# Patient Record
Sex: Female | Born: 1970
Health system: Southern US, Community
[De-identification: ages and names within clinical notes are randomized; demographics above are authoritative.]

## PROBLEM LIST (undated history)

## (undated) DIAGNOSIS — K219 Gastro-esophageal reflux disease without esophagitis: Secondary | ICD-10-CM

## (undated) DIAGNOSIS — B019 Varicella without complication: Secondary | ICD-10-CM

## (undated) DIAGNOSIS — I10 Essential (primary) hypertension: Secondary | ICD-10-CM

## (undated) DIAGNOSIS — N390 Urinary tract infection, site not specified: Secondary | ICD-10-CM

## (undated) DIAGNOSIS — E785 Hyperlipidemia, unspecified: Secondary | ICD-10-CM

## (undated) HISTORY — DX: Gastro-esophageal reflux disease without esophagitis: K21.9

## (undated) HISTORY — DX: Urinary tract infection, site not specified: N39.0

## (undated) HISTORY — DX: Essential (primary) hypertension: I10

## (undated) HISTORY — PX: OTHER SURGICAL HISTORY: SHX169

## (undated) HISTORY — PX: ORIF FEMUR FRACTURE: SHX2119

## (undated) HISTORY — DX: Varicella without complication: B01.9

## (undated) HISTORY — DX: Hyperlipidemia, unspecified: E78.5

## (undated) HISTORY — PX: TUBAL LIGATION: SHX77

---

## 1991-03-24 HISTORY — PX: CHOLECYSTECTOMY: SHX55

## 2003-01-27 ENCOUNTER — Other Ambulatory Visit: Payer: Self-pay

## 2004-10-27 ENCOUNTER — Encounter: Payer: Self-pay | Admitting: Internal Medicine

## 2004-11-21 ENCOUNTER — Encounter: Payer: Self-pay | Admitting: Internal Medicine

## 2004-12-21 ENCOUNTER — Encounter: Payer: Self-pay | Admitting: Internal Medicine

## 2005-01-21 ENCOUNTER — Encounter: Payer: Self-pay | Admitting: Internal Medicine

## 2005-02-20 ENCOUNTER — Ambulatory Visit: Payer: Self-pay

## 2005-03-23 HISTORY — PX: ABLATION: SHX5711

## 2005-04-09 ENCOUNTER — Ambulatory Visit: Payer: Self-pay | Admitting: Obstetrics and Gynecology

## 2005-08-07 ENCOUNTER — Ambulatory Visit: Payer: Self-pay

## 2006-01-12 ENCOUNTER — Ambulatory Visit: Payer: Self-pay | Admitting: Internal Medicine

## 2006-04-08 ENCOUNTER — Encounter: Payer: Self-pay | Admitting: General Practice

## 2006-12-11 ENCOUNTER — Ambulatory Visit: Payer: Self-pay | Admitting: Internal Medicine

## 2007-06-14 ENCOUNTER — Ambulatory Visit: Payer: Self-pay | Admitting: Internal Medicine

## 2010-03-03 ENCOUNTER — Ambulatory Visit: Payer: Self-pay | Admitting: Internal Medicine

## 2010-03-11 ENCOUNTER — Ambulatory Visit: Payer: Self-pay | Admitting: Internal Medicine

## 2010-07-04 LAB — CBC AND DIFFERENTIAL: Neutrophils Absolute: 67 /uL

## 2010-07-04 LAB — BASIC METABOLIC PANEL
BUN: 13 mg/dL (ref 4–21)
Creatinine: 0.7 mg/dL (ref 0.5–1.1)
Sodium: 140 mmol/L (ref 137–147)

## 2010-07-04 LAB — LIPID PANEL
LDL Cholesterol: 151 mg/dL
Triglycerides: 150 mg/dL (ref 40–160)

## 2010-07-04 LAB — HEPATIC FUNCTION PANEL: Bilirubin, Total: 0.5 mg/dL

## 2010-11-10 ENCOUNTER — Encounter: Payer: Self-pay | Admitting: Internal Medicine

## 2010-11-10 DIAGNOSIS — E1110 Type 2 diabetes mellitus with ketoacidosis without coma: Secondary | ICD-10-CM | POA: Insufficient documentation

## 2010-11-10 DIAGNOSIS — E785 Hyperlipidemia, unspecified: Secondary | ICD-10-CM

## 2010-11-10 DIAGNOSIS — E119 Type 2 diabetes mellitus without complications: Secondary | ICD-10-CM | POA: Insufficient documentation

## 2010-11-14 ENCOUNTER — Encounter: Payer: Self-pay | Admitting: Internal Medicine

## 2010-11-26 ENCOUNTER — Ambulatory Visit: Payer: Self-pay | Admitting: Internal Medicine

## 2010-11-26 DIAGNOSIS — Z0289 Encounter for other administrative examinations: Secondary | ICD-10-CM

## 2011-03-18 ENCOUNTER — Other Ambulatory Visit: Payer: Self-pay | Admitting: *Deleted

## 2011-03-18 MED ORDER — RANITIDINE HCL 300 MG PO CAPS: 300.0000 mg | ORAL_CAPSULE | Freq: Every evening | ORAL | Status: DC

## 2011-03-18 MED ORDER — CARVEDILOL 3.125 MG PO TABS: 3.1250 mg | ORAL_TABLET | Freq: Two times a day (BID) | ORAL | Status: DC

## 2011-03-18 MED ORDER — QUINAPRIL HCL 20 MG PO TABS: 20.0000 mg | ORAL_TABLET | Freq: Every day | ORAL | Status: DC

## 2011-04-24 ENCOUNTER — Ambulatory Visit: Payer: Self-pay | Admitting: Internal Medicine

## 2011-04-24 LAB — HM PAP SMEAR: HM Pap smear: NORMAL

## 2011-05-07 ENCOUNTER — Ambulatory Visit: Payer: Self-pay | Admitting: Internal Medicine

## 2011-05-07 DIAGNOSIS — Z0289 Encounter for other administrative examinations: Secondary | ICD-10-CM

## 2011-07-09 ENCOUNTER — Emergency Department: Payer: Self-pay | Admitting: Emergency Medicine

## 2011-07-09 LAB — CBC
MCH: 29.3 pg (ref 26.0–34.0)
Platelet: 272 10*3/uL (ref 150–440)
RBC: 4.99 10*6/uL (ref 3.80–5.20)

## 2011-07-09 LAB — URINALYSIS, COMPLETE
Bacteria: NONE SEEN
Bilirubin,UR: NEGATIVE
Ketone: NEGATIVE
Nitrite: NEGATIVE
Protein: NEGATIVE
Specific Gravity: 1.022 (ref 1.003–1.030)
WBC UR: <1 /HPF (ref 0–5)

## 2011-07-09 LAB — COMPREHENSIVE METABOLIC PANEL
Albumin: 4.5 g/dL (ref 3.4–5.0)
Bilirubin,Total: 0.9 mg/dL (ref 0.2–1.0)
Creatinine: 0.78 mg/dL (ref 0.60–1.30)
EGFR (Non-African Amer.): >60
Glucose: 252 mg/dL — ABNORMAL HIGH (ref 65–99)
SGOT(AST): 25 U/L (ref 15–37)
Sodium: 134 mmol/L — ABNORMAL LOW (ref 136–145)
Total Protein: 8.6 g/dL — ABNORMAL HIGH (ref 6.4–8.2)

## 2011-07-09 LAB — TROPONIN I: Troponin-I: <0.02 ng/mL

## 2011-11-26 ENCOUNTER — Ambulatory Visit: Payer: Self-pay | Admitting: Internal Medicine

## 2011-11-26 DIAGNOSIS — Z0289 Encounter for other administrative examinations: Secondary | ICD-10-CM

## 2011-11-29 ENCOUNTER — Emergency Department: Payer: Self-pay | Admitting: Internal Medicine

## 2011-11-29 LAB — CBC
HCT: 41.3 % (ref 35.0–47.0)
HGB: 14.1 g/dL (ref 12.0–16.0)
MCH: 30.2 pg (ref 26.0–34.0)
MCHC: 34.2 g/dL (ref 32.0–36.0)
Platelet: 233 10*3/uL (ref 150–440)
RBC: 4.69 10*6/uL (ref 3.80–5.20)
WBC: 13.9 10*3/uL — ABNORMAL HIGH (ref 3.6–11.0)

## 2011-11-29 LAB — COMPREHENSIVE METABOLIC PANEL
Albumin: 3.9 g/dL (ref 3.4–5.0)
Anion Gap: 9 (ref 7–16)
Bilirubin,Total: 0.6 mg/dL (ref 0.2–1.0)
Chloride: 104 mmol/L (ref 98–107)
Co2: 25 mmol/L (ref 21–32)
Creatinine: 0.69 mg/dL (ref 0.60–1.30)
EGFR (African American): >60
EGFR (Non-African Amer.): >60
Osmolality: 279 (ref 275–301)
Sodium: 138 mmol/L (ref 136–145)
Total Protein: 8 g/dL (ref 6.4–8.2)

## 2011-11-29 LAB — CK TOTAL AND CKMB (NOT AT ARMC)
CK, Total: 85 U/L (ref 21–215)
CK-MB: <0.5 ng/mL — ABNORMAL LOW (ref 0.5–3.6)

## 2011-11-30 LAB — URINALYSIS, COMPLETE
Bilirubin,UR: NEGATIVE
Blood: NEGATIVE
Glucose,UR: 50 mg/dL (ref 0–75)
Leukocyte Esterase: NEGATIVE
Nitrite: NEGATIVE
Ph: 5 (ref 4.5–8.0)
Protein: NEGATIVE
RBC,UR: 1 /HPF (ref 0–5)
Specific Gravity: 1.03 (ref 1.003–1.030)
Squamous Epithelial: 11
WBC UR: 6 /HPF (ref 0–5)

## 2011-11-30 LAB — TROPONIN I: Troponin-I: <0.02 ng/mL

## 2011-12-01 LAB — URINE CULTURE

## 2012-01-22 ENCOUNTER — Encounter: Payer: Self-pay | Admitting: Internal Medicine

## 2012-01-22 ENCOUNTER — Ambulatory Visit (INDEPENDENT_AMBULATORY_CARE_PROVIDER_SITE_OTHER): Payer: PRIVATE HEALTH INSURANCE | Admitting: Internal Medicine

## 2012-01-22 ENCOUNTER — Telehealth: Payer: Self-pay | Admitting: Internal Medicine

## 2012-01-22 VITALS — BP 120/82 | HR 87 | Temp 98.3°F | Ht 70.0 in | Wt 247.6 lb

## 2012-01-22 DIAGNOSIS — R300 Dysuria: Secondary | ICD-10-CM

## 2012-01-22 DIAGNOSIS — Z1239 Encounter for other screening for malignant neoplasm of breast: Secondary | ICD-10-CM

## 2012-01-22 DIAGNOSIS — E785 Hyperlipidemia, unspecified: Secondary | ICD-10-CM

## 2012-01-22 DIAGNOSIS — E119 Type 2 diabetes mellitus without complications: Secondary | ICD-10-CM

## 2012-01-22 LAB — POCT URINALYSIS DIPSTICK
Bilirubin, UA: NEGATIVE
Leukocytes, UA: NEGATIVE
Nitrite, UA: NEGATIVE

## 2012-01-22 NOTE — Telephone Encounter (Signed)
I have reviewed CT of the chest from 11/30/2011. CT was normal except for mild changes in the bilateral lungs consistent with atelectasis (poor inspiration during exam), or possible infection at the time. Nothing to explain "twitching" sensation.

## 2012-01-23 DIAGNOSIS — R300 Dysuria: Secondary | ICD-10-CM | POA: Insufficient documentation

## 2012-01-23 NOTE — Assessment & Plan Note (Signed)
Blood sugars well controlled per pt. Followed by employee health at [facility] and A1c typically 6.5-7%.  Encouraged continued efforts at BG control. Will continue current medications. Follow up 3 months.

## 2012-01-23 NOTE — Progress Notes (Signed)
Subjective:    Patient ID: Julia Brown, female    DOB: Jun 24, 1970, 41 y.o.   MRN: [id number]  HPI 41 year old female with history of diabetes, hypertension, hyperlipidemia presents for followup. She has been lost to followup for over one year. Her diabetes has been followed by employee health at [facility]. She reports she has been compliant with medications an A1c is typically been near 7%. She has tried to follow a healthy diet and maintain physical activity. Her other concern today is some intermittent episodes of dysuria and increased urinary frequency. She denies any fever, chills, flank pain.  Outpatient Encounter Prescriptions as of 01/22/2012  Medication Sig Dispense Refill  . carvedilol (COREG) 3.125 MG tablet Take 1 tablet (3.125 mg total) by mouth 2 (two) times daily with a meal.  180 tablet  1  . Insulin Glargine (LANTUS SOLOSTAR [place]) Inject 50 Units into the skin daily. 40      . Liraglutide (VICTOZA) 18 MG/3ML SOLN Inject 1.8 mLs into the skin daily.      . metFORMIN (GLUCOPHAGE) 500 MG tablet Take 500 mg by mouth 2 (two) times daily with a meal.        . quinapril (ACCUPRIL) 20 MG tablet Take 1 tablet (20 mg total) by mouth at bedtime.  90 tablet  1  . ranitidine (ZANTAC) 300 MG capsule Take 1 capsule (300 mg total) by mouth every evening.  90 capsule  1  . OMEGA 3 1000 MG CAPS Take 1,000 mg by mouth. 1 qam, 2qpm       . DISCONTD: exenatide (BYETTA 10 MCG PEN) 10 MCG/0.04ML SOLN Inject into the skin 2 (two) times daily with a meal.         BP 120/82  Pulse 87  Temp 98.3 F (36.8 C) (Oral)  Ht 5\' 10"  (1.778 m)  Wt 247 lb 10 oz (112.322 kg)  BMI 35.53 kg/m2  SpO2 99%  LMP 01/22/2011  Review of Systems  Constitutional: Negative for fever, chills, appetite change, fatigue and unexpected weight change.  HENT: Negative for ear pain, congestion, sore throat, trouble swallowing, neck pain, voice change and sinus pressure.   Eyes: Negative for visual disturbance.    Respiratory: Negative for cough, shortness of breath, wheezing and stridor.   Cardiovascular: Negative for chest pain, palpitations and leg swelling.  Gastrointestinal: Negative for nausea, vomiting, abdominal pain, diarrhea, constipation, blood in stool, abdominal distention and anal bleeding.  Genitourinary: Positive for dysuria and urgency. Negative for flank pain.  Musculoskeletal: Negative for myalgias, arthralgias and gait problem.  Skin: Negative for color change and rash.  Neurological: Negative for dizziness and headaches.  Hematological: Negative for adenopathy. Does not bruise/bleed easily.  Psychiatric/Behavioral: Negative for suicidal ideas, disturbed wake/sleep cycle and dysphoric mood. The patient is not nervous/anxious.        Objective:   Physical Exam  Constitutional: She is oriented to person, place, and time. She appears well-developed and well-nourished. No distress.  HENT:  Head: Normocephalic and atraumatic.  Right Ear: External ear normal.  Left Ear: External ear normal.  Nose: Nose normal.  Mouth/Throat: Oropharynx is clear and moist. No oropharyngeal exudate.  Eyes: Conjunctivae normal are normal. Pupils are equal, round, and reactive to light. Right eye exhibits no discharge. Left eye exhibits no discharge. No scleral icterus.  Neck: Normal range of motion. Neck supple. No tracheal deviation present. No thyromegaly present.  Cardiovascular: Normal rate, regular rhythm, normal heart sounds and intact distal pulses.  Exam reveals no  gallop and no friction rub.   No murmur heard. Pulmonary/Chest: Effort normal and breath sounds normal. No respiratory distress. She has no wheezes. She has no rales. She exhibits no tenderness.  Abdominal: Soft. Bowel sounds are normal. She exhibits no distension. There is no tenderness.  Musculoskeletal: Normal range of motion. She exhibits no edema and no tenderness.  Lymphadenopathy:    She has no cervical adenopathy.   Neurological: She is alert and oriented to person, place, and time. No cranial nerve deficit. She exhibits normal muscle tone. Coordination normal.  Skin: Skin is warm and dry. No rash noted. She is not diaphoretic. No erythema. No pallor.  Psychiatric: She has a normal mood and affect. Her behavior is normal. Judgment and thought content normal.          Assessment & Plan:

## 2012-01-23 NOTE — Assessment & Plan Note (Signed)
Patient notes occasional urinary frequency. Urinalysis today is negative. However, will send urine for culture.

## 2012-01-23 NOTE — Assessment & Plan Note (Signed)
Patient has been intolerant of statin medications. Recent cholesterol performed at [facility] showed improvement. Encourage continued efforts at healthy diet and regular physical activity.

## 2012-01-24 LAB — URINE CULTURE: Colony Count: 60000

## 2012-01-29 ENCOUNTER — Other Ambulatory Visit: Payer: PRIVATE HEALTH INSURANCE

## 2012-03-28 ENCOUNTER — Telehealth: Payer: Self-pay | Admitting: Internal Medicine

## 2012-03-28 NOTE — Telephone Encounter (Signed)
Left message on cell phone voicemail for patient to return call. 

## 2012-03-28 NOTE — Telephone Encounter (Signed)
Needing samples

## 2012-03-29 NOTE — Telephone Encounter (Signed)
Patient advised we have no samples of Lantus at this time.

## 2012-04-18 ENCOUNTER — Encounter: Payer: Self-pay | Admitting: Internal Medicine

## 2012-04-18 ENCOUNTER — Ambulatory Visit (INDEPENDENT_AMBULATORY_CARE_PROVIDER_SITE_OTHER): Payer: 59 | Admitting: Internal Medicine

## 2012-04-18 ENCOUNTER — Other Ambulatory Visit (HOSPITAL_COMMUNITY)
Admission: RE | Admit: 2012-04-18 | Discharge: 2012-04-18 | Disposition: A | Discharge status: Home / Self Care | Payer: 59 | Source: Ambulatory Visit | Attending: Internal Medicine | Admitting: Internal Medicine

## 2012-04-18 VITALS — BP 126/80 | HR 84 | Temp 97.8°F | Ht 69.75 in | Wt 246.0 lb

## 2012-04-18 DIAGNOSIS — Z1151 Encounter for screening for human papillomavirus (HPV): Secondary | ICD-10-CM | POA: Insufficient documentation

## 2012-04-18 DIAGNOSIS — Z0000 Encounter for general adult medical examination without abnormal findings: Secondary | ICD-10-CM | POA: Insufficient documentation

## 2012-04-18 DIAGNOSIS — R1012 Left upper quadrant pain: Secondary | ICD-10-CM

## 2012-04-18 DIAGNOSIS — Z01419 Encounter for gynecological examination (general) (routine) without abnormal findings: Secondary | ICD-10-CM | POA: Insufficient documentation

## 2012-04-18 DIAGNOSIS — E119 Type 2 diabetes mellitus without complications: Secondary | ICD-10-CM

## 2012-04-18 NOTE — Assessment & Plan Note (Signed)
Symptoms most consistent with ventral wall hernia, however nodular area palpated just below costal margin left chest wall. Will get [place] for evaluation.
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
## 2012-04-18 NOTE — Assessment & Plan Note (Signed)
Will check A1c with labs, drawn through employer. Continue current medications. Follow up 3 months and prn.

## 2012-04-18 NOTE — Assessment & Plan Note (Signed)
General medical exam normal today including breast and pelvic exam. PAP pending. Will check basic labs including CBC CMP, lipids, A1c with labs through her employer. Vaccinations UTD. Encouraged healthy diet and regular physical activity. Follow up 3 months and prn.

## 2012-04-19 ENCOUNTER — Ambulatory Visit: Payer: Self-pay | Admitting: Internal Medicine

## 2012-04-20 ENCOUNTER — Other Ambulatory Visit: Payer: Self-pay | Admitting: *Deleted

## 2012-04-20 ENCOUNTER — Telehealth: Payer: Self-pay | Admitting: Internal Medicine

## 2012-04-20 DIAGNOSIS — R1012 Left upper quadrant pain: Secondary | ICD-10-CM

## 2012-04-20 MED ORDER — RANITIDINE HCL 300 MG PO CAPS: 300.0000 mg | ORAL_CAPSULE | Freq: Every evening | ORAL | Status: DC

## 2012-04-20 MED ORDER — CARVEDILOL 3.125 MG PO TABS: 3.1250 mg | ORAL_TABLET | Freq: Two times a day (BID) | ORAL | Status: DC

## 2012-04-20 MED ORDER — QUINAPRIL HCL 20 MG PO TABS: 20.0000 mg | ORAL_TABLET | Freq: Every day | ORAL | Status: DC

## 2012-04-20 NOTE — Telephone Encounter (Signed)
US abdomen normal. Rec proceed with CT Chest and abdomen for further evaluation.

## 2012-04-21 NOTE — Telephone Encounter (Signed)
I will place order for CT chest and abdomen, can you please schedule?

## 2012-04-21 NOTE — Telephone Encounter (Signed)
Spoke with patient and advised results, pt would like to proceed with CT, please advise

## 2012-04-21 NOTE — Addendum Note (Signed)
Addended by: Ronna Polio A on: 04/21/2012 03:53 PM   Modules accepted: Orders

## 2012-04-27 ENCOUNTER — Encounter: Payer: Self-pay | Admitting: Internal Medicine

## 2012-06-12 LAB — HM DIABETES EYE EXAM: HM Diabetic Eye Exam: NORMAL

## 2012-06-12 LAB — HM DIABETES FOOT EXAM: HM Diabetic Foot Exam: NORMAL

## 2012-07-19 ENCOUNTER — Ambulatory Visit: Payer: 59 | Admitting: Internal Medicine

## 2012-07-28 LAB — HM MAMMOGRAPHY

## 2012-08-10 ENCOUNTER — Ambulatory Visit: Payer: Self-pay | Admitting: Internal Medicine

## 2012-08-12 ENCOUNTER — Ambulatory Visit (INDEPENDENT_AMBULATORY_CARE_PROVIDER_SITE_OTHER): Payer: 59 | Admitting: Internal Medicine

## 2012-08-12 ENCOUNTER — Encounter: Payer: Self-pay | Admitting: Internal Medicine

## 2012-08-12 VITALS — BP 140/98 | HR 94 | Temp 98.4°F | Wt 248.0 lb

## 2012-08-12 DIAGNOSIS — K219 Gastro-esophageal reflux disease without esophagitis: Secondary | ICD-10-CM | POA: Insufficient documentation

## 2012-08-12 DIAGNOSIS — L659 Nonscarring hair loss, unspecified: Secondary | ICD-10-CM | POA: Insufficient documentation

## 2012-08-12 DIAGNOSIS — E119 Type 2 diabetes mellitus without complications: Secondary | ICD-10-CM

## 2012-08-12 DIAGNOSIS — E785 Hyperlipidemia, unspecified: Secondary | ICD-10-CM

## 2012-08-12 MED ORDER — OMEPRAZOLE 40 MG PO CPDR: 40.0000 mg | DELAYED_RELEASE_CAPSULE | Freq: Every day | ORAL | Status: DC

## 2012-08-12 NOTE — Assessment & Plan Note (Signed)
Blood sugar slightly elevated compared to previous. Discussed changing Lantus to 25 units bid for better control. Pt will monitor BG on this regimen and if still elevated with BG >200, she will increase to 30units bid. She will lower dose of Victoza to 0.6mg  daily because of nausea. She will call if symptoms of nausea not improving with lower dose. Continue metformin. Follow up 4 weeks.

## 2012-08-12 NOTE — Progress Notes (Signed)
Subjective:    Patient ID: Julia Brown, female    DOB: [birth year], 42 y.o.   MRN: [id number]  HPI 42 year old female with history of diabetes, obesity, hypertension, hyperlipidemia presents for followup.  DM- recent A1c elevated at 7.9%. Patient reports compliance with medications. She notes some nausea with the use of Victoza. No vomiting or persistent abdominal pain. She notes that morning blood sugars have been elevated, typically greater than 200. She typically takes her dose of Lantus in the morning.  HTN - BP generally well controlled. Compliant with Accupril.  GERD - symptoms of reflux and epigastric pain have been refractory to use of ranitidine. Pt notes symptoms of epigastric burning and occasional nausea. No change in bowel habits. No change in appetite.  Hair loss - patient notes hair loss over the top of her scalp. She reports history of trauma to this area from "microbraids" and use of weave. She denies any pain or irritation of the scalp. She questions if any topical treatment might help with hair growth.  Outpatient Encounter Prescriptions as of 08/12/2012  Medication Sig Dispense Refill  . carvedilol (COREG) 3.125 MG tablet Take 1 tablet (3.125 mg total) by mouth 2 (two) times daily with a meal.  180 tablet  1  . Insulin Glargine (LANTUS SOLOSTAR [place]) Inject 50 Units into the skin daily. 40      . Liraglutide (VICTOZA) 18 MG/3ML SOLN Inject 1.8 mLs into the skin daily.      . metFORMIN (GLUCOPHAGE) 500 MG tablet Take 500 mg by mouth 2 (two) times daily with a meal.        . OMEGA 3 1000 MG CAPS Take 1,000 mg by mouth. 1 qam, 2qpm       . quinapril (ACCUPRIL) 20 MG tablet Take 1 tablet (20 mg total) by mouth at bedtime.  90 tablet  1  . ranitidine (ZANTAC) 300 MG capsule Take 1 capsule (300 mg total) by mouth every evening.  90 capsule  1  . Vitamin D, Ergocalciferol, (DRISDOL) 50000 UNITS CAPS Take 50,000 Units by mouth every 7 (seven) days.      Marland Kitchen omeprazole (PRILOSEC) 40  MG capsule Take 1 capsule (40 mg total) by mouth daily.  30 capsule  3   No facility-administered encounter medications on file as of 08/12/2012.   BP 140/98  Pulse 94  Temp(Src) 98.4 F (36.9 C) (Oral)  Wt 248 lb (112.492 kg)  BMI 35.83 kg/m2  SpO2 99%  Review of Systems  Constitutional: Negative for fever, chills, appetite change, fatigue and unexpected weight change.  HENT: Negative for ear pain, congestion, sore throat, trouble swallowing, neck pain, voice change and sinus pressure.   Eyes: Negative for visual disturbance.  Respiratory: Negative for cough, shortness of breath, wheezing and stridor.   Cardiovascular: Negative for chest pain, palpitations and leg swelling.  Gastrointestinal: Positive for abdominal pain. Negative for nausea, vomiting, diarrhea, constipation, blood in stool, abdominal distention and anal bleeding.  Genitourinary: Negative for dysuria and flank pain.  Musculoskeletal: Negative for myalgias, arthralgias and gait problem.  Skin: Negative for color change and rash.  Neurological: Negative for dizziness and headaches.  Hematological: Negative for adenopathy. Does not bruise/bleed easily.  Psychiatric/Behavioral: Negative for suicidal ideas, sleep disturbance and dysphoric mood. The patient is not nervous/anxious.        Objective:   Physical Exam  Constitutional: She is oriented to person, place, and time. She appears well-developed and well-nourished. No distress.  HENT:  Head: Normocephalic and atraumatic.  Right Ear: External ear normal.  Left Ear: External ear normal.  Nose: Nose normal.  Mouth/Throat: Oropharynx is clear and moist. No oropharyngeal exudate.  Eyes: Conjunctivae are normal. Pupils are equal, round, and reactive to light. Right eye exhibits no discharge. Left eye exhibits no discharge. No scleral icterus.  Neck: Normal range of motion. Neck supple. No tracheal deviation present. No thyromegaly present.  Cardiovascular: Normal rate,  regular rhythm, normal heart sounds and intact distal pulses.  Exam reveals no gallop and no friction rub.   No murmur heard. Pulmonary/Chest: Effort normal and breath sounds normal. No accessory muscle usage. Not tachypneic. No respiratory distress. She has no decreased breath sounds. She has no wheezes. She has no rhonchi. She has no rales. She exhibits no tenderness.  Abdominal: Soft. Bowel sounds are normal. She exhibits no distension. There is no tenderness.  Musculoskeletal: Normal range of motion. She exhibits no edema and no tenderness.  Lymphadenopathy:    She has no cervical adenopathy.  Neurological: She is alert and oriented to person, place, and time. No cranial nerve deficit. She exhibits normal muscle tone. Coordination normal.  Skin: Skin is warm and dry. No rash noted. She is not diaphoretic. No erythema. No pallor.  Psychiatric: She has a normal mood and affect. Her behavior is normal. Judgment and thought content normal.          Assessment & Plan:

## 2012-08-12 NOTE — Assessment & Plan Note (Signed)
Symptoms of GERD refractory to Zantac. Will try changing to Omeprazole. If no improvement, discussed using Dexilant. Also discussed testing for H. Pylori if symptoms persistent. Follow up 4 weeks.

## 2012-08-12 NOTE — Assessment & Plan Note (Signed)
Recent hair loss likely related to trauma to hair follicles. Will set up dermatology evaluation.

## 2012-09-02 ENCOUNTER — Encounter: Payer: Self-pay | Admitting: Internal Medicine

## 2012-09-20 ENCOUNTER — Ambulatory Visit: Payer: 59 | Admitting: Internal Medicine

## 2012-11-15 ENCOUNTER — Encounter: Payer: Self-pay | Admitting: *Deleted

## 2013-03-14 ENCOUNTER — Ambulatory Visit: Payer: 59 | Admitting: Internal Medicine

## 2013-04-10 ENCOUNTER — Other Ambulatory Visit: Payer: Self-pay | Admitting: Internal Medicine

## 2013-08-23 ENCOUNTER — Ambulatory Visit: Payer: 59 | Admitting: Adult Health

## 2013-08-28 ENCOUNTER — Ambulatory Visit (INDEPENDENT_AMBULATORY_CARE_PROVIDER_SITE_OTHER): Payer: 59 | Admitting: Internal Medicine

## 2013-08-28 ENCOUNTER — Encounter: Payer: Self-pay | Admitting: Internal Medicine

## 2013-08-28 VITALS — BP 140/90 | HR 82 | Temp 98.2°F | Ht 70.0 in | Wt 256.0 lb

## 2013-08-28 DIAGNOSIS — F17200 Nicotine dependence, unspecified, uncomplicated: Secondary | ICD-10-CM

## 2013-08-28 DIAGNOSIS — M5431 Sciatica, right side: Secondary | ICD-10-CM | POA: Insufficient documentation

## 2013-08-28 DIAGNOSIS — E669 Obesity, unspecified: Secondary | ICD-10-CM | POA: Insufficient documentation

## 2013-08-28 DIAGNOSIS — M5430 Sciatica, unspecified side: Secondary | ICD-10-CM

## 2013-08-28 DIAGNOSIS — Z0000 Encounter for general adult medical examination without abnormal findings: Secondary | ICD-10-CM

## 2013-08-28 DIAGNOSIS — Z720 Tobacco use: Secondary | ICD-10-CM | POA: Insufficient documentation

## 2013-08-28 DIAGNOSIS — R102 Pelvic and perineal pain: Secondary | ICD-10-CM

## 2013-08-28 DIAGNOSIS — F4323 Adjustment disorder with mixed anxiety and depressed mood: Secondary | ICD-10-CM | POA: Insufficient documentation

## 2013-08-28 DIAGNOSIS — E119 Type 2 diabetes mellitus without complications: Secondary | ICD-10-CM

## 2013-08-28 MED ORDER — FLUOXETINE HCL 20 MG PO TABS: 20.0000 mg | ORAL_TABLET | Freq: Every day | ORAL | Status: DC

## 2013-08-28 NOTE — Assessment & Plan Note (Signed)
Right sided sciatic pain. Discussed starting prednisone taper, versus sports med evaluation with possible local steroid injection. Will set up evaluation with Dr. Katrinka Blazing.

## 2013-08-28 NOTE — Assessment & Plan Note (Signed)
BG well controlled per her report. Will check A1c with labs today. Continue current medicine. Foot exam normal today.

## 2013-08-28 NOTE — Assessment & Plan Note (Signed)
Wt Readings from Last 3 Encounters:  08/28/13 256 lb (116.121 kg)  08/12/12 248 lb (112.492 kg)  04/18/12 246 lb (111.585 kg)   Body mass index is 36.73 kg/(m^2). Encouraged healthy diet and exercise with goal of weight loss.

## 2013-08-28 NOTE — Assessment & Plan Note (Signed)
Symptoms of anxiety and depression. Offered support today. Discussed counseling, but she does not have time for this at present. Will start Fluoxetine 20mg  daily. Follow up in 4 weeks or sooner as needed.

## 2013-08-28 NOTE — Progress Notes (Signed)
Pre visit review using our clinic review tool, if applicable. No additional management support is needed unless otherwise documented below in the visit note. 

## 2013-08-28 NOTE — Assessment & Plan Note (Signed)
Strongly encouraged smoking cessation. Treating depression and anxiety as noted in effort to help pt with efforts at quitting.

## 2013-08-28 NOTE — Assessment & Plan Note (Signed)
General medical exam normal today including breast exam. PAP and pelvic deferred as PAP normal HPV neg in 2014. Encouraged healthy diet and exercise, with goal of 3x per week. Encouraged smoking cessation. Immunizations are UTD.

## 2013-08-28 NOTE — Progress Notes (Signed)
Subjective:    Patient ID: Julia Brown, female    DOB: 10/31/1970, 43 y.o.   MRN: [id number]  HPI 43YO female presents for annual exam. She has several concerns today.  Right sciatic pain over last few months. Described as sharp pain in lower back that radiates down back of right leg. Taking Mobic on occasion with some improvement. No weakness or numbness noted. No trauma to back or leg.  Depression and anxiety - increased symptoms of depressed mood, difficulty sleeping and anxiety over last several months. Trying to manage her mother, who has scizophrenia, and lives in [place]. Also has some issues with husband, who had child by affair outside of marriage. Trying to financially support 4 children. Working full time and taking overtime hours.  PCOS - has h/o PCOS. Last US pelvis several years ago. Occasionally has sharp pain bilateral pelvis, Questions whether this may be related to ovarian cysts. No persistent pain. No GI symptoms. No vaginal discharge, pain.  DM - BG well controlled by report, however she did not bring record today. Compliant with meds. NO BG >200.  Last PAP 2014 normal, HPV neg. Mammogram scheduled.   Review of Systems  Constitutional: Negative for fever, chills, appetite change, fatigue and unexpected weight change.  HENT: Negative for congestion, ear pain, sinus pressure, sore throat, trouble swallowing and voice change.   Eyes: Negative for visual disturbance.  Respiratory: Negative for cough, shortness of breath, wheezing and stridor.   Cardiovascular: Negative for chest pain, palpitations and leg swelling.  Gastrointestinal: Negative for nausea, vomiting, abdominal pain, diarrhea, constipation, blood in stool, abdominal distention and anal bleeding.  Genitourinary: Positive for pelvic pain. Negative for dysuria, urgency, flank pain, vaginal bleeding, vaginal discharge, genital sores, vaginal pain, menstrual problem and dyspareunia.  Musculoskeletal: Negative for  arthralgias, gait problem, myalgias and neck pain.  Skin: Negative for color change and rash.  Neurological: Negative for dizziness and headaches.  Hematological: Negative for adenopathy. Does not bruise/bleed easily.  Psychiatric/Behavioral: Positive for sleep disturbance and dysphoric mood. Negative for suicidal ideas. The patient is nervous/anxious.        Objective:    BP 140/90  Pulse 82  Temp(Src) 98.2 F (36.8 C) (Oral)  Ht 5\' 10"  (1.778 m)  Wt 256 lb (116.121 kg)  BMI 36.73 kg/m2  SpO2 98% Physical Exam  Constitutional: She is oriented to person, place, and time. She appears well-developed and well-nourished. No distress.  HENT:  Head: Normocephalic and atraumatic.  Right Ear: External ear normal.  Left Ear: External ear normal.  Nose: Nose normal.  Mouth/Throat: Oropharynx is clear and moist. No oropharyngeal exudate.  Eyes: Conjunctivae are normal. Pupils are equal, round, and reactive to light. Right eye exhibits no discharge. Left eye exhibits no discharge. No scleral icterus.  Neck: Normal range of motion. Neck supple. No tracheal deviation present. No thyromegaly present.  Cardiovascular: Normal rate, regular rhythm, normal heart sounds and intact distal pulses.  Exam reveals no gallop and no friction rub.   No murmur heard. Pulmonary/Chest: Effort normal and breath sounds normal. No accessory muscle usage. Not tachypneic. No respiratory distress. She has no decreased breath sounds. She has no wheezes. She has no rales. She exhibits no tenderness. Right breast exhibits no inverted nipple, no mass, no nipple discharge, no skin change and no tenderness. Left breast exhibits no inverted nipple, no mass, no nipple discharge, no skin change and no tenderness. Breasts are symmetrical.  Abdominal: Soft. Bowel sounds are normal. She exhibits no distension  and no mass. There is no tenderness. There is no rebound and no guarding.  Musculoskeletal: Normal range of motion. She  exhibits no edema and no tenderness.  Lymphadenopathy:    She has no cervical adenopathy.  Neurological: She is alert and oriented to person, place, and time. No cranial nerve deficit. She exhibits normal muscle tone. Coordination normal.  Skin: Skin is warm and dry. No rash noted. She is not diaphoretic. No erythema. No pallor.  Psychiatric: Her speech is normal and behavior is normal. Judgment and thought content normal. Her mood appears anxious. Cognition and memory are normal. She exhibits a depressed mood. She expresses no suicidal ideation.          Assessment & Plan:   Problem List Items Addressed This Visit     Unprioritized   Adjustment disorder with mixed anxiety and depressed mood     Symptoms of anxiety and depression. Offered support today. Discussed counseling, but she does not have time for this at present. Will start Fluoxetine 20mg  daily. Follow up in 4 weeks or sooner as needed.    Relevant Medications      FLUoxetine (PROZAC) tablet   Diabetes type 2, controlled     BG well controlled per her report. Will check A1c with labs today. Continue current medicine. Foot exam normal today.    Obesity (BMI 30-39.9)      Wt Readings from Last 3 Encounters:  08/28/13 256 lb (116.121 kg)  08/12/12 248 lb (112.492 kg)  04/18/12 246 lb (111.585 kg)   Body mass index is 36.73 kg/(m^2). Encouraged healthy diet and exercise with goal of weight loss.    Pelvic pain     Occasional bilateral pelvic pain. H/o PCOS. Exam normal today. Will set up pelvic US for further evaluation.    Relevant Orders      US Transvaginal Non-OB   Right sciatic nerve pain     Right sided sciatic pain. Discussed starting prednisone taper, versus sports med evaluation with possible local steroid injection. Will set up evaluation with Dr. Katrinka Blazing.    Relevant Medications      FLUoxetine (PROZAC) tablet   Other Relevant Orders      Ambulatory referral to Sports Medicine   Routine general medical  examination at a health care facility - Primary     General medical exam normal today including breast exam. PAP and pelvic deferred as PAP normal HPV neg in 2014. Encouraged healthy diet and exercise, with goal of 3x per week. Encouraged smoking cessation. Immunizations are UTD.    Tobacco abuse     Strongly encouraged smoking cessation. Treating depression and anxiety as noted in effort to help pt with efforts at quitting.        Return in about 4 weeks (around 09/25/2013) for Recheck.

## 2013-08-28 NOTE — Assessment & Plan Note (Signed)
Occasional bilateral pelvic pain. H/o PCOS. Exam normal today. Will set up pelvic US for further evaluation.

## 2013-09-01 ENCOUNTER — Other Ambulatory Visit: Payer: Self-pay | Admitting: *Deleted

## 2013-09-01 DIAGNOSIS — R102 Pelvic and perineal pain: Secondary | ICD-10-CM

## 2013-09-04 ENCOUNTER — Ambulatory Visit: Payer: 59 | Admitting: Family Medicine

## 2013-09-15 LAB — BASIC METABOLIC PANEL
BUN: 19 mg/dL (ref 4–21)
Creatinine: 0.8 mg/dL (ref 0.5–1.1)
GLUCOSE: 219 mg/dL
Potassium: 4.9 mmol/L (ref 3.4–5.3)
Sodium: 138 mmol/L (ref 137–147)

## 2013-09-15 LAB — CBC AND DIFFERENTIAL
HCT: 43 % (ref 36–46)
Hemoglobin: 13.9 g/dL (ref 12.0–16.0)
NEUTROS ABS: 8 /uL
Platelets: 323 10*3/uL (ref 150–399)
WBC: 11.3 10^3/mL

## 2013-09-15 LAB — HEPATIC FUNCTION PANEL
ALT: 22 U/L (ref 7–35)
AST: 17 U/L (ref 13–35)
Alkaline Phosphatase: 73 U/L (ref 25–125)
Bilirubin, Total: 0.3 mg/dL

## 2013-09-15 LAB — TSH: TSH: 1.48 u[IU]/mL (ref 0.41–5.90)

## 2013-09-15 LAB — LIPID PANEL
Cholesterol: 250 mg/dL — AB (ref 0–200)
HDL: 57 mg/dL (ref 35–70)
LDL Cholesterol: 146 mg/dL
LDl/HDL Ratio: 4.4
Triglycerides: 233 mg/dL — AB (ref 40–160)

## 2013-09-15 LAB — HEMOGLOBIN A1C: Hgb A1c MFr Bld: 9.1 % — AB (ref 4.0–6.0)

## 2013-09-18 ENCOUNTER — Ambulatory Visit: Payer: 59 | Admitting: Family Medicine

## 2013-09-18 DIAGNOSIS — Z0289 Encounter for other administrative examinations: Secondary | ICD-10-CM

## 2013-09-20 ENCOUNTER — Ambulatory Visit: Payer: Self-pay | Admitting: Internal Medicine

## 2013-09-20 LAB — HM MAMMOGRAPHY: HM MAMMO: NEGATIVE

## 2013-09-21 ENCOUNTER — Encounter: Payer: Self-pay | Admitting: *Deleted

## 2013-09-27 ENCOUNTER — Encounter: Payer: Self-pay | Admitting: Internal Medicine

## 2013-09-27 ENCOUNTER — Ambulatory Visit (INDEPENDENT_AMBULATORY_CARE_PROVIDER_SITE_OTHER): Payer: 59 | Admitting: Internal Medicine

## 2013-09-27 VITALS — BP 120/80 | HR 93 | Temp 98.0°F | Ht 70.0 in | Wt 251.5 lb

## 2013-09-27 DIAGNOSIS — N949 Unspecified condition associated with female genital organs and menstrual cycle: Secondary | ICD-10-CM

## 2013-09-27 DIAGNOSIS — E785 Hyperlipidemia, unspecified: Secondary | ICD-10-CM

## 2013-09-27 DIAGNOSIS — R1012 Left upper quadrant pain: Secondary | ICD-10-CM

## 2013-09-27 DIAGNOSIS — E119 Type 2 diabetes mellitus without complications: Secondary | ICD-10-CM

## 2013-09-27 DIAGNOSIS — F4323 Adjustment disorder with mixed anxiety and depressed mood: Secondary | ICD-10-CM

## 2013-09-27 DIAGNOSIS — R102 Pelvic and perineal pain: Secondary | ICD-10-CM

## 2013-09-27 MED ORDER — INSULIN GLARGINE 100 UNIT/ML SOLOSTAR PEN: 60.0000 [IU] | PEN_INJECTOR | Freq: Every day | SUBCUTANEOUS | Status: DC

## 2013-09-27 MED ORDER — ROSUVASTATIN CALCIUM 5 MG PO TABS: 5.0000 mg | ORAL_TABLET | Freq: Every day | ORAL | Status: DC

## 2013-09-27 NOTE — Assessment & Plan Note (Signed)
Symptoms have improved without medication. Will continue to monitor for now.

## 2013-09-27 NOTE — Assessment & Plan Note (Signed)
LDL elevated. Will start Crestor 5mg  daily. Repeat lipids and lfts in 4 weeks. Call if any myalgia or other problems.

## 2013-09-27 NOTE — Assessment & Plan Note (Signed)
She did not have pelvic US as was out of town. Given new left upper abdominal pain, will change imaging to full CT abdomen and pelvis.

## 2013-09-27 NOTE — Patient Instructions (Addendum)
Start Vit D OTC 2000units daily.  Increase Lantus to 60units daily. Call or email with blood sugars weekly.  Start Crestor 5mg  daily. Call immediately if any muscle pain or other concerns. Recheck labs in 4 weeks.

## 2013-09-27 NOTE — Progress Notes (Signed)
Pre visit review using our clinic review tool, if applicable. No additional management support is needed unless otherwise documented below in the visit note. 

## 2013-09-27 NOTE — Assessment & Plan Note (Addendum)
A1c 9%. Will increase Lantus to 60units daily. Continue Victoza. Continue Metformin. She will email with blood sugar readings weekly and follow up in 3 months.

## 2013-09-27 NOTE — Progress Notes (Signed)
Subjective:    Patient ID: Julia Brown, female    DOB: 11/07/1970, 43 y.o.   MRN: [id number]  HPI 43YO female presents for follow up.  DM - A1c elevated 9%.Compliant with meds. Did not bring record of BG today.  Depression - Did not start Fluoxetine. Taking time for herself. Feels that symptoms have improved. Considering divorce.  Being treated for UTI at present with Cipro. Symptoms improving. No recent, fever, chills, flank pain.  Concerned about left upper abdominal pain ongoing for several months off and on. Described as "pulling" pain like a "baby kicking her." No nausea, vomiting, reflux symptoms. No change in bowel habits.  Review of Systems  Constitutional: Negative for fever, chills, appetite change, fatigue and unexpected weight change.  Eyes: Negative for visual disturbance.  Respiratory: Negative for shortness of breath.   Cardiovascular: Negative for chest pain and leg swelling.  Gastrointestinal: Positive for abdominal pain. Negative for nausea, vomiting, diarrhea, constipation and abdominal distention.  Genitourinary: Positive for pelvic pain (intermittent aching pain in lower pelvis.). Negative for dysuria, urgency, frequency and hematuria.  Skin: Negative for color change and rash.  Hematological: Negative for adenopathy. Does not bruise/bleed easily.  Psychiatric/Behavioral: Negative for suicidal ideas, sleep disturbance and dysphoric mood. The patient is not nervous/anxious.        Objective:    BP 120/80  Pulse 93  Temp(Src) 98 F (36.7 C) (Oral)  Ht 5\' 10"  (1.778 m)  Wt 251 lb 8 oz (114.08 kg)  BMI 36.09 kg/m2  SpO2 97% Physical Exam  Constitutional: She is oriented to person, place, and time. She appears well-developed and well-nourished. No distress.  HENT:  Head: Normocephalic and atraumatic.  Right Ear: External ear normal.  Left Ear: External ear normal.  Nose: Nose normal.  Mouth/Throat: Oropharynx is clear and moist. No oropharyngeal  exudate.  Eyes: Conjunctivae are normal. Pupils are equal, round, and reactive to light. Right eye exhibits no discharge. Left eye exhibits no discharge. No scleral icterus.  Neck: Normal range of motion. Neck supple. No tracheal deviation present. No thyromegaly present.  Cardiovascular: Normal rate, regular rhythm, normal heart sounds and intact distal pulses.  Exam reveals no gallop and no friction rub.   No murmur heard. Pulmonary/Chest: Effort normal and breath sounds normal. No accessory muscle usage. Not tachypneic. No respiratory distress. She has no decreased breath sounds. She has no wheezes. She has no rhonchi. She has no rales. She exhibits no tenderness.  Abdominal: Soft. Bowel sounds are normal. She exhibits no distension and no mass. There is no tenderness. There is no rebound and no guarding.  Musculoskeletal: Normal range of motion. She exhibits no edema and no tenderness.  Lymphadenopathy:    She has no cervical adenopathy.  Neurological: She is alert and oriented to person, place, and time. No cranial nerve deficit. She exhibits normal muscle tone. Coordination normal.  Skin: Skin is warm and dry. No rash noted. She is not diaphoretic. No erythema. No pallor.  Psychiatric: She has a normal mood and affect. Her behavior is normal. Judgment and thought content normal.          Assessment & Plan:   Problem List Items Addressed This Visit     Unprioritized   Adjustment disorder with mixed anxiety and depressed mood     Symptoms have improved without medication. Will continue to monitor for now.    Diabetes type 2, controlled - Primary     A1c 9%. Will increase Lantus to 60units daily.  Continue Victoza. Continue Metformin. She will email with blood sugar readings weekly and follow up in 3 months.    Relevant Medications      LANTUS SOLOSTAR 100 UNIT/ML [place] SOLN      rosuvastatin (CRESTOR) tablet   Female pelvic pain     She did not have pelvic US as was out of town.  Given new left upper abdominal pain, will change imaging to full CT abdomen and pelvis.    Hyperlipidemia     LDL elevated. Will start Crestor 5mg  daily. Repeat lipids and lfts in 4 weeks. Call if any myalgia or other problems.    Relevant Medications      rosuvastatin (CRESTOR) tablet    Other Visit Diagnoses   Abdominal pain, left upper quadrant        Relevant Orders       CT Abdomen Pelvis W Contrast        Return in about 3 months (around 12/28/2013) for Recheck of Diabetes.

## 2013-10-03 ENCOUNTER — Encounter: Payer: Self-pay | Admitting: *Deleted

## 2013-10-03 NOTE — Progress Notes (Signed)
Chart reviewed for diabetic bundle. Pt last seen in office 09/27/13. A1c and LDL completed 09/15/13 with medication adjustments made. BP at that visit 120/80. Has 3 month follow up appt scheduled 01/09/14.

## 2013-10-04 ENCOUNTER — Encounter: Payer: Self-pay | Admitting: Internal Medicine

## 2013-10-09 ENCOUNTER — Ambulatory Visit: Payer: Self-pay | Admitting: Internal Medicine

## 2013-10-10 ENCOUNTER — Telehealth: Payer: Self-pay | Admitting: Internal Medicine

## 2013-10-10 NOTE — Telephone Encounter (Signed)
Notify pt that I reviewed her CT scan given that Dr Dan HumphreysWalker is out of the office.  Her CT scan did not reveal any acute abdominal or pelvic abnormality.  There was mention of a small hiatal hernia. If persistent problems, will need reevaluation.

## 2013-10-11 NOTE — Telephone Encounter (Signed)
Sent my chart with results. 

## 2013-10-23 ENCOUNTER — Encounter: Payer: Self-pay | Admitting: Internal Medicine

## 2013-11-22 ENCOUNTER — Telehealth: Payer: Self-pay | Admitting: Internal Medicine

## 2013-11-22 NOTE — Telephone Encounter (Signed)
Pt called in and stated is going to start working out with a physical trainer and is needing Dr. Dan Humphreys to sign off on a paper for it. Stated she is faxing it over.

## 2014-01-09 ENCOUNTER — Ambulatory Visit (INDEPENDENT_AMBULATORY_CARE_PROVIDER_SITE_OTHER): Payer: 59 | Admitting: Internal Medicine

## 2014-01-09 ENCOUNTER — Encounter: Payer: Self-pay | Admitting: Internal Medicine

## 2014-01-09 VITALS — BP 120/82 | HR 89 | Temp 98.2°F | Ht 70.0 in | Wt 257.0 lb

## 2014-01-09 DIAGNOSIS — E119 Type 2 diabetes mellitus without complications: Secondary | ICD-10-CM

## 2014-01-09 DIAGNOSIS — F4323 Adjustment disorder with mixed anxiety and depressed mood: Secondary | ICD-10-CM

## 2014-01-09 DIAGNOSIS — E669 Obesity, unspecified: Secondary | ICD-10-CM

## 2014-01-09 DIAGNOSIS — E785 Hyperlipidemia, unspecified: Secondary | ICD-10-CM

## 2014-01-09 MED ORDER — ALPRAZOLAM 0.25 MG PO TABS: 0.2500 mg | ORAL_TABLET | Freq: Two times a day (BID) | ORAL | Status: DC | PRN

## 2014-01-09 NOTE — Patient Instructions (Signed)
Labs at your office.  Follow up in 3 months.

## 2014-01-09 NOTE — Progress Notes (Signed)
Pre visit review using our clinic review tool, if applicable. No additional management support is needed unless otherwise documented below in the visit note. 

## 2014-01-09 NOTE — Assessment & Plan Note (Signed)
Will repeat lipids and LFTs with labs today. Continue Crestor. 

## 2014-01-09 NOTE — Assessment & Plan Note (Signed)
Wt Readings from Last 3 Encounters:  01/09/14 257 lb (116.574 kg)  09/27/13 251 lb 8 oz (114.08 kg)  08/28/13 256 lb (116.121 kg)   Body mass index is 36.88 kg/(m^2). Encouraged continued healthy diet and exercise.

## 2014-01-09 NOTE — Assessment & Plan Note (Signed)
Will start prn alprazolam for episodes of severe anxiety. Pt prefers not to take daily medication. Discussed potential risks of alprazolam. Follow up by email and in 3 months.

## 2014-01-09 NOTE — Assessment & Plan Note (Signed)
Will check A1c with labs. Continue current medications. 

## 2014-01-09 NOTE — Progress Notes (Signed)
Subjective:    Patient ID: Julia Brown, female    DOB: 08/30/1970, 43 y.o.   MRN: [id number]  HPI 43YO female presents for follow up.  DM - Started exercise program with daughter. Personal trainer once per week. Also walks on weekends and does cardio class.  BG running in upper 100s at times. During day in 100-120.  Continues to have some lower back pain. Taking ibuprofen with some improvement. Also had some abdominal cramping with exercise.   Cutting back on smoking. Smokes about 1/2 pack per day.  Increased stress recently caring for mother in [place], while working here. Did not start Fluoxetine. Having episodes of feeling overwhelmed.  Review of Systems  Constitutional: Negative for fever, chills, appetite change, fatigue and unexpected weight change.  Eyes: Negative for visual disturbance.  Respiratory: Negative for shortness of breath.   Cardiovascular: Negative for chest pain and leg swelling.  Gastrointestinal: Negative for nausea, vomiting, abdominal pain, diarrhea and constipation.  Musculoskeletal: Positive for arthralgias, back pain and myalgias.  Skin: Negative for color change and rash.  Hematological: Negative for adenopathy. Does not bruise/bleed easily.  Psychiatric/Behavioral: Positive for dysphoric mood. Negative for suicidal ideas and sleep disturbance. The patient is nervous/anxious.        Objective:    BP 120/82  Pulse 89  Temp(Src) 98.2 F (36.8 C) (Oral)  Ht 5\' 10"  (1.778 m)  Wt 257 lb (116.574 kg)  BMI 36.88 kg/m2  SpO2 95% Physical Exam  Constitutional: She is oriented to person, place, and time. She appears well-developed and well-nourished. No distress.  HENT:  Head: Normocephalic and atraumatic.  Right Ear: External ear normal.  Left Ear: External ear normal.  Nose: Nose normal.  Mouth/Throat: Oropharynx is clear and moist. No oropharyngeal exudate.  Eyes: Conjunctivae are normal. Pupils are equal, round, and reactive to light. Right eye  exhibits no discharge. Left eye exhibits no discharge. No scleral icterus.  Neck: Normal range of motion. Neck supple. No tracheal deviation present. No thyromegaly present.  Cardiovascular: Normal rate, regular rhythm, normal heart sounds and intact distal pulses.  Exam reveals no gallop and no friction rub.   No murmur heard. Pulmonary/Chest: Effort normal and breath sounds normal. No accessory muscle usage. Not tachypneic. No respiratory distress. She has no decreased breath sounds. She has no wheezes. She has no rhonchi. She has no rales. She exhibits no tenderness.  Musculoskeletal: Normal range of motion. She exhibits no edema and no tenderness.  Lymphadenopathy:    She has no cervical adenopathy.  Neurological: She is alert and oriented to person, place, and time. No cranial nerve deficit. She exhibits normal muscle tone. Coordination normal.  Skin: Skin is warm and dry. No rash noted. She is not diaphoretic. No erythema. No pallor.  Psychiatric: She has a normal mood and affect. Her behavior is normal. Judgment and thought content normal.          Assessment & Plan:   Problem List Items Addressed This Visit     Unprioritized   Adjustment disorder with mixed anxiety and depressed mood     Will start prn alprazolam for episodes of severe anxiety. Pt prefers not to take daily medication. Discussed potential risks of alprazolam. Follow up by email and in 3 months.    Relevant Medications      ALPRAZolam  (XANAX) tablet   Diabetes type 2, controlled - Primary     Will check A1c with labs. Continue current medications.    Hyperlipidemia  Will repeat lipids and LFTs with labs today. Continue Crestor.    Obesity (BMI 30-39.9)      Wt Readings from Last 3 Encounters:  01/09/14 257 lb (116.574 kg)  09/27/13 251 lb 8 oz (114.08 kg)  08/28/13 256 lb (116.121 kg)   Body mass index is 36.88 kg/(m^2). Encouraged continued healthy diet and exercise.        Return in about 3  months (around 04/11/2014) for Recheck of Diabetes.

## 2014-01-10 ENCOUNTER — Telehealth: Payer: Self-pay | Admitting: Internal Medicine

## 2014-01-10 NOTE — Telephone Encounter (Signed)
emmi emailed °

## 2014-01-11 ENCOUNTER — Other Ambulatory Visit: Payer: Self-pay | Admitting: Internal Medicine

## 2014-01-11 DIAGNOSIS — Z0000 Encounter for general adult medical examination without abnormal findings: Secondary | ICD-10-CM

## 2014-01-18 ENCOUNTER — Other Ambulatory Visit (INDEPENDENT_AMBULATORY_CARE_PROVIDER_SITE_OTHER): Payer: 59

## 2014-01-18 DIAGNOSIS — Z0000 Encounter for general adult medical examination without abnormal findings: Secondary | ICD-10-CM

## 2014-01-18 LAB — COMPREHENSIVE METABOLIC PANEL
ALT: 23 U/L (ref 0–35)
AST: 19 U/L (ref 0–37)
Albumin: 3.6 g/dL (ref 3.5–5.2)
Alkaline Phosphatase: 69 U/L (ref 39–117)
BUN: 9 mg/dL (ref 6–23)
CALCIUM: 9.8 mg/dL (ref 8.4–10.5)
CHLORIDE: 99 meq/L (ref 96–112)
CO2: 28 meq/L (ref 19–32)
Creatinine, Ser: 0.8 mg/dL (ref 0.4–1.2)
GFR: 100.85 mL/min (ref >60.00)
Glucose, Bld: 203 mg/dL — ABNORMAL HIGH (ref 70–99)
Potassium: 4.7 mEq/L (ref 3.5–5.1)
Sodium: 134 mEq/L — ABNORMAL LOW (ref 135–145)
Total Bilirubin: 0.8 mg/dL (ref 0.2–1.2)
Total Protein: 7.7 g/dL (ref 6.0–8.3)

## 2014-01-18 LAB — LIPID PANEL
Cholesterol: 191 mg/dL (ref 0–200)
HDL: 54.5 mg/dL (ref >39.00)
LDL Cholesterol: 116 mg/dL — ABNORMAL HIGH (ref 0–99)
NONHDL: 136.5
Total CHOL/HDL Ratio: 4
Triglycerides: 101 mg/dL (ref 0.0–149.0)
VLDL: 20.2 mg/dL (ref 0.0–40.0)

## 2014-01-18 LAB — CBC WITH DIFFERENTIAL/PLATELET
BASOS ABS: 0 10*3/uL (ref 0.0–0.1)
BASOS PCT: 0.5 % (ref 0.0–3.0)
Eosinophils Absolute: 0.2 10*3/uL (ref 0.0–0.7)
Eosinophils Relative: 2 % (ref 0.0–5.0)
HCT: 41.7 % (ref 36.0–46.0)
Hemoglobin: 13.9 g/dL (ref 12.0–15.0)
LYMPHS PCT: 23.1 % (ref 12.0–46.0)
Lymphs Abs: 2.2 10*3/uL (ref 0.7–4.0)
MCHC: 33.3 g/dL (ref 30.0–36.0)
MCV: 87.8 fl (ref 78.0–100.0)
MONOS PCT: 6 % (ref 3.0–12.0)
Monocytes Absolute: 0.6 10*3/uL (ref 0.1–1.0)
Neutro Abs: 6.6 10*3/uL (ref 1.4–7.7)
Neutrophils Relative %: 68.4 % (ref 43.0–77.0)
Platelets: 330 10*3/uL (ref 150.0–400.0)
RBC: 4.75 Mil/uL (ref 3.87–5.11)
RDW: 12.6 % (ref 11.5–15.5)
WBC: 9.6 10*3/uL (ref 4.0–10.5)

## 2014-01-18 LAB — MICROALBUMIN / CREATININE URINE RATIO
CREATININE, U: 71.5 mg/dL
MICROALB UR: 0.5 mg/dL (ref 0.0–1.9)
Microalb Creat Ratio: 0.7 mg/g (ref 0.0–30.0)

## 2014-01-18 LAB — TSH: TSH: 3.77 u[IU]/mL (ref 0.35–4.50)

## 2014-01-18 LAB — HEMOGLOBIN A1C: Hgb A1c MFr Bld: 8 % — ABNORMAL HIGH (ref 4.6–6.5)

## 2014-01-19 LAB — HIV ANTIBODY (ROUTINE TESTING W REFLEX): HIV 1&2 Ab, 4th Generation: NONREACTIVE

## 2014-01-19 LAB — HEPATITIS B SURFACE ANTIBODY,QUALITATIVE: Hep B S Ab: POSITIVE — AB

## 2014-01-19 LAB — VITAMIN D 25 HYDROXY (VIT D DEFICIENCY, FRACTURES): VITD: 46.6 ng/mL (ref 30.00–100.00)

## 2014-01-19 LAB — HEPATITIS B SURFACE ANTIGEN: Hepatitis B Surface Ag: NEGATIVE

## 2014-01-19 LAB — GC/CHLAMYDIA PROBE AMP, URINE
Chlamydia, Swab/Urine, PCR: NEGATIVE
GC Probe Amp, Urine: NEGATIVE

## 2014-01-19 LAB — RPR

## 2014-01-19 LAB — HEPATITIS C ANTIBODY: HCV Ab: NEGATIVE

## 2014-02-07 ENCOUNTER — Ambulatory Visit: Payer: 59 | Admitting: Internal Medicine

## 2014-03-01 ENCOUNTER — Emergency Department: Payer: Self-pay | Admitting: Student

## 2014-04-11 ENCOUNTER — Telehealth: Payer: Self-pay | Admitting: Internal Medicine

## 2014-04-12 ENCOUNTER — Ambulatory Visit: Payer: 59 | Admitting: Internal Medicine

## 2014-04-19 ENCOUNTER — Telehealth: Payer: Self-pay | Admitting: Internal Medicine

## 2014-04-19 ENCOUNTER — Ambulatory Visit: Payer: 59 | Admitting: Internal Medicine

## 2014-04-19 NOTE — Telephone Encounter (Signed)
Please see below note

## 2014-04-19 NOTE — Telephone Encounter (Signed)
OK. Fine to cancel. 

## 2014-04-19 NOTE — Telephone Encounter (Signed)
Pt came in for appt thinking is was a 8am. Pt has rescheduled. Please advise to cancel off schedule for today.[email or web address]

## 2014-04-26 ENCOUNTER — Ambulatory Visit (INDEPENDENT_AMBULATORY_CARE_PROVIDER_SITE_OTHER): Payer: 59 | Admitting: Internal Medicine

## 2014-04-26 ENCOUNTER — Encounter: Payer: Self-pay | Admitting: Internal Medicine

## 2014-04-26 VITALS — BP 122/88 | HR 89 | Temp 98.2°F | Ht 70.0 in | Wt 262.2 lb

## 2014-04-26 DIAGNOSIS — R252 Cramp and spasm: Secondary | ICD-10-CM

## 2014-04-26 DIAGNOSIS — E669 Obesity, unspecified: Secondary | ICD-10-CM

## 2014-04-26 DIAGNOSIS — M62838 Other muscle spasm: Secondary | ICD-10-CM | POA: Insufficient documentation

## 2014-04-26 DIAGNOSIS — E785 Hyperlipidemia, unspecified: Secondary | ICD-10-CM

## 2014-04-26 DIAGNOSIS — E119 Type 2 diabetes mellitus without complications: Secondary | ICD-10-CM

## 2014-04-26 LAB — LIPID PANEL
Cholesterol: 205 mg/dL — ABNORMAL HIGH (ref 0–200)
HDL: 51.5 mg/dL (ref >39.00)
LDL Cholesterol: 125 mg/dL — ABNORMAL HIGH (ref 0–99)
NonHDL: 153.5
Total CHOL/HDL Ratio: 4
Triglycerides: 145 mg/dL (ref 0.0–149.0)
VLDL: 29 mg/dL (ref 0.0–40.0)

## 2014-04-26 LAB — HEMOGLOBIN A1C: Hgb A1c MFr Bld: 8.9 % — ABNORMAL HIGH (ref 4.6–6.5)

## 2014-04-26 LAB — MAGNESIUM: MAGNESIUM: 1.9 mg/dL (ref 1.5–2.5)

## 2014-04-26 LAB — COMPREHENSIVE METABOLIC PANEL
ALT: 20 U/L (ref 0–35)
AST: 15 U/L (ref 0–37)
Albumin: 4 g/dL (ref 3.5–5.2)
Alkaline Phosphatase: 75 U/L (ref 39–117)
BUN: 10 mg/dL (ref 6–23)
CHLORIDE: 99 meq/L (ref 96–112)
CO2: 26 mEq/L (ref 19–32)
Calcium: 9.8 mg/dL (ref 8.4–10.5)
Creatinine, Ser: 0.76 mg/dL (ref 0.40–1.20)
GFR: 106.86 mL/min (ref >60.00)
GLUCOSE: 214 mg/dL — AB (ref 70–99)
POTASSIUM: 4.4 meq/L (ref 3.5–5.1)
SODIUM: 134 meq/L — AB (ref 135–145)
Total Bilirubin: 0.6 mg/dL (ref 0.2–1.2)
Total Protein: 7 g/dL (ref 6.0–8.3)

## 2014-04-26 LAB — MICROALBUMIN / CREATININE URINE RATIO
Creatinine,U: 66.5 mg/dL
MICROALB/CREAT RATIO: 1.1 mg/g (ref 0.0–30.0)
Microalb, Ur: <0.7 mg/dL (ref 0.0–1.9)

## 2014-04-26 LAB — TSH: TSH: 2.17 u[IU]/mL (ref 0.35–4.50)

## 2014-04-26 NOTE — Assessment & Plan Note (Signed)
Wt Readings from Last 3 Encounters:  04/26/14 262 lb 4 oz (118.956 kg)  01/09/14 257 lb (116.574 kg)  09/27/13 251 lb 8 oz (114.08 kg)   Body mass index is 37.63 kg/(m^2). Encouraged healthy diet and exercise.

## 2014-04-26 NOTE — Assessment & Plan Note (Signed)
Will check lipids and LFTs with labs. Continue Rosuvastatin. 

## 2014-04-26 NOTE — Assessment & Plan Note (Signed)
Intermittent cramping of rectus abdominus muscle. Encouraged stretching. Will check electrolytes including Mag with labs.

## 2014-04-26 NOTE — Patient Instructions (Signed)
Labs today.   Follow up in 3 months.  

## 2014-04-26 NOTE — Progress Notes (Signed)
Pre visit review using our clinic review tool, if applicable. No additional management support is needed unless otherwise documented below in the visit note. 

## 2014-04-26 NOTE — Progress Notes (Signed)
Subjective:    Patient ID: Julia Brown, female    DOB: [birth year], 44 y.o.   MRN: [id number]  HPI  45YO female presents for follow up.  DM - BG have been up and down. Traveling frequently. All under 200. Mostly 140-150.  HTN - BP typically 117/70s when checked at work at [employer] No chest pain or headache.  Having occasional left upper abdominal wall cramping. Occurs often during exercise such as performing a plank. No persistent pain. Not taking anything for pain.  Recently seen in ED for right facial swelling. Found to have parotiditis. Symptoms have resolved.  Wt Readings from Last 3 Encounters:  04/26/14 262 lb 4 oz (118.956 kg)  01/09/14 257 lb (116.574 kg)  09/27/13 251 lb 8 oz (114.08 kg)   BP Readings from Last 3 Encounters:  04/26/14 122/88  01/09/14 120/82  09/27/13 120/80     Past medical, surgical, family and social history per today's encounter.  Review of Systems  Constitutional: Negative for fever, chills, appetite change, fatigue and unexpected weight change.  Eyes: Negative for visual disturbance.  Respiratory: Negative for shortness of breath.   Cardiovascular: Negative for chest pain and leg swelling.  Gastrointestinal: Positive for abdominal pain (left upper abdominal wall pain intermittently with exercise such as plank). Negative for nausea, vomiting, diarrhea, constipation, blood in stool and abdominal distention.  Musculoskeletal: Negative for myalgias and arthralgias.  Skin: Negative for color change and rash.  Hematological: Negative for adenopathy. Does not bruise/bleed easily.  Psychiatric/Behavioral: Negative for dysphoric mood. The patient is not nervous/anxious.        Objective:    BP 122/88 mmHg  Pulse 89  Temp(Src) 98.2 F (36.8 C) (Oral)  Ht  (1.778 m)  Wt 262 lb 4 oz (118.956 kg)  BMI 37.63 kg/m2  SpO2 97% Physical Exam  Constitutional: She is oriented to person, place, and time. She appears well-developed and  well-nourished. No distress.  HENT:  Head: Normocephalic and atraumatic.  Right Ear: External ear normal.  Left Ear: External ear normal.  Nose: Nose normal.  Mouth/Throat: Oropharynx is clear and moist. No oropharyngeal exudate.  Eyes: Conjunctivae and EOM are normal. Pupils are equal, round, and reactive to light. Right eye exhibits no discharge.  Neck: Normal range of motion. Neck supple. No thyromegaly present.  Cardiovascular: Normal rate, regular rhythm, normal heart sounds and intact distal pulses.  Exam reveals no gallop and no friction rub.   No murmur heard. Pulmonary/Chest: Effort normal. No respiratory distress. She has no wheezes. She has no rales.  Abdominal: Soft. Bowel sounds are normal. She exhibits no distension and no mass. There is no tenderness. There is no rebound and no guarding.  Musculoskeletal: Normal range of motion. She exhibits no edema or tenderness.  Lymphadenopathy:    She has no cervical adenopathy.  Neurological: She is alert and oriented to person, place, and time. No cranial nerve deficit. Coordination normal.  Skin: Skin is warm and dry. No rash noted. She is not diaphoretic. No erythema. No pallor.  Psychiatric: She has a normal mood and affect. Her behavior is normal. Judgment and thought content normal.          Assessment & Plan:   Problem List Items Addressed This Visit      Unprioritized   Diabetes type 2, controlled - Primary    Will check A1c with labs. Continue current medications. Encouraged her to get yearly eye exam.      Relevant Orders  Comprehensive metabolic panel   Hemoglobin A1c   Lipid panel   Microalbumin / creatinine urine ratio   TSH   Hyperlipidemia    Will check lipids and LFTs with labs. Continue Rosuvastatin.      Muscle cramp    Intermittent cramping of rectus abdominus muscle. Encouraged stretching. Will check electrolytes including Mag with labs.      Relevant Orders   Magnesium   Obesity (BMI  30-39.9)    Wt Readings from Last 3 Encounters:  04/26/14 262 lb 4 oz (118.956 kg)  01/09/14 257 lb (116.574 kg)  09/27/13 251 lb 8 oz (114.08 kg)   Body mass index is 37.63 kg/(m^2). Encouraged healthy diet and exercise.          Return in about 3 months (around 07/25/2014) for Recheck of Diabetes.

## 2014-04-26 NOTE — Assessment & Plan Note (Signed)
Will check A1c with labs. Continue current medications. Encouraged her to get yearly eye exam.

## 2014-04-27 ENCOUNTER — Encounter: Payer: Self-pay | Admitting: *Deleted

## 2014-04-27 DIAGNOSIS — Z794 Long term (current) use of insulin: Principal | ICD-10-CM

## 2014-04-27 DIAGNOSIS — E1165 Type 2 diabetes mellitus with hyperglycemia: Secondary | ICD-10-CM

## 2014-04-27 DIAGNOSIS — IMO0002 Reserved for concepts with insufficient information to code with codable children: Secondary | ICD-10-CM

## 2014-05-15 ENCOUNTER — Encounter: Payer: Self-pay | Admitting: Endocrinology

## 2014-05-15 ENCOUNTER — Ambulatory Visit (INDEPENDENT_AMBULATORY_CARE_PROVIDER_SITE_OTHER): Payer: 59 | Admitting: Endocrinology

## 2014-05-15 VITALS — BP 140/82 | HR 83 | Resp 14 | Ht 70.0 in | Wt 263.8 lb

## 2014-05-15 DIAGNOSIS — E119 Type 2 diabetes mellitus without complications: Secondary | ICD-10-CM

## 2014-05-15 DIAGNOSIS — Z720 Tobacco use: Secondary | ICD-10-CM

## 2014-05-15 DIAGNOSIS — E785 Hyperlipidemia, unspecified: Secondary | ICD-10-CM

## 2014-05-15 DIAGNOSIS — E669 Obesity, unspecified: Secondary | ICD-10-CM

## 2014-05-15 LAB — HM DIABETES FOOT EXAM: HM Diabetic Foot Exam: NORMAL

## 2014-05-15 MED ORDER — METFORMIN HCL 500 MG PO TABS: ORAL_TABLET | ORAL | Status: DC

## 2014-05-15 MED ORDER — CANAGLIFLOZIN 100 MG PO TABS: 100.0000 mg | ORAL_TABLET | Freq: Every day | ORAL | Status: DC

## 2014-05-15 NOTE — Patient Instructions (Signed)
Check sugars 2 x daily ( before breakfast and before supper).  Record them in a log book and bring that/meter to next appointment.   Increase metformin to 500 mg morning and 1000 mg at night Start Invokana at 100 mg daily in the morning. Stay hydrated with water and return in 2 weeks for lab check.  Continue current lantus and Victoza.   Notify if start to have lows.  Start eating a consistent breakfast. Please come back for a follow-up appointment in 1 month.

## 2014-05-15 NOTE — Assessment & Plan Note (Signed)
Recently some non compliance with night time meds- encouraged her to take Crestor daily as directed. Recent LDL not at target. Recheck in 3 months.

## 2014-05-15 NOTE — Assessment & Plan Note (Signed)
Discussed briefly importance of quitting smoking. She is willing to consider cutting back further on cig use.

## 2014-05-15 NOTE — Progress Notes (Signed)
Pre visit review using our clinic review tool, if applicable. No additional management support is needed unless otherwise documented below in the visit note. 

## 2014-05-15 NOTE — Progress Notes (Signed)
Reason for visit-  Julia Brown is a 44 y.o.-year-old female, referred by her PCP,  Shelia Media, MD for management of Type 2 diabetes, uncontrolled, without complications.   HPI- Patient has been diagnosed with diabetes in 1994 after being diagnosed with GDM. Recalls going through through DM center classes, 2202 and now is enrolled through [facility] program for [facility] health employees- once monthly meeting. Goes back and forth between [place] and here due to taking care of family as well.  Tried  Metformin, Glipizide,  Januvia, Byetta/Victoza. she has been on insulin since past 10 years.    Pt is currently on a regimen of: - Metformin 500 mg po bid- hasn't tried the higher doses - Lantus 60 units qam - Victoza 1.8 mg daily ( since ~2013)   Last hemoglobin A1c was: Lab Results  Component Value Date   HGBA1C 8.9* 04/26/2014   HGBA1C 8.0* 01/18/2014   HGBA1C 9.1* 09/15/2013     Pt checks her sugars 1 a week . Uses Freestyle  Glucometer- 43 years old- requests change in meter. By recall they are:  PREMEAL Breakfast Lunch Dinner Bedtime Overall  Glucose range: 150-250      Mean/median:        POST-MEAL PC Breakfast PC Lunch PC Dinner  Glucose range:     Mean/median:       Hypoglycemia-  Symptomatic low around lunch x 1 recently. Lowest sugar was n/a; she has hypoglycemia awareness at 70.   Dietary habits- eats 2 times daily- skips BF and lacks appetite at BF after taking Victoza. Tries to limit carbs, sweetened beverages, sodas, desserts. Lately eating out more due to freq travel and taking care of family here and in [place] Exercise- stays active at work. No formal exercise. Works 2 jobs as well.  Weight - going up Wt Readings from Last 3 Encounters:  05/15/14 263 lb 12 oz (119.636 kg)  04/26/14 262 lb 4 oz (118.956 kg)  01/09/14 257 lb (116.574 kg)    Diabetes Complications-  Nephropathy- No  CKD, last BUN/creatinine-  Lab Results  Component Value Date   BUN 10 04/26/2014   CREATININE 0.76 04/26/2014   Lab Results  Component Value Date   GFR 106.86 04/26/2014   Lab Results  Component Value Date   MICRALBCREAT 1.1 04/26/2014   Retinopathy- No, Last DEE was in March 2015, going to make appt for this year Neuropathy- no numbness and tingling in her feet. No known neuropathy.  Associated history - No CAD . No prior stroke. No hypothyroidism. her last TSH was  Lab Results  Component Value Date   TSH 2.17 04/26/2014    Hyperlipidemia-  her last set of lipids were- Currently on Crestor 5 mg daily. Tolerating well. Sometimes not taking night time med.    Lab Results  Component Value Date   CHOL 205* 04/26/2014   HDL 51.50 04/26/2014   LDLCALC 125* 04/26/2014   TRIG 145.0 04/26/2014   CHOLHDL 4 04/26/2014    Blood Pressure/HTN- Patient's blood pressure is slight above target today on current regimen that includes ACE-I.  Pt has FH of DM in parents. She is working on quitting smoking. Smoking 1/2 PPD.  I have reviewed the patient's past medical history, family and social history, surgical history, medications and allergies.  Past Medical History  Diagnosis Date  . Diabetes mellitus   . Hyperlipidemia   . Hypertension   . Chicken pox   . GERD (gastroesophageal reflux disease)   .  UTI (lower urinary tract infection)    Past Surgical History  Procedure Laterality Date  . Tubal ligation    . Uterine ablation    . Cholecystectomy  1993  . Cesarean section  1994  . Ablation  2007    excessive bleeding  . Orif femur fracture Right     [place]   Family History  Problem Relation Age of Onset  . Heart disease Father   . Hyperlipidemia Father   . Diabetes Father   . Hyperlipidemia Mother   . Hypertension Mother   . Schizophrenia Mother   . Depression Mother   . Diabetes Mother   . Cancer Maternal Aunt     breast  . Diabetes Maternal Grandmother   . Diabetes Maternal Grandfather   . Heart disease Other    History   Social History  .  Marital Status: Married    Spouse Name: N/A  . Number of Children: N/A  . Years of Education: N/A   Occupational History  . Not on file.   Social History Main Topics  . Smoking status: Current Every Day Smoker -- 0.50 packs/day for 10 years    Types: Cigarettes  . Smokeless tobacco: Never Used     Comment: smoked < 1 pack a day  . Alcohol Use: Yes     Comment: weekend  . Drug Use: No  . Sexual Activity: Not on file   Other Topics Concern  . Not on file   Social History Narrative   Lives in [place].      Work - [employer]      Diet - regular   Current Outpatient Prescriptions on File Prior to Visit  Medication Sig Dispense Refill  . ALPRAZolam (XANAX) 0.25 MG tablet Take 1 tablet (0.25 mg total) by mouth 2 (two) times daily as needed for anxiety. 60 tablet 0  . carvedilol (COREG) 3.125 MG tablet Take 1 tablet (3.125 mg total) by mouth 2 (two) times daily with a meal. 180 tablet 1  . Insulin Glargine (LANTUS SOLOSTAR) 100 UNIT/ML Solostar Pen Inject 60 Units into the skin daily. 5 pen PRN  . Liraglutide (VICTOZA) 18 MG/3ML SOLN Inject 1.8 mLs into the skin daily.    . meloxicam (MOBIC) 15 MG tablet Take 15 mg by mouth daily.    . OMEGA 3 1000 MG CAPS Take 1,000 mg by mouth. 1 qam, 2qpm     . omeprazole (PRILOSEC) 40 MG capsule Take 1 capsule (40 mg total) by mouth daily. 30 capsule 3  . quinapril (ACCUPRIL) 20 MG tablet Take 1 tablet (20 mg total) by mouth at bedtime. 30 tablet 0  . rosuvastatin (CRESTOR) 5 MG tablet Take 1 tablet (5 mg total) by mouth daily. 90 tablet 3   No current facility-administered medications on file prior to visit.   Allergies  Allergen Reactions  . Statins   . Penicillins Rash     Review of Systems:  complains of  [  ] denies General:   [  ] Recent weight change [  ] Fatigue  [  ] Loss of appetite Eyes: [  ]  Vision Difficulty [  ]  Eye pain ENT: [  ]  Hearing difficulty [  ]  Difficulty Swallowing CVS: [  ] Chest pain [  ]   Palpitations/Irregular Heart beat [  ]  Shortness of breath lying flat [  ] Swelling of legs Resp: [  ] Frequent Cough [  ] Shortness  of Breath  [  ]  Wheezing GI: [  ] Heartburn  [  ] Nausea or Vomiting  [  ] Diarrhea [  ] Constipation  [ x ] Abdominal Pain- mild epigastric?HH GU: [  ]  Polyuria  [  ]  nocturia Bones/joints:  [ x ]  Muscle aches  [  ] Joint Pain  [  ] Bone pain Skin/Hair/Nails: [  ]  Rash  [  ] New stretch marks [  ]  Itching [ x ] Hair loss [  ]  Excessive hair growth Reproduction: [  ] Low sexual desire , [  ]  Women: Menstrual cycle problems [  ]  Women: Breast Discharge [  ] Men: Difficulty with erections [  ]  Men: Enlarged Breasts CNS: [  ] Frequent Headaches [  ] Blurry vision [  ] Tremors [  ] Seizures [  ] Loss of consciousness [  ] Localized weakness Endocrine: [  ]  Excess thirst [  ]  Feeling excessively hot [  ]  Feeling excessively cold Heme: [  ]  Easy bruising [  ]  Enlarged glands or lumps in neck Allergy: [  ]  Food allergies [  ] Environmental allergies  PE: BP 140/82 mmHg  Pulse 83  Resp 14  Ht 5\' 10"  (1.778 m)  Wt 263 lb 12 oz (119.636 kg)  BMI 37.84 kg/m2  SpO2 97% Wt Readings from Last 3 Encounters:  05/15/14 263 lb 12 oz (119.636 kg)  04/26/14 262 lb 4 oz (118.956 kg)  01/09/14 257 lb (116.574 kg)   GENERAL: No acute distress, well developed HEENT:  Eye exam shows normal external appearance. Oral exam shows normal mucosa .  NECK:   Neck exam shows no lymphadenopathy. No Carotids bruits. Thyroid is not enlarged and no nodules felt.  + acanthosis nigricans LUNGS:         Chest is symmetrical. Lungs are clear to auscultation.Marland Kitchen.   HEART:         Heart sounds:  S1 and S2 are normal. No murmurs or clicks heard. ABDOMEN:  No Distention present. Liver and spleen are not palpable. No other mass or tenderness present.  EXTREMITIES:     There is no edema. 2+ DP pulses  NEUROLOGICAL:     Grossly intact.            Diabetic foot exam done with shoes and  socks removed: Normal Monofilament testing bilaterally. Hammertoes B/L great toes. Nails  Dystrophic- right feet toe nail fungus. Skin normal color. No open wounds. Dry skin.  MUSCULOSKELETAL:       There is no enlargement or gross deformity of the joints.  SKIN:       No rash  ASSESSMENT AND PLAN: Problem List Items Addressed This Visit      Endocrine   Diabetes type 2, controlled - Primary    Discussed goal sugars and goal A1c and risks of long term complications with uncontrolled DM.  Discussed lifestyle factors and encouraged f/u with DM clinic at [facility] as well.   Encouraged her to check sugars 2 x daily. Given OneTouch verio meter and she will notify me if this one os covered so that the testing supplies Rx could be sent over.   Discussed medication options including switch to Toujeo to give better insulin profile /better absorption as compared to lantus- will consider this at next visit.  Discussed options including VGO, meal time insulin, SGLT2.  She has elected to try Invokana 100 mg daily. Return in 2 weeks for GFr check- stay hydrated.  She will also increase metformin to 1000 mg qhs and take 500 mg in the morning.  Continue current lantus and Victoza.  Follow up 1 month.  Make f/u eye appointment for DEE.        Relevant Medications   canagliflozin (INVOKANA) 100 MG TABS tablet   metFORMIN (GLUCOPHAGE) tablet   Other Relevant Orders   Basic metabolic panel     Other   Hyperlipidemia    Recently some non compliance with night time meds- encouraged her to take Crestor daily as directed. Recent LDL not at target. Recheck in 3 months.       Tobacco abuse    Discussed briefly importance of quitting smoking. She is willing to consider cutting back further on cig use.       Obesity (BMI 30-39.9)    Discussed about dietary modifications, eating out, working with lifestyle clinic. Current DM medications will also help with weight management. Encouraged to start walking for  exercise.      Relevant Medications   canagliflozin (INVOKANA) 100 MG TABS tablet   metFORMIN (GLUCOPHAGE) tablet         - Return to clinic in 1 mo with sugar log/meter.  Nikola Marone [facility] 05/15/2014 10:36 AM

## 2014-05-15 NOTE — Assessment & Plan Note (Signed)
Discussed about dietary modifications, eating out, working with lifestyle clinic. Current DM medications will also help with weight management. Encouraged to start walking for exercise.

## 2014-05-15 NOTE — Assessment & Plan Note (Addendum)
Discussed goal sugars and goal A1c and risks of long term complications with uncontrolled DM.  Discussed lifestyle factors and encouraged f/u with [facility] clinic at [facility] as well.   Encouraged her to check sugars 2 x daily. Given OneTouch verio meter and she will notify me if this one os covered so that the testing supplies Rx could be sent over.   Discussed medication options including switch to Toujeo to give better insulin profile /better absorption as compared to lantus- will consider this at next visit.  Discussed options including VGO, meal time insulin, SGLT2.  She has elected to try Invokana 100 mg daily. Return in 2 weeks for GFr check- stay hydrated.  She will also increase metformin to 1000 mg qhs and take 500 mg in the morning.  Continue current lantus and Victoza.  Follow up 1 month.  Make f/u eye appointment for DEE.

## 2014-05-29 ENCOUNTER — Other Ambulatory Visit (INDEPENDENT_AMBULATORY_CARE_PROVIDER_SITE_OTHER): Payer: 59

## 2014-05-29 DIAGNOSIS — E119 Type 2 diabetes mellitus without complications: Secondary | ICD-10-CM

## 2014-05-29 LAB — BASIC METABOLIC PANEL
BUN: 11 mg/dL (ref 6–23)
CO2: 27 mEq/L (ref 19–32)
Calcium: 9.9 mg/dL (ref 8.4–10.5)
Chloride: 101 mEq/L (ref 96–112)
Creatinine, Ser: 0.85 mg/dL (ref 0.40–1.20)
GFR: 93.88 mL/min (ref >60.00)
Glucose, Bld: 158 mg/dL — ABNORMAL HIGH (ref 70–99)
Potassium: 4.3 mEq/L (ref 3.5–5.1)
Sodium: 134 mEq/L — ABNORMAL LOW (ref 135–145)

## 2014-06-12 ENCOUNTER — Ambulatory Visit (INDEPENDENT_AMBULATORY_CARE_PROVIDER_SITE_OTHER): Payer: 59 | Admitting: Endocrinology

## 2014-06-12 ENCOUNTER — Encounter: Payer: Self-pay | Admitting: Endocrinology

## 2014-06-12 VITALS — BP 134/92 | HR 87 | Resp 14 | Ht 70.0 in | Wt 255.0 lb

## 2014-06-12 DIAGNOSIS — E785 Hyperlipidemia, unspecified: Secondary | ICD-10-CM

## 2014-06-12 DIAGNOSIS — E669 Obesity, unspecified: Secondary | ICD-10-CM

## 2014-06-12 DIAGNOSIS — E119 Type 2 diabetes mellitus without complications: Secondary | ICD-10-CM

## 2014-06-12 NOTE — Assessment & Plan Note (Signed)
Recently some non compliance with night time meds- encouraged her to take Crestor daily as directed. Recent LDL not at target. Recheck at next visit.

## 2014-06-12 NOTE — Progress Notes (Signed)
Pre visit review using our clinic review tool, if applicable. No additional management support is needed unless otherwise documented below in the visit note. 

## 2014-06-12 NOTE — Progress Notes (Signed)
Reason for visit-  Julia Brown is a 44 y.o.-year-old female, here for follow up of Type 2 diabetes, uncontrolled, without complications.   HPI- Patient has been diagnosed with diabetes in 1994 after being diagnosed with GDM. Recalls going through through DM center classes, 2002 and now is enrolled through [facility] program for [employer] health employees- once monthly meeting. Goes back and forth between [place] and here due to taking care of family as well.  Tried  Metformin, Glipizide,  Januvia, Byetta/Victoza. she has been on insulin since past 10 years.    Pt is currently on a regimen of: - Metformin 500 mg po bid- hasn't tried the higher doses- didn't go up on the dose as told to last visit - Lantus 60 units qam - Victoza 1.8 mg daily ( since ~2013) -Invokana 100 mg daily ( start Feb 2016)  * reports getting 2 yeats infections after starting Invokana. Wants to continue Invokana still. Denies stress incontinence.   Last hemoglobin A1c was: Lab Results  Component Value Date   HGBA1C 8.9* 04/26/2014   HGBA1C 8.0* 01/18/2014   HGBA1C 9.1* 09/15/2013     Pt checks her sugars 2-3 times daily . Uses [employer]. By  Sugar log they are improving:  PREMEAL Breakfast Lunch Dinner Bedtime Overall  Glucose range: 117-178 71-116 112-158    Mean/median:        POST-MEAL PC Breakfast PC Lunch PC Dinner  Glucose range:     Mean/median:       Hypoglycemia-  Lower sugars around lunch in the 73-77 range. Lowest sugar was 73; she has hypoglycemia awareness at 70.   Dietary habits- eats 2 times daily- skips BF and lacks appetite at BF after taking Victoza. Tries to limit carbs, sweetened beverages, sodas, desserts. Lately eating out more due to freq travel and taking care of family here and in [place]- some weeks are better than others Exercise- stays active at work. No formal exercise. Works 2 jobs as well.  Weight - down  Wt Readings from Last 3 Encounters:  06/12/14 255 lb (115.667 kg)   05/15/14 263 lb 12 oz (119.636 kg)  04/26/14 262 lb 4 oz (118.956 kg)    Diabetes Complications-  Nephropathy- No  CKD, last BUN/creatinine-  Lab Results  Component Value Date   BUN 11 05/29/2014   CREATININE 0.85 05/29/2014   Lab Results  Component Value Date   GFR 93.88 05/29/2014   Lab Results  Component Value Date   MICRALBCREAT 1.1 04/26/2014   Retinopathy- No, Last DEE was in March 2015, going to make appt for this year- still hasn't made one Neuropathy- no numbness and tingling in her feet. No known neuropathy.  Associated history - No CAD . No prior stroke. No hypothyroidism. her last TSH was  Lab Results  Component Value Date   TSH 2.17 04/26/2014    Hyperlipidemia-  her last set of lipids were- Currently on Crestor 5 mg daily. Tolerating well. Sometimes not taking night time med, but gotten much better with compliance recently.    Lab Results  Component Value Date   CHOL 205* 04/26/2014   HDL 51.50 04/26/2014   LDLCALC 125* 04/26/2014   TRIG 145.0 04/26/2014   CHOLHDL 4 04/26/2014    Blood Pressure/HTN- Patient's blood pressure is  above target today on current regimen that includes ACE-I. Reports had a cigarette and 2 cups coffee just prior to coming in. By reports BP checks during the rest of the day  are much better.    She is working on quitting smoking. Smoking 1/2 PPD.  I have reviewed the patient's past medical history,  medications and allergies.   Current Outpatient Prescriptions on File Prior to Visit  Medication Sig Dispense Refill  . canagliflozin (INVOKANA) 100 MG TABS tablet Take 1 tablet (100 mg total) by mouth daily. 30 tablet 3  . carvedilol (COREG) 3.125 MG tablet Take 1 tablet (3.125 mg total) by mouth 2 (two) times daily with a meal. 180 tablet 1  . Insulin Glargine (LANTUS SOLOSTAR) 100 UNIT/ML Solostar Pen Inject 60 Units into the skin daily. 5 pen PRN  . Liraglutide (VICTOZA) 18 MG/3ML SOLN Inject 1.8 mLs into the skin daily.    .  metFORMIN (GLUCOPHAGE) 500 MG tablet Take 500 mg in the morning and 1000 mg at night time 90 tablet 3  . omeprazole (PRILOSEC) 40 MG capsule Take 1 capsule (40 mg total) by mouth daily. 30 capsule 3  . quinapril (ACCUPRIL) 20 MG tablet Take 1 tablet (20 mg total) by mouth at bedtime. 30 tablet 0  . rosuvastatin (CRESTOR) 5 MG tablet Take 1 tablet (5 mg total) by mouth daily. 90 tablet 3   No current facility-administered medications on file prior to visit.   Allergies  Allergen Reactions  . Statins   . Penicillins Rash    Review of Systems- [ x ]  Complains of    [  ]  denies [  ] Recent weight change [  ]  Fatigue [  ] polydipsia [  x] polyuria [  ]  nocturia [  ]  vision difficulty [  ] chest pain [  ] shortness of breath [  ] leg swelling [  ] cough [  ] nausea/vomiting [  ] diarrhea [  ] constipation [  ] abdominal pain [  ]  tingling/numbness in extremities [  ]  concern with feet ( wounds/sores)   PE: BP 134/92 mmHg  Pulse 87  Resp 14  Ht 5\' 10"  (1.778 m)  Wt 255 lb (115.667 kg)  BMI 36.59 kg/m2  SpO2 97% Wt Readings from Last 3 Encounters:  06/12/14 255 lb (115.667 kg)  05/15/14 263 lb 12 oz (119.636 kg)  04/26/14 262 lb 4 oz (118.956 kg)   Exam: deferred  ASSESSMENT AND PLAN: Problem List Items Addressed This Visit      Endocrine   Diabetes type 2, controlled - Primary    Encouraged to follow with lifestyle clinic and continue with the progress.   Encouraged her to check sugars 2 x daily.  At next visits, will consider switch  to Toujeo to give better insulin profile /better absorption as compared to lantus Continue Invokana for now. Discussed female hygiene. If continues to have repeated yeast infections, then will re-discuss whether she wants to continue the therapy. Currently, she wants to.  She will also increase metformin to 1000 mg qhs and take 500 mg in the morning. She didn't make this change last time, but was encouraged to do so this time.  Continue  current  Victoza for now but move it to Lunch time to see whether morning nausea gets better. Not sure whether it is GERD or Victoza causing symptoms at that time. Decrease lantus to 56 units am due to day time hypoglycemia. She will notify me if she continues to get lows, during the day inspite of this adjustment.    Follow up 1 month.  Make f/u eye appointment for  DEE.   Keep check on BP during the day like she is doing. Will follow along for now.            Other   Hyperlipidemia    Recently some non compliance with night time meds- encouraged her to take Crestor daily as directed. Recent LDL not at target. Recheck at next visit.         Obesity (BMI 30-39.9)    Discussed about dietary modifications, eating out, working with lifestyle clinic. Current DM medications will also help with weight management. Encouraged to start walking for exercise. Seeing weight reductions after start of Invokana.               - Return to clinic in 2 mo with sugar log/meter.  Lundyn Coste [facility] 06/12/2014 10:03 AM

## 2014-06-12 NOTE — Assessment & Plan Note (Signed)
Discussed about dietary modifications, eating out, working with lifestyle clinic. Current DM medications will also help with weight management. Encouraged to start walking for exercise. Seeing weight reductions after start of Invokana.      

## 2014-06-12 NOTE — Patient Instructions (Signed)
Check sugars 2 x daily ( before breakfast and before supper).  Record them in a log book and bring that/meter to next appointment.   Continue current victoza- move to lunch time Continue current Invokana- morning time with BF Change lantus to 56 units morning.  Increase metformin to 500 mg morning and 1000 mg at night  Report back if still seeing low sugars around premeals  Please come back for a follow-up appointment in 2 months.

## 2014-06-12 NOTE — Assessment & Plan Note (Addendum)
Encouraged to follow with lifestyle clinic and continue with the progress.   Encouraged her to check sugars 2 x daily.  At next visits, will consider switch  to Toujeo to give better insulin profile /better absorption as compared to lantus Continue Invokana for now. Discussed female hygiene. If continues to have repeated yeast infections, then will re-discuss whether she wants to continue the therapy. Currently, she wants to.  She will also increase metformin to 1000 mg qhs and take 500 mg in the morning. She didn't make this change last time, but was encouraged to do so this time.  Continue current  Victoza for now but move it to Lunch time to see whether morning nausea gets better. Not sure whether it is GERD or Victoza causing symptoms at that time. Decrease lantus to 56 units am due to day time hypoglycemia. She will notify me if she continues to get lows, during the day inspite of this adjustment.    Follow up 1 month.  Make f/u eye appointment for DEE.   Keep check on BP during the day like she is doing. Will follow along for now.

## 2014-07-16 ENCOUNTER — Ambulatory Visit: Payer: 59

## 2014-07-18 ENCOUNTER — Other Ambulatory Visit: Payer: Self-pay

## 2014-07-18 NOTE — Patient Outreach (Signed)
[facility] Care Management  07/18/2014  Julia CrumbMonique T Brown 01/03/1971 [id number]   [name] did not show for her scheduled visit yesterday- she had another appointment yesterday and forgot mine.  She called me to apologize for missing the appointment.   I will wait to hear back from her after she sees her MD; she sees Dr. Dan HumphreysWalker on 07/31/14 and Dr. Welford RochePhadke on 08/14/14.   Susette RacerJulie Dupree Givler, RN, BA, MHA, CDE [facility] Diabetes Coordinator- Link To [employer] [employer] Dial:  [phone]  Fax:  [phone] E-mail: [email or web address] [address], [place], [place]  [id number]

## 2014-07-31 ENCOUNTER — Encounter: Payer: Self-pay | Admitting: Internal Medicine

## 2014-07-31 ENCOUNTER — Other Ambulatory Visit: Payer: Self-pay | Admitting: Endocrinology

## 2014-07-31 ENCOUNTER — Ambulatory Visit (INDEPENDENT_AMBULATORY_CARE_PROVIDER_SITE_OTHER): Payer: 59 | Admitting: Internal Medicine

## 2014-07-31 VITALS — BP 113/74 | HR 83 | Temp 98.3°F | Ht 70.0 in | Wt 251.5 lb

## 2014-07-31 DIAGNOSIS — E785 Hyperlipidemia, unspecified: Secondary | ICD-10-CM

## 2014-07-31 DIAGNOSIS — E119 Type 2 diabetes mellitus without complications: Secondary | ICD-10-CM

## 2014-07-31 DIAGNOSIS — E669 Obesity, unspecified: Secondary | ICD-10-CM | POA: Diagnosis not present

## 2014-07-31 DIAGNOSIS — Z1239 Encounter for other screening for malignant neoplasm of breast: Secondary | ICD-10-CM

## 2014-07-31 LAB — COMPREHENSIVE METABOLIC PANEL
ALT: 19 U/L (ref 0–35)
AST: 17 U/L (ref 0–37)
Albumin: 4.2 g/dL (ref 3.5–5.2)
Alkaline Phosphatase: 68 U/L (ref 39–117)
BUN: 17 mg/dL (ref 6–23)
CO2: 27 meq/L (ref 19–32)
Calcium: 9.9 mg/dL (ref 8.4–10.5)
Chloride: 100 mEq/L (ref 96–112)
Creatinine, Ser: 0.74 mg/dL (ref 0.40–1.20)
GFR: 110.07 mL/min (ref >60.00)
GLUCOSE: 133 mg/dL — AB (ref 70–99)
Potassium: 4.3 mEq/L (ref 3.5–5.1)
SODIUM: 134 meq/L — AB (ref 135–145)
TOTAL PROTEIN: 7.5 g/dL (ref 6.0–8.3)
Total Bilirubin: 0.7 mg/dL (ref 0.2–1.2)

## 2014-07-31 LAB — LIPID PANEL
Cholesterol: 232 mg/dL — ABNORMAL HIGH (ref 0–200)
HDL: 47.4 mg/dL (ref >39.00)
LDL Cholesterol: 152 mg/dL — ABNORMAL HIGH (ref 0–99)
NONHDL: 184.6
Total CHOL/HDL Ratio: 5
Triglycerides: 161 mg/dL — ABNORMAL HIGH (ref 0.0–149.0)
VLDL: 32.2 mg/dL (ref 0.0–40.0)

## 2014-07-31 LAB — MICROALBUMIN / CREATININE URINE RATIO
CREATININE, U: 72 mg/dL
Microalb Creat Ratio: 1 mg/g (ref 0.0–30.0)

## 2014-07-31 LAB — HEMOGLOBIN A1C: Hgb A1c MFr Bld: 8.2 % — ABNORMAL HIGH (ref 4.6–6.5)

## 2014-07-31 MED ORDER — CANAGLIFLOZIN 300 MG PO TABS: 300.0000 mg | ORAL_TABLET | Freq: Every day | ORAL | Status: DC

## 2014-07-31 MED ORDER — LIRAGLUTIDE 18 MG/3ML ~~LOC~~ SOPN: 1.8000 mg | PEN_INJECTOR | Freq: Every day | SUBCUTANEOUS | Status: DC

## 2014-07-31 NOTE — Assessment & Plan Note (Signed)
Will check lipids with labs. Continue Crestor. 

## 2014-07-31 NOTE — Progress Notes (Signed)
Subjective:    Patient ID: Julia Brown, female    DOB: 12/11/1970, 44 y.o.   MRN: [id number]  HPI  44YO female presents for follow up  DM - Started on Invokana. Tolerating well. BG well controlled. Morning fasting BG near 150. Having some yeast infections on Invokana. Trying to lose weight. Trying to follow healthier diet and get regular physical activity.  BP Readings from Last 3 Encounters:  07/31/14 113/74  06/12/14 134/92  05/15/14 140/82   Wt Readings from Last 3 Encounters:  07/31/14 251 lb 8 oz (114.08 kg)  06/12/14 255 lb (115.667 kg)  05/15/14 263 lb 12 oz (119.636 kg)    Past medical, surgical, family and social history per today's encounter.  Review of Systems  Constitutional: Negative for fever, chills, appetite change, fatigue and unexpected weight change.  Eyes: Negative for visual disturbance.  Respiratory: Negative for shortness of breath.   Cardiovascular: Negative for chest pain and leg swelling.  Gastrointestinal: Negative for nausea, vomiting, abdominal pain, diarrhea and constipation.  Genitourinary: Negative for dysuria and frequency.  Musculoskeletal: Negative for myalgias and arthralgias.  Skin: Negative for color change and rash.  Hematological: Negative for adenopathy. Does not bruise/bleed easily.  Psychiatric/Behavioral: Negative for sleep disturbance and dysphoric mood. The patient is not nervous/anxious.        Objective:    BP 113/74 mmHg  Pulse 83  Temp(Src) 98.3 F (36.8 C) (Oral)  Ht 5\' 10"  (1.778 m)  Wt 251 lb 8 oz (114.08 kg)  BMI 36.09 kg/m2  SpO2 98% Physical Exam  Constitutional: She is oriented to person, place, and time. She appears well-developed and well-nourished. No distress.  HENT:  Head: Normocephalic and atraumatic.  Right Ear: External ear normal.  Left Ear: External ear normal.  Nose: Nose normal.  Mouth/Throat: Oropharynx is clear and moist. No oropharyngeal exudate.  Eyes: Conjunctivae are normal.  Pupils are equal, round, and reactive to light. Right eye exhibits no discharge. Left eye exhibits no discharge. No scleral icterus.  Neck: Normal range of motion. Neck supple. No tracheal deviation present. No thyromegaly present.  Cardiovascular: Normal rate, regular rhythm, normal heart sounds and intact distal pulses.  Exam reveals no gallop and no friction rub.   No murmur heard. Pulmonary/Chest: Effort normal and breath sounds normal. No respiratory distress. She has no wheezes. She has no rales. She exhibits no tenderness.  Musculoskeletal: Normal range of motion. She exhibits no edema or tenderness.  Lymphadenopathy:    She has no cervical adenopathy.  Neurological: She is alert and oriented to person, place, and time. No cranial nerve deficit. She exhibits normal muscle tone. Coordination normal.  Skin: Skin is warm and dry. No rash noted. She is not diaphoretic. No erythema. No pallor.  Psychiatric: She has a normal mood and affect. Her behavior is normal. Judgment and thought content normal.          Assessment & Plan:   Problem List Items Addressed This Visit      Unprioritized   Diabetes type 2, controlled - Primary    Will check A1c with labs. Continue Invokana, Victoza, Metformin and Lantus.      Relevant Medications   Liraglutide 18 MG/3ML SOPN   Other Relevant Orders   Comprehensive metabolic panel   Hemoglobin A1c   Lipid panel   Microalbumin / creatinine urine ratio   Hyperlipidemia    Will check lipids with labs. Continue Crestor.      Obesity (BMI 30-39.9)  Wt Readings from Last 3 Encounters:  07/31/14 251 lb 8 oz (114.08 kg)  06/12/14 255 lb (115.667 kg)  05/15/14 263 lb 12 oz (119.636 kg)   Body mass index is 36.09 kg/(m^2). Encouraged healthy diet and exercise.      Relevant Medications   Liraglutide 18 MG/3ML SOPN    Other Visit Diagnoses    Screening for breast cancer        Relevant Orders    MM Digital Screening        Return in  about 3 months (around 10/31/2014) for Recheck.

## 2014-07-31 NOTE — Progress Notes (Signed)
Pre visit review using our clinic review tool, if applicable. No additional management support is needed unless otherwise documented below in the visit note. 

## 2014-07-31 NOTE — Assessment & Plan Note (Signed)
Will check A1c with labs. Continue Invokana, Victoza, Metformin and Lantus.

## 2014-07-31 NOTE — Patient Instructions (Signed)
Labs today

## 2014-07-31 NOTE — Assessment & Plan Note (Signed)
Wt Readings from Last 3 Encounters:  07/31/14 251 lb 8 oz (114.08 kg)  06/12/14 255 lb (115.667 kg)  05/15/14 263 lb 12 oz (119.636 kg)   Body mass index is 36.09 kg/(m^2). Encouraged healthy diet and exercise.

## 2014-08-14 ENCOUNTER — Ambulatory Visit (INDEPENDENT_AMBULATORY_CARE_PROVIDER_SITE_OTHER): Payer: 59 | Admitting: Endocrinology

## 2014-08-14 ENCOUNTER — Ambulatory Visit: Payer: 59 | Admitting: Internal Medicine

## 2014-08-14 ENCOUNTER — Encounter: Payer: Self-pay | Admitting: Endocrinology

## 2014-08-14 VITALS — BP 122/90 | HR 89 | Resp 14 | Ht 70.0 in | Wt 253.8 lb

## 2014-08-14 DIAGNOSIS — E119 Type 2 diabetes mellitus without complications: Secondary | ICD-10-CM

## 2014-08-14 DIAGNOSIS — E785 Hyperlipidemia, unspecified: Secondary | ICD-10-CM

## 2014-08-14 DIAGNOSIS — E669 Obesity, unspecified: Secondary | ICD-10-CM

## 2014-08-14 LAB — COMPREHENSIVE METABOLIC PANEL
ALT: 20 U/L (ref 0–35)
AST: 17 U/L (ref 0–37)
Albumin: 4.4 g/dL (ref 3.5–5.2)
Alkaline Phosphatase: 72 U/L (ref 39–117)
BUN: 14 mg/dL (ref 6–23)
CALCIUM: 10 mg/dL (ref 8.4–10.5)
CO2: 29 mEq/L (ref 19–32)
Chloride: 100 mEq/L (ref 96–112)
Creatinine, Ser: 0.7 mg/dL (ref 0.40–1.20)
GFR: 117.34 mL/min (ref >60.00)
Glucose, Bld: 155 mg/dL — ABNORMAL HIGH (ref 70–99)
Potassium: 4.8 mEq/L (ref 3.5–5.1)
Sodium: 135 mEq/L (ref 135–145)
TOTAL PROTEIN: 7.2 g/dL (ref 6.0–8.3)
Total Bilirubin: 0.8 mg/dL (ref 0.2–1.2)

## 2014-08-14 NOTE — Progress Notes (Signed)
Pre visit review using our clinic review tool, if applicable. No additional management support is needed unless otherwise documented below in the visit note. 

## 2014-08-14 NOTE — Assessment & Plan Note (Signed)
Encouraged her to take Crestor daily as directed. Recent LDL not at target. Work on dietary factors and lifestyle for now.

## 2014-08-14 NOTE — Patient Instructions (Signed)
Stay hydrated. Labs today.  Try cleaning after each urination with baby wipes.  Continue current medications for now.  Make eye appointment.  Check sugars 2 x daily and bring meters to next appointment.   Please come back for a follow-up appointment in 1 month.

## 2014-08-14 NOTE — Progress Notes (Signed)
Reason for visit-  Julia Brown is a 44 y.o.-year-old female, here for follow up of Type 2 diabetes, uncontrolled, without complications. Last visit March 2016  HPI- Patient has been diagnosed with diabetes in 1994 after being diagnosed with GDM. Recalls going through through DM center classes, 2002 and now is enrolled through [facility] program for [employer] health employees- once monthly meeting. Goes back and forth between [place] and here due to taking care of family as well.  Tried  Metformin, Glipizide,  Januvia, Byetta/Victoza. she has been on insulin since past 10 years.    Pt is currently on a regimen of: - Metformin 500 mg po am and 1000 mg at night time - Lantus 60 units qam - Victoza 1.8 mg daily ( since ~2013) -Invokana 300 mg daily ( start Feb 2016, increased May 2016)  * reports getting weekly yeast infections . Wants to continue Invokana still. Denies stress incontinence.   Last hemoglobin A1c was: Lab Results  Component Value Date   HGBA1C 8.2* 07/31/2014   HGBA1C 8.9* 04/26/2014   HGBA1C 8.0* 01/18/2014     Pt checks her sugars spoardically times weekly . Uses [employer]. By  Sugar log they are improving: Doesn't have time to check sugars due to her jobs  PREMEAL Breakfast Lunch Dinner Bedtime Overall  Glucose range: 140-215, 140-160 mostly      Mean/median:        POST-MEAL PC Breakfast PC Lunch PC Dinner  Glucose range:     Mean/median:       Hypoglycemia-  Lower sugars around lunch in the 60 range. Lowest sugar was 60s; she has hypoglycemia awareness at 70.   Dietary habits- eats 2 times daily- skips BF and lacks appetite at BF after taking Victoza. Tries to limit carbs, sweetened beverages, sodas, desserts. Lately eating out more due to freq travel and taking care of family here and in [place]- some weeks are better than others. Nutrition appt was cancelled by [name] per patient report Exercise- stays active at work. No formal exercise. Works 2 jobs as  well.  Weight - down  Wt Readings from Last 3 Encounters:  08/14/14 253 lb 12 oz (115.1 kg)  07/31/14 251 lb 8 oz (114.08 kg)  06/12/14 255 lb (115.667 kg)    Diabetes Complications-  Nephropathy- No  CKD, last BUN/creatinine-  Lab Results  Component Value Date   BUN 17 07/31/2014   CREATININE 0.74 07/31/2014   Lab Results  Component Value Date   GFR 110.07 07/31/2014   Lab Results  Component Value Date   MICRALBCREAT 1.0 07/31/2014   Retinopathy- No, Last DEE was in March 2015, going to make appt for this year- still hasn't made one Neuropathy- no numbness and tingling in her feet. No known neuropathy.  Associated history - No CAD . No prior stroke. No hypothyroidism. her last TSH was  Lab Results  Component Value Date   TSH 2.17 04/26/2014    Hyperlipidemia-  her last set of lipids were- Currently on Crestor 5 mg daily. Tolerating well.     Lab Results  Component Value Date   CHOL 232* 07/31/2014   HDL 47.40 07/31/2014   LDLCALC 152* 07/31/2014   TRIG 161.0* 07/31/2014   CHOLHDL 5 07/31/2014    Blood Pressure/HTN- Patient's blood pressure is  at target today on current regimen that includes ACE-I.    She is working on quitting smoking. Smoking 1/2 PPD.  I have reviewed the patient's past medical  history,  medications and allergies.   Current Outpatient Prescriptions on File Prior to Visit  Medication Sig Dispense Refill  . canagliflozin (INVOKANA) 300 MG TABS tablet Take 300 mg by mouth daily before breakfast. 30 tablet 3  . carvedilol (COREG) 3.125 MG tablet Take 1 tablet (3.125 mg total) by mouth 2 (two) times daily with a meal. 180 tablet 1  . Insulin Glargine (LANTUS SOLOSTAR) 100 UNIT/ML Solostar Pen Inject 60 Units into the skin daily. 5 pen PRN  . Liraglutide 18 MG/3ML SOPN Inject 0.3 mLs (1.8 mg total) into the skin daily. 6 mL 11  . metFORMIN (GLUCOPHAGE) 500 MG tablet Take 500 mg in the morning and 1000 mg at night time 90 tablet 3  . omeprazole  (PRILOSEC) 40 MG capsule Take 1 capsule (40 mg total) by mouth daily. 30 capsule 3  . quinapril (ACCUPRIL) 20 MG tablet Take 1 tablet (20 mg total) by mouth at bedtime. 30 tablet 0  . rosuvastatin (CRESTOR) 5 MG tablet Take 1 tablet (5 mg total) by mouth daily. 90 tablet 3   No current facility-administered medications on file prior to visit.   Allergies  Allergen Reactions  . Statins   . Penicillins Rash    Review of Systems- [ x ]  Complains of    [  ]  denies [  ] Recent weight change [  ]  Fatigue [  ] polydipsia [  x] polyuria [  ]  nocturia [  ]  vision difficulty [  ] chest pain [  ] shortness of breath [  ] leg swelling [  ] cough [  ] nausea/vomiting [  ] diarrhea [  ] constipation [  ] abdominal pain [  ]  tingling/numbness in extremities [  ]  concern with feet ( wounds/sores)   PE: BP 122/90 mmHg  Pulse 89  Resp 14  Ht 5\' 10"  (1.778 m)  Wt 253 lb 12 oz (115.1 kg)  BMI 36.41 kg/m2  SpO2 97% Wt Readings from Last 3 Encounters:  08/14/14 253 lb 12 oz (115.1 kg)  07/31/14 251 lb 8 oz (114.08 kg)  06/12/14 255 lb (115.667 kg)   Exam: deferred  ASSESSMENT AND PLAN: Problem List Items Addressed This Visit      Endocrine   Diabetes type 2, controlled - Primary    Encouraged to follow with lifestyle clinic and continue with the progress.   Encouraged her to check sugars 2 x daily.  At next visits, will consider switch  to Toujeo to give better insulin profile /better absorption as compared to lantus vs try basal/bolus vs VGO.  Continue increased dose of Invokana for now. Discussed female hygiene. If continues to have repeated yeast infections, then will re-discuss whether she wants to continue the therapy. Currently, she wants to, as she doesn't wish to go on meal time insulin. This might be pursued next visit though so that her sugars and glucosuria could be lowered while on insulin therapy and then could try uptitrating the Invokana then.  Continue current metformin,  Victoza, Lantus for now.    Make f/u eye appointment for DEE.             Relevant Orders   Comprehensive metabolic panel     Other   Hyperlipidemia    Encouraged her to take Crestor daily as directed. Recent LDL not at target. Work on dietary factors and lifestyle for now.  Relevant Orders   Comprehensive metabolic panel   Obesity (BMI 16-10.9)    Discussed about dietary modifications, eating out, working with lifestyle clinic. Current DM medications will also help with weight management. Encouraged to start walking for exercise. Seeing weight reductions after start of Invokana.           Relevant Orders   Comprehensive metabolic panel         - Return to clinic in 1 mo with sugar log/meter.  Brendin Situ [facility] 08/14/2014 10:21 AM

## 2014-08-14 NOTE — Patient Outreach (Signed)
[facility] Care Management  08/14/2014  Julia CrumbMonique T Brown 05/06/1970 [id number]   Patient dropped in today after seeing Dr. Welford RochePhadke.  A1C decreased from 8.9% to 8.2% and patient has lost 13lbs.  She continues to deal with a vaginal yeast infection related to Invokana.  She is taking medication to clear it and wants to stay on the Invokana because she has seen such and improvement.  Reminded Julia Brown that she must decrease high sugar / high carbohydrates as this will contribute to the yeast.  I have also suggested she keep moist wipes with her and to use them each time she visits the restroom. Personal hygiene is imperitive on Invokana.  I will see Julia Brown in July since she sees Dr. Welford RochePhadke this month and in June and Julia Brown sees Ronna PolioJennifer Brown in August.    Susette RacerJulie Sabrina Arriaga, RN, BA, MHA, CDE [facility] Diabetes Coordinator- Link To [facility] Dial:  [phone]  Fax:  [phone] E-mail: [email or web address] [address], [place], [place]  [id number]

## 2014-08-14 NOTE — Assessment & Plan Note (Signed)
Discussed about dietary modifications, eating out, working with lifestyle clinic. Current DM medications will also help with weight management. Encouraged to start walking for exercise. Seeing weight reductions after start of Invokana.

## 2014-08-14 NOTE — Assessment & Plan Note (Addendum)
Encouraged to follow with lifestyle clinic and continue with the progress.   Encouraged her to check sugars 2 x daily.  At next visits, will consider switch  to Toujeo to give better insulin profile /better absorption as compared to lantus vs try basal/bolus vs VGO.  Continue increased dose of Invokana for now. Discussed female hygiene. If continues to have repeated yeast infections, then will re-discuss whether she wants to continue the therapy. Currently, she wants to, as she doesn't wish to go on meal time insulin. This might be pursued next visit though so that her sugars and glucosuria could be lowered while on insulin therapy and then could try uptitrating the Invokana then.  Continue current metformin, Victoza, Lantus for now.    Make f/u eye appointment for DEE.

## 2014-09-13 ENCOUNTER — Ambulatory Visit: Payer: 59 | Admitting: Endocrinology

## 2014-09-25 ENCOUNTER — Ambulatory Visit: Payer: 59

## 2014-10-11 ENCOUNTER — Ambulatory Visit: Admission: RE | Admit: 2014-10-11 | Payer: 59 | Source: Ambulatory Visit

## 2014-10-12 ENCOUNTER — Ambulatory Visit
Admission: RE | Admit: 2014-10-12 | Discharge: 2014-10-12 | Disposition: A | Discharge status: Home / Self Care | Payer: 59 | Source: Ambulatory Visit | Attending: Family | Admitting: Family

## 2014-10-12 ENCOUNTER — Other Ambulatory Visit: Payer: Self-pay | Admitting: Family

## 2014-10-12 DIAGNOSIS — M19012 Primary osteoarthritis, left shoulder: Secondary | ICD-10-CM | POA: Insufficient documentation

## 2014-10-12 DIAGNOSIS — R52 Pain, unspecified: Secondary | ICD-10-CM

## 2014-10-12 DIAGNOSIS — M25512 Pain in left shoulder: Secondary | ICD-10-CM | POA: Diagnosis present

## 2014-10-15 ENCOUNTER — Other Ambulatory Visit: Payer: 59

## 2014-10-29 ENCOUNTER — Other Ambulatory Visit: Payer: Self-pay

## 2014-10-29 NOTE — Patient Outreach (Signed)
[facility]) Care Management  10/29/2014  ANJA NEUZIL [birth year] [id number]  Patient was unavailable for her 10/15/14 visit call.  Lun works in the same building as I do so I went to her desk to clarify she was ready for the visit- he had gone out for lunch and was not available.  I have not heard from her regarding the missed "visit". Will follow up to try and reschedule.   Susette Racer, RN, BA, MHA, CDE [facility] Diabetes Coordinator- Link To [employer] Dial:  [phone]  Fax:  [phone] E-mail: [name] [address] [place], [place]  [address]

## 2014-10-29 NOTE — Patient Outreach (Signed)
[facility]) Care Management  10/29/2014  Julia Brown 08/13/1970 [id number]   E-mailed patient to remind her of missed appointment on 10/15/14 and requested her to call me to reschedule.    Susette Racer, RN, BA, MHA, CDE [facility] Diabetes Coordinator- Link To [employer] Dial:  [phone]  Fax:  [phone] E-mail: [name] [address] [place], [place]  [address]

## 2014-11-01 ENCOUNTER — Ambulatory Visit (INDEPENDENT_AMBULATORY_CARE_PROVIDER_SITE_OTHER): Payer: 59 | Admitting: Internal Medicine

## 2014-11-01 ENCOUNTER — Encounter: Payer: Self-pay | Admitting: Internal Medicine

## 2014-11-01 VITALS — BP 130/82 | HR 74 | Temp 97.9°F | Wt 241.0 lb

## 2014-11-01 DIAGNOSIS — E785 Hyperlipidemia, unspecified: Secondary | ICD-10-CM

## 2014-11-01 DIAGNOSIS — B373 Candidiasis of vulva and vagina: Secondary | ICD-10-CM

## 2014-11-01 DIAGNOSIS — M25512 Pain in left shoulder: Secondary | ICD-10-CM | POA: Diagnosis not present

## 2014-11-01 DIAGNOSIS — E119 Type 2 diabetes mellitus without complications: Secondary | ICD-10-CM

## 2014-11-01 DIAGNOSIS — B3731 Acute candidiasis of vulva and vagina: Secondary | ICD-10-CM

## 2014-11-01 DIAGNOSIS — M25519 Pain in unspecified shoulder: Secondary | ICD-10-CM | POA: Insufficient documentation

## 2014-11-01 LAB — COMPREHENSIVE METABOLIC PANEL
ALBUMIN: 4.4 g/dL (ref 3.5–5.2)
ALK PHOS: 61 U/L (ref 39–117)
ALT: 21 U/L (ref 0–35)
AST: 21 U/L (ref 0–37)
BILIRUBIN TOTAL: 0.6 mg/dL (ref 0.2–1.2)
BUN: 19 mg/dL (ref 6–23)
CO2: 28 mEq/L (ref 19–32)
Calcium: 9.9 mg/dL (ref 8.4–10.5)
Chloride: 100 mEq/L (ref 96–112)
Creatinine, Ser: 0.72 mg/dL (ref 0.40–1.20)
GFR: 113.47 mL/min (ref >60.00)
Glucose, Bld: 87 mg/dL (ref 70–99)
POTASSIUM: 4.5 meq/L (ref 3.5–5.1)
Sodium: 136 mEq/L (ref 135–145)
TOTAL PROTEIN: 7.6 g/dL (ref 6.0–8.3)

## 2014-11-01 LAB — HEMOGLOBIN A1C: HEMOGLOBIN A1C: 6.9 % — AB (ref 4.6–6.5)

## 2014-11-01 LAB — LIPID PANEL
CHOL/HDL RATIO: 3
Cholesterol: 235 mg/dL — ABNORMAL HIGH (ref 0–200)
HDL: 70.3 mg/dL (ref >39.00)
LDL Cholesterol: 154 mg/dL — ABNORMAL HIGH (ref 0–99)
NONHDL: 165.16
TRIGLYCERIDES: 54 mg/dL (ref 0.0–149.0)
VLDL: 10.8 mg/dL (ref 0.0–40.0)

## 2014-11-01 LAB — MICROALBUMIN / CREATININE URINE RATIO
CREATININE, U: 93 mg/dL
Microalb Creat Ratio: 0.8 mg/g (ref 0.0–30.0)
Microalb, Ur: <0.7 mg/dL (ref 0.0–1.9)

## 2014-11-01 MED ORDER — FLUCONAZOLE 150 MG PO TABS: 150.0000 mg | ORAL_TABLET | ORAL | Status: DC

## 2014-11-01 NOTE — Assessment & Plan Note (Signed)
Will check lipids with labs. Continue Crestor. 

## 2014-11-01 NOTE — Assessment & Plan Note (Signed)
Left shoulder pain most c/w adhesive capsulitis. Will request notes from ortho. Continue Meloxicam. Encouraged PT.

## 2014-11-01 NOTE — Assessment & Plan Note (Signed)
Start Diflucan weekly x 6 weeks.

## 2014-11-01 NOTE — Progress Notes (Signed)
Subjective:    Patient ID: Julia Brown, female    DOB: 1970/06/07, 44 y.o.   MRN: [id number]  HPI  44YO female presents for follow up.  DM - BG have been fluctuating. BG during day near 120s. Lowest in 70s. Occasional near 170. Compliant with meds. Frustrated by recurrent yeast infectsion. Takes Diflucan, with resolution, then recurs. Continues to have vaginal discharge and itching.  Wt Readings from Last 3 Encounters:  11/01/14 241 lb (109.317 kg)  08/14/14 253 lb 12 oz (115.1 kg)  07/31/14 251 lb 8 oz (114.08 kg)     Arthralgia - Having pain in shoulders and neck. Most prominent in left shoulder. Aching. Seen by Ortho, Dr. Martha Clan. Started on Meloxicam and given steroid shot in left shoulder last week. Some improvement with this. Xrays were performed. Physical therapy was ordered. Also tried Flexeril, but could not tolerate. Also has Tramadol, but does not use.  Past Medical History  Diagnosis Date  . Diabetes mellitus   . Hyperlipidemia   . Hypertension   . Chicken pox   . GERD (gastroesophageal reflux disease)   . UTI (lower urinary tract infection)    Family History  Problem Relation Age of Onset  . Heart disease Father   . Hyperlipidemia Father   . Diabetes Father   . Hyperlipidemia Mother   . Hypertension Mother   . Schizophrenia Mother   . Depression Mother   . Diabetes Mother   . Cancer Maternal Aunt     breast  . Diabetes Maternal Grandmother   . Diabetes Maternal Grandfather   . Heart disease Other    Past Surgical History  Procedure Laterality Date  . Tubal ligation    . Uterine ablation    . Cholecystectomy  1993  . Cesarean section  1994  . Ablation  2007    excessive bleeding  . Orif femur fracture Right     [place]   Social History   Social History  . Marital Status: Married    Spouse Name: N/A  . Number of Children: N/A  . Years of Education: N/A   Social History Main Topics  . Smoking status: Current Every Day Smoker -- 0.50  packs/day for 10 years    Types: Cigarettes  . Smokeless tobacco: Never Used     Comment: smoked < 1 pack a day  . Alcohol Use: Yes     Comment: weekend  . Drug Use: No  . Sexual Activity: Not Asked   Other Topics Concern  . None   Social History Narrative   Lives in [place].      Work - [employer]      Diet - regular    Review of Systems  Constitutional: Negative for fever, chills, appetite change, fatigue and unexpected weight change.  Eyes: Negative for visual disturbance.  Respiratory: Negative for shortness of breath.   Cardiovascular: Negative for chest pain and leg swelling.  Gastrointestinal: Negative for nausea, vomiting, abdominal pain, diarrhea and constipation.  Genitourinary: Positive for vaginal discharge and vaginal pain. Negative for dysuria, frequency, vaginal bleeding and menstrual problem.  Musculoskeletal: Positive for myalgias, back pain, arthralgias and neck pain. Negative for joint swelling and neck stiffness.  Skin: Negative for color change and rash.  Hematological: Negative for adenopathy. Does not bruise/bleed easily.  Psychiatric/Behavioral: Negative for sleep disturbance and dysphoric mood. The patient is not nervous/anxious.        Objective:    BP 130/82 mmHg  Pulse  74  Temp(Src) 97.9 F (36.6 C) (Oral)  Wt 241 lb (109.317 kg)  SpO2 99% Physical Exam  Constitutional: She is oriented to person, place, and time. She appears well-developed and well-nourished. No distress.  HENT:  Head: Normocephalic and atraumatic.  Right Ear: External ear normal.  Left Ear: External ear normal.  Nose: Nose normal.  Mouth/Throat: Oropharynx is clear and moist. No oropharyngeal exudate.  Eyes: Conjunctivae are normal. Pupils are equal, round, and reactive to light. Right eye exhibits no discharge. Left eye exhibits no discharge. No scleral icterus.  Neck: Normal range of motion. Neck supple. No tracheal deviation present. No thyromegaly  present.  Cardiovascular: Normal rate, regular rhythm, normal heart sounds and intact distal pulses.  Exam reveals no gallop and no friction rub.   No murmur heard. Pulmonary/Chest: Effort normal and breath sounds normal. No respiratory distress. She has no wheezes. She has no rales. She exhibits no tenderness.  Musculoskeletal: She exhibits no edema.       Left shoulder: She exhibits decreased range of motion, tenderness and pain (with passive movement). She exhibits no bony tenderness, no swelling, no deformity and normal strength.  Lymphadenopathy:    She has no cervical adenopathy.  Neurological: She is alert and oriented to person, place, and time. No cranial nerve deficit. She exhibits normal muscle tone. Coordination normal.  Skin: Skin is warm and dry. No rash noted. She is not diaphoretic. No erythema. No pallor.  Psychiatric: She has a normal mood and affect. Her behavior is normal. Judgment and thought content normal.          Assessment & Plan:   Problem List Items Addressed This Visit      Unprioritized   Candidal vaginitis    Start Diflucan weekly x 6 weeks.      Relevant Medications   fluconazole (DIFLUCAN) 150 MG tablet   Diabetes type 2, controlled - Primary    Will check A1c with labs. Doing well in regards to BG and weight with addition of Invokana, however having recurrent yeast infections. Will start Diflucan weekly x6 weeks. She will follow up by email.      Relevant Orders   Comprehensive metabolic panel   Hemoglobin A1c   Microalbumin / creatinine urine ratio   Lipid panel   Hyperlipidemia    Will check lipids with labs. Continue Crestor.      Left shoulder pain    Left shoulder pain most c/w adhesive capsulitis. Will request notes from ortho. Continue Meloxicam. Encouraged PT.          Return in about 3 months (around 02/01/2015) for Recheck.

## 2014-11-01 NOTE — Assessment & Plan Note (Signed)
Will check A1c with labs. Doing well in regards to BG and weight with addition of Invokana, however having recurrent yeast infections. Will start Diflucan weekly x6 weeks. She will follow up by email.

## 2014-11-01 NOTE — Progress Notes (Signed)
Pre visit review using our clinic review tool, if applicable. No additional management support is needed unless otherwise documented below in the visit note. 

## 2014-11-01 NOTE — Patient Instructions (Signed)
Start Diflucan  weekly x 6 weeks.  Labs today.

## 2014-11-12 ENCOUNTER — Other Ambulatory Visit: Payer: Self-pay

## 2014-11-12 VITALS — BP 120/70 | Ht 71.0 in | Wt 255.0 lb

## 2014-11-12 DIAGNOSIS — E119 Type 2 diabetes mellitus without complications: Secondary | ICD-10-CM

## 2014-11-12 NOTE — Patient Outreach (Signed)
[facility]) Care Management  [facility] Care Manager  11/12/2014   Julia Brown June 25, 1970 [id number]  Subjective: Patient very pleased with recent labs from MD on 11/01/14- A1C the lowest it has ever been- she attributes this to consistently taking her medication and weight loss.  She is not exercising. Patient complains of frequent yeast infections related to Theodis Sato- MD has started her on weekly Diflucan.   Objective:  Filed Vitals:   11/12/14 1528  BP: 120/70  Height: 1.803 m (5\' 11" )  Weight: 255 lb (115.667 kg)     Current Medications:  Current Outpatient Prescriptions  Medication Sig Dispense Refill  . canagliflozin (INVOKANA) 300 MG TABS tablet Take 300 mg by mouth daily before breakfast. 30 tablet 3  . carvedilol (COREG) 3.125 MG tablet Take 1 tablet (3.125 mg total) by mouth 2 (two) times daily with a meal. 180 tablet 1  . fluconazole (DIFLUCAN) 150 MG tablet Take 1 tablet (150 mg total) by mouth every 7 (seven) days. 6 tablet 0  . Insulin Glargine (LANTUS SOLOSTAR) 100 UNIT/ML Solostar Pen Inject 60 Units into the skin daily. 5 pen PRN  . Liraglutide 18 MG/3ML SOPN Inject 0.3 mLs (1.8 mg total) into the skin daily. 6 mL 11  . meloxicam (MOBIC) 15 MG tablet Take 15 mg by mouth daily.    . metFORMIN (GLUCOPHAGE) 500 MG tablet Take 500 mg in the morning and 1000 mg at night time (Patient taking differently: 500 mg 2 (two) times daily with a meal. ) 90 tablet 3  . omeprazole (PRILOSEC) 40 MG capsule Take 1 capsule (40 mg total) by mouth daily. 30 capsule 3  . quinapril (ACCUPRIL) 20 MG tablet Take 1 tablet (20 mg total) by mouth at bedtime. 30 tablet 0  . rosuvastatin (CRESTOR) 5 MG tablet Take 1 tablet (5 mg total) by mouth daily. (Patient not taking: Reported on 11/12/2014) 90 tablet 3   No current facility-administered medications for this visit.    Functional Status:  In your present state of health, do you have any difficulty performing the following  activities: 11/12/2014  Hearing? N  Vision? N  Difficulty concentrating or making decisions? N  Walking or climbing stairs? N  Dressing or bathing? N  Doing errands, shopping? N    Fall/Depression Screening: PHQ 2/9 Scores 11/12/2014  PHQ - 2 Score 0    Assessment: A1C 6.9%.  She is not ready to stop smoking or begin exercising.  We discussed the need to eat every 4 hours on the weekends to prevent hypo-glycemia which she has had 3 times in the last week because she has gone long periods without eating. Reviewed the risks of hypoglycemia.  Reinforced the need for impeccable personal hygiene and a low carbohydrate diet to decrease the incidence of yeast.  Plan:  [facility] CM Care Plan Problem One        Patient Outreach from 11/12/2014 in [facility] [employer]   Care Plan Problem One  Potential for elevated A1C   Care Plan for Problem One  Active   [facility] Long Term Goal (31-90 days)  Patient will maintain an A1C between 6.5-7.0% when checked by MD this fall   [facility] Long Term Goal Start Date  11/12/14   Interventions for Problem One Long Term Goal  1. Check sugar a minimum of three times per week  2. Take meds as ordered  3. See MD in 3 months      Follow up with me in approximately  3 months.    Julia Racer, RN, BA, MHA, CDE [facility] Diabetes Coordinator- Link To [employer] Dial:  [phone]  Fax:  [phone] E-mail: [name] [address], [place], [place]  [address]

## 2014-11-12 NOTE — Patient Instructions (Signed)

## 2014-11-28 ENCOUNTER — Ambulatory Visit: Payer: 59

## 2014-12-13 ENCOUNTER — Other Ambulatory Visit: Payer: Self-pay | Admitting: Endocrinology

## 2014-12-18 ENCOUNTER — Encounter: Payer: Self-pay | Admitting: Physician Assistant

## 2014-12-18 ENCOUNTER — Ambulatory Visit: Payer: Self-pay | Admitting: Physician Assistant

## 2014-12-18 VITALS — BP 110/88 | Temp 97.7°F

## 2014-12-18 DIAGNOSIS — R300 Dysuria: Secondary | ICD-10-CM

## 2014-12-18 LAB — POCT URINALYSIS DIPSTICK
Bilirubin, UA: NEGATIVE
Blood, UA: NEGATIVE
Ketones, UA: NEGATIVE
LEUKOCYTES UA: NEGATIVE
Nitrite, UA: NEGATIVE
Protein, UA: NEGATIVE
SPEC GRAV UA: 1.015
UROBILINOGEN UA: 0.2
pH, UA: 5.5

## 2014-12-18 NOTE — Progress Notes (Signed)
S:  C/o uti sx for 2 days, burning, itching, frequency, denies vaginal discharge, abdominal pain or flank pain:  Remainder ros neg  O:  Vitals wnl, nad, no cva tenderness, back nontender, lungs c t a,cv rrr, abd soft nontender, bs normal, n/v intact  A: uti  P: cipro  bid x 7d, increase water intake, add cranberry juice, return if not improving in 2 -3 days, return earlier if worsening, discussed pyelonephritis sx

## 2014-12-27 ENCOUNTER — Ambulatory Visit: Payer: Self-pay | Admitting: Family Medicine

## 2014-12-27 DIAGNOSIS — Z0289 Encounter for other administrative examinations: Secondary | ICD-10-CM

## 2015-01-31 ENCOUNTER — Encounter: Payer: Self-pay | Admitting: *Deleted

## 2015-01-31 ENCOUNTER — Other Ambulatory Visit (HOSPITAL_COMMUNITY)
Admission: RE | Admit: 2015-01-31 | Discharge: 2015-01-31 | Disposition: A | Discharge status: Home / Self Care | Payer: 59 | Source: Ambulatory Visit | Attending: Internal Medicine | Admitting: Internal Medicine

## 2015-01-31 ENCOUNTER — Encounter: Payer: Self-pay | Admitting: Internal Medicine

## 2015-01-31 ENCOUNTER — Ambulatory Visit (INDEPENDENT_AMBULATORY_CARE_PROVIDER_SITE_OTHER): Payer: 59 | Admitting: Internal Medicine

## 2015-01-31 VITALS — BP 154/98 | HR 83 | Temp 98.3°F | Ht 70.0 in | Wt 255.5 lb

## 2015-01-31 DIAGNOSIS — Z0000 Encounter for general adult medical examination without abnormal findings: Secondary | ICD-10-CM | POA: Diagnosis not present

## 2015-01-31 DIAGNOSIS — Z1151 Encounter for screening for human papillomavirus (HPV): Secondary | ICD-10-CM | POA: Diagnosis not present

## 2015-01-31 DIAGNOSIS — N760 Acute vaginitis: Secondary | ICD-10-CM | POA: Insufficient documentation

## 2015-01-31 DIAGNOSIS — Z113 Encounter for screening for infections with a predominantly sexual mode of transmission: Secondary | ICD-10-CM | POA: Diagnosis present

## 2015-01-31 DIAGNOSIS — Z720 Tobacco use: Secondary | ICD-10-CM

## 2015-01-31 DIAGNOSIS — I10 Essential (primary) hypertension: Secondary | ICD-10-CM

## 2015-01-31 DIAGNOSIS — Z01419 Encounter for gynecological examination (general) (routine) without abnormal findings: Secondary | ICD-10-CM | POA: Insufficient documentation

## 2015-01-31 DIAGNOSIS — E119 Type 2 diabetes mellitus without complications: Secondary | ICD-10-CM

## 2015-01-31 LAB — COMPREHENSIVE METABOLIC PANEL
ALBUMIN: 4.2 g/dL (ref 3.5–5.2)
ALT: 22 U/L (ref 0–35)
AST: 20 U/L (ref 0–37)
Alkaline Phosphatase: 60 U/L (ref 39–117)
BUN: 13 mg/dL (ref 6–23)
CALCIUM: 9.5 mg/dL (ref 8.4–10.5)
CHLORIDE: 100 meq/L (ref 96–112)
CO2: 27 mEq/L (ref 19–32)
CREATININE: 0.74 mg/dL (ref 0.40–1.20)
GFR: 109.81 mL/min (ref >60.00)
Glucose, Bld: 116 mg/dL — ABNORMAL HIGH (ref 70–99)
POTASSIUM: 3.9 meq/L (ref 3.5–5.1)
Sodium: 136 mEq/L (ref 135–145)
Total Bilirubin: 0.7 mg/dL (ref 0.2–1.2)
Total Protein: 7.1 g/dL (ref 6.0–8.3)

## 2015-01-31 LAB — CBC WITH DIFFERENTIAL/PLATELET
BASOS PCT: 0.6 % (ref 0.0–3.0)
Basophils Absolute: 0.1 10*3/uL (ref 0.0–0.1)
EOS PCT: 1.3 % (ref 0.0–5.0)
Eosinophils Absolute: 0.1 10*3/uL (ref 0.0–0.7)
HEMATOCRIT: 42.8 % (ref 36.0–46.0)
HEMOGLOBIN: 14 g/dL (ref 12.0–15.0)
Lymphocytes Relative: 22.6 % (ref 12.0–46.0)
Lymphs Abs: 2.4 10*3/uL (ref 0.7–4.0)
MCHC: 32.8 g/dL (ref 30.0–36.0)
MCV: 89.9 fl (ref 78.0–100.0)
MONO ABS: 0.7 10*3/uL (ref 0.1–1.0)
Monocytes Relative: 6.4 % (ref 3.0–12.0)
NEUTROS ABS: 7.4 10*3/uL (ref 1.4–7.7)
Neutrophils Relative %: 69.1 % (ref 43.0–77.0)
Platelets: 300 10*3/uL (ref 150.0–400.0)
RBC: 4.76 Mil/uL (ref 3.87–5.11)
RDW: 13.1 % (ref 11.5–15.5)
WBC: 10.7 10*3/uL — AB (ref 4.0–10.5)

## 2015-01-31 LAB — LIPID PANEL
Cholesterol: 234 mg/dL — ABNORMAL HIGH (ref 0–200)
HDL: 57.2 mg/dL (ref >39.00)
LDL Cholesterol: 156 mg/dL — ABNORMAL HIGH (ref 0–99)
NonHDL: 176.51
Total CHOL/HDL Ratio: 4
Triglycerides: 105 mg/dL (ref 0.0–149.0)
VLDL: 21 mg/dL (ref 0.0–40.0)

## 2015-01-31 LAB — HEMOGLOBIN A1C: HEMOGLOBIN A1C: 6.2 % (ref 4.6–6.5)

## 2015-01-31 LAB — MICROALBUMIN / CREATININE URINE RATIO
Creatinine,U: 75.1 mg/dL
Microalb Creat Ratio: 0.9 mg/g (ref 0.0–30.0)

## 2015-01-31 LAB — TSH: TSH: 1.81 u[IU]/mL (ref 0.35–4.50)

## 2015-01-31 MED ORDER — FLUCONAZOLE 150 MG PO TABS: 150.0000 mg | ORAL_TABLET | ORAL | Status: DC

## 2015-01-31 NOTE — Assessment & Plan Note (Signed)
Encouraged smoking cessation. Pt is not ready to quit. 

## 2015-01-31 NOTE — Progress Notes (Signed)
Pre visit review using our clinic review tool, if applicable. No additional management support is needed unless otherwise documented below in the visit note. 

## 2015-01-31 NOTE — Assessment & Plan Note (Signed)
Will check A1c with labs. 

## 2015-01-31 NOTE — Assessment & Plan Note (Signed)
BP Readings from Last 3 Encounters:  01/31/15 154/98  12/18/14 110/88  11/12/14 120/70   BP elevated today, however generally well controlled. 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 Follow up in 3 months and sooner if BP consistently >140/90

## 2015-01-31 NOTE — Addendum Note (Signed)
Addended by: Montine CircleMALDONADO, Jahseh Lucchese D on: 01/31/2015 08:07 AM   Modules accepted: Orders

## 2015-01-31 NOTE — Addendum Note (Signed)
Addended by: Ronna PolioWALKER, Garnette Greb A on: 01/31/2015 08:05 AM   Modules accepted: Kipp BroodSmartSet

## 2015-01-31 NOTE — Progress Notes (Addendum)
Subjective:    Patient ID: Julia Brown, female    DOB: [name], 44 y.o.   MRN: [id number]  HPI  44YO female presents for annual exam.  Notes some chaffing in groin from scratching this area. Occasional whitish vaginal discharge. No vaginal pain or odor.  HTN - BP typically 110systolic when checked at work. Compliant with meds.  DM - Compliant with medication. Did not bring record of BG today.  Increased stress caring for family and going through separation.   Wt Readings from Last 3 Encounters:  01/31/15 255 lb 8 oz (115.894 kg)  11/12/14 255 lb (115.667 kg)  11/01/14 241 lb (109.317 kg)   BP Readings from Last 3 Encounters:  01/31/15 154/98  12/18/14 110/88  11/12/14 120/70    Past Medical History  Diagnosis Date  . Diabetes mellitus   . Hyperlipidemia   . Hypertension   . Chicken pox   . GERD (gastroesophageal reflux disease)   . UTI (lower urinary tract infection)    Family History  Problem Relation Age of Onset  . Heart disease Father   . Hyperlipidemia Father   . Diabetes Father   . Hyperlipidemia Mother   . Hypertension Mother   . Schizophrenia Mother   . Depression Mother   . Diabetes Mother   . Cancer Maternal Aunt     breast  . Diabetes Maternal Grandmother   . Diabetes Maternal Grandfather   . Heart disease Other    Past Surgical History  Procedure Laterality Date  . Tubal ligation    . Uterine ablation    . Cholecystectomy  1993  . Cesarean section  1994  . Ablation  2007    excessive bleeding  . Orif femur fracture Right     [place]   Social History   Social History  . Marital Status: Married    Spouse Name: N/A  . Number of Children: N/A  . Years of Education: N/A   Social History Main Topics  . Smoking status: Current Every Day Smoker -- 0.50 packs/day for 10 years    Types: Cigarettes  . Smokeless tobacco: Never Used     Comment: smoked < 1 pack a day  . Alcohol Use: 0.0 oz/week    0 Standard drinks or equivalent  per week     Comment: weekend 3-4drinks  . Drug Use: No  . Sexual Activity: Yes    Birth Control/ Protection: None   Other Topics Concern  . None   Social History Narrative   [place] in [place]      Work - [employer]      Diet - regular    Review of Systems  Constitutional: Negative for fever, chills, appetite change, fatigue and unexpected weight change.  Eyes: Negative for visual disturbance.  Respiratory: Negative for shortness of breath and wheezing.   Cardiovascular: Negative for chest pain, palpitations and leg swelling.  Gastrointestinal: Negative for nausea, vomiting, abdominal pain, diarrhea and constipation.  Genitourinary: Positive for vaginal discharge. Negative for dysuria, vaginal bleeding, vaginal pain, menstrual problem, pelvic pain and dyspareunia.  Musculoskeletal: Positive for arthralgias (shoulders bilaterally). Negative for myalgias.  Skin: Negative for color change and rash.  Hematological: Negative for adenopathy. Does not bruise/bleed easily.  Psychiatric/Behavioral: Negative for suicidal ideas, sleep disturbance and dysphoric mood. The patient is not nervous/anxious.        Objective:    BP 154/98 mmHg  Pulse 83  Temp(Src) 98.3 F (36.8 C) (Oral)  Ht   (1.778 m)  Wt 255 lb 8 oz (115.894 kg)  BMI 36.66 kg/m2  SpO2 99% Physical Exam  Constitutional: She is oriented to person, place, and time. She appears well-developed and well-nourished. No distress.  HENT:  Head: Normocephalic and atraumatic.  Right Ear: External ear normal.  Left Ear: External ear normal.  Nose: Nose normal.  Mouth/Throat: Oropharynx is clear and moist. No oropharyngeal exudate.  Eyes: Conjunctivae are normal. Pupils are equal, round, and reactive to light. Right eye exhibits no discharge. Left eye exhibits no discharge. No scleral icterus.  Neck: Normal range of motion. Neck supple. No tracheal deviation present. No thyromegaly present.  Cardiovascular:  Normal rate, regular rhythm, normal heart sounds and intact distal pulses.  Exam reveals no gallop and no friction rub.   No murmur heard. Pulmonary/Chest: Effort normal and breath sounds normal. No respiratory distress. She has no wheezes. She has no rales. She exhibits no tenderness.  Abdominal: Soft. Bowel sounds are normal. She exhibits no distension and no mass. There is no tenderness. There is no rebound and no guarding.  Genitourinary: Rectum normal and uterus normal. No breast swelling, tenderness, discharge or bleeding. Pelvic exam was performed with patient supine. There is no rash, tenderness or lesion on the right labia. There is no rash, tenderness or lesion on the left labia. Uterus is not enlarged and not tender. Cervix exhibits discharge. Cervix exhibits no motion tenderness and no friability. Right adnexum displays no mass, no tenderness and no fullness. Left adnexum displays no mass, no tenderness and no fullness. No erythema or tenderness in the vagina. Vaginal discharge (white thick discharge externally and yellow thick discharge around cervix) found.  Musculoskeletal: Normal range of motion. She exhibits no edema or tenderness.  Lymphadenopathy:    She has no cervical adenopathy.  Neurological: She is alert and oriented to person, place, and time. No cranial nerve deficit. She exhibits normal muscle tone. Coordination normal.  Skin: Skin is warm and dry. No rash noted. She is not diaphoretic. No erythema. No pallor.  Psychiatric: She has a normal mood and affect. Her behavior is normal. Judgment and thought content normal.          Assessment & Plan:   Problem List Items Addressed This Visit      Unprioritized   Diabetes type 2, controlled (HCC)    Will check A1c with labs.      Relevant Orders   Hemoglobin A1c   Essential hypertension    BP Readings from Last 3 Encounters:  01/31/15 154/98  12/18/14 110/88  11/12/14 120/70   BP elevated today, however generally  well controlled. 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 Follow up in 3 months and sooner if BP consistently >140/90      Routine general medical examination at a health care facility - Primary    General medical exam normal today except as noted. PAP pending. Mammogram ordered. Labs ordered. Encouraged healthy diet and exercise.Immunizations are UTD.      Relevant Orders   Comprehensive metabolic panel   Lipid panel   Microalbumin / creatinine urine ratio   CBC with Differential/Platelet   TSH   Tobacco abuse    Encouraged smoking cessation. Pt is not ready to quit.          Return in about 3 months (around 05/03/2015) for Recheck of Diabetes.

## 2015-01-31 NOTE — Patient Instructions (Signed)
Health Maintenance, Female Adopting a healthy lifestyle and getting preventive care can go a long way to promote health and wellness. Talk with your health care provider about what schedule of regular examinations is right for you. This is a good chance for you to check in with your provider about disease prevention and staying healthy. In between checkups, there are plenty of things you can do on your own. Experts have done a lot of research about which lifestyle changes and preventive measures are most likely to keep you healthy. Ask your health care provider for more information. WEIGHT AND DIET  Eat a healthy diet  Be sure to include plenty of vegetables, fruits, low-fat dairy products, and lean protein.  Do not eat a lot of foods high in solid fats, added sugars, or salt.  Get regular exercise. This is one of the most important things you can do for your health.  Most adults should exercise for at least 150 minutes each week. The exercise should increase your heart rate and make you sweat (moderate-intensity exercise).  Most adults should also do strengthening exercises at least twice a week. This is in addition to the moderate-intensity exercise.  Maintain a healthy weight  Body mass index (BMI) is a measurement that can be used to identify possible weight problems. It estimates body fat based on height and weight. Your health care provider can help determine your BMI and help you achieve or maintain a healthy weight.  For females 20 years of age and older:   A BMI below 18.5 is considered underweight.  A BMI of 18.5 to 24.9 is normal.  A BMI of 25 to 29.9 is considered overweight.  A BMI of 30 and above is considered obese.  Watch levels of cholesterol and blood lipids  You should start having your blood tested for lipids and cholesterol at 43 years of age, then have this test every 5 years.  You may need to have your cholesterol levels checked more often if:  Your lipid  or cholesterol levels are high.  You are older than 43 years of age.  You are at high risk for heart disease.  CANCER SCREENING   Lung Cancer  Lung cancer screening is recommended for adults 55-80 years old who are at high risk for lung cancer because of a history of smoking.  A yearly low-dose CT scan of the lungs is recommended for people who:  Currently smoke.  Have quit within the past 15 years.  Have at least a 30-pack-year history of smoking. A pack year is smoking an average of one pack of cigarettes a day for 1 year.  Yearly screening should continue until it has been 15 years since you quit.  Yearly screening should stop if you develop a health problem that would prevent you from having lung cancer treatment.  Breast Cancer  Practice breast self-awareness. This means understanding how your breasts normally appear and feel.  It also means doing regular breast self-exams. Let your health care provider know about any changes, no matter how small.  If you are in your 20s or 30s, you should have a clinical breast exam (CBE) by a health care provider every 1-3 years as part of a regular health exam.  If you are 40 or older, have a CBE every year. Also consider having a breast X-ray (mammogram) every year.  If you have a family history of breast cancer, talk to your health care provider about genetic screening.  If you   are at high risk for breast cancer, talk to your health care provider about having an MRI and a mammogram every year.  Breast cancer gene (BRCA) assessment is recommended for women who have family members with BRCA-related cancers. BRCA-related cancers include:  Breast.  Ovarian.  Tubal.  Peritoneal cancers.  Results of the assessment will determine the need for genetic counseling and BRCA1 and BRCA2 testing. Cervical Cancer Your health care provider may recommend that you be screened regularly for cancer of the pelvic organs (ovaries, uterus, and  vagina). This screening involves a pelvic examination, including checking for microscopic changes to the surface of your cervix (Pap test). You may be encouraged to have this screening done every 3 years, beginning at age 21.  For women ages 30-65, health care providers may recommend pelvic exams and Pap testing every 3 years, or they may recommend the Pap and pelvic exam, combined with testing for human papilloma virus (HPV), every 5 years. Some types of HPV increase your risk of cervical cancer. Testing for HPV may also be done on women of any age with unclear Pap test results.  Other health care providers may not recommend any screening for nonpregnant women who are considered low risk for pelvic cancer and who do not have symptoms. Ask your health care provider if a screening pelvic exam is right for you.  If you have had past treatment for cervical cancer or a condition that could lead to cancer, you need Pap tests and screening for cancer for at least 20 years after your treatment. If Pap tests have been discontinued, your risk factors (such as having a new sexual partner) need to be reassessed to determine if screening should resume. Some women have medical problems that increase the chance of getting cervical cancer. In these cases, your health care provider may recommend more frequent screening and Pap tests. Colorectal Cancer  This type of cancer can be detected and often prevented.  Routine colorectal cancer screening usually begins at 43 years of age and continues through 43 years of age.  Your health care provider may recommend screening at an earlier age if you have risk factors for colon cancer.  Your health care provider may also recommend using home test kits to check for hidden blood in the stool.  A small camera at the end of a tube can be used to examine your colon directly (sigmoidoscopy or colonoscopy). This is done to check for the earliest forms of colorectal  cancer.  Routine screening usually begins at age 50.  Direct examination of the colon should be repeated every 5-10 years through 43 years of age. However, you may need to be screened more often if early forms of precancerous polyps or small growths are found. Skin Cancer  Check your skin from head to toe regularly.  Tell your health care provider about any new moles or changes in moles, especially if there is a change in a mole's shape or color.  Also tell your health care provider if you have a mole that is larger than the size of a pencil eraser.  Always use sunscreen. Apply sunscreen liberally and repeatedly throughout the day.  Protect yourself by wearing long sleeves, pants, a wide-brimmed hat, and sunglasses whenever you are outside. HEART DISEASE, DIABETES, AND HIGH BLOOD PRESSURE   High blood pressure causes heart disease and increases the risk of stroke. High blood pressure is more likely to develop in:  People who have blood pressure in the high end   of the normal range (130-139/85-89 mm Hg).  People who are overweight or obese.  People who are African American.  If you are 38-23 years of age, have your blood pressure checked every 3-5 years. If you are 61 years of age or older, have your blood pressure checked every year. You should have your blood pressure measured twice--once when you are at a hospital or clinic, and once when you are not at a hospital or clinic. Record the average of the two measurements. To check your blood pressure when you are not at a hospital or clinic, you can use:  An automated blood pressure machine at a pharmacy.  A home blood pressure monitor.  If you are between 45 years and 39 years old, ask your health care provider if you should take aspirin to prevent strokes.  Have regular diabetes screenings. This involves taking a blood sample to check your fasting blood sugar level.  If you are at a normal weight and have a low risk for diabetes,  have this test once every three years after 43 years of age.  If you are overweight and have a high risk for diabetes, consider being tested at a younger age or more often. PREVENTING INFECTION  Hepatitis B  If you have a higher risk for hepatitis B, you should be screened for this virus. You are considered at high risk for hepatitis B if:  You were born in a country where hepatitis B is common. Ask your health care provider which countries are considered high risk.  Your parents were born in a high-risk country, and you have not been immunized against hepatitis B (hepatitis B vaccine).  You have HIV or AIDS.  You use needles to inject street drugs.  You live with someone who has hepatitis B.  You have had sex with someone who has hepatitis B.  You get hemodialysis treatment.  You take certain medicines for conditions, including cancer, organ transplantation, and autoimmune conditions. Hepatitis C  Blood testing is recommended for:  Everyone born from [birth year] through 1964.  Anyone with known risk factors for hepatitis C. Sexually transmitted infections (STIs)  You should be screened for sexually transmitted infections (STIs) including gonorrhea and chlamydia if:  You are sexually active and are younger than 43 years of age.  You are older than 43 years of age and your health care provider tells you that you are at risk for this type of infection.  Your sexual activity has changed since you were last screened and you are at an increased risk for chlamydia or gonorrhea. Ask your health care provider if you are at risk.  If you do not have HIV, but are at risk, it may be recommended that you take a prescription medicine daily to prevent HIV infection. This is called pre-exposure prophylaxis (PrEP). You are considered at risk if:  You are sexually active and do not regularly use condoms or know the HIV status of your partner(s).  You take drugs by injection.  You are sexually  active with a partner who has HIV. Talk with your health care provider about whether you are at high risk of being infected with HIV. If you choose to begin PrEP, you should first be tested for HIV. You should then be tested every 3 months for as long as you are taking PrEP.  PREGNANCY   If you are premenopausal and you may become pregnant, ask your health care provider about preconception counseling.  If you may  become pregnant, take 400 to 800 micrograms (mcg) of folic acid every day.  If you want to prevent pregnancy, talk to your health care provider about birth control (contraception). OSTEOPOROSIS AND MENOPAUSE   Osteoporosis is a disease in which the bones lose minerals and strength with aging. This can result in serious bone fractures. Your risk for osteoporosis can be identified using a bone density scan.  If you are 61 years of age or older, or if you are at risk for osteoporosis and fractures, ask your health care provider if you should be screened.  Ask your health care provider whether you should take a calcium or vitamin D supplement to lower your risk for osteoporosis.  Menopause may have certain physical symptoms and risks.  Hormone replacement therapy may reduce some of these symptoms and risks. Talk to your health care provider about whether hormone replacement therapy is right for you.  HOME CARE INSTRUCTIONS   Schedule regular health, dental, and eye exams.  Stay current with your immunizations.   Do not use any tobacco products including cigarettes, chewing tobacco, or electronic cigarettes.  If you are pregnant, do not drink alcohol.  If you are breastfeeding, limit how much and how often you drink alcohol.  Limit alcohol intake to no more than 1 drink per day for nonpregnant women. One drink equals 12 ounces of beer, 5 ounces of wine, or 1 ounces of hard liquor.  Do not use street drugs.  Do not share needles.  Ask your health care provider for help if  you need support or information about quitting drugs.  Tell your health care provider if you often feel depressed.  Tell your health care provider if you have ever been abused or do not feel safe at home.   This information is not intended to replace advice given to you by your health care provider. Make sure you discuss any questions you have with your health care provider.   Document Released: 09/22/2010 Document Revised: 03/30/2014 Document Reviewed: 02/08/2013 Elsevier Interactive Patient Education [employer].

## 2015-01-31 NOTE — Assessment & Plan Note (Signed)
General medical exam normal today except as noted. PAP pending. Mammogram ordered. Labs ordered. Encouraged healthy diet and exercise.Immunizations are UTD.

## 2015-02-01 LAB — CYTOLOGY - PAP

## 2015-02-04 LAB — CERVICOVAGINAL ANCILLARY ONLY
Candida vaginitis: NEGATIVE
Herpes: NEGATIVE

## 2015-02-05 ENCOUNTER — Ambulatory Visit: Payer: 59 | Attending: Internal Medicine

## 2015-02-05 ENCOUNTER — Other Ambulatory Visit: Payer: Self-pay | Admitting: *Deleted

## 2015-02-05 MED ORDER — METRONIDAZOLE 500 MG PO TABS: 500.0000 mg | ORAL_TABLET | Freq: Two times a day (BID) | ORAL | Status: DC

## 2015-02-11 ENCOUNTER — Other Ambulatory Visit: Payer: Self-pay

## 2015-02-11 DIAGNOSIS — E119 Type 2 diabetes mellitus without complications: Secondary | ICD-10-CM

## 2015-02-11 DIAGNOSIS — Z794 Long term (current) use of insulin: Principal | ICD-10-CM

## 2015-02-11 NOTE — Patient Outreach (Signed)
[facility]) Care Management  [place]  02/11/2015   Julia Brown July 01, 1970 [id number]  Subjective: Met with Julia Brown today.  She saw Dr. Gilford Rile last week and her A1C was 6.2%.  She is taking her meds as ordered.  She reports having episodes where she can tell her blood sugars are down but it is typically when she hasn't eaten breakfast; she eats and the blood sugar improves.  She does not have her meter today.  She is not exercising.   Current Medications:  Current Outpatient Prescriptions  Medication Sig Dispense Refill  . canagliflozin (INVOKANA) 300 MG TABS tablet Take 300 mg by mouth daily before breakfast. 30 tablet 3  . fluconazole (DIFLUCAN) 150 MG tablet Take 1 tablet (150 mg total) by mouth every 7 (seven) days. 6 tablet 0  . Insulin Glargine (LANTUS SOLOSTAR) 100 UNIT/ML Solostar Pen Inject 60 Units into the skin daily. (Patient taking differently: Inject 55 Units into the skin daily. ) 5 pen PRN  . Liraglutide 18 MG/3ML SOPN Inject 0.3 mLs (1.8 mg total) into the skin daily. 6 mL 11  . meloxicam (MOBIC) 15 MG tablet Take 15 mg by mouth daily.    . metFORMIN (GLUCOPHAGE) 500 MG tablet Take 500 mg in the morning and 1000 mg at night time (Patient taking differently: 500 mg daily with breakfast. ) 90 tablet 3  . metroNIDAZOLE (FLAGYL) 500 MG tablet Take 1 tablet (500 mg total) by mouth 2 (two) times daily. 14 tablet 0  . omeprazole (PRILOSEC) 40 MG capsule Take 1 capsule (40 mg total) by mouth daily. 30 capsule 3  . quinapril (ACCUPRIL) 20 MG tablet Take 1 tablet (20 mg total) by mouth at bedtime. 30 tablet 0  . VITAMIN D, ERGOCALCIFEROL, PO Take by mouth.    . benzonatate (TESSALON) 100 MG capsule Take 100 mg by mouth 3 (three) times daily. 1 or 2 po tid for cough  0  . carvedilol (COREG) 3.125 MG tablet Take 1 tablet (3.125 mg total) by mouth 2 (two) times daily with a meal. 180 tablet 1  . rosuvastatin (CRESTOR) 5 MG tablet Take 1 tablet (5 mg  total) by mouth daily. (Patient not taking: Reported on 02/11/2015) 90 tablet 3   No current facility-administered medications for this visit.    Functional Status:  In your present state of health, do you have any difficulty performing the following activities: 02/11/2015 11/12/2014  Hearing? N N  Vision? N N  Difficulty concentrating or making decisions? N N  Walking or climbing stairs? N N  Dressing or bathing? N N  Doing errands, shopping? N N    Fall/Depression Screening: PHQ 2/9 Scores 01/31/2015 11/12/2014  PHQ - 2 Score 0 0    Assessment: Well controlled diabetes.  She is attending MD visits and taking her medications as ordered.   Plan:  [facility] CM Care Plan Problem One        Most Recent Value   Care Plan Problem One  Potential for low blood sugars   Role Documenting the Problem One  Care Management Coordinator   Care Plan for Problem One  Active   THN Long Term Goal (31-90 days)  Patient will have no low blood sugars requiring glucose to treat it   THN Long Term Goal Start Date  02/11/15   Interventions for Problem One Long Term Goal  1. eat breakfast  2. carry glucose with you at all times  3. eat meals with carb  and protein  4. take meds as ordered    Patient needs to continue checking blood sugars a few times per week and bring her meter to all MD appointments. Follow up with me in 3 months- earlier if you have concerns.   Gentry Fitz, RN, BA, [place], [place] Direct Dial:  [phone]  Fax:  [phone] E-mail: [email or web address] [address] [place], [place]  [address]

## 2015-04-18 ENCOUNTER — Telehealth: Payer: Self-pay | Admitting: Physician Assistant

## 2015-04-18 MED ORDER — INSULIN GLARGINE 100 UNIT/ML SOLOSTAR PEN: 55.0000 [IU] | PEN_INJECTOR | Freq: Every day | SUBCUTANEOUS | Status: DC

## 2015-04-18 NOTE — Telephone Encounter (Signed)
Med refill approved, pt has seen pcp and had labs, last A1C was 6.5 Pt having more lows in the mornings so has decreased to 55u

## 2015-05-07 ENCOUNTER — Encounter: Payer: Self-pay | Admitting: Internal Medicine

## 2015-05-07 ENCOUNTER — Other Ambulatory Visit (HOSPITAL_COMMUNITY)
Admission: RE | Admit: 2015-05-07 | Discharge: 2015-05-07 | Disposition: A | Discharge status: Home / Self Care | Payer: 59 | Source: Ambulatory Visit | Attending: Internal Medicine | Admitting: Internal Medicine

## 2015-05-07 ENCOUNTER — Ambulatory Visit (INDEPENDENT_AMBULATORY_CARE_PROVIDER_SITE_OTHER): Payer: 59 | Admitting: Internal Medicine

## 2015-05-07 VITALS — BP 137/90 | HR 88 | Temp 97.8°F | Ht 70.0 in | Wt 248.5 lb

## 2015-05-07 DIAGNOSIS — N760 Acute vaginitis: Secondary | ICD-10-CM | POA: Insufficient documentation

## 2015-05-07 DIAGNOSIS — E119 Type 2 diabetes mellitus without complications: Secondary | ICD-10-CM | POA: Diagnosis not present

## 2015-05-07 DIAGNOSIS — A5901 Trichomonal vulvovaginitis: Secondary | ICD-10-CM

## 2015-05-07 DIAGNOSIS — E669 Obesity, unspecified: Secondary | ICD-10-CM

## 2015-05-07 DIAGNOSIS — E785 Hyperlipidemia, unspecified: Secondary | ICD-10-CM | POA: Diagnosis not present

## 2015-05-07 DIAGNOSIS — E559 Vitamin D deficiency, unspecified: Secondary | ICD-10-CM | POA: Insufficient documentation

## 2015-05-07 LAB — COMPREHENSIVE METABOLIC PANEL
ALT: 17 U/L (ref 0–35)
AST: 16 U/L (ref 0–37)
Albumin: 4.2 g/dL (ref 3.5–5.2)
Alkaline Phosphatase: 52 U/L (ref 39–117)
BILIRUBIN TOTAL: 0.5 mg/dL (ref 0.2–1.2)
BUN: 15 mg/dL (ref 6–23)
CALCIUM: 9.4 mg/dL (ref 8.4–10.5)
CHLORIDE: 107 meq/L (ref 96–112)
CO2: 27 meq/L (ref 19–32)
CREATININE: 0.74 mg/dL (ref 0.40–1.20)
GFR: 109.68 mL/min (ref >60.00)
GLUCOSE: 125 mg/dL — AB (ref 70–99)
Potassium: 4.5 mEq/L (ref 3.5–5.1)
Sodium: 140 mEq/L (ref 135–145)
Total Protein: 6.8 g/dL (ref 6.0–8.3)

## 2015-05-07 LAB — MICROALBUMIN / CREATININE URINE RATIO
CREATININE, U: 73.4 mg/dL
Microalb Creat Ratio: 1 mg/g (ref 0.0–30.0)
Microalb, Ur: <0.7 mg/dL (ref 0.0–1.9)

## 2015-05-07 LAB — HEMOGLOBIN A1C: Hgb A1c MFr Bld: 6.5 % (ref 4.6–6.5)

## 2015-05-07 LAB — LIPID PANEL
CHOL/HDL RATIO: 4
CHOLESTEROL: 221 mg/dL — AB (ref 0–200)
HDL: 54.5 mg/dL (ref >39.00)
LDL CALC: 133 mg/dL — AB (ref 0–99)
NonHDL: 166.84
TRIGLYCERIDES: 168 mg/dL — AB (ref 0.0–149.0)
VLDL: 33.6 mg/dL (ref 0.0–40.0)

## 2015-05-07 LAB — VITAMIN D 25 HYDROXY (VIT D DEFICIENCY, FRACTURES): VITD: 29.08 ng/mL — ABNORMAL LOW (ref 30.00–100.00)

## 2015-05-07 MED ORDER — INSULIN GLARGINE 100 UNIT/ML SOLOSTAR PEN: 50.0000 [IU] | PEN_INJECTOR | Freq: Every day | SUBCUTANEOUS | Status: DC

## 2015-05-07 NOTE — Progress Notes (Signed)
Pre visit review using our clinic review tool, if applicable. No additional management support is needed unless otherwise documented below in the visit note. 

## 2015-05-07 NOTE — Assessment & Plan Note (Signed)
S/p Treatment with Metronidazole. Will recheck urine cytology to confirm cure.

## 2015-05-07 NOTE — Assessment & Plan Note (Signed)
Will check lipids and LFTs with labs. 

## 2015-05-07 NOTE — Assessment & Plan Note (Signed)
Wt Readings from Last 3 Encounters:  05/07/15 248 lb 8 oz (112.719 kg)  01/31/15 255 lb 8 oz (115.894 kg)  11/12/14 255 lb (115.667 kg)   Encouraged healthy diet and exercise.

## 2015-05-07 NOTE — Patient Instructions (Signed)
Labs today.  Follow up 3 months. 

## 2015-05-07 NOTE — Assessment & Plan Note (Signed)
Will check A1c with labs. Continue Lantus and Invokana and Metformin.

## 2015-05-07 NOTE — Progress Notes (Signed)
Subjective:    Patient ID: Julia Brown, female    DOB: 1970/08/24, 45 y.o.   MRN: [id number]  HPI  45YO female presents for follow up.  DM - Decreased Lantus to 50 because having some low BG. Last A1c 6.2%. Was having some low BG with diaphoresis in mid day, as low as 60s. Improved with dropping to 50units. Also taking Invokana. Notes increased frequency of urination with this medication.  Recent PAP was pos for Trich. Treated with Metronidazole. Denies any vaginal discharge or other symptoms.   Wt Readings from Last 3 Encounters:  05/07/15 248 lb 8 oz (112.719 kg)  01/31/15 255 lb 8 oz (115.894 kg)  11/12/14 255 lb (115.667 kg)   BP Readings from Last 3 Encounters:  05/07/15 137/90  01/31/15 154/98  12/18/14 110/88    Past Medical History  Diagnosis Date  . Diabetes mellitus   . Hyperlipidemia   . Hypertension   . Chicken pox   . GERD (gastroesophageal reflux disease)   . UTI (lower urinary tract infection)    Family History  Problem Relation Age of Onset  . Heart disease Father   . Hyperlipidemia Father   . Diabetes Father   . Hyperlipidemia Mother   . Hypertension Mother   . Schizophrenia Mother   . Depression Mother   . Diabetes Mother   . Cancer Maternal Aunt     breast  . Diabetes Maternal Grandmother   . Diabetes Maternal Grandfather   . Heart disease Other    Past Surgical History  Procedure Laterality Date  . Tubal ligation    . Uterine ablation    . Cholecystectomy  1993  . Cesarean section  1994  . Ablation  2007    excessive bleeding  . Orif femur fracture Right     [place]   Social History   Social History  . Marital Status: Married    Spouse Name: N/A  . Number of Children: N/A  . Years of Education: N/A   Social History Main Topics  . Smoking status: Current Every Day Smoker -- 0.50 packs/day for 10 years    Types: Cigarettes  . Smokeless tobacco: Never Used     Comment: smoked < 1 pack a day  . Alcohol Use: 0.0 oz/week    0 Standard drinks or equivalent per week     Comment: weekend 3-4drinks  . Drug Use: No  . Sexual Activity: Yes    Birth Control/ Protection: None   Other Topics Concern  . None   Social History Narrative   Lives in [place].      Work - [employer]      Diet - regular    Review of Systems  Constitutional: Negative for fever, chills, appetite change, fatigue and unexpected weight change.  Eyes: Negative for visual disturbance.  Respiratory: Negative for shortness of breath.   Cardiovascular: Negative for chest pain, palpitations and leg swelling.  Gastrointestinal: Negative for nausea, vomiting, abdominal pain, diarrhea and constipation.  Genitourinary: Negative for dysuria, frequency, vaginal discharge and pelvic pain.  Musculoskeletal: Negative for myalgias and arthralgias.  Skin: Negative for color change and rash.  Hematological: Negative for adenopathy. Does not bruise/bleed easily.  Psychiatric/Behavioral: Negative for sleep disturbance and dysphoric mood. The patient is not nervous/anxious.        Objective:    BP 137/90 mmHg  Pulse 88  Temp(Src) 97.8 F (36.6 C) (Oral)  Ht  (1.778 m)  Wt 248  lb 8 oz (112.719 kg)  BMI 35.66 kg/m2  SpO2 99% Physical Exam  Constitutional: She is oriented to person, place, and time. She appears well-developed and well-nourished. No distress.  HENT:  Head: Normocephalic and atraumatic.  Right Ear: External ear normal.  Left Ear: External ear normal.  Nose: Nose normal.  Mouth/Throat: Oropharynx is clear and moist. No oropharyngeal exudate.  Eyes: Conjunctivae are normal. Pupils are equal, round, and reactive to light. Right eye exhibits no discharge. Left eye exhibits no discharge. No scleral icterus.  Neck: Normal range of motion. Neck supple. No tracheal deviation present. No thyromegaly present.  Cardiovascular: Normal rate, regular rhythm, normal heart sounds and intact distal pulses.  Exam reveals no gallop  and no friction rub.   No murmur heard. Pulmonary/Chest: Effort normal and breath sounds normal. No respiratory distress. She has no wheezes. She has no rales. She exhibits no tenderness.  Musculoskeletal: Normal range of motion. She exhibits no edema or tenderness.  Lymphadenopathy:    She has no cervical adenopathy.  Neurological: She is alert and oriented to person, place, and time. No cranial nerve deficit. She exhibits normal muscle tone. Coordination normal.  Skin: Skin is warm and dry. No rash noted. She is not diaphoretic. No erythema. No pallor.  Psychiatric: She has a normal mood and affect. Her behavior is normal. Judgment and thought content normal.          Assessment & Plan:   Problem List Items Addressed This Visit      Unprioritized   Diabetes type 2, controlled (HCC) - Primary    Will check A1c with labs. Continue Lantus and Invokana and Metformin.      Relevant Medications   Insulin Glargine (LANTUS SOLOSTAR) 100 UNIT/ML Solostar Pen   Other Relevant Orders   Comprehensive metabolic panel   Hemoglobin A1c   Lipid panel   Microalbumin / creatinine urine ratio   Hyperlipidemia    Will check lipids and LFTs with labs.      Obesity (BMI 30-39.9)    Wt Readings from Last 3 Encounters:  05/07/15 248 lb 8 oz (112.719 kg)  01/31/15 255 lb 8 oz (115.894 kg)  11/12/14 255 lb (115.667 kg)   Encouraged healthy diet and exercise.      Relevant Medications   Insulin Glargine (LANTUS SOLOSTAR) 100 UNIT/ML Solostar Pen   Trichomonal vaginitis    S/p Treatment with Metronidazole. Will recheck urine cytology to confirm cure.      Relevant Orders   Urine cytology ancillary only   Vitamin D deficiency    Repeat Vit D level today.      Relevant Orders   Vitamin D (25 hydroxy)       Return in about 3 months (around 08/04/2015) for Recheck of Diabetes.

## 2015-05-07 NOTE — Assessment & Plan Note (Signed)
Repeat Vit D level today. 

## 2015-05-08 LAB — URINE CYTOLOGY ANCILLARY ONLY: TRICH (WINDOWPATH): NEGATIVE

## 2015-05-13 ENCOUNTER — Other Ambulatory Visit: Payer: Self-pay | Admitting: Physician Assistant

## 2015-05-13 ENCOUNTER — Other Ambulatory Visit: Payer: Self-pay

## 2015-05-13 NOTE — Patient Outreach (Signed)
[facility]) Care Management  05/13/2015  BRYA SIMERLY 12/09/1970 [id number]   Patient comes to report recent findings at her MD visit- A1C is 6.5% and she has lost 7lbs since she last visited.  She has had to decrease her Lantus to 50 units/day.  She continues to check her blood sugars a few times a week.   Susette Racer, RN, BA, MHA, CDE [facility] Diabetes Coordinator- Link To [employer] Dial:  [phone]  Fax:  [phone] E-mail: [name] [address] [place], [place]  [address]

## 2015-05-16 DIAGNOSIS — H52223 Regular astigmatism, bilateral: Secondary | ICD-10-CM | POA: Diagnosis not present

## 2015-05-16 DIAGNOSIS — H5213 Myopia, bilateral: Secondary | ICD-10-CM | POA: Diagnosis not present

## 2015-05-16 LAB — HM DIABETES EYE EXAM

## 2015-05-22 ENCOUNTER — Encounter: Payer: Self-pay | Admitting: Internal Medicine

## 2015-07-16 ENCOUNTER — Other Ambulatory Visit: Payer: Self-pay | Admitting: Physician Assistant

## 2015-07-16 NOTE — Telephone Encounter (Signed)
Med refill approved, pt sees her pcp regularly

## 2015-07-17 ENCOUNTER — Encounter: Payer: Self-pay | Admitting: Internal Medicine

## 2015-07-19 ENCOUNTER — Encounter: Payer: Self-pay | Admitting: Internal Medicine

## 2015-07-19 ENCOUNTER — Other Ambulatory Visit (HOSPITAL_COMMUNITY)
Admission: RE | Admit: 2015-07-19 | Discharge: 2015-07-19 | Disposition: A | Discharge status: Home / Self Care | Payer: 59 | Source: Ambulatory Visit | Attending: Internal Medicine | Admitting: Internal Medicine

## 2015-07-19 ENCOUNTER — Ambulatory Visit (INDEPENDENT_AMBULATORY_CARE_PROVIDER_SITE_OTHER): Payer: 59 | Admitting: Internal Medicine

## 2015-07-19 VITALS — BP 132/96 | HR 97 | Temp 98.0°F | Wt 249.4 lb

## 2015-07-19 DIAGNOSIS — N760 Acute vaginitis: Secondary | ICD-10-CM | POA: Insufficient documentation

## 2015-07-19 DIAGNOSIS — E119 Type 2 diabetes mellitus without complications: Secondary | ICD-10-CM

## 2015-07-19 DIAGNOSIS — Z01419 Encounter for gynecological examination (general) (routine) without abnormal findings: Secondary | ICD-10-CM | POA: Insufficient documentation

## 2015-07-19 DIAGNOSIS — Z113 Encounter for screening for infections with a predominantly sexual mode of transmission: Secondary | ICD-10-CM | POA: Insufficient documentation

## 2015-07-19 LAB — COMPREHENSIVE METABOLIC PANEL
ALBUMIN: 4.1 g/dL (ref 3.5–5.2)
ALT: 19 U/L (ref 0–35)
AST: 15 U/L (ref 0–37)
Alkaline Phosphatase: 45 U/L (ref 39–117)
BUN: 15 mg/dL (ref 6–23)
CALCIUM: 9.4 mg/dL (ref 8.4–10.5)
CHLORIDE: 103 meq/L (ref 96–112)
CO2: 28 meq/L (ref 19–32)
Creatinine, Ser: 0.76 mg/dL (ref 0.40–1.20)
GFR: 106.25 mL/min (ref >60.00)
Glucose, Bld: 129 mg/dL — ABNORMAL HIGH (ref 70–99)
POTASSIUM: 4.1 meq/L (ref 3.5–5.1)
Sodium: 137 mEq/L (ref 135–145)
TOTAL PROTEIN: 7 g/dL (ref 6.0–8.3)
Total Bilirubin: 0.4 mg/dL (ref 0.2–1.2)

## 2015-07-19 LAB — HEMOGLOBIN A1C: HEMOGLOBIN A1C: 6.4 % (ref 4.6–6.5)

## 2015-07-19 MED ORDER — METRONIDAZOLE 500 MG PO TABS: 500.0000 mg | ORAL_TABLET | Freq: Two times a day (BID) | ORAL | Status: DC

## 2015-07-19 MED ORDER — FLUCONAZOLE 150 MG PO TABS: 150.0000 mg | ORAL_TABLET | Freq: Once | ORAL | Status: DC

## 2015-07-19 NOTE — Progress Notes (Signed)
Subjective:    Patient ID: Julia Brown, female    DOB: February 16, 1971, 45 y.o.   MRN: [id number]  HPI  45YO female presents for acute visit.  Had one episode of sexual intercourse in which condom came off. No vaginal discharge. Some spotting after intercourse. No pelvic pain or fever. Some brownish discharge on occasion only after intercourse.   Wt Readings from Last 3 Encounters:  07/19/15 249 lb 6.4 oz (113.127 kg)  05/07/15 248 lb 8 oz (112.719 kg)  01/31/15 255 lb 8 oz (115.894 kg)   BP Readings from Last 3 Encounters:  07/19/15 132/96  05/07/15 137/90  01/31/15 154/98    Past Medical History  Diagnosis Date  . Diabetes mellitus   . Hyperlipidemia   . Hypertension   . Chicken pox   . GERD (gastroesophageal reflux disease)   . UTI (lower urinary tract infection)    Family History  Problem Relation Age of Onset  . Heart disease Father   . Hyperlipidemia Father   . Diabetes Father   . Hyperlipidemia Mother   . Hypertension Mother   . Schizophrenia Mother   . Depression Mother   . Diabetes Mother   . Cancer Maternal Aunt     breast  . Diabetes Maternal Grandmother   . Diabetes Maternal Grandfather   . Heart disease Other    Past Surgical History  Procedure Laterality Date  . Tubal ligation    . Uterine ablation    . Cholecystectomy  1993  . Cesarean section  1994  . Ablation  2007    excessive bleeding  . Orif femur fracture Right     [place]   Social History   Social History  . Marital Status: Married    Spouse Name: N/A  . Number of Children: N/A  . Years of Education: N/A   Social History Main Topics  . Smoking status: Current Every Day Smoker -- 0.50 packs/day for 10 years    Types: Cigarettes  . Smokeless tobacco: Never Used     Comment: smoked < 1 pack a day  . Alcohol Use: 0.0 oz/week    0 Standard drinks or equivalent per week     Comment: weekend 3-4drinks  . Drug Use: No  . Sexual Activity: Yes    Birth Control/ Protection:  None   Other Topics Concern  . None   Social History Narrative   Lives in [place].      Work - [employer]	[place]      Diet - regular    Review of Systems  Constitutional: Negative for fever, chills, appetite change, fatigue and unexpected weight change.  Eyes: Negative for visual disturbance.  Respiratory: Negative for shortness of breath.   Cardiovascular: Negative for chest pain and leg swelling.  Gastrointestinal: Negative for nausea, vomiting, abdominal pain, diarrhea and constipation.  Genitourinary: Positive for vaginal bleeding and vaginal discharge. Negative for urgency, frequency, flank pain, genital sores, vaginal pain, menstrual problem, pelvic pain and dyspareunia.  Skin: Negative for color change and rash.  Hematological: Negative for adenopathy. Does not bruise/bleed easily.  Psychiatric/Behavioral: Negative for dysphoric mood. The patient is not nervous/anxious.        Objective:    BP 132/96 mmHg  Pulse 97  Temp(Src) 98 F (36.7 C) (Oral)  Wt 249 lb 6.4 oz (113.127 kg)  SpO2 97% Physical Exam  Constitutional: She is oriented to person, place, and time. She appears well-developed and well-nourished. No distress.  HENT:  Head: Normocephalic and atraumatic.  Right Ear: External ear normal.  Left Ear: External ear normal.  Nose: Nose normal.  Mouth/Throat: Oropharynx is clear and moist.  Eyes: Conjunctivae are normal. Pupils are equal, round, and reactive to light. Right eye exhibits no discharge. Left eye exhibits no discharge. No scleral icterus.  Neck: Normal range of motion. Neck supple. No tracheal deviation present. No thyromegaly present.  Pulmonary/Chest: Effort normal. No accessory muscle usage. No tachypnea. She has no decreased breath sounds. She has no rhonchi.  Genitourinary: There is no rash, tenderness, lesion or injury on the right labia. There is no rash, tenderness, lesion or injury on the left labia. No erythema, tenderness or bleeding  in the vagina. No foreign body around the vagina. Vaginal discharge (white, thick) found.  Musculoskeletal: Normal range of motion. She exhibits no edema or tenderness.  Lymphadenopathy:    She has no cervical adenopathy.  Neurological: She is alert and oriented to person, place, and time. No cranial nerve deficit. She exhibits normal muscle tone. Coordination normal.  Skin: Skin is warm and dry. No rash noted. She is not diaphoretic. No erythema. No pallor.  Psychiatric: She has a normal mood and affect. Her behavior is normal. Judgment and thought content normal.          Assessment & Plan:   Problem List Items Addressed This Visit      Unprioritized   Diabetes type 2, controlled ([place])   Relevant Orders   Comp Met (CMET)   HgB A1c   Vaginitis and vulvovaginitis - Primary    Exam is most consistent with candidiasis. Will send culture and testing for GC/CHL, Trichomonas, BV. Will also send blood work including HIV, RPR and Hep B/C. Start Metronidazole and Diflucan. Follow up prn.      Relevant Medications   metroNIDAZOLE (FLAGYL) 500 MG tablet   fluconazole (DIFLUCAN) 150 MG tablet   Other Relevant Orders   Cytology - PAP   HIV antibody   Hepatitis C Antibody   Hepatitis B Surface AntiGEN   RPR       Return if symptoms worsen or fail to improve.  Ronette Deter, MD [facility]

## 2015-07-19 NOTE — Progress Notes (Signed)
Pre visit review using our clinic review tool, if applicable. No additional management support is needed unless otherwise documented below in the visit note. 

## 2015-07-19 NOTE — Patient Instructions (Signed)
Please start Metronidazole 500mg  twice daily.  Take Diflucan 150mg  today.  We will send cultures and labs and call or email with results.

## 2015-07-19 NOTE — Assessment & Plan Note (Signed)
Exam is most consistent with candidiasis. Will send culture and testing for GC/CHL, Trichomonas, BV. Will also send blood work including HIV, RPR and Hep B/C. Start Metronidazole and Diflucan. Follow up prn.

## 2015-07-20 LAB — HEPATITIS B SURFACE ANTIGEN: Hepatitis B Surface Ag: NEGATIVE

## 2015-07-20 LAB — RPR

## 2015-07-20 LAB — HIV ANTIBODY (ROUTINE TESTING W REFLEX): HIV 1&2 Ab, 4th Generation: NONREACTIVE

## 2015-07-20 LAB — HEPATITIS C ANTIBODY: HCV AB: NEGATIVE

## 2015-07-22 ENCOUNTER — Other Ambulatory Visit: Payer: Self-pay

## 2015-07-22 VITALS — BP 110/80 | Ht 70.0 in | Wt 246.5 lb

## 2015-07-22 DIAGNOSIS — Z794 Long term (current) use of insulin: Principal | ICD-10-CM

## 2015-07-22 DIAGNOSIS — E119 Type 2 diabetes mellitus without complications: Secondary | ICD-10-CM

## 2015-07-22 LAB — CYTOLOGY - PAP

## 2015-07-22 NOTE — Patient Outreach (Signed)
[facility]) Care Management  [facility]  07/22/2015   Charlotte CrumbMonique T Urbieta 11/11/1970 [id number]  Subjective: Patient in for her regularly scheduled visit- she has no meter and has not been checking blood sugars but she was at the MD on Friday, April 29th and reports her A1C is 6.4%.  She tells me Dr. Dan HumphreysWalker decreased her Lantus to 50 units/day because she was having some hypogylcemia- this typically happens in the morning because she stops to pick up breakfast on her way to work and then doesn't eat until she gets to work. She does carry peppermints with her at all times.   Objective:  Filed Vitals:   07/22/15 1314  BP: 110/80  Height: 1.778 m (5\' 10" )  Weight: 246 lb 8 oz (111.812 kg)     Encounter Medications:  Outpatient Encounter Prescriptions as of 07/22/2015  Medication Sig  . canagliflozin (INVOKANA) 300 MG TABS tablet Take 300 mg by mouth daily before breakfast.  . carvedilol (COREG) 3.125 MG tablet TAKE 1 TABLET BY MOUTH TWICE DAILY WITH A MEAL  . fluconazole (DIFLUCAN) 150 MG tablet Take 1 tablet (150 mg total) by mouth once.  . Insulin Glargine (LANTUS SOLOSTAR) 100 UNIT/ML Solostar Pen Inject 50 Units into the skin daily.  . Liraglutide 18 MG/3ML SOPN Inject 0.3 mLs (1.8 mg total) into the skin daily.  . meloxicam (MOBIC) 15 MG tablet Take 15 mg by mouth daily.  . metFORMIN (GLUCOPHAGE) 500 MG tablet Take 500 mg in the morning and 1000 mg at night time (Patient taking differently: 500 mg daily with breakfast. )  . metroNIDAZOLE (FLAGYL) 500 MG tablet Take 1 tablet (500 mg total) by mouth 2 (two) times daily.  Marland Kitchen. omeprazole (PRILOSEC) 40 MG capsule TAKE ONE CAPSULE BY MOUTH DAILY  . quinapril (ACCUPRIL) 20 MG tablet Take 1 tablet (20 mg total) by mouth at bedtime.  Marland Kitchen. UNIFINE PENTIPS 31G X 5 MM MISC USE AS DIRECTED  . VITAMIN D, ERGOCALCIFEROL, PO Take 50,000 Int'l Units by mouth.    No facility-administered encounter medications on file as of 07/22/2015.     Functional Status:  In your present state of health, do you have any difficulty performing the following activities: 02/11/2015 11/12/2014  Hearing? N N  Vision? N N  Difficulty concentrating or making decisions? N N  Walking or climbing stairs? N N  Dressing or bathing? N N  Doing errands, shopping? N N    Fall/Depression Screening: PHQ 2/9 Scores 07/22/2015 01/31/2015 11/12/2014  PHQ - 2 Score 0 0 0    Assessment: Reviewed the need for preventative dental visit and for checking her blood sugars at least 3x/week. She would like to lose some weight but we discussed the need to add exercise if she is going to achieve this- this will be challenging because she works a full time job plus 10 additional hours each week.    [facility]  [facility]        Most Recent Value   Care Plan Problem One  Potential for low blood suagrs   Role Documenting the Problem One  Care Management Coordinator   Care Plan for Problem One  Active   THN Long Term Goal (31-90 days)  Patient will have no low blood sugars   THN Long Term Goal Start Date  07/22/15   Interventions for Problem One Long Term Goal  1. eat breakfast daily- avoid delays between meds and eating  3.  carrry  glucose with you at all times  3. Decrease Lantus to 50 units per day as instructed by MD     Gabriel Rung has a coworker who has diabetes and he appears to be "challenging" Kaely to keep her A1C ideal and she reports he is constantly showing her his blood sugars and stated " i need to be checking more".  She is overdue on a dental cleaning.  I will follow up with her in late August after her MD visit or before then if she continues to have low blood sugars.    Susette Racer, RN, BA, MHA, CDE [facility] Diabetes Coordinator- Link To [employer] Dial:  [phone]  Fax:  [phone] E-mail: [name] [address] [place], [place]  [address]

## 2015-07-24 LAB — CERVICOVAGINAL ANCILLARY ONLY: Candida vaginitis: NEGATIVE

## 2015-07-29 ENCOUNTER — Other Ambulatory Visit: Payer: Self-pay | Admitting: Physician Assistant

## 2015-07-29 NOTE — Telephone Encounter (Signed)
Med refill approved, pt has been seeing her pcp as required, A1C is 6.3 with invokana, med being managed by her pcp

## 2015-08-06 ENCOUNTER — Ambulatory Visit: Payer: Self-pay | Admitting: Internal Medicine

## 2015-08-06 ENCOUNTER — Telehealth: Payer: Self-pay | Admitting: Internal Medicine

## 2015-08-06 DIAGNOSIS — Z0289 Encounter for other administrative examinations: Secondary | ICD-10-CM

## 2015-08-06 NOTE — Telephone Encounter (Signed)
Pt called in about missing her appt she just realized she had an appt this morning. Pt states her phone reset itself to it's originial factory setting. Pt did resch her appt. Pt wanted Dr Dan HumphreysWalker to know she apologizes. Thank you!

## 2015-08-06 NOTE — Telephone Encounter (Signed)
Thanks

## 2015-08-08 ENCOUNTER — Other Ambulatory Visit: Payer: Self-pay | Admitting: Internal Medicine

## 2015-09-04 ENCOUNTER — Ambulatory Visit (INDEPENDENT_AMBULATORY_CARE_PROVIDER_SITE_OTHER): Payer: 59 | Admitting: Internal Medicine

## 2015-09-04 ENCOUNTER — Encounter: Payer: Self-pay | Admitting: Internal Medicine

## 2015-09-04 VITALS — BP 134/88 | HR 85 | Ht 70.0 in | Wt 250.2 lb

## 2015-09-04 DIAGNOSIS — N760 Acute vaginitis: Secondary | ICD-10-CM

## 2015-09-04 DIAGNOSIS — I10 Essential (primary) hypertension: Secondary | ICD-10-CM | POA: Diagnosis not present

## 2015-09-04 DIAGNOSIS — E119 Type 2 diabetes mellitus without complications: Secondary | ICD-10-CM | POA: Diagnosis not present

## 2015-09-04 LAB — POCT URINALYSIS DIPSTICK
Bilirubin, UA: NEGATIVE
Ketones, UA: NEGATIVE
Leukocytes, UA: NEGATIVE
NITRITE UA: NEGATIVE
PROTEIN UA: NEGATIVE
RBC UA: NEGATIVE
SPEC GRAV UA: 1.025
UROBILINOGEN UA: 0.2
pH, UA: 5.5

## 2015-09-04 MED ORDER — INSULIN GLARGINE 100 UNIT/ML SOLOSTAR PEN: 40.0000 [IU] | PEN_INJECTOR | Freq: Every day | SUBCUTANEOUS | Status: DC

## 2015-09-04 NOTE — Patient Instructions (Signed)
Taper Lantus to 40units daily, then if blood sugars are relatively stable, decrease to 30units daily.  Email in 1-2 weeks with readings.

## 2015-09-04 NOTE — Assessment & Plan Note (Signed)
BP Readings from Last 3 Encounters:  09/04/15 134/88  07/22/15 110/80  07/19/15 132/96   BP well controlled. Recent renal function normal. Will continue Quinapril and Carvedilol.

## 2015-09-04 NOTE — Progress Notes (Signed)
Subjective:    Patient ID: Julia Brown, female    DOB: [birth year], 45 y.o.   MRN: [id number]  HPI  45YO female presents for follow up.  DM - BG running near 115. Highest 140. Compliant with medication.Few lows below 80 near lunchtime.  Lab Results  Component Value Date   HGBA1C 6.4 07/19/2015   Recently seen for vaginitis. BV and yeast were both positive. Treated with Diflucan, however never filled the Flagyl. No current vaginal discharge, pelvic pain.  Notes some mild burning with urination. 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 Reports it was pos for blood and protein.  Wt Readings from Last 3 Encounters:  09/04/15 250 lb 3.2 oz (113.49 kg)  07/22/15 246 lb 8 oz (111.812 kg)  07/19/15 249 lb 6.4 oz (113.127 kg)   BP Readings from Last 3 Encounters:  09/04/15 134/88  07/22/15 110/80  07/19/15 132/96    Past Medical History  Diagnosis Date  . Diabetes mellitus   . Hyperlipidemia   . Hypertension   . Chicken pox   . GERD (gastroesophageal reflux disease)   . UTI (lower urinary tract infection)    Family History  Problem Relation Age of Onset  . Heart disease Father   . Hyperlipidemia Father   . Diabetes Father   . Hyperlipidemia Mother   . Hypertension Mother   . Schizophrenia Mother   . Depression Mother   . Diabetes Mother   . Cancer Maternal Aunt     breast  . Diabetes Maternal Grandmother   . Diabetes Maternal Grandfather   . Heart disease Other    Past Surgical History  Procedure Laterality Date  . Tubal ligation    . Uterine ablation    . Cholecystectomy  1993  . Cesarean section  1994  . Ablation  2007    excessive bleeding  . Orif femur fracture Right     [place]   Social History   Social History  . Marital Status: Married    Spouse Name: N/A  . Number of Children: N/A  . Years of Education: N/A   Social History Main Topics  . Smoking status: Current Every Day Smoker -- 0.50 packs/day for 10 years    Types: Cigarettes  .  Smokeless tobacco: Never Used     Comment: smoked < 1 pack a day  . Alcohol Use: 0.0 oz/week    0 Standard drinks or equivalent per week     Comment: weekend 3-4drinks  . Drug Use: No  . Sexual Activity: Yes    Birth Control/ Protection: None   Other Topics Concern  . None   Social History Narrative   Lives in [place].      Work - [employer]      Diet - regular    Review of Systems  Constitutional: Negative for fever, chills, appetite change, fatigue and unexpected weight change.  Eyes: Negative for visual disturbance.  Respiratory: Negative for cough and shortness of breath.   Cardiovascular: Negative for chest pain and leg swelling.  Gastrointestinal: Negative for nausea, vomiting, abdominal pain, diarrhea, constipation and rectal pain.  Genitourinary: Positive for dysuria. Negative for urgency, frequency, hematuria, flank pain, decreased urine volume, vaginal bleeding, vaginal discharge, difficulty urinating, vaginal pain and pelvic pain.  Musculoskeletal: Negative for myalgias and arthralgias.  Skin: Negative for color change and rash.  Hematological: Negative for adenopathy. Does not bruise/bleed easily.  Psychiatric/Behavioral: Negative for dysphoric mood. The patient is not nervous/anxious.  Objective:    BP 134/88 mmHg  Pulse 85  Ht 5\' 10"  (1.778 m)  Wt 250 lb 3.2 oz (113.49 kg)  BMI 35.90 kg/m2  SpO2 98% Physical Exam  Constitutional: She is oriented to person, place, and time. She appears well-developed and well-nourished. No distress.  HENT:  Head: Normocephalic and atraumatic.  Right Ear: External ear normal.  Left Ear: External ear normal.  Nose: Nose normal.  Mouth/Throat: Oropharynx is clear and moist. No oropharyngeal exudate.  Eyes: Conjunctivae are normal. Pupils are equal, round, and reactive to light. Right eye exhibits no discharge. Left eye exhibits no discharge. No scleral icterus.  Neck: Normal range of motion. Neck supple.  No tracheal deviation present. No thyromegaly present.  Cardiovascular: Normal rate, regular rhythm, normal heart sounds and intact distal pulses.  Exam reveals no gallop and no friction rub.   No murmur heard. Pulmonary/Chest: Effort normal and breath sounds normal. No respiratory distress. She has no wheezes. She has no rales. She exhibits no tenderness.  Musculoskeletal: Normal range of motion. She exhibits no edema or tenderness.  Lymphadenopathy:    She has no cervical adenopathy.  Neurological: She is alert and oriented to person, place, and time. No cranial nerve deficit. She exhibits normal muscle tone. Coordination normal.  Skin: Skin is warm and dry. No rash noted. She is not diaphoretic. No erythema. No pallor.  Psychiatric: She has a normal mood and affect. Her behavior is normal. Judgment and thought content normal.          Assessment & Plan:   Problem List Items Addressed This Visit      Unprioritized   Diabetes type 2, controlled (HCC) - Primary    BG running lower on current regimen. Will taper Lantus to 40units then to 30units daily in 1 week. She will email with BG. Follow up in 4 weeks.      Relevant Medications   Insulin Glargine (LANTUS SOLOSTAR) 100 UNIT/ML Solostar Pen   Essential hypertension    BP Readings from Last 3 Encounters:  09/04/15 134/88  07/22/15 110/80  07/19/15 132/96   BP well controlled. Recent renal function normal. Will continue Quinapril and Carvedilol.      Vaginitis and vulvovaginitis    Reviewed culture from recent vaginal culture. Encouraged her to take course of Flagyl. Given mild recent dysuria, will also repeat UA and culture today. Follow up prn.      Relevant Orders   POCT Urinalysis Dipstick (Completed)   CULTURE, URINE COMPREHENSIVE       Return in about 4 weeks (around 10/02/2015) for Recheck of Diabetes.  Ronna PolioJennifer Walker, MD Internal Medicine [facility]

## 2015-09-04 NOTE — Assessment & Plan Note (Signed)
BG running lower on current regimen. Will taper Lantus to 40units then to 30units daily in 1 week. She will email with BG. Follow up in 4 weeks.

## 2015-09-04 NOTE — Assessment & Plan Note (Addendum)
Reviewed culture from recent vaginal culture. Encouraged her to take course of Flagyl. Given mild recent dysuria, will also repeat UA and culture today. Follow up prn.

## 2015-09-04 NOTE — Progress Notes (Signed)
Pre visit review using our clinic review tool, if applicable. No additional management support is needed unless otherwise documented below in the visit note. 

## 2015-09-07 LAB — CULTURE, URINE COMPREHENSIVE: Colony Count: >=100000

## 2015-10-14 ENCOUNTER — Ambulatory Visit (INDEPENDENT_AMBULATORY_CARE_PROVIDER_SITE_OTHER): Payer: 59 | Admitting: Internal Medicine

## 2015-10-14 ENCOUNTER — Encounter: Payer: Self-pay | Admitting: Internal Medicine

## 2015-10-14 VITALS — BP 126/76 | HR 92 | Wt 244.4 lb

## 2015-10-14 DIAGNOSIS — E669 Obesity, unspecified: Secondary | ICD-10-CM

## 2015-10-14 DIAGNOSIS — E119 Type 2 diabetes mellitus without complications: Secondary | ICD-10-CM | POA: Diagnosis not present

## 2015-10-14 DIAGNOSIS — E785 Hyperlipidemia, unspecified: Secondary | ICD-10-CM

## 2015-10-14 DIAGNOSIS — I10 Essential (primary) hypertension: Secondary | ICD-10-CM

## 2015-10-14 MED ORDER — LIRAGLUTIDE 18 MG/3ML ~~LOC~~ SOPN: PEN_INJECTOR | SUBCUTANEOUS | 11 refills | Status: DC

## 2015-10-14 MED ORDER — INSULIN GLARGINE 100 UNIT/ML SOLOSTAR PEN: 40.0000 [IU] | PEN_INJECTOR | Freq: Every day | SUBCUTANEOUS | 12 refills | Status: DC

## 2015-10-14 NOTE — Assessment & Plan Note (Addendum)
BG well controlled by report. Will continue Victoza and Lantus.. A1c with labs.

## 2015-10-14 NOTE — Assessment & Plan Note (Signed)
Wt Readings from Last 3 Encounters:  10/14/15 244 lb 6.4 oz (110.9 kg)  09/04/15 250 lb 3.2 oz (113.5 kg)  07/19/15 249 lb 6.4 oz (113.1 kg)   Body mass index is 35.07 kg/m.  Congratulated pt on weight loss. Encouraged continued efforts at healthy diet and exercise.

## 2015-10-14 NOTE — Progress Notes (Signed)
Subjective:    Patient ID: Julia Brown, female    DOB: [birth year], 45 y.o.   MRN: [id number]  HPI  45YO female presents for follow up.  DM - BG generally well controlled, except for few highs. Compliant with medication. Average BG near 140s fasting, and low 100s in afternoon.   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 Started on Septra for paronychia. Some improvement with this.  Wt Readings from Last 3 Encounters:  10/14/15 244 lb 6.4 oz (110.9 kg)  09/04/15 250 lb 3.2 oz (113.5 kg)  07/19/15 249 lb 6.4 oz (113.1 kg)   BP Readings from Last 3 Encounters:  10/14/15 126/76  09/04/15 134/88  07/19/15 (!) 132/96    Past Medical History:  Diagnosis Date  . Chicken pox   . Diabetes mellitus   . GERD (gastroesophageal reflux disease)   . Hyperlipidemia   . Hypertension   . UTI (lower urinary tract infection)    Family History  Problem Relation Age of Onset  . Heart disease Father   . Hyperlipidemia Father   . Diabetes Father   . Hyperlipidemia Mother   . Hypertension Mother   . Schizophrenia Mother   . Depression Mother   . Diabetes Mother   . Cancer Maternal Aunt     breast  . Diabetes Maternal Grandmother   . Diabetes Maternal Grandfather   . Heart disease Other    Past Surgical History:  Procedure Laterality Date  . ABLATION  2007   excessive bleeding  . CESAREAN SECTION  1994  . CHOLECYSTECTOMY  1993  . ORIF FEMUR FRACTURE Right    [place]  . TUBAL LIGATION    . uterine ablation     Social History   Social History  . Marital status: Married    Spouse name: N/A  . Number of children: N/A  . Years of education: N/A   Social History Main Topics  . Smoking status: Current Every Day Smoker    Packs/day: 0.50    Years: 10.00    Types: Cigarettes  . Smokeless tobacco: Never Used     Comment: smoked < 1 pack a day  . Alcohol use 0.0 oz/week     Comment: weekend 3-4drinks  . Drug use:  No  . Sexual activity: Yes    Birth control/ protection: None   Other Topics Concern  . None   Social History Narrative   Lives in [place].      Work - [employer]      Diet - regular    Review of Systems  Constitutional: Negative for appetite change, chills, fatigue, fever and unexpected weight change.  Eyes: Negative for visual disturbance.  Respiratory: Negative for shortness of breath.   Cardiovascular: Negative for chest pain and leg swelling.  Gastrointestinal: Negative for abdominal pain, constipation, diarrhea, nausea and vomiting.  Musculoskeletal: Negative for arthralgias and myalgias.  Skin: Negative for color change and rash.  Hematological: Negative for adenopathy. Does not bruise/bleed easily.  Psychiatric/Behavioral: Negative for dysphoric mood and sleep disturbance. The patient is not nervous/anxious.        Objective:    BP 126/76 (BP Location: Left Arm, Patient Position: Sitting, Cuff Size: Large)   Pulse 92   Wt 244 lb 6.4 oz (110.9 kg)   SpO2 98%   BMI 35.07 kg/m  Physical Exam  Constitutional: She is oriented to person, place, and  time. She appears well-developed and well-nourished. No distress.  HENT:  Head: Normocephalic and atraumatic.  Right Ear: External ear normal.  Left Ear: External ear normal.  Nose: Nose normal.  Mouth/Throat: Oropharynx is clear and moist. No oropharyngeal exudate.  Eyes: Conjunctivae are normal. Pupils are equal, round, and reactive to light. Right eye exhibits no discharge. Left eye exhibits no discharge. No scleral icterus.  Neck: Normal range of motion. Neck supple. No tracheal deviation present. No thyromegaly present.  Cardiovascular: Normal rate, regular rhythm, normal heart sounds and intact distal pulses.  Exam reveals no gallop and no friction rub.   No murmur heard. Pulmonary/Chest: Effort normal and breath sounds normal. No respiratory distress. She has no wheezes. She has no rales. She exhibits  no tenderness.  Musculoskeletal: Normal range of motion. She exhibits no edema or tenderness.  Lymphadenopathy:    She has no cervical adenopathy.  Neurological: She is alert and oriented to person, place, and time. No cranial nerve deficit. She exhibits normal muscle tone. Coordination normal.  Skin: Skin is warm and dry. No rash noted. She is not diaphoretic. No erythema. No pallor.  Psychiatric: She has a normal mood and affect. Her behavior is normal. Judgment and thought content normal.          Assessment & Plan:   Problem List Items Addressed This Visit      High   Diabetes type 2, controlled (HCC) - Primary (Chronic)    BG well controlled by report. Will continue Victoza and Lantus.. A1c with labs.      Relevant Medications   Insulin Glargine (LANTUS SOLOSTAR) 100 UNIT/ML Solostar Pen   Liraglutide (VICTOZA) 18 MG/3ML SOPN   Other Relevant Orders   Comprehensive metabolic panel   Hemoglobin A1c   Essential hypertension (Chronic)    BP Readings from Last 3 Encounters:  10/14/15 126/76  09/04/15 134/88  07/19/15 (!) 132/96   BP well controlled. Renal function with labs. Continue current medications.      Hyperlipidemia (Chronic)    Will check LFTs with labs today.  Pt is intolerant of statins.        Unprioritized   Obesity (BMI 30-39.9) (Chronic)    Wt Readings from Last 3 Encounters:  10/14/15 244 lb 6.4 oz (110.9 kg)  09/04/15 250 lb 3.2 oz (113.5 kg)  07/19/15 249 lb 6.4 oz (113.1 kg)   Body mass index is 35.07 kg/m.  Congratulated pt on weight loss. Encouraged continued efforts at healthy diet and exercise.      Relevant Medications   Insulin Glargine (LANTUS SOLOSTAR) 100 UNIT/ML Solostar Pen   Liraglutide (VICTOZA) 18 MG/3ML SOPN    Other Visit Diagnoses   None.      Return in about 3 months (around 01/14/2016) for New Patient.  Ronna Polio, MD Internal Medicine [facility]

## 2015-10-14 NOTE — Assessment & Plan Note (Signed)
Will check LFTs with labs today.  Pt is intolerant of statins.

## 2015-10-14 NOTE — Progress Notes (Signed)
Pre visit review using our clinic review tool, if applicable. No additional management support is needed unless otherwise documented below in the visit note. 

## 2015-10-14 NOTE — Patient Instructions (Signed)
Labs today.  Follow up in 3 months with Rennie Plowman.

## 2015-10-14 NOTE — Assessment & Plan Note (Signed)
BP Readings from Last 3 Encounters:  10/14/15 126/76  09/04/15 134/88  07/19/15 (!) 132/96   BP well controlled. Renal function with labs. Continue current medications.

## 2015-10-15 LAB — COMPREHENSIVE METABOLIC PANEL
ALT: 16 U/L (ref 0–35)
AST: 16 U/L (ref 0–37)
Albumin: 4.3 g/dL (ref 3.5–5.2)
Alkaline Phosphatase: 54 U/L (ref 39–117)
BUN: 16 mg/dL (ref 6–23)
CO2: 23 meq/L (ref 19–32)
Calcium: 9.7 mg/dL (ref 8.4–10.5)
Chloride: 102 mEq/L (ref 96–112)
Creatinine, Ser: 1.03 mg/dL (ref 0.40–1.20)
GFR: 74.73 mL/min (ref >60.00)
GLUCOSE: 157 mg/dL — AB (ref 70–99)
POTASSIUM: 4.3 meq/L (ref 3.5–5.1)
SODIUM: 134 meq/L — AB (ref 135–145)
Total Bilirubin: 0.4 mg/dL (ref 0.2–1.2)
Total Protein: 7.2 g/dL (ref 6.0–8.3)

## 2015-10-15 LAB — HEMOGLOBIN A1C: Hgb A1c MFr Bld: 6.5 % (ref 4.6–6.5)

## 2015-10-21 ENCOUNTER — Encounter: Payer: Self-pay | Admitting: Internal Medicine

## 2015-10-29 ENCOUNTER — Ambulatory Visit: Payer: Self-pay | Admitting: Family

## 2015-10-29 DIAGNOSIS — Z0289 Encounter for other administrative examinations: Secondary | ICD-10-CM

## 2015-10-31 ENCOUNTER — Ambulatory Visit (INDEPENDENT_AMBULATORY_CARE_PROVIDER_SITE_OTHER): Payer: 59 | Admitting: Family

## 2015-10-31 ENCOUNTER — Encounter: Payer: Self-pay | Admitting: Family

## 2015-10-31 DIAGNOSIS — B3731 Acute candidiasis of vulva and vagina: Secondary | ICD-10-CM

## 2015-10-31 DIAGNOSIS — B373 Candidiasis of vulva and vagina: Secondary | ICD-10-CM | POA: Diagnosis not present

## 2015-10-31 DIAGNOSIS — N760 Acute vaginitis: Secondary | ICD-10-CM

## 2015-10-31 DIAGNOSIS — E119 Type 2 diabetes mellitus without complications: Secondary | ICD-10-CM

## 2015-10-31 MED ORDER — FLUCONAZOLE 150 MG PO TABS: 150.0000 mg | ORAL_TABLET | Freq: Once | ORAL | 2 refills | Status: AC

## 2015-10-31 NOTE — Assessment & Plan Note (Signed)
Vaginal itching consistent with candidiasis. Patient politely declines pelvic exam today. We jointly agreed that we will treat for presumptive candidiasis.

## 2015-10-31 NOTE — Progress Notes (Signed)
Pre visit review using our clinic review tool, if applicable. No additional management support is needed unless otherwise documented below in the visit note. 

## 2015-10-31 NOTE — Progress Notes (Signed)
Subjective:    Patient ID: Julia Brown, female    DOB: October 30, 1970, 45 y.o.   MRN: [id number]  CC: Julia Brown is a 45 y.o. female who presents today for fan acute visit.   HPI: Patient is here for an acute visit, chief complaint of reoccurring yeast infections. Vaginal itching started 4 days ago. Took one diflucan 3 days ago and symptoms resolved, then vaginal itching recurred yesterday. Once to twice per 6 months, has thick, foul smelling discharge with tinge of blood. No thick white discharge currently. No dysuria, flank pain.   Last A1c 6.52 weeks ago. Was seen in our office 06/2015 for vulvovaginitis candidiasis. A month ago she also had bacterial vaginosis, although not formally diagnosed.  Patient is on Lantus, Inokana, Victoza, Glucophage for diabetes. Fasting BS yesterday 150.  Has recently decreased Lantus from 60 to 40 units. Patient states prior PCP planned to decrease to 30 units.  H/o uterine ablation 10 years due to heavy periods; at the time she also had large ovarian cyst removed. No GYN cancer.         HISTORY:  Past Medical History:  Diagnosis Date  . Chicken pox   . Diabetes mellitus   . GERD (gastroesophageal reflux disease)   . Hyperlipidemia   . Hypertension   . UTI (lower urinary tract infection)    Past Surgical History:  Procedure Laterality Date  . ABLATION  2007   excessive bleeding  . CESAREAN SECTION  1994  . CHOLECYSTECTOMY  1993  . ORIF FEMUR FRACTURE Right    [place]  . TUBAL LIGATION    . uterine ablation     Family History  Problem Relation Age of Onset  . Heart disease Father   . Hyperlipidemia Father   . Diabetes Father   . Hyperlipidemia Mother   . Hypertension Mother   . Schizophrenia Mother   . Depression Mother   . Diabetes Mother   . Cancer Maternal Aunt     breast  . Diabetes Maternal Grandmother   . Diabetes Maternal Grandfather   . Heart disease Other     Allergies: Statins and Penicillins Current  Outpatient Prescriptions on File Prior to Visit  Medication Sig Dispense Refill  . carvedilol (COREG) 3.125 MG tablet TAKE 1 TABLET BY MOUTH TWICE DAILY WITH A MEAL 180 tablet 6  . Insulin Glargine (LANTUS SOLOSTAR) 100 UNIT/ML Solostar Pen Inject 40 Units into the skin daily. 5 pen 12  . INVOKANA 300 MG TABS tablet TAKE 1 TABLET BY MOUTH DAILY BEFORE BREAKFAST 30 tablet 6  . Liraglutide (VICTOZA) 18 MG/3ML SOPN INJECT 0.3 MLS (1.8 MG TOTAL) INTO THE SKIN DAILY. 9 mL 11  . meloxicam (MOBIC) 15 MG tablet Take 15 mg by mouth daily.    . metFORMIN (GLUCOPHAGE) 500 MG tablet Take 500 mg in the morning and 1000 mg at night time (Patient taking differently: 500 mg daily with breakfast. ) 90 tablet 3  . metroNIDAZOLE (FLAGYL) 500 MG tablet Take 1 tablet (500 mg total) by mouth 2 (two) times daily. 14 tablet 0  . omeprazole (PRILOSEC) 40 MG capsule TAKE ONE CAPSULE BY MOUTH DAILY 30 capsule 6  . quinapril (ACCUPRIL) 20 MG tablet Take 1 tablet (20 mg total) by mouth at bedtime. 30 tablet 0  . UNIFINE PENTIPS 31G X 5 MM MISC USE AS DIRECTED 200 each 6  . VITAMIN D, ERGOCALCIFEROL, PO Take 50,000 Int'l Units by mouth.      No  current facility-administered medications on file prior to visit.     Social History  Substance Use Topics  . Smoking status: Current Every Day Smoker    Packs/day: 0.50    Years: 10.00    Types: Cigarettes  . Smokeless tobacco: Never Used     Comment: smoked < 1 pack a day  . Alcohol use 0.0 oz/week     Comment: weekend 3-4drinks    Review of Systems  Endocrine: Negative for polydipsia, polyphagia and polyuria.  Genitourinary: Negative for decreased urine volume, dysuria, vaginal bleeding, vaginal discharge and vaginal pain.      Objective:    BP 120/68 (BP Location: Right Arm, Patient Position: Sitting, Cuff Size: Large)   Pulse 88   Temp 98.1 F (36.7 C) (Oral)   Wt 243 lb 6.4 oz (110.4 kg)   SpO2 99%   BMI 34.92 kg/m  BP Readings from Last 3 Encounters:    10/31/15 120/68  10/14/15 126/76  09/04/15 134/88   Wt Readings from Last 3 Encounters:  10/31/15 243 lb 6.4 oz (110.4 kg)  10/14/15 244 lb 6.4 oz (110.9 kg)  09/04/15 250 lb 3.2 oz (113.5 kg)    Physical Exam  Constitutional: She appears well-developed and well-nourished.  Eyes: Conjunctivae are normal.  Cardiovascular: Normal rate, regular rhythm, normal heart sounds and normal pulses.   Pulmonary/Chest: Effort normal and breath sounds normal. She has no wheezes. She has no rhonchi. She has no rales.  Neurological: She is alert.  Skin: Skin is warm and dry.  Psychiatric: She has a normal mood and affect. Her speech is normal and behavior is normal. Thought content normal.  Vitals reviewed.      Assessment & Plan:   Problem List Items Addressed This Visit      Endocrine   Diabetes type 2, controlled (HCC) (Chronic)    Diabetes well controlled on current regimen. We will draw A1c when the patient returns for follow-up in 2 months. If persistent yeast infection, consider Invokana as exacerbating this complaint. Discussed endocrine consult for recommendations if no improvement.          Genitourinary   Vulvovaginal candidiasis    Vaginal itching consistent with candidiasis. Patient politely declines pelvic exam today. We jointly agreed that we will treat for presumptive candidiasis.       Relevant Medications   fluconazole (DIFLUCAN) 150 MG tablet    Other Visit Diagnoses    Vaginitis and vulvovaginitis       Relevant Medications   fluconazole (DIFLUCAN) 150 MG tablet       I have discontinued Ms. Gapinski's Vitamin D (Ergocalciferol). I am also having her maintain her quinapril, metFORMIN, meloxicam, (VITAMIN D, ERGOCALCIFEROL, PO), omeprazole, carvedilol, UNIFINE PENTIPS, metroNIDAZOLE, INVOKANA, Insulin Glargine, Liraglutide, and fluconazole.   Meds ordered this encounter  Medications  . DISCONTD: Vitamin D, Ergocalciferol, (DRISDOL) 50000 units CAPS capsule     Refill:  6  . fluconazole (DIFLUCAN) 150 MG tablet    Sig: Take 1 tablet (150 mg total) by mouth once.    Dispense:  1 tablet    Refill:  2    Order Specific Question:   Supervising Provider    Answer:   Sherlene ShamsULLO, TERESA L [[id number]]    Return precautions given.   Risks, benefits, and alternatives of the medications and treatment plan prescribed today were discussed, and patient expressed understanding.   Education regarding symptom management and diagnosis given to patient on AVS.  Continue to follow with WALKER,JENNIFER AZBELL,  MD for routine health maintenance.   [name] and I agreed with plan.   Rennie Plowman, FNP

## 2015-10-31 NOTE — Assessment & Plan Note (Addendum)
Diabetes well controlled on current regimen. We will draw A1c when the patient returns for follow-up in 2 months. If persistent yeast infection, consider Invokana as exacerbating this complaint. Discussed endocrine consult for recommendations if no improvement.

## 2015-10-31 NOTE — Patient Instructions (Signed)
Continue your great work with diabetes.   As we discussed, if you continue to have persistent yeast infection, I would like for you to see endocrine to see what they may recommend with your diabetic regimen.  We'll cross that bridge when you come back for follow-up this fall :)   Monilial Vaginitis Vaginitis in a soreness, swelling and redness (inflammation) of the vagina and vulva. Monilial vaginitis is not a sexually transmitted infection. CAUSES  Yeast vaginitis is caused by yeast (candida) that is normally found in your vagina. With a yeast infection, the candida has overgrown in number to a point that upsets the chemical balance. SYMPTOMS   White, thick vaginal discharge.  Swelling, itching, redness and irritation of the vagina and possibly the lips of the vagina (vulva).  Burning or painful urination.  Painful intercourse. DIAGNOSIS  Things that may contribute to monilial vaginitis are:  Postmenopausal and virginal states.  Pregnancy.  Infections.  Being tired, sick or stressed, especially if you had monilial vaginitis in the past.  Diabetes. Good control will help lower the chance.  Birth control pills.  Tight fitting garments.  Using bubble bath, feminine sprays, douches or deodorant tampons.  Taking certain medications that kill germs (antibiotics).  Sporadic recurrence can occur if you become ill. TREATMENT  Your caregiver will give you medication.  There are several kinds of anti monilial vaginal creams and suppositories specific for monilial vaginitis. For recurrent yeast infections, use a suppository or cream in the vagina 2 times a week, or as directed.  Anti-monilial or steroid cream for the itching or irritation of the vulva may also be used. Get your caregiver's permission.  Painting the vagina with methylene blue solution may help if the monilial cream does not work.  Eating yogurt may help prevent monilial vaginitis. HOME CARE INSTRUCTIONS    Finish all medication as prescribed.  Do not have sex until treatment is completed or after your caregiver tells you it is okay.  Take warm sitz baths.  Do not douche.  Do not use tampons, especially scented ones.  Wear cotton underwear.  Avoid tight pants and panty hose.  Tell your sexual partner that you have a yeast infection. They should go to their caregiver if they have symptoms such as mild rash or itching.  Your sexual partner should be treated as well if your infection is difficult to eliminate.  Practice safer sex. Use condoms.  Some vaginal medications cause latex condoms to fail. Vaginal medications that harm condoms are:  Cleocin cream.  Butoconazole (Femstat).  Terconazole (Terazol) vaginal suppository.  Miconazole (Monistat) (may be purchased over the counter). SEEK MEDICAL CARE IF:   You have a temperature by mouth above 102 F (38.9 C).  The infection is getting worse after 2 days of treatment.  The infection is not getting better after 3 days of treatment.  You develop blisters in or around your vagina.  You develop vaginal bleeding, and it is not your menstrual period.  You have pain when you urinate.  You develop intestinal problems.  You have pain with sexual intercourse.   This information is not intended to replace advice given to you by your health care provider. Make sure you discuss any questions you have with your health care provider.   Document Released: 12/17/2004 Document Revised: 06/01/2011 Document Reviewed: 09/10/2014 Elsevier Interactive Patient Education [employer]2016 [employer]

## 2015-11-18 ENCOUNTER — Ambulatory Visit: Payer: Self-pay

## 2015-11-20 ENCOUNTER — Ambulatory Visit: Payer: Self-pay

## 2015-12-11 ENCOUNTER — Ambulatory Visit: Payer: Self-pay

## 2015-12-11 ENCOUNTER — Other Ambulatory Visit: Payer: Self-pay | Admitting: Physician Assistant

## 2015-12-11 NOTE — Telephone Encounter (Signed)
Med refill approved, pt has recently seen her pcp and kidney functions are good

## 2015-12-16 ENCOUNTER — Other Ambulatory Visit: Payer: Self-pay

## 2015-12-16 VITALS — BP 130/64 | HR 85 | Ht 70.0 in | Wt 239.0 lb

## 2015-12-16 DIAGNOSIS — E119 Type 2 diabetes mellitus without complications: Secondary | ICD-10-CM

## 2015-12-16 DIAGNOSIS — Z794 Long term (current) use of insulin: Principal | ICD-10-CM

## 2015-12-16 NOTE — Patient Outreach (Signed)
[facility]) Care Management  [place]  12/16/2015   COHEN DOLEMAN 1971-01-16 [id number]  Subjective: Met with patient today in the Link to Wellness program regarding her diabetes.  She is not checking her blood sugars and is not exercising.  Her last A1C was 6.5%.  She recently had to see the MD for vaginal itching.  This may be related to her trips  to [place] when she tends to take in more carbohydrates (including beer) - she generally stops her Victoza when she's there because she wants to eat (and the Victoza makes her feel full).  She has also noticed a correlation between sexual activity and itchiness- I have stressed the importance of good personal hygiene post sexual intercourse to decrease side effects. She denies hypoglycemia symptoms.  Objective:  Vitals:   12/16/15 1424  BP: 130/64  Pulse: 85  SpO2: 98%  Weight: 239 lb (108.4 kg)  Height: 1.778 m ('5\' 10"'$ )     Encounter Medications:  Outpatient Encounter Prescriptions as of 12/16/2015  Medication Sig  . carvedilol (COREG) 3.125 MG tablet TAKE 1 TABLET BY MOUTH TWICE DAILY WITH A MEAL  . Insulin Glargine (LANTUS SOLOSTAR) 100 UNIT/ML Solostar Pen Inject 40 Units into the skin daily.  . INVOKANA 300 MG TABS tablet TAKE 1 TABLET BY MOUTH DAILY BEFORE BREAKFAST  . Liraglutide (VICTOZA) 18 MG/3ML SOPN INJECT 0.3 MLS (1.8 MG TOTAL) INTO THE SKIN DAILY.  . meloxicam (MOBIC) 15 MG tablet TAKE ONE TABLET BY MOUTH EVERY DAY  . metFORMIN (GLUCOPHAGE) 500 MG tablet Take 500 mg in the morning and 1000 mg at night time (Patient taking differently: 500 mg daily with breakfast. )  . omeprazole (PRILOSEC) 40 MG capsule TAKE ONE CAPSULE BY MOUTH DAILY  . quinapril (ACCUPRIL) 20 MG tablet Take 1 tablet (20 mg total) by mouth at bedtime.  Marland Kitchen UNIFINE PENTIPS 31G X 5 MM MISC USE AS DIRECTED  . VITAMIN D, ERGOCALCIFEROL, PO Take 50,000 Int'l Units by mouth.   . metroNIDAZOLE (FLAGYL) 500 MG tablet Take 1 tablet (500  mg total) by mouth 2 (two) times daily. (Patient not taking: Reported on 12/16/2015)   No facility-administered encounter medications on file as of 12/16/2015.     Functional Status:  In your present state of health, do you have any difficulty performing the following activities: 02/11/2015  Hearing? N  Vision? N  Difficulty concentrating or making decisions? N  Walking or climbing stairs? N  Dressing or bathing? N  Doing errands, shopping? N  Some recent data might be hidden    Fall/Depression Screening: PHQ 2/9 Scores 10/31/2015 09/04/2015 07/22/2015 01/31/2015 11/12/2014  PHQ - 2 Score 0 0 0 0 0     Assessment: Discussed the importance of checking blood sugars, eating a healthy well balanced diet, quit smoking and the benefits of exercise. Encouraged to take all medications as ordered.  orde Plan:   [facility] CM Care Plan Problem One   Flowsheet Row Most Recent Value  Care Plan Problem One  Potential for low blood suagrs  Role Documenting the Problem One  Care Management Coordinator  Care Plan for Problem One  Active  [facility] Long Term Goal (31-90 days)  Patient will have no low blood sugars in the next 3 months  [facility] Long Term Goal Start Date  07/22/15  Interventions for Problem One Long Term Goal  Discussed the need for breakfast before work and the importance of eating at regular intervals.  Follow up in January in the Link to Wellness.    Gentry Fitz, RN, BA, [place], [place] Direct Dial:  [phone]  Fax:  [phone] E-mail: [email or web address] [address], [place], [place]  [address]

## 2016-01-02 ENCOUNTER — Encounter: Payer: Self-pay | Admitting: Physician Assistant

## 2016-01-02 ENCOUNTER — Ambulatory Visit: Payer: Self-pay | Admitting: Physician Assistant

## 2016-01-02 DIAGNOSIS — J029 Acute pharyngitis, unspecified: Secondary | ICD-10-CM

## 2016-01-02 DIAGNOSIS — R52 Pain, unspecified: Secondary | ICD-10-CM

## 2016-01-02 DIAGNOSIS — R300 Dysuria: Secondary | ICD-10-CM

## 2016-01-02 DIAGNOSIS — N390 Urinary tract infection, site not specified: Secondary | ICD-10-CM

## 2016-01-02 DIAGNOSIS — R319 Hematuria, unspecified: Secondary | ICD-10-CM

## 2016-01-02 LAB — POCT INFLUENZA A/B
INFLUENZA B, POC: POSITIVE — AB
Influenza A, POC: NEGATIVE

## 2016-01-02 LAB — POCT URINALYSIS DIPSTICK
BILIRUBIN UA: NEGATIVE
Ketones, UA: NEGATIVE
NITRITE UA: NEGATIVE
PH UA: 6
PROTEIN UA: NEGATIVE
Spec Grav, UA: 1.02
Urobilinogen, UA: 0.2

## 2016-01-02 LAB — POCT RAPID STREP A (OFFICE): RAPID STREP A SCREEN: NEGATIVE

## 2016-01-02 MED ORDER — IBUPROFEN 800 MG PO TABS: 800.0000 mg | ORAL_TABLET | Freq: Three times a day (TID) | ORAL | 0 refills | Status: DC | PRN

## 2016-01-02 MED ORDER — CIPROFLOXACIN HCL 500 MG PO TABS: 500.0000 mg | ORAL_TABLET | Freq: Two times a day (BID) | ORAL | 0 refills | Status: DC

## 2016-01-02 MED ORDER — OSELTAMIVIR PHOSPHATE 75 MG PO CAPS: 75.0000 mg | ORAL_CAPSULE | Freq: Two times a day (BID) | ORAL | 0 refills | Status: DC

## 2016-01-02 NOTE — Addendum Note (Signed)
Addended by: Faythe GheeFISHER, Nargis Abrams W on: 01/02/2016 09:37 AM   Modules accepted: Orders

## 2016-01-02 NOTE — Progress Notes (Addendum)
S: C/o runny nose and congestion with sore throat for 1 days, + fever, chills, denies cp/sob, v/d; very achey, no tick bites Using otc meds: robitussin  O: PE: vitals wnl, nad,  perrl eomi, normocephalic, tms dull, nasal mucosa red and swollen, throat injected, neck supple no lymph, lungs c t a, cv rrr, neuro intact, flu swab neg, ua trace leuks, q strep neg  A:  Acute flu like illness, uti  P: drink fluids, continue regular meds , use otc meds of choice, return if not improving in 5 days, return earlier if worsening , cipro 500mg  bid x 3d, ibuprofen 800mg  tid  After flu swab sat for a few extra minutes, turned B+, tamiflu sent to pharmacy, pt notified

## 2016-01-14 ENCOUNTER — Encounter: Payer: Self-pay | Admitting: Family

## 2016-01-14 ENCOUNTER — Ambulatory Visit (INDEPENDENT_AMBULATORY_CARE_PROVIDER_SITE_OTHER): Payer: 59 | Admitting: Family

## 2016-01-14 VITALS — BP 150/100 | HR 92 | Temp 98.0°F | Ht 70.0 in | Wt 239.2 lb

## 2016-01-14 DIAGNOSIS — I10 Essential (primary) hypertension: Secondary | ICD-10-CM | POA: Diagnosis not present

## 2016-01-14 DIAGNOSIS — F411 Generalized anxiety disorder: Secondary | ICD-10-CM | POA: Insufficient documentation

## 2016-01-14 DIAGNOSIS — E785 Hyperlipidemia, unspecified: Secondary | ICD-10-CM

## 2016-01-14 DIAGNOSIS — E119 Type 2 diabetes mellitus without complications: Secondary | ICD-10-CM | POA: Diagnosis not present

## 2016-01-14 LAB — COMPREHENSIVE METABOLIC PANEL
ALK PHOS: 60 U/L (ref 39–117)
ALT: 18 U/L (ref 0–35)
AST: 13 U/L (ref 0–37)
Albumin: 4.2 g/dL (ref 3.5–5.2)
BILIRUBIN TOTAL: 0.7 mg/dL (ref 0.2–1.2)
BUN: 12 mg/dL (ref 6–23)
CALCIUM: 9.7 mg/dL (ref 8.4–10.5)
CO2: 30 meq/L (ref 19–32)
Chloride: 107 mEq/L (ref 96–112)
Creatinine, Ser: 0.83 mg/dL (ref 0.40–1.20)
GFR: 95.77 mL/min (ref >60.00)
Glucose, Bld: 118 mg/dL — ABNORMAL HIGH (ref 70–99)
Potassium: 4.5 mEq/L (ref 3.5–5.1)
Sodium: 144 mEq/L (ref 135–145)
Total Protein: 6.8 g/dL (ref 6.0–8.3)

## 2016-01-14 LAB — LIPID PANEL
CHOLESTEROL: 212 mg/dL — AB (ref 0–200)
HDL: 55 mg/dL (ref >39.00)
LDL CALC: 134 mg/dL — AB (ref 0–99)
NONHDL: 157.28
Total CHOL/HDL Ratio: 4
Triglycerides: 115 mg/dL (ref 0.0–149.0)
VLDL: 23 mg/dL (ref 0.0–40.0)

## 2016-01-14 LAB — HEMOGLOBIN A1C: Hgb A1c MFr Bld: 6.9 % — ABNORMAL HIGH (ref 4.6–6.5)

## 2016-01-14 MED ORDER — ALPRAZOLAM 0.25 MG PO TABS: 0.2500 mg | ORAL_TABLET | Freq: Two times a day (BID) | ORAL | 0 refills | Status: DC | PRN

## 2016-01-14 MED ORDER — DULOXETINE HCL 30 MG PO CPEP: 30.0000 mg | ORAL_CAPSULE | Freq: Two times a day (BID) | ORAL | 3 refills | Status: DC

## 2016-01-14 NOTE — Progress Notes (Signed)
Pre visit review using our clinic review tool, if applicable. No additional management support is needed unless otherwise documented below in the visit note. 

## 2016-01-14 NOTE — Assessment & Plan Note (Addendum)
New.Generalized anxiety especially surrounding funeral this week. Trial of short-term Xanax only, instructions for patient to use as needed surrounding funeral as well as while mother is in the hospital. For the long-term, patient and I agreed patient will trial Cymbalta as this may also help with her low back pain for which she's been taking ibuprofen. Note this has been discontinued.F/u 6-8 weeks.

## 2016-01-14 NOTE — Patient Instructions (Signed)
As we discussed, Xanax is an as needed medication for anxiety especially surrounding the funeral this week.   For the long-term, I would much prefer you to be on medication such as Cymbalta as I have  prescribed. Occasionally, this medication may cause elevation in blood pressure, heart rate eand please let me know if you experience this.   Blood pressures elevated today, I would like for you to monitor with a blood pressure log and call me in the next couple days with readings. We can adjust  medications from there. Please ensure to make a follow-up appointment 6-8 weeks by one and see how you're doing on the Cymbalta.  Managing Your High Blood Pressure Blood pressure is a measurement of how forceful your blood is pressing against the walls of the arteries. Arteries are muscular tubes within the circulatory system. Blood pressure does not stay the same. Blood pressure rises when you are active, excited, or nervous; and it lowers during sleep and relaxation. If the numbers measuring your blood pressure stay above normal most of the time, you are at risk for health problems. High blood pressure (hypertension) is a long-term (chronic) condition in which blood pressure is elevated. A blood pressure reading is recorded as two numbers, such as 120 over 80 (or 120/80). The first, higher number is called the systolic pressure. It is a measure of the pressure in your arteries as the heart beats. The second, lower number is called the diastolic pressure. It is a measure of the pressure in your arteries as the heart relaxes between beats.  Keeping your blood pressure in a normal range is important to your overall health and prevention of health problems, such as heart disease and stroke. When your blood pressure is uncontrolled, your heart has to work harder than normal. High blood pressure is a very common condition in adults because blood pressure tends to rise with age. Men and women are equally likely to have  hypertension but at different times in life. Before age 245, men are more likely to have hypertension. After 44 years of age, women are more likely to have it. Hypertension is especially common in African Americans. This condition often has no signs or symptoms. The cause of the condition is usually not known. Your caregiver can help you come up with a plan to keep your blood pressure in a normal, healthy range. BLOOD PRESSURE STAGES Blood pressure is classified into four stages: normal, prehypertension, stage 1, and stage 2. Your blood pressure reading will be used to determine what type of treatment, if any, is necessary. Appropriate treatment options are tied to these four stages:  Normal  Systolic pressure (mm Hg): below 120.  Diastolic pressure (mm Hg): below 80. Prehypertension  Systolic pressure (mm Hg): 120 to 139.  Diastolic pressure (mm Hg): 80 to 89. Stage1  Systolic pressure (mm Hg): 140 to 159.  Diastolic pressure (mm Hg): 90 to 99. Stage2  Systolic pressure (mm Hg): 160 or above.  Diastolic pressure (mm Hg): 100 or above. RISKS RELATED TO HIGH BLOOD PRESSURE Managing your blood pressure is an important responsibility. Uncontrolled high blood pressure can lead to:  A heart attack.  A stroke.  A weakened blood vessel (aneurysm).  Heart failure.  Kidney damage.  Eye damage.  Metabolic syndrome.  Memory and concentration problems. HOW TO MANAGE YOUR BLOOD PRESSURE Blood pressure can be managed effectively with lifestyle changes and medicines (if needed). Your caregiver will help you come up with a plan to  bring your blood pressure within a normal range. Your plan should include the following: Education  Read all information provided by your caregivers about how to control blood pressure.  Educate yourself on the latest guidelines and treatment recommendations. New research is always being done to further define the risks and treatments for high blood  pressure. Lifestylechanges  Control your weight.  Avoid smoking.  Stay physically active.  Reduce the amount of salt in your diet.  Reduce stress.  Control any chronic conditions, such as high cholesterol or diabetes.  Reduce your alcohol intake. Medicines  Several medicines (antihypertensive medicines) are available, if needed, to bring blood pressure within a normal range. Communication  Review all the medicines you take with your caregiver because there may be side effects or interactions.  Talk with your caregiver about your diet, exercise habits, and other lifestyle factors that may be contributing to high blood pressure.  See your caregiver regularly. Your caregiver can help you create and adjust your plan for managing high blood pressure. RECOMMENDATIONS FOR TREATMENT AND FOLLOW-UP  The following recommendations are based on current guidelines for managing high blood pressure in nonpregnant adults. Use these recommendations to identify the proper follow-up period or treatment option based on your blood pressure reading. You can discuss these options with your caregiver.  Systolic pressure of 120 to 139 or diastolic pressure of 80 to 89: Follow up with your caregiver as directed.  Systolic pressure of 140 to 160 or diastolic pressure of 90 to 100: Follow up with your caregiver within 2 months.  Systolic pressure above 160 or diastolic pressure above 100: Follow up with your caregiver within 1 month.  Systolic pressure above 180 or diastolic pressure above 110: Consider antihypertensive therapy; follow up with your caregiver within 1 week.  Systolic pressure above 200 or diastolic pressure above 120: Begin antihypertensive therapy; follow up with your caregiver within 1 week.   This information is not intended to replace advice given to you by your health care provider. Make sure you discuss any questions you have with your health care provider.   Document Released:  12/02/2011 Document Reviewed: 12/02/2011 Elsevier Interactive Patient Education [employer]

## 2016-01-14 NOTE — Assessment & Plan Note (Signed)
Symptomatically stable. Pending A1c. Congratulated patient on her good work.

## 2016-01-14 NOTE — Progress Notes (Signed)
Subjective:    Patient ID: Julia Brown, female    DOB: July 10, 1970, 45 y.o.   MRN: [id number]  CC: [name] is a 45 y.o. female who presents today for follow up.   HPI: Patient here for follow-up.   She also has a complaint of anxiety with loss of 2 cousins in car wreck 3 days ago. Funeral tomorrow. Trouble sleeping. Mother is hospitalized currently for schizophrenia.   DM- Blood sugars have been running 140's fasting AM. Then 110's in the mid morning. She is compliant with medication. Takes lantus 40 units in the morning. No numbness, tingling in LE. No dysuria, change in vaginal yeast infection.   HTN- Elevated. At work she checks BP and averages 114/90. Denies exertional chest pain or pressure, numbness or tingling radiating to left arm or jaw, palpitations, dizziness, frequent headaches, changes in vision, or shortness of breath.      Pap 2017 negative for malignancy; no HPV status   HISTORY:  Past Medical History:  Diagnosis Date  . Chicken pox   . Diabetes mellitus   . GERD (gastroesophageal reflux disease)   . Hyperlipidemia   . Hypertension   . UTI (lower urinary tract infection)    Past Surgical History:  Procedure Laterality Date  . ABLATION  2007   excessive bleeding  . CESAREAN SECTION  1994  . CHOLECYSTECTOMY  1993  . ORIF FEMUR FRACTURE Right    [place]  . TUBAL LIGATION    . uterine ablation     Family History  Problem Relation Age of Onset  . Heart disease Father   . Hyperlipidemia Father   . Diabetes Father   . Hyperlipidemia Mother   . Hypertension Mother   . Schizophrenia Mother   . Depression Mother   . Diabetes Mother   . Heart disease Other   . Cancer Maternal Aunt     breast  . Diabetes Maternal Grandmother   . Diabetes Maternal Grandfather     Allergies: Statins and Penicillins Current Outpatient Prescriptions on File Prior to Visit  Medication Sig Dispense Refill  . carvedilol (COREG) 3.125 MG tablet TAKE 1 TABLET BY  MOUTH TWICE DAILY WITH A MEAL 180 tablet 6  . Insulin Glargine (LANTUS SOLOSTAR) 100 UNIT/ML Solostar Pen Inject 40 Units into the skin daily. 5 pen 12  . INVOKANA 300 MG TABS tablet TAKE 1 TABLET BY MOUTH DAILY BEFORE BREAKFAST 30 tablet 6  . Liraglutide (VICTOZA) 18 MG/3ML SOPN INJECT 0.3 MLS (1.8 MG TOTAL) INTO THE SKIN DAILY. 9 mL 11  . metFORMIN (GLUCOPHAGE) 500 MG tablet Take 500 mg in the morning and 1000 mg at night time (Patient taking differently: 500 mg daily with breakfast. ) 90 tablet 3  . omeprazole (PRILOSEC) 40 MG capsule TAKE ONE CAPSULE BY MOUTH DAILY 30 capsule 6  . quinapril (ACCUPRIL) 20 MG tablet Take 1 tablet (20 mg total) by mouth at bedtime. 30 tablet 0  . UNIFINE PENTIPS 31G X 5 MM MISC USE AS DIRECTED 200 each 6  . VITAMIN D, ERGOCALCIFEROL, PO Take 50,000 Int'l Units by mouth.      No current facility-administered medications on file prior to visit.     Social History  Substance Use Topics  . Smoking status: Current Every Day Smoker    Packs/day: 0.50    Years: 10.00    Types: Cigarettes  . Smokeless tobacco: Never Used     Comment: smoked < 1 pack a day  .  Alcohol use 0.0 oz/week     Comment: weekend 3-4drinks    Review of Systems  Constitutional: Negative for chills and fever.  Eyes: Negative for visual disturbance.  Respiratory: Negative for cough.   Cardiovascular: Negative for chest pain and palpitations.  Gastrointestinal: Negative for nausea and vomiting.  Endocrine: Negative for polyuria.  Neurological: Negative for dizziness and headaches.  Psychiatric/Behavioral: The patient is nervous/anxious.       Objective:    BP (!) 150/100   Pulse 92   Temp 98 F (36.7 C) (Oral)   Ht 5\' 10"  (1.778 m)   Wt 239 lb 3.2 oz (108.5 kg)   SpO2 98%   BMI 34.32 kg/m  BP Readings from Last 3 Encounters:  01/14/16 (!) 150/100  12/16/15 130/64  10/31/15 120/68   Wt Readings from Last 3 Encounters:  01/14/16 239 lb 3.2 oz (108.5 kg)  12/16/15 239 lb  (108.4 kg)  10/31/15 243 lb 6.4 oz (110.4 kg)    Physical Exam  Constitutional: She appears well-developed and well-nourished.  Eyes: Conjunctivae are normal.  Cardiovascular: Normal rate, regular rhythm, normal heart sounds and normal pulses.   Pulmonary/Chest: Effort normal and breath sounds normal. She has no wheezes. She has no rhonchi. She has no rales.  Neurological: She is alert.  Skin: Skin is warm and dry.  Psychiatric: She has a normal mood and affect. Her speech is normal and behavior is normal. Thought content normal.  Vitals reviewed.      Assessment & Plan:   Problem List Items Addressed This Visit      Cardiovascular and Mediastinum   Essential hypertension (Chronic)    Elevated today. Suspect anxiety is causing elevation. No signs or symptoms of hypertensive urgency or emergency at this time. Patient and I agreed that she monitor blood pressure over the next couple days and call me with her readings. We can adjust medications as needed. Pending CMP.      Relevant Orders   Comprehensive metabolic panel     Endocrine   Diabetes type 2, controlled (HCC) (Chronic)    Symptomatically stable. Pending A1c. Congratulated patient on her good work.      Relevant Orders   Hemoglobin A1c     Other   Hyperlipidemia (Chronic)    Elevated at physical in February of this year. Pending lipid panel.      Relevant Orders   Lipid panel   Anxiety state - Primary    Generalized anxiety especially surrounding funeral this week. Trial of short-term Xanax only, instructions for patient to use as needed surrounding funeral as well as while mother is in the hospital. For the long-term, patient and I agreed patient will trial Cymbalta as this may also help with her low back pain for which she's been taking ibuprofen. Note this has been discontinued.F/u 6-8 weeks.      Relevant Medications   ALPRAZolam (XANAX) 0.25 MG tablet   DULoxetine (CYMBALTA) 30 MG capsule    Other Visit  Diagnoses   None.      I have discontinued Ms. Avants's metroNIDAZOLE, meloxicam, ciprofloxacin, ibuprofen, and oseltamivir. I am also having her start on ALPRAZolam and DULoxetine. Additionally, I am having her maintain her quinapril, metFORMIN, (VITAMIN D, ERGOCALCIFEROL, PO), omeprazole, carvedilol, UNIFINE PENTIPS, INVOKANA, Insulin Glargine, and liraglutide.   Meds ordered this encounter  Medications  . ALPRAZolam (XANAX) 0.25 MG tablet    Sig: Take 1 tablet (0.25 mg total) by mouth 2 (two) times daily as needed for  anxiety.    Dispense:  20 tablet    Refill:  0    Order Specific Question:   Supervising Provider    Answer:   Duncan Dull L [[id number]]  . DULoxetine (CYMBALTA) 30 MG capsule    Sig: Take 1 capsule (30 mg total) by mouth 2 (two) times daily. Take one 30 mg tablet by mouth once a day for the first week. Then increase to one 30 mg tablet by mouth twice daily and stay at that dose thereafter.    Dispense:  60 capsule    Refill:  3    Order Specific Question:   Supervising Provider    Answer:   Sherlene Shams [[id number]]    Return precautions given.   Risks, benefits, and alternatives of the medications and treatment plan prescribed today were discussed, and patient expressed understanding.   Education regarding symptom management and diagnosis given to patient on AVS.  Continue to follow with Wynona Dove, MD for routine health maintenance.   [name] and I agreed with plan.   Rennie Plowman, FNP

## 2016-01-14 NOTE — Assessment & Plan Note (Signed)
Elevated today. Suspect anxiety is causing elevation. No signs or symptoms of hypertensive urgency or emergency at this time. Patient and I agreed that she monitor blood pressure over the next couple days and call me with her readings. We can adjust medications as needed. Pending CMP.

## 2016-01-14 NOTE — Assessment & Plan Note (Signed)
Elevated at physical in February of this year. Pending lipid panel.

## 2016-01-15 ENCOUNTER — Other Ambulatory Visit: Payer: Self-pay | Admitting: Family

## 2016-01-20 ENCOUNTER — Other Ambulatory Visit: Payer: Self-pay | Admitting: Family

## 2016-01-20 DIAGNOSIS — Z1231 Encounter for screening mammogram for malignant neoplasm of breast: Secondary | ICD-10-CM

## 2016-01-22 ENCOUNTER — Ambulatory Visit
Admission: RE | Admit: 2016-01-22 | Discharge: 2016-01-22 | Disposition: A | Discharge status: Home / Self Care | Payer: 59 | Source: Ambulatory Visit | Attending: Family | Admitting: Family

## 2016-01-22 DIAGNOSIS — Z1231 Encounter for screening mammogram for malignant neoplasm of breast: Secondary | ICD-10-CM | POA: Diagnosis not present

## 2016-02-11 ENCOUNTER — Other Ambulatory Visit: Payer: Self-pay | Admitting: Physician Assistant

## 2016-02-21 ENCOUNTER — Other Ambulatory Visit: Payer: Self-pay | Admitting: Physician Assistant

## 2016-02-21 NOTE — Telephone Encounter (Signed)
Med refill approved 

## 2016-02-27 ENCOUNTER — Ambulatory Visit: Payer: Self-pay | Admitting: Family

## 2016-03-05 ENCOUNTER — Encounter: Payer: Self-pay | Admitting: Family

## 2016-03-05 ENCOUNTER — Ambulatory Visit (INDEPENDENT_AMBULATORY_CARE_PROVIDER_SITE_OTHER): Payer: 59 | Admitting: Family

## 2016-03-05 VITALS — BP 130/85 | HR 88 | Temp 98.1°F | Ht 70.0 in | Wt 246.2 lb

## 2016-03-05 DIAGNOSIS — I10 Essential (primary) hypertension: Secondary | ICD-10-CM

## 2016-03-05 DIAGNOSIS — E119 Type 2 diabetes mellitus without complications: Secondary | ICD-10-CM | POA: Diagnosis not present

## 2016-03-05 DIAGNOSIS — F411 Generalized anxiety disorder: Secondary | ICD-10-CM

## 2016-03-05 NOTE — Assessment & Plan Note (Signed)
At goal. Continue current regimen. 

## 2016-03-05 NOTE — Patient Instructions (Signed)
A1c late January - lab appt  F/u appt Feb/March next year  Happy holidays

## 2016-03-05 NOTE — Assessment & Plan Note (Signed)
Pending A1c in 2 months.

## 2016-03-05 NOTE — Progress Notes (Signed)
Pre visit review using our clinic review tool, if applicable. No additional management support is needed unless otherwise documented below in the visit note. 

## 2016-03-05 NOTE — Progress Notes (Signed)
Subjective:    Patient ID: Julia Brown, female    DOB: 05/04/1970, 45 y.o.   MRN: [id number]  CC: Julia Brown is a 45 y.o. female who presents today for follow up.   HPI: Anxiety- only used xanax once. Resolved. Didn't start cymbalta. Started tumeric and chronic pain is getting better.  HTN- Complaint with medication. Averages 119/76. Denies exertional chest pain or pressure, numbness or tingling radiating to left arm or jaw, palpitations, dizziness, frequent headaches, changes in vision, or shortness of breath.   DM-only taken metformin in the morning. She starts diabetic monitoring program in January.      HISTORY:  Past Medical History:  Diagnosis Date  . Chicken pox   . Diabetes mellitus   . GERD (gastroesophageal reflux disease)   . Hyperlipidemia   . Hypertension   . UTI (lower urinary tract infection)    Past Surgical History:  Procedure Laterality Date  . ABLATION  2007   excessive bleeding  . CESAREAN SECTION  1994  . CHOLECYSTECTOMY  1993  . ORIF FEMUR FRACTURE Right    [place]  . TUBAL LIGATION    . uterine ablation     Family History  Problem Relation Age of Onset  . Heart disease Father   . Hyperlipidemia Father   . Diabetes Father   . Hyperlipidemia Mother   . Hypertension Mother   . Schizophrenia Mother   . Depression Mother   . Diabetes Mother   . Heart disease Other   . Cancer Maternal Aunt     breast  . Diabetes Maternal Grandmother   . Diabetes Maternal Grandfather     Allergies: Statins and Penicillins Current Outpatient Prescriptions on File Prior to Visit  Medication Sig Dispense Refill  . ALPRAZolam (XANAX) 0.25 MG tablet Take 1 tablet (0.25 mg total) by mouth 2 (two) times daily as needed for anxiety. 20 tablet 0  . carvedilol (COREG) 3.125 MG tablet TAKE 1 TABLET BY MOUTH TWICE DAILY WITH A MEAL 180 tablet 6  . Insulin Glargine (LANTUS SOLOSTAR) 100 UNIT/ML Solostar Pen Inject 40 Units into the skin daily. 5 pen 12  .  INVOKANA 300 MG TABS tablet TAKE 1 TABLET BY MOUTH DAILY BEFORE BREAKFAST 90 tablet 3  . Liraglutide (VICTOZA) 18 MG/3ML SOPN INJECT 0.3 MLS (1.8 MG TOTAL) INTO THE SKIN DAILY. 9 mL 11  . metFORMIN (GLUCOPHAGE) 500 MG tablet Take 500 mg in the morning and 1000 mg at night time (Patient taking differently: 500 mg daily with breakfast. ) 90 tablet 3  . omeprazole (PRILOSEC) 40 MG capsule TAKE ONE CAPSULE BY MOUTH DAILY 30 capsule 6  . quinapril (ACCUPRIL) 20 MG tablet Take 1 tablet (20 mg total) by mouth at bedtime. 30 tablet 0  . UNIFINE PENTIPS 31G X 5 MM MISC USE AS DIRECTED 200 each 6  . VITAMIN D, ERGOCALCIFEROL, PO Take 50,000 Int'l Units by mouth.      No current facility-administered medications on file prior to visit.     Social History  Substance Use Topics  . Smoking status: Current Every Day Smoker    Packs/day: 0.50    Years: 10.00    Types: Cigarettes  . Smokeless tobacco: Never Used     Comment: smoked < 1 pack a day  . Alcohol use 0.0 oz/week     Comment: weekend 3-4drinks    Review of Systems  Constitutional: Negative for chills and fever.  Eyes: Negative for visual disturbance.  Respiratory:  Negative for cough.   Cardiovascular: Negative for chest pain and palpitations.  Gastrointestinal: Negative for nausea and vomiting.  Neurological: Negative for headaches.      Objective:    BP 130/85   Pulse 88   Temp 98.1 F (36.7 C) (Oral)   Ht 5\' 10"  (1.778 m)   Wt 246 lb 3.2 oz (111.7 kg)   SpO2 98%   BMI 35.33 kg/m  BP Readings from Last 3 Encounters:  03/05/16 130/85  01/14/16 (!) 150/100  12/16/15 130/64   Wt Readings from Last 3 Encounters:  03/05/16 246 lb 3.2 oz (111.7 kg)  01/14/16 239 lb 3.2 oz (108.5 kg)  12/16/15 239 lb (108.4 kg)    Physical Exam  Constitutional: She appears well-developed and well-nourished.  Eyes: Conjunctivae are normal.  Cardiovascular: Normal rate, regular rhythm, normal heart sounds and normal pulses.     Pulmonary/Chest: Effort normal and breath sounds normal. She has no wheezes. She has no rhonchi. She has no rales.  Neurological: She is alert.  Skin: Skin is warm and dry.  Psychiatric: She has a normal mood and affect. Her speech is normal and behavior is normal. Thought content normal.  Vitals reviewed.      Assessment & Plan:   Problem List Items Addressed This Visit      Cardiovascular and Mediastinum   Essential hypertension - Primary (Chronic)    At goal. Continue current regimen.        Endocrine   Diabetes type 2, controlled (HCC) (Chronic)    Pending A1c in 2 months.      Relevant Orders   Hemoglobin A1c     Other   Anxiety state    Resolved. Only had using xanax one time during funeral.          I have discontinued Julia Brown's DULoxetine. I am also having her maintain her quinapril, metFORMIN, (VITAMIN D, ERGOCALCIFEROL, PO), carvedilol, UNIFINE PENTIPS, Insulin Glargine, liraglutide, ALPRAZolam, omeprazole, and INVOKANA.   No orders of the defined types were placed in this encounter.   Return precautions given.   Risks, benefits, and alternatives of the medications and treatment plan prescribed today were discussed, and patient expressed understanding.   Education regarding symptom management and diagnosis given to patient on AVS.  Continue to follow with Wynona DoveWALKER,JENNIFER AZBELL, MD for routine health maintenance.   Julia Brown and I agreed with plan.   Rennie PlowmanMargaret Vyla Pint, FNP

## 2016-03-05 NOTE — Assessment & Plan Note (Signed)
Resolved. Only had using xanax one time during funeral.

## 2016-03-06 ENCOUNTER — Other Ambulatory Visit: Payer: Self-pay | Admitting: Physician Assistant

## 2016-03-06 NOTE — Telephone Encounter (Signed)
Med refill approved 

## 2016-04-06 ENCOUNTER — Other Ambulatory Visit: Payer: Self-pay | Admitting: Physician Assistant

## 2016-04-06 MED ORDER — BENZONATATE 200 MG PO CAPS: 200.0000 mg | ORAL_CAPSULE | Freq: Three times a day (TID) | ORAL | 0 refills | Status: DC | PRN

## 2016-04-06 NOTE — Progress Notes (Unsigned)
Pt has cough and congestion, ?if could have tessalon perls for cough, rx sent to [facility]

## 2016-04-07 ENCOUNTER — Other Ambulatory Visit: Payer: Self-pay | Admitting: Physician Assistant

## 2016-04-07 MED ORDER — QUINAPRIL HCL 10 MG PO TABS: 10.0000 mg | ORAL_TABLET | Freq: Every day | ORAL | 3 refills | Status: DC

## 2016-04-07 NOTE — Progress Notes (Unsigned)
Pt's bp running 140/100, will add 10mg  to accupril until sees her regular pcp

## 2016-04-20 ENCOUNTER — Other Ambulatory Visit: Payer: Self-pay

## 2016-05-06 ENCOUNTER — Other Ambulatory Visit: Payer: Self-pay | Admitting: Physician Assistant

## 2016-05-06 NOTE — Telephone Encounter (Signed)
Med refill for accupril approved, pt taking a 20 and 10mg  pill qd for htn

## 2016-05-18 ENCOUNTER — Other Ambulatory Visit: Payer: Self-pay | Admitting: Family

## 2016-05-18 ENCOUNTER — Telehealth: Payer: Self-pay | Admitting: *Deleted

## 2016-05-18 ENCOUNTER — Encounter: Payer: Self-pay | Admitting: Family

## 2016-05-18 DIAGNOSIS — R002 Palpitations: Secondary | ICD-10-CM

## 2016-05-18 NOTE — Telephone Encounter (Signed)
My Chart message : Good morning Ms Julia Brown. I have an appointment to get my labs done tomorrow to recheck my hga1c. I have been experiencing some palpitations that's been going on for about a week. I have no chest pain or shortness of breath. Greig RightSusan Fisher recently increased my Quinapril to 30mg  because my BP has been running high. The palpitations started shortly after that. It was suggested that my Potassium levels may be off. Since I have an appointment tomorrow for labs did you want to add any other labs since I am having the palpitation? I do have an appointment with you this coming Thursday at 7:30am.    Pt contact [phone]

## 2016-05-18 NOTE — Telephone Encounter (Signed)
Patient has changed appointment for 0700 wed.  Patient feels she does not need to come sooner or go to ED at this moment of time. No SOB and No Chest pain,  But if sx occurs or worsen she will go to ED.

## 2016-05-18 NOTE — Progress Notes (Unsigned)
Call pt- ( see mychart message she sent)  I have added on more labs for tomorrow  Please triage and ensure palpitations do not need earlier visit.   Palpitations in and of themselves are not a side effect of quinapril

## 2016-05-19 ENCOUNTER — Other Ambulatory Visit (INDEPENDENT_AMBULATORY_CARE_PROVIDER_SITE_OTHER): Payer: 59

## 2016-05-19 ENCOUNTER — Telehealth: Payer: Self-pay | Admitting: *Deleted

## 2016-05-19 DIAGNOSIS — R002 Palpitations: Secondary | ICD-10-CM | POA: Diagnosis not present

## 2016-05-19 DIAGNOSIS — E119 Type 2 diabetes mellitus without complications: Secondary | ICD-10-CM

## 2016-05-19 LAB — COMPREHENSIVE METABOLIC PANEL
ALT: 15 U/L (ref 0–35)
AST: 13 U/L (ref 0–37)
Albumin: 4.2 g/dL (ref 3.5–5.2)
Alkaline Phosphatase: 48 U/L (ref 39–117)
BUN: 11 mg/dL (ref 6–23)
CALCIUM: 9.7 mg/dL (ref 8.4–10.5)
CHLORIDE: 104 meq/L (ref 96–112)
CO2: 27 meq/L (ref 19–32)
CREATININE: 0.8 mg/dL (ref 0.40–1.20)
GFR: 99.77 mL/min (ref >60.00)
Glucose, Bld: 181 mg/dL — ABNORMAL HIGH (ref 70–99)
Potassium: 4.4 mEq/L (ref 3.5–5.1)
Sodium: 137 mEq/L (ref 135–145)
Total Bilirubin: 0.6 mg/dL (ref 0.2–1.2)
Total Protein: 7 g/dL (ref 6.0–8.3)

## 2016-05-19 LAB — MAGNESIUM: Magnesium: 2.2 mg/dL (ref 1.5–2.5)

## 2016-05-19 LAB — TSH: TSH: 1.34 u[IU]/mL (ref 0.35–4.50)

## 2016-05-19 NOTE — Telephone Encounter (Signed)
That's fine thank you

## 2016-05-19 NOTE — Telephone Encounter (Signed)
Came in this morning to recheck A1C. It was last checked in October. Please advise if you want me to release it. We drew the labs ordered yesterday as well.

## 2016-05-19 NOTE — Telephone Encounter (Signed)
A1C released & sent

## 2016-05-20 ENCOUNTER — Ambulatory Visit (INDEPENDENT_AMBULATORY_CARE_PROVIDER_SITE_OTHER): Payer: 59 | Admitting: Family

## 2016-05-20 ENCOUNTER — Encounter: Payer: Self-pay | Admitting: Family

## 2016-05-20 VITALS — BP 154/96 | HR 84 | Temp 98.5°F | Ht 70.0 in | Wt 245.6 lb

## 2016-05-20 DIAGNOSIS — I10 Essential (primary) hypertension: Secondary | ICD-10-CM | POA: Diagnosis not present

## 2016-05-20 DIAGNOSIS — R002 Palpitations: Secondary | ICD-10-CM | POA: Diagnosis not present

## 2016-05-20 MED ORDER — AMLODIPINE BESYLATE 2.5 MG PO TABS: 2.5000 mg | ORAL_TABLET | Freq: Every day | ORAL | 0 refills | Status: DC

## 2016-05-20 NOTE — Assessment & Plan Note (Addendum)
New. No CP, SOB associated with symptom. TSH and electrolytes normal yesterday. Considering caffeine use or quinlapril as potential causes. EKG shows NSR which is reassuring, No PVCs or acute strain. We agreed to see if changing medication resolves palpitations, if not, we will refer to cardiology for likely Holter.

## 2016-05-20 NOTE — Patient Instructions (Signed)
Go back to coreg twice per day and monitor BP. Goal is less than 140/90.  If persistently higher, you may then start the amlodipine.   Follow up in one month - sooner if BP is not controlled or palpitations persist.   If there is no improvement in your symptoms, or if there is any worsening of symptoms, or if you have any additional concerns, please return for re-evaluation; or, if we are closed, consider going to the Emergency Room for evaluation if symptoms urgent.

## 2016-05-20 NOTE — Progress Notes (Signed)
Pre visit review using our clinic review tool, if applicable. No additional management support is needed unless otherwise documented below in the visit note. 

## 2016-05-20 NOTE — Assessment & Plan Note (Signed)
Uncontrolled. We will go back to 20 mg quinapril as palpitations correlate with increase in dose; though not a typical AE of medication. Blood pressure still remains uncontrolled, advised patient restart Coreg at prescribed dose, of  twice daily, and if blood pressure is not better controlled, she understands to filled amlodipine 2.5 mg. Follow-up in one month.

## 2016-05-20 NOTE — Progress Notes (Signed)
Subjective:    Patient ID: Julia Brown, female    DOB: 04/22/1970, 46 y.o.   MRN: [id number]  CC: Julia Brown is a 46 y.o. female who presents today for follow up.   HPI: HTN- after increased quinlapril from 20 to 30mg  from occupational health started to have heart palpitations. Intermittent 'throughout the day.'. Self limited. BP has been running 160/100.  Drinks columbian coffee. Only taking coreg QD ( prescribed BID).   No associated CP,exertional chest pain or pressure, numbness or tingling radiating to left arm or jaw, dizziness, frequent headaches, changes in vision, or shortness of breath.      HISTORY:  Past Medical History:  Diagnosis Date  . Chicken pox   . Diabetes mellitus   . GERD (gastroesophageal reflux disease)   . Hyperlipidemia   . Hypertension   . UTI (lower urinary tract infection)    Past Surgical History:  Procedure Laterality Date  . ABLATION  2007   excessive bleeding  . CESAREAN SECTION  1994  . CHOLECYSTECTOMY  1993  . ORIF FEMUR FRACTURE Right    [place]  . TUBAL LIGATION    . uterine ablation     Family History  Problem Relation Age of Onset  . Heart disease Father   . Hyperlipidemia Father   . Diabetes Father   . Hyperlipidemia Mother   . Hypertension Mother   . Schizophrenia Mother   . Depression Mother   . Diabetes Mother   . Heart disease Other   . Cancer Maternal Aunt     breast  . Diabetes Maternal Grandmother   . Diabetes Maternal Grandfather     Allergies: Statins and Penicillins Current Outpatient Prescriptions on File Prior to Visit  Medication Sig Dispense Refill  . ALPRAZolam (XANAX) 0.25 MG tablet Take 1 tablet (0.25 mg total) by mouth 2 (two) times daily as needed for anxiety. 20 tablet 0  . benzonatate (TESSALON) 200 MG capsule Take 1 capsule (200 mg total) by mouth 3 (three) times daily as needed for cough. 30 capsule 0  . carvedilol (COREG) 3.125 MG tablet TAKE 1 TABLET BY MOUTH TWICE DAILY WITH A MEAL  180 tablet 6  . Insulin Glargine (LANTUS SOLOSTAR) 100 UNIT/ML Solostar Pen Inject 40 Units into the skin daily. 5 pen 12  . INVOKANA 300 MG TABS tablet TAKE 1 TABLET BY MOUTH DAILY BEFORE BREAKFAST 90 tablet 3  . LANTUS SOLOSTAR 100 UNIT/ML Solostar Pen INJECT 55 UNITS INTO THE SKIN DAILY 15 mL 12  . Liraglutide (VICTOZA) 18 MG/3ML SOPN INJECT 0.3 MLS (1.8 MG TOTAL) INTO THE SKIN DAILY. 9 mL 11  . metFORMIN (GLUCOPHAGE) 500 MG tablet Take 500 mg in the morning and 1000 mg at night time (Patient taking differently: 500 mg daily with breakfast. ) 90 tablet 3  . omeprazole (PRILOSEC) 40 MG capsule TAKE ONE CAPSULE BY MOUTH DAILY 30 capsule 6  . quinapril (ACCUPRIL) 20 MG tablet TAKE 1 TABLET BY MOUTH AT BEDTIME. 30 tablet 6  . UNIFINE PENTIPS 31G X 5 MM MISC USE AS DIRECTED 200 each 6  . VITAMIN D, ERGOCALCIFEROL, PO Take 50,000 Int'l Units by mouth.      No current facility-administered medications on file prior to visit.     Social History  Substance Use Topics  . Smoking status: Current Every Day Smoker    Packs/day: 0.50    Years: 10.00    Types: Cigarettes  . Smokeless tobacco: Never Used  Comment: smoked < 1 pack a day  . Alcohol use 0.0 oz/week     Comment: weekend 3-4drinks    Review of Systems  Constitutional: Negative for chills and fever.  Eyes: Negative for visual disturbance.  Respiratory: Negative for cough.   Cardiovascular: Positive for palpitations. Negative for chest pain.  Gastrointestinal: Negative for nausea and vomiting.  Neurological: Negative for headaches.      Objective:    BP (!) 154/96   Pulse 84   Temp 98.5 F (36.9 C) (Oral)   Ht 5\' 10"  (1.778 m)   Wt 245 lb 9.6 oz (111.4 kg)   SpO2 98%   BMI 35.24 kg/m  BP Readings from Last 3 Encounters:  05/20/16 (!) 154/96  03/05/16 130/85  01/14/16 (!) 150/100   Wt Readings from Last 3 Encounters:  05/20/16 245 lb 9.6 oz (111.4 kg)  03/05/16 246 lb 3.2 oz (111.7 kg)  01/14/16 239 lb 3.2 oz  (108.5 kg)    Physical Exam  Constitutional: She appears well-developed and well-nourished.  Eyes: Conjunctivae are normal.  Cardiovascular: Normal rate, regular rhythm, normal heart sounds and normal pulses.   Pulmonary/Chest: Effort normal and breath sounds normal. She has no wheezes. She has no rhonchi. She has no rales.  Neurological: She is alert.  Skin: Skin is warm and dry.  Psychiatric: She has a normal mood and affect. Her speech is normal and behavior is normal. Thought content normal.  Vitals reviewed.      Assessment & Plan:   Problem List Items Addressed This Visit      Cardiovascular and Mediastinum   Essential hypertension - Primary (Chronic)    Uncontrolled. We will go back to 20 mg quinapril as palpitations correlate with increase in dose; though not a typical AE of medication. Blood pressure still remains uncontrolled, advised patient restart Coreg at prescribed dose, of  twice daily, and if blood pressure is not better controlled, she understands to filled amlodipine 2.5 mg. Follow-up in one month.      Relevant Medications   amLODipine (NORVASC) 2.5 MG tablet     Other   Palpitations    New. No CP, SOB associated with symptom. TSH and electrolytes normal yesterday. Considering caffeine use or quinlapril as potential causes. EKG shows NSR which is reassuring, No PVCs or acute strain. We agreed to see if changing medication resolves palpitations, if not, we will refer to cardiology for likely Holter.       Relevant Orders   EKG 12-Lead (Completed)       I am having Julia Brown start on amLODipine. I am also having her maintain her metFORMIN, (VITAMIN D, ERGOCALCIFEROL, PO), carvedilol, UNIFINE PENTIPS, Insulin Glargine, liraglutide, ALPRAZolam, omeprazole, INVOKANA, LANTUS SOLOSTAR, benzonatate, and quinapril.   Meds ordered this encounter  Medications  . amLODipine (NORVASC) 2.5 MG tablet    Sig: Take 1 tablet (2.5 mg total) by mouth daily.    Dispense:   90 tablet    Refill:  0    Order Specific Question:   Supervising Provider    Answer:   Sherlene Shams [[id number]]    Return precautions given.   Risks, benefits, and alternatives of the medications and treatment plan prescribed today were discussed, and patient expressed understanding.   Education regarding symptom management and diagnosis given to patient on AVS.  Continue to follow with Rennie Plowman, FNP for routine health maintenance.   Julia Crumb and I agreed with plan.   Rennie Plowman, FNP

## 2016-05-21 ENCOUNTER — Encounter: Payer: Self-pay | Admitting: Family

## 2016-05-21 ENCOUNTER — Ambulatory Visit: Payer: Self-pay | Admitting: Family

## 2016-05-26 ENCOUNTER — Other Ambulatory Visit: Payer: Self-pay | Admitting: Radiology

## 2016-05-26 ENCOUNTER — Telehealth: Payer: Self-pay | Admitting: Radiology

## 2016-05-26 DIAGNOSIS — E119 Type 2 diabetes mellitus without complications: Secondary | ICD-10-CM

## 2016-05-26 NOTE — Telephone Encounter (Signed)
Call pt  Needs to repeat a1c- so very sorry  Pended lab and she make an appt

## 2016-05-26 NOTE — Telephone Encounter (Signed)
Called [facility] regarding pt unresulted A1C. Specimen sent to lab and test was never ran according [facility]. Pt needs to comeback for redraw for A1C.

## 2016-05-26 NOTE — Telephone Encounter (Signed)
LMTCB

## 2016-05-26 NOTE — Addendum Note (Signed)
Addended by: Penne LashWIGGINS, Aviana Shevlin N on: 05/26/2016 10:34 AM   Modules accepted: Orders

## 2016-05-27 NOTE — Telephone Encounter (Signed)
LMTCB

## 2016-05-28 NOTE — Telephone Encounter (Signed)
appt has been scheduled for 06/02/2016 @ 11:15am. Pt aware of appt date and time.

## 2016-06-02 ENCOUNTER — Other Ambulatory Visit (INDEPENDENT_AMBULATORY_CARE_PROVIDER_SITE_OTHER): Payer: 59

## 2016-06-02 ENCOUNTER — Encounter: Payer: Self-pay | Admitting: Family

## 2016-06-02 DIAGNOSIS — E119 Type 2 diabetes mellitus without complications: Secondary | ICD-10-CM

## 2016-06-02 LAB — HEMOGLOBIN A1C: Hgb A1c MFr Bld: 7 % — ABNORMAL HIGH (ref 4.6–6.5)

## 2016-06-03 ENCOUNTER — Other Ambulatory Visit: Payer: Self-pay | Admitting: Family

## 2016-06-03 DIAGNOSIS — R002 Palpitations: Secondary | ICD-10-CM

## 2016-06-10 ENCOUNTER — Ambulatory Visit (INDEPENDENT_AMBULATORY_CARE_PROVIDER_SITE_OTHER): Payer: 59 | Admitting: Cardiology

## 2016-06-10 ENCOUNTER — Encounter: Payer: Self-pay | Admitting: Cardiology

## 2016-06-10 VITALS — BP 136/80 | HR 94 | Ht 70.0 in | Wt 242.5 lb

## 2016-06-10 DIAGNOSIS — R002 Palpitations: Secondary | ICD-10-CM | POA: Diagnosis not present

## 2016-06-10 DIAGNOSIS — I10 Essential (primary) hypertension: Secondary | ICD-10-CM

## 2016-06-10 DIAGNOSIS — R0602 Shortness of breath: Secondary | ICD-10-CM

## 2016-06-10 MED ORDER — ASPIRIN EC 81 MG PO TBEC: 81.0000 mg | DELAYED_RELEASE_TABLET | Freq: Every day | ORAL | 3 refills | Status: DC

## 2016-06-10 NOTE — Progress Notes (Signed)
Cardiology Office Note   Date:  06/10/2016   ID:  Julia Brown, DOB Mar 02, 1971, MRN [id number]  Referring Doctor:  Rennie Plowman, FNP   Cardiologist:   Almond Lint, MD   Reason for consultation:  Chief Complaint  Patient presents with  . other     referred by Dr.ARNETT for Heart Palpitations. Pt denies cp, sob; wants to be established to keep an eye on things. Reviewed meds with pt verbally.      History of Present Illness: Julia Brown is a 46 y.o. female who presents for Palpitations. Has been going on for a few months now. First noted when her quinapril dose was increased from 20-40. Felt fluttering in her chest making her cough, bothersome, moderate in intensity, on and off throughout the day, lasting a few minutes at a time, randomly occurring. Eventually, the dose was decreased back to 20 daily and another medication was started. However, the fluttering persisted.  She has no chest pain or shortness of breath but admits to not regularly exercising. She wants to be checked out before she starts a routine exercise plan.   ROS:  Please see the history of present illness. Aside from mentioned under HPI, all other systems are reviewed and negative.     Past Medical History:  Diagnosis Date  . Chicken pox   . Diabetes mellitus   . GERD (gastroesophageal reflux disease)   . Hyperlipidemia   . Hypertension   . UTI (lower urinary tract infection)     Past Surgical History:  Procedure Laterality Date  . ABLATION  2007   excessive bleeding  . CESAREAN SECTION  1994  . CHOLECYSTECTOMY  1993  . ORIF FEMUR FRACTURE Right    [place]  . TUBAL LIGATION    . uterine ablation       reports that she has been smoking Cigarettes.  She has a 5.00 pack-year smoking history. She has never used smokeless tobacco. She reports that she drinks alcohol. She reports that she does not use drugs.   family history includes Cancer in her maternal aunt; Depression in her mother;  Diabetes in her father, maternal grandfather, maternal grandmother, and mother; Heart disease in her father and other; Hyperlipidemia in her father and mother; Hypertension in her mother; Schizophrenia in her mother.   Outpatient Medications Prior to Visit  Medication Sig Dispense Refill  . ALPRAZolam (XANAX) 0.25 MG tablet Take 1 tablet (0.25 mg total) by mouth 2 (two) times daily as needed for anxiety. 20 tablet 0  . amLODipine (NORVASC) 2.5 MG tablet Take 1 tablet (2.5 mg total) by mouth daily. 90 tablet 0  . benzonatate (TESSALON) 200 MG capsule Take 1 capsule (200 mg total) by mouth 3 (three) times daily as needed for cough. 30 capsule 0  . carvedilol (COREG) 3.125 MG tablet TAKE 1 TABLET BY MOUTH TWICE DAILY WITH A MEAL 180 tablet 6  . Insulin Glargine (LANTUS SOLOSTAR) 100 UNIT/ML Solostar Pen Inject 40 Units into the skin daily. 5 pen 12  . INVOKANA 300 MG TABS tablet TAKE 1 TABLET BY MOUTH DAILY BEFORE BREAKFAST 90 tablet 3  . Liraglutide (VICTOZA) 18 MG/3ML SOPN INJECT 0.3 MLS (1.8 MG TOTAL) INTO THE SKIN DAILY. 9 mL 11  . metFORMIN (GLUCOPHAGE) 500 MG tablet Take 500 mg in the morning and 1000 mg at night time (Patient taking differently: 500 mg daily with breakfast. ) 90 tablet 3  . omeprazole (PRILOSEC) 40 MG capsule TAKE ONE CAPSULE  BY MOUTH DAILY 30 capsule 6  . quinapril (ACCUPRIL) 20 MG tablet TAKE 1 TABLET BY MOUTH AT BEDTIME. 30 tablet 6  . UNIFINE PENTIPS 31G X 5 MM MISC USE AS DIRECTED 200 each 6  . VITAMIN D, ERGOCALCIFEROL, PO Take 50,000 Int'l Units by mouth.     Marland Kitchen LANTUS SOLOSTAR 100 UNIT/ML Solostar Pen INJECT 55 UNITS INTO THE SKIN DAILY (Patient not taking: Reported on 06/10/2016) 15 mL 12   No facility-administered medications prior to visit.      Allergies: Statins and Penicillins    PHYSICAL EXAM: VS:  BP 136/80 (BP Location: Right Arm, Patient Position: Sitting, Cuff Size: Normal)   Pulse 94   Ht 5\' 10"  (1.778 m)   Wt 242 lb 8 oz (110 kg)   BMI 34.80 kg/m   , Body mass index is 34.8 kg/m. Wt Readings from Last 3 Encounters:  06/10/16 242 lb 8 oz (110 kg)  05/20/16 245 lb 9.6 oz (111.4 kg)  03/05/16 246 lb 3.2 oz (111.7 kg)    GENERAL:  well developed, well nourished, obese, not in acute distress HEENT: normocephalic, pink conjunctivae, anicteric sclerae, no xanthelasma, normal dentition, oropharynx clear NECK:  no neck vein engorgement, JVP normal, no hepatojugular reflux, carotid upstroke brisk and symmetric, no bruit, no thyromegaly, no lymphadenopathy LUNGS:  good respiratory effort, clear to auscultation bilaterally CV:  PMI not displaced, no thrills, no lifts, S1 and S2 within normal limits, no palpable S3 or S4, no murmurs, no rubs, no gallops ABD:  Soft, nontender, nondistended, normoactive bowel sounds, no abdominal aortic bruit, no hepatomegaly, no splenomegaly MS: nontender back, no kyphosis, no scoliosis, no joint deformities EXT:  2+ DP/PT pulses, no edema, no varicosities, no cyanosis, no clubbing SKIN: warm, nondiaphoretic, normal turgor, no ulcers NEUROPSYCH: alert, oriented to person, place, and time, sensory/motor grossly intact, normal mood, appropriate affect  Recent Labs: 05/19/2016: ALT 15; BUN 11; Creatinine, Ser 0.80; Magnesium 2.2; Potassium 4.4; Sodium 137; TSH 1.34   Lipid Panel    Component Value Date/Time   CHOL 212 (H) 01/14/2016 0818   TRIG 115.0 01/14/2016 0818   HDL 55.00 01/14/2016 0818   CHOLHDL 4 01/14/2016 0818   VLDL 23.0 01/14/2016 0818   LDLCALC 134 (H) 01/14/2016 0818     Other studies Reviewed:  EKG:  The ekg from 06/10/2016 was personally reviewed by me and it revealed sinus rhythm, 94 BPM.  Additional studies/ records that were reviewed personally reviewed by me today include: None available   ASSESSMENT AND PLAN: Palpitations Recommend objective evaluation with long-term/3 day monitor. Symptoms are not daily.  Poor functional capacity, shortness of breath, would like to start  exercising. Risk factors include hypertension, diabetes, obesity Recommend stress echocardiogram  HTN BP is well controlled. Continue monitoring BP. Continue current medical therapy and lifestyle changes. PCP following this.  Due to diabetes, ideal LDL goal is less than 70.    Current medicines are reviewed at length with the patient today.  The patient does not have concerns regarding medicines.  Labs/ tests ordered today include:  Orders Placed This Encounter  Procedures  . LONG TERM MONITOR (3-14 DAYS)  . EKG 12-Lead  . ECHOCARDIOGRAM STRESS TEST    I had a lengthy and detailed discussion with the patient regarding diagnoses, prognosis, diagnostic options.  I counseled the patient on importance of lifestyle modification including heart healthy diet, regular physical activity .   Disposition:   FU with Cardiology after tests   Thank you for  this consultation. We will forwarding this consultation to referring physician.   I spent at least 60 minutes with the patient today and more than 50% of the time was spent counseling the patient and coordinating care.    Signed, Almond LintAileen Nikira Kushnir, MD  06/10/2016 4:08 PM    [facility]  This note was generated in part with voice recognition software and I apologize for any typographical errors that were not detected and corrected.

## 2016-06-10 NOTE — Patient Instructions (Signed)
Medication Instructions:  1. START Aspirin 81 mg Once daily  Testing/Procedures:  Your physician has recommended that you wear a Zio monitor. Zio monitors are medical devices that record the heart's electrical activity. Doctors most often us these monitors to diagnose arrhythmias. Arrhythmias are problems with the speed or rhythm of the heartbeat. The monitor is a small, portable device. You can wear one while you do your normal daily activities. This is usually used to diagnose what is causing palpitations/syncope (passing out).   Your physician has requested that you have a stress echocardiogram. For further information please visit [email or web address]. Please follow instruction sheet as given.   Do not drink or eat foods with caffeine for 24 hours before the test. (Chocolate, coffee, tea, or energy drinks)  If you use an inhaler, bring it with you to the test.  Do not smoke for 4 hours before the test.  Wear comfortable shoes and clothing.  Follow-Up: Your physician recommends that you schedule a follow-up appointment as needed. We will call you with results and if needed schedule follow up at that time.   It was a pleasure seeing you today here in the office. Please do not hesitate to give us a call back if you have any further questions. [phone]  Ambrose CellarPamela A. RN, BSN

## 2016-06-11 ENCOUNTER — Ambulatory Visit (INDEPENDENT_AMBULATORY_CARE_PROVIDER_SITE_OTHER): Payer: 59

## 2016-06-11 DIAGNOSIS — R002 Palpitations: Secondary | ICD-10-CM

## 2016-06-11 DIAGNOSIS — R0602 Shortness of breath: Secondary | ICD-10-CM | POA: Diagnosis not present

## 2016-06-11 NOTE — Progress Notes (Unsigned)
Zio monitor applied [id number]

## 2016-06-29 ENCOUNTER — Other Ambulatory Visit: Payer: Self-pay | Admitting: Physician Assistant

## 2016-07-21 ENCOUNTER — Telehealth: Payer: Self-pay | Admitting: Cardiology

## 2016-07-21 ENCOUNTER — Other Ambulatory Visit: Payer: 59

## 2016-07-21 NOTE — Telephone Encounter (Signed)
Pt scheduled for stress echo today. Called pt to inquire if she held her daily coreg this morning.  Pt states she did not realize test was today and she has taken coreg as she was unaware to hold this. She requests to reschedule as she can not get off of work today. She is busy now but will call back to reschedule stress echo.

## 2016-08-06 DIAGNOSIS — H5213 Myopia, bilateral: Secondary | ICD-10-CM | POA: Diagnosis not present

## 2016-08-06 DIAGNOSIS — E113293 Type 2 diabetes mellitus with mild nonproliferative diabetic retinopathy without macular edema, bilateral: Secondary | ICD-10-CM | POA: Diagnosis not present

## 2016-08-06 DIAGNOSIS — H52223 Regular astigmatism, bilateral: Secondary | ICD-10-CM | POA: Diagnosis not present

## 2016-08-14 ENCOUNTER — Encounter: Payer: Self-pay | Admitting: Cardiology

## 2016-08-28 ENCOUNTER — Other Ambulatory Visit: Payer: Self-pay | Admitting: Physician Assistant

## 2016-08-28 NOTE — Telephone Encounter (Signed)
Received from [facility]

## 2016-08-31 NOTE — Telephone Encounter (Signed)
spreadsheet

## 2016-10-13 ENCOUNTER — Other Ambulatory Visit: Payer: Self-pay | Admitting: Physician Assistant

## 2016-10-13 NOTE — Telephone Encounter (Signed)
Med refill for mobic approved 

## 2016-11-13 NOTE — Telephone Encounter (Signed)
Error

## 2016-11-24 ENCOUNTER — Other Ambulatory Visit: Payer: Self-pay | Admitting: Physician Assistant

## 2016-11-24 NOTE — Telephone Encounter (Signed)
Med refill approved 

## 2016-12-11 ENCOUNTER — Ambulatory Visit
Admission: RE | Admit: 2016-12-11 | Discharge: 2016-12-11 | Disposition: A | Discharge status: Home / Self Care | Payer: 59 | Source: Ambulatory Visit | Attending: Physician Assistant | Admitting: Physician Assistant

## 2016-12-11 ENCOUNTER — Ambulatory Visit: Payer: Self-pay | Admitting: Physician Assistant

## 2016-12-11 DIAGNOSIS — M25552 Pain in left hip: Secondary | ICD-10-CM | POA: Diagnosis not present

## 2016-12-11 MED ORDER — DICLOFENAC SODIUM 75 MG PO TBEC: 75.0000 mg | DELAYED_RELEASE_TABLET | Freq: Two times a day (BID) | ORAL | 3 refills | Status: DC

## 2016-12-11 NOTE — Progress Notes (Addendum)
S: c/o left hip pain, becoming more frequent, is limping today and mobic is not helping at all, hx of surgery as a child on r hip, no fever/chills  O: vitals wnl, nad, pt walks with limp, full rom of hip, n/v intact  A: left hip pain  P: xray left hip Xray was negative, sent in rx for diclofenac, pt to stop mobic

## 2016-12-11 NOTE — Addendum Note (Signed)
Addended by: Faythe Ghee on: 12/11/2016 03:44 PM   Modules accepted: Orders

## 2016-12-25 ENCOUNTER — Encounter: Payer: Self-pay | Admitting: Family

## 2016-12-25 DIAGNOSIS — Z0000 Encounter for general adult medical examination without abnormal findings: Secondary | ICD-10-CM

## 2016-12-29 NOTE — Telephone Encounter (Signed)
Orders have been placed for patient to have labs completed, per provider request.

## 2017-01-05 ENCOUNTER — Ambulatory Visit (INDEPENDENT_AMBULATORY_CARE_PROVIDER_SITE_OTHER): Payer: 59 | Admitting: Family

## 2017-01-05 ENCOUNTER — Encounter: Payer: Self-pay | Admitting: Family

## 2017-01-05 VITALS — BP 140/90 | HR 100 | Temp 98.4°F | Ht 70.0 in | Wt 236.2 lb

## 2017-01-05 DIAGNOSIS — Z0000 Encounter for general adult medical examination without abnormal findings: Secondary | ICD-10-CM | POA: Diagnosis not present

## 2017-01-05 DIAGNOSIS — I10 Essential (primary) hypertension: Secondary | ICD-10-CM | POA: Diagnosis not present

## 2017-01-05 DIAGNOSIS — E119 Type 2 diabetes mellitus without complications: Secondary | ICD-10-CM | POA: Diagnosis not present

## 2017-01-05 DIAGNOSIS — K219 Gastro-esophageal reflux disease without esophagitis: Secondary | ICD-10-CM

## 2017-01-05 DIAGNOSIS — M62838 Other muscle spasm: Secondary | ICD-10-CM | POA: Diagnosis not present

## 2017-01-05 LAB — CBC WITH DIFFERENTIAL/PLATELET
BASOS PCT: 0.7 % (ref 0.0–3.0)
Basophils Absolute: 0.1 10*3/uL (ref 0.0–0.1)
EOS ABS: 0.2 10*3/uL (ref 0.0–0.7)
EOS PCT: 1.9 % (ref 0.0–5.0)
HEMATOCRIT: 42.7 % (ref 36.0–46.0)
HEMOGLOBIN: 14.4 g/dL (ref 12.0–15.0)
LYMPHS PCT: 27 % (ref 12.0–46.0)
Lymphs Abs: 2.5 10*3/uL (ref 0.7–4.0)
MCHC: 33.7 g/dL (ref 30.0–36.0)
MCV: 90.5 fl (ref 78.0–100.0)
Monocytes Absolute: 0.6 10*3/uL (ref 0.1–1.0)
Monocytes Relative: 6.8 % (ref 3.0–12.0)
Neutro Abs: 5.8 10*3/uL (ref 1.4–7.7)
Neutrophils Relative %: 63.6 % (ref 43.0–77.0)
Platelets: 300 10*3/uL (ref 150.0–400.0)
RBC: 4.71 Mil/uL (ref 3.87–5.11)
RDW: 12.5 % (ref 11.5–15.5)
WBC: 9.1 10*3/uL (ref 4.0–10.5)

## 2017-01-05 LAB — VITAMIN D 25 HYDROXY (VIT D DEFICIENCY, FRACTURES): VITD: 52.06 ng/mL (ref 30.00–100.00)

## 2017-01-05 LAB — HEMOGLOBIN A1C: HEMOGLOBIN A1C: 7.2 % — AB (ref 4.6–6.5)

## 2017-01-05 LAB — LIPID PANEL
Cholesterol: 250 mg/dL — ABNORMAL HIGH (ref 0–200)
HDL: 50.6 mg/dL (ref >39.00)
NonHDL: 199.15
Total CHOL/HDL Ratio: 5
Triglycerides: 244 mg/dL — ABNORMAL HIGH (ref 0.0–149.0)
VLDL: 48.8 mg/dL — AB (ref 0.0–40.0)

## 2017-01-05 LAB — COMPREHENSIVE METABOLIC PANEL
ALBUMIN: 4.3 g/dL (ref 3.5–5.2)
ALK PHOS: 51 U/L (ref 39–117)
ALT: 25 U/L (ref 0–35)
AST: 20 U/L (ref 0–37)
BILIRUBIN TOTAL: 0.8 mg/dL (ref 0.2–1.2)
BUN: 17 mg/dL (ref 6–23)
CO2: 26 mEq/L (ref 19–32)
CREATININE: 0.8 mg/dL (ref 0.40–1.20)
Calcium: 9.9 mg/dL (ref 8.4–10.5)
Chloride: 101 mEq/L (ref 96–112)
GFR: 99.48 mL/min (ref >60.00)
Glucose, Bld: 142 mg/dL — ABNORMAL HIGH (ref 70–99)
Potassium: 4.7 mEq/L (ref 3.5–5.1)
SODIUM: 137 meq/L (ref 135–145)
TOTAL PROTEIN: 7.1 g/dL (ref 6.0–8.3)

## 2017-01-05 LAB — LIPASE: LIPASE: 30 U/L (ref 11.0–59.0)

## 2017-01-05 LAB — LDL CHOLESTEROL, DIRECT: Direct LDL: 145 mg/dL

## 2017-01-05 LAB — TSH: TSH: 3.87 u[IU]/mL (ref 0.35–4.50)

## 2017-01-05 MED ORDER — INSULIN GLARGINE 100 UNIT/ML SOLOSTAR PEN: 40.0000 [IU] | PEN_INJECTOR | Freq: Every day | SUBCUTANEOUS | 12 refills | Status: DC

## 2017-01-05 MED ORDER — OMEPRAZOLE 40 MG PO CPDR: 40.0000 mg | DELAYED_RELEASE_CAPSULE | Freq: Every day | ORAL | 6 refills | Status: DC

## 2017-01-05 MED ORDER — AMLODIPINE BESYLATE 5 MG PO TABS: 5.0000 mg | ORAL_TABLET | Freq: Every day | ORAL | 3 refills | Status: DC

## 2017-01-05 MED ORDER — METFORMIN HCL 500 MG PO TABS: 500.0000 mg | ORAL_TABLET | Freq: Every day | ORAL | 1 refills | Status: DC

## 2017-01-05 MED ORDER — CARVEDILOL 3.125 MG PO TABS: ORAL_TABLET | ORAL | 6 refills | Status: DC

## 2017-01-05 MED ORDER — LIRAGLUTIDE 18 MG/3ML ~~LOC~~ SOPN: PEN_INJECTOR | SUBCUTANEOUS | 11 refills | Status: DC

## 2017-01-05 MED ORDER — INSULIN PEN NEEDLE 31G X 5 MM MISC: 6 refills | Status: DC

## 2017-01-05 MED ORDER — CANAGLIFLOZIN 300 MG PO TABS: ORAL_TABLET | ORAL | 3 refills | Status: DC

## 2017-01-05 MED ORDER — QUINAPRIL HCL 20 MG PO TABS: 20.0000 mg | ORAL_TABLET | Freq: Every day | ORAL | 6 refills | Status: DC

## 2017-01-05 NOTE — Progress Notes (Signed)
Subjective:    Patient ID: Julia Brown, female    DOB: 02-03-1971, 46 y.o.   MRN: [id number]  CC: Julia Brown is a 46 y.o. female who presents today for follow up.   HPI: HTN- compliant with medication. Denies exertional chest pain or pressure, numbness or tingling radiating to left arm or jaw, palpitations, dizziness, frequent headaches, changes in vision, or shortness of breath.  Averages 140/90 to 138/69.   GERD- every day takes prilosec. If misses, symptoms return.   Left side flank wall pain- for 3 years, unchanged. No rash, or pain with eating Pain presents with when turns or twists such as when exercise class. Self limiting and resolves in couple of minutes.  No constipation, nausea, vomiting, abdominal distention.   H/o cholecystectomy  smoker   Ingal- 05/2016; Holter monitor no significant arrhythmia.  HISTORY:  Past Medical History:  Diagnosis Date  . Chicken pox   . Diabetes mellitus   . GERD (gastroesophageal reflux disease)   . Hyperlipidemia   . Hypertension   . UTI (lower urinary tract infection)    Past Surgical History:  Procedure Laterality Date  . ABLATION  2007   excessive bleeding  . CESAREAN SECTION  1994  . CHOLECYSTECTOMY  1993  . ORIF FEMUR FRACTURE Right    [place]  . TUBAL LIGATION    . uterine ablation     Family History  Problem Relation Age of Onset  . Heart disease Father   . Hyperlipidemia Father   . Diabetes Father   . Hyperlipidemia Mother   . Hypertension Mother   . Schizophrenia Mother   . Depression Mother   . Diabetes Mother   . Heart disease Other   . Cancer Maternal Aunt        breast  . Diabetes Maternal Grandmother   . Diabetes Maternal Grandfather     Allergies: Statins and Penicillins Current Outpatient Prescriptions on File Prior to Visit  Medication Sig Dispense Refill  . aspirin EC 81 MG tablet Take 1 tablet (81 mg total) by mouth daily. 90 tablet 3  . diclofenac (VOLTAREN) 75 MG EC tablet Take 1  tablet (75 mg total) by mouth 2 (two) times daily. 30 tablet 3  . VITAMIN D, ERGOCALCIFEROL, PO Take 50,000 Int'l Units by mouth.      No current facility-administered medications on file prior to visit.     Social History  Substance Use Topics  . Smoking status: Current Every Day Smoker    Packs/day: 0.50    Years: 10.00    Types: Cigarettes  . Smokeless tobacco: Never Used     Comment: smoked < 1 pack a day  . Alcohol use 0.0 oz/week     Comment: weekend 3-4drinks    Review of Systems  Constitutional: Negative for chills and fever.  Respiratory: Negative for cough.   Cardiovascular: Negative for chest pain and palpitations.  Gastrointestinal: Negative for abdominal distention, abdominal pain, anal bleeding, blood in stool, constipation, diarrhea, nausea and vomiting.  Genitourinary: Negative for dysuria.      Objective:    BP 140/90   Pulse 100   Temp 98.4 F (36.9 C) (Oral)   Ht  (1.778 m)   Wt 236 lb 3.2 oz (107.1 kg)   SpO2 96%   BMI 33.89 kg/m  BP Readings from Last 3 Encounters:  01/05/17 140/90  06/10/16 136/80  05/20/16 (!) 154/96   Wt Readings from Last 3 Encounters:  01/05/17 236  lb 3.2 oz (107.1 kg)  06/10/16 242 lb 8 oz (110 kg)  05/20/16 245 lb 9.6 oz (111.4 kg)    Physical Exam  Constitutional: She appears well-developed and well-nourished.  Eyes: Conjunctivae are normal.  Cardiovascular: Normal rate, regular rhythm, normal heart sounds and normal pulses.   Pulmonary/Chest: Breath sounds normal. She has no wheezes. She has no rhonchi. She has no rales.  Abdominal: Soft. Normal appearance and bowel sounds are normal. She exhibits no distension, no fluid wave, no ascites and no mass. There is no tenderness. There is no rigidity, no rebound, no guarding and no CVA tenderness.  Neurological: She is alert.  Skin: Skin is warm and dry.  Psychiatric: She has a normal mood and affect. Her speech is normal and behavior is normal. Thought content  normal.  Vitals reviewed.      Assessment & Plan:   Problem List Items Addressed This Visit      Cardiovascular and Mediastinum   Essential hypertension - Primary (Chronic)    Elevated. Will increase amlodipine. Patient will keep log.       Relevant Medications   amLODipine (NORVASC) 5 MG tablet   carvedilol (COREG) 3.125 MG tablet   quinapril (ACCUPRIL) 20 MG tablet     Digestive   GERD (gastroesophageal reflux disease)    Controlled on ppi. Pending egd, particularly in context of smoking history.       Relevant Medications   omeprazole (PRILOSEC) 40 MG capsule   Other Relevant Orders   Ambulatory referral to Gastroenterology     Endocrine   Diabetes type 2, controlled (HCC) (Chronic)    Presume stable. Pending a1c      Relevant Medications   Insulin Glargine (LANTUS SOLOSTAR) 100 UNIT/ML Solostar Pen   canagliflozin (INVOKANA) 300 MG TABS tablet   liraglutide (VICTOZA) 18 MG/3ML SOPN   metFORMIN (GLUCOPHAGE) 500 MG tablet   quinapril (ACCUPRIL) 20 MG tablet     Other   Muscle spasm    Unchanged. Symptom is consistent with muscle spasm. Not related to eating. reassured by benign abdominal exam. Pending Abdominal ultrasound. Advised patient adequate hydration, stretching prior to exercise. She will let me know symptom worsens.      Relevant Orders   US Abdomen Complete   Lipase    Other Visit Diagnoses    Encounter for annual physical exam           I have discontinued Julia Brown's ALPRAZolam, benzonatate, amLODipine, fluticasone, fluconazole, and ondansetron. I have changed her INVOKANA to canagliflozin and UNIFINE PENTIPS to Insulin Pen Needle. I have also changed her metFORMIN, omeprazole, and quinapril. Additionally, I am having her start on amLODipine. Lastly, I am having her maintain her (VITAMIN D, ERGOCALCIFEROL, PO), aspirin EC, diclofenac, carvedilol, Insulin Glargine, and liraglutide.   Meds ordered this encounter  Medications  . amLODipine  (NORVASC) 5 MG tablet    Sig: Take 1 tablet (5 mg total) by mouth daily.    Dispense:  90 tablet    Refill:  3    Order Specific Question:   Supervising Provider    Answer:   Duncan Dull L [[id number]]  . carvedilol (COREG) 3.125 MG tablet    Sig: TAKE 1 TABLET BY MOUTH TWICE DAILY WITH A MEAL    Dispense:  180 tablet    Refill:  6  . Insulin Glargine (LANTUS SOLOSTAR) 100 UNIT/ML Solostar Pen    Sig: Inject 40 Units into the skin daily.    Dispense:  5  pen    Refill:  12  . canagliflozin (INVOKANA) 300 MG TABS tablet    Sig: TAKE 1 TABLET BY MOUTH DAILY BEFORE BREAKFAST    Dispense:  90 tablet    Refill:  3  . liraglutide (VICTOZA) 18 MG/3ML SOPN    Sig: INJECT 0.3 MLS (1.8 MG TOTAL) INTO THE SKIN DAILY.    Dispense:  9 mL    Refill:  11  . metFORMIN (GLUCOPHAGE) 500 MG tablet    Sig: Take 1 tablet (500 mg total) by mouth daily with breakfast.    Dispense:  270 tablet    Refill:  1  . omeprazole (PRILOSEC) 40 MG capsule    Sig: Take 1 capsule (40 mg total) by mouth daily.    Dispense:  30 capsule    Refill:  6  . quinapril (ACCUPRIL) 20 MG tablet    Sig: Take 1 tablet (20 mg total) by mouth at bedtime.    Dispense:  30 tablet    Refill:  6  . Insulin Pen Needle (UNIFINE PENTIPS) 31G X 5 MM MISC    Sig: USE AS DIRECTED    Dispense:  200 each    Refill:  6    Return precautions given.   Risks, benefits, and alternatives of the medications and treatment plan prescribed today were discussed, and patient expressed understanding.   Education regarding symptom management and diagnosis given to patient on AVS.  Continue to follow with Allegra Grana, FNP for routine health maintenance.   [name] and I agreed with plan.   Rennie Plowman, FNP

## 2017-01-05 NOTE — Patient Instructions (Addendum)
Start  amlodipine  Monitor blood pressure,  Goal is less than 130/80; if persistently higher, please make sooner follow up appointment so we can recheck you blood pressure and manage medications  Labs  Ultrasound of abdomen - if normal, more thinking stretching before working out and plenty of water  EGD  Follow up 3 months

## 2017-01-05 NOTE — Assessment & Plan Note (Signed)
Controlled on ppi. Pending egd, particularly in context of smoking history.

## 2017-01-05 NOTE — Assessment & Plan Note (Signed)
Unchanged. Symptom is consistent with muscle spasm. Not related to eating. reassured by benign abdominal exam. Pending Abdominal ultrasound. Advised patient adequate hydration, stretching prior to exercise. She will let me know symptom worsens.

## 2017-01-05 NOTE — Assessment & Plan Note (Signed)
Elevated. Will increase amlodipine. Patient will keep log.

## 2017-01-05 NOTE — Assessment & Plan Note (Signed)
Presume stable. Pending a1c

## 2017-01-06 ENCOUNTER — Encounter: Payer: Self-pay | Admitting: Family

## 2017-01-09 ENCOUNTER — Other Ambulatory Visit: Payer: Self-pay | Admitting: Family

## 2017-01-13 ENCOUNTER — Ambulatory Visit: Admission: RE | Admit: 2017-01-13 | Payer: 59 | Source: Ambulatory Visit

## 2017-01-14 ENCOUNTER — Other Ambulatory Visit: Payer: Self-pay | Admitting: Physician Assistant

## 2017-01-26 ENCOUNTER — Encounter: Payer: Self-pay | Admitting: Physician Assistant

## 2017-01-26 ENCOUNTER — Ambulatory Visit: Payer: Self-pay | Admitting: Physician Assistant

## 2017-01-26 VITALS — BP 140/100

## 2017-01-26 DIAGNOSIS — M25571 Pain in right ankle and joints of right foot: Secondary | ICD-10-CM

## 2017-01-26 NOTE — Progress Notes (Signed)
S: c/o r foot pain, sx for over 2 weeks, states it looks discolored and swollen, painful to bw, has to keep shoes on at home bc walking barefoot increases the pain, no numbness or tingling, no known injury  O: vitals wnl, nad, skin with some discoloration on r lateral malleolus to plantar area, some swelling in same area, area is tender to palp, full rom, pt walking with limp, ace wrap applied  A: acute foot pain  P: f/u with podiatry, continue diclofenac

## 2017-01-29 ENCOUNTER — Ambulatory Visit: Payer: Self-pay | Admitting: Physician Assistant

## 2017-01-29 ENCOUNTER — Encounter: Payer: Self-pay | Admitting: Physician Assistant

## 2017-01-29 VITALS — BP 120/80 | HR 100

## 2017-01-29 DIAGNOSIS — F419 Anxiety disorder, unspecified: Secondary | ICD-10-CM

## 2017-01-29 DIAGNOSIS — R002 Palpitations: Secondary | ICD-10-CM

## 2017-01-29 NOTE — Progress Notes (Signed)
S: Patient complains of anxiety, questionable elevated blood pressure, and palpitations. States she feels like her supervisor is harassing her with petty accusations.  He is fairly upset about the fact that the supervisor accused her of being a bully.  Denies chest pain or shortness of breath  O: Vitals with BP of 120/80, heart rate is close to 100 with an irregular rhythm, lungs are clear to auscultation heart with normal heart sounds but irregular rhythm. EKG shows normal sinus rhythm with a heart rate of 93  A: anxiety, palpitations  P:  Recommended  patient see the EAP, call HR, and follow-up with her regular doctor to be assessed for anxiety due to work-related stress

## 2017-02-01 ENCOUNTER — Ambulatory Visit: Payer: 59 | Admitting: Gastroenterology

## 2017-04-05 ENCOUNTER — Ambulatory Visit: Payer: 59 | Admitting: Podiatry

## 2017-04-05 ENCOUNTER — Ambulatory Visit (INDEPENDENT_AMBULATORY_CARE_PROVIDER_SITE_OTHER): Payer: 59

## 2017-04-05 ENCOUNTER — Encounter: Payer: Self-pay | Admitting: Podiatry

## 2017-04-05 DIAGNOSIS — L601 Onycholysis: Secondary | ICD-10-CM | POA: Diagnosis not present

## 2017-04-05 DIAGNOSIS — B351 Tinea unguium: Secondary | ICD-10-CM | POA: Diagnosis not present

## 2017-04-05 DIAGNOSIS — M76821 Posterior tibial tendinitis, right leg: Secondary | ICD-10-CM

## 2017-04-05 DIAGNOSIS — M722 Plantar fascial fibromatosis: Secondary | ICD-10-CM

## 2017-04-05 DIAGNOSIS — L603 Nail dystrophy: Secondary | ICD-10-CM | POA: Diagnosis not present

## 2017-04-05 NOTE — Progress Notes (Signed)
Subjective:  Patient ID: Julia Brown, female    DOB: 07/28/1970,  MRN: [id number] HPI Chief Complaint  Patient presents with  . Foot Pain    Medial foot right - aching, swelling x few months, uses compression stockings and acewrap for support-helps some    47 y.o. female presents with the above complaint.     Past Medical History:  Diagnosis Date  . Chicken pox   . Diabetes mellitus   . GERD (gastroesophageal reflux disease)   . Hyperlipidemia   . Hypertension   . UTI (lower urinary tract infection)    Past Surgical History:  Procedure Laterality Date  . ABLATION  2007   excessive bleeding  . CESAREAN SECTION  1994  . CHOLECYSTECTOMY  1993  . ORIF FEMUR FRACTURE Right    [place]  . TUBAL LIGATION    . uterine ablation      Current Outpatient Medications:  .  BIOTIN PO, Take by mouth., Disp: , Rfl:  .  ACCU-CHEK FASTCLIX LANCETS MISC, , Disp: , Rfl: 99 .  ACCU-CHEK GUIDE test strip, , Disp: , Rfl: 99 .  amLODipine (NORVASC) 5 MG tablet, Take 1 tablet (5 mg total) by mouth daily., Disp: 90 tablet, Rfl: 3 .  aspirin EC 81 MG tablet, Take 1 tablet (81 mg total) by mouth daily., Disp: 90 tablet, Rfl: 3 .  canagliflozin (INVOKANA) 300 MG TABS tablet, TAKE 1 TABLET BY MOUTH DAILY BEFORE BREAKFAST, Disp: 90 tablet, Rfl: 3 .  carvedilol (COREG) 3.125 MG tablet, TAKE 1 TABLET BY MOUTH TWICE DAILY WITH A MEAL, Disp: 180 tablet, Rfl: 6 .  diclofenac (VOLTAREN) 75 MG EC tablet, Take 1 tablet (75 mg total) by mouth 2 (two) times daily., Disp: 30 tablet, Rfl: 3 .  ibuprofen (ADVIL,MOTRIN) 800 MG tablet, , Disp: , Rfl: 12 .  Insulin Glargine (LANTUS SOLOSTAR) 100 UNIT/ML Solostar Pen, Inject 40 Units into the skin daily., Disp: 5 pen, Rfl: 12 .  Insulin Pen Needle (UNIFINE PENTIPS) 31G X 5 MM MISC, USE AS DIRECTED, Disp: 200 each, Rfl: 6 .  LANTUS SOLOSTAR 100 UNIT/ML Solostar Pen, INJECT 55 UNITS INTO THE SKIN DAILY, Disp: 15 mL, Rfl: 12 .  liraglutide (VICTOZA) 18 MG/3ML SOPN,  INJECT 0.3 MLS (1.8 MG TOTAL) INTO THE SKIN DAILY., Disp: 9 mL, Rfl: 11 .  meloxicam (MOBIC) 15 MG tablet, , Disp: , Rfl: 6 .  metFORMIN (GLUCOPHAGE) 500 MG tablet, Take 1 tablet (500 mg total) by mouth daily with breakfast., Disp: 270 tablet, Rfl: 1 .  omeprazole (PRILOSEC) 40 MG capsule, Take 1 capsule (40 mg total) by mouth daily., Disp: 30 capsule, Rfl: 6 .  quinapril (ACCUPRIL) 20 MG tablet, Take 1 tablet (20 mg total) by mouth at bedtime., Disp: 30 tablet, Rfl: 6 .  VITAMIN D, ERGOCALCIFEROL, PO, Take 50,000 Int'l Units by mouth. , Disp: , Rfl:   Allergies  Allergen Reactions  . Statins Palpitations  . Penicillins Rash   Review of Systems  Musculoskeletal: Positive for back pain.  All other systems reviewed and are negative.  Objective:  There were no vitals filed for this visit.  General: Well developed, nourished, in no acute distress, alert and oriented x3   Dermatological: Skin is warm, dry and supple bilateral. Nails x 10 are well maintained; remaining integument appears unremarkable at this time. There are no open sores, no preulcerative lesions, no rash or signs of infection present.  Toenails are thick yellow dystrophic possibly mycotic.  Vascular: Dorsalis  Pedis artery and Posterior Tibial artery pedal pulses are 2/4 bilateral with immedate capillary fill time. Pedal hair growth present. No varicosities and no lower extremity edema present bilateral.   Neruologic: Grossly intact via light touch bilateral. Vibratory intact via tuning fork bilateral. Protective threshold with Semmes Wienstein monofilament intact to all pedal sites bilateral. Patellar and Achilles deep tendon reflexes 2+ bilateral. No Babinski or clonus noted bilateral.   Musculoskeletal: No gross boney pedal deformities bilateral. No pain, crepitus, or limitation noted with foot and ankle range of motion bilateral. Muscular strength 5/5 in all groups tested bilateral.  Pain on palpation of the navicular  tuberosity at the posterior tibial tendon insertion.  She also has tenderness on palpation of the posterior tibial tendon as it courses beneath the medial malleolus extending to the navicular tuberosity.  Pain on inversion against resistance.  Gait: Unassisted, Nonantalgic.    Radiographs:  No acute injury.  Pes planus is noted.  She also has a small Osten navicular area.  Soft tissue swelling around the posterior tibial tendon.  Assessment & Plan:   Assessment: Posterior tibial tendinitis insertional in nature.  Pes planus.  Osten navicular area.  Nail dystrophy.  Plan: Discussed etiology pathology conservative versus surgical therapies.  After sterile Betadine skin prep and verbal confirmation injected 20 mg of Kenalog and 5 mg of Marcaine to the point of maximal tenderness along the navicular tuberosity and the posterior tibial tendon.  Also placed her in a Cam walker and dispensed anti-inflammatory prescription.  We will follow-up with her in 1 month if not improved an MRI will be necessary.  Samples of the toenails were taken today and sent for pathologic evaluation.     Max T. [place], [place]

## 2017-04-09 ENCOUNTER — Encounter: Payer: Self-pay | Admitting: Family

## 2017-04-20 ENCOUNTER — Ambulatory Visit (INDEPENDENT_AMBULATORY_CARE_PROVIDER_SITE_OTHER): Payer: 59

## 2017-04-20 ENCOUNTER — Encounter: Payer: Self-pay | Admitting: Podiatry

## 2017-04-20 ENCOUNTER — Ambulatory Visit: Payer: 59 | Admitting: Podiatry

## 2017-04-20 DIAGNOSIS — M009 Pyogenic arthritis, unspecified: Secondary | ICD-10-CM

## 2017-04-20 DIAGNOSIS — L03031 Cellulitis of right toe: Secondary | ICD-10-CM | POA: Diagnosis not present

## 2017-04-20 MED ORDER — GENTAMICIN SULFATE 0.1 % EX CREA: 1.0000 "application " | TOPICAL_CREAM | Freq: Three times a day (TID) | CUTANEOUS | 1 refills | Status: DC

## 2017-04-20 MED ORDER — DOXYCYCLINE HYCLATE 100 MG PO TABS: 100.0000 mg | ORAL_TABLET | Freq: Two times a day (BID) | ORAL | 0 refills | Status: DC

## 2017-04-20 NOTE — Patient Instructions (Signed)

## 2017-04-21 NOTE — Progress Notes (Signed)
HPI: Patient presents today with a new complaint and issue regarding a red hot swollen second digit of the right foot.  Patient was last seen on April 05, 2017 under the care of Dr. Al CorpusHyatt for posterior tibial tendinitis.  At that time she was placed in the immobilization cam boot and injection was performed.  Patient states that over the past 3-4 days she noticed that her second toe to her right foot became swollen and red.  Patient states she notices significant amount of throbbing.  Pain is described as 8 out of 10.  Wearing shoes and walking aggravates the toe.  Patient does have a history of diabetes mellitus.  Past Medical History:  Diagnosis Date  . Chicken pox   . Diabetes mellitus   . GERD (gastroesophageal reflux disease)   . Hyperlipidemia   . Hypertension   . UTI (lower urinary tract infection)      Physical Exam: General: The patient is alert and oriented x3 in no acute distress.  Dermatology: Skin is warm, dry and supple bilateral lower extremities. Negative for open lesions or macerations.  There is a heavy amount of fluctuance noted around the nail plate of the second digit right foot.  Fluid noted surrounding the nail plate as well as underneath the nail.  The nail appears to be somewhat white discoloration likely due to detachment from the underlying nail bed.  Vascular: Palpable pedal pulses bilaterally. Capillary refill within normal limits.  There is some erythema and edema noted localized to the second digit right foot.  This is consistent with a cellulitis of the toe.  Neurological: Epicritic and protective threshold grossly intact bilaterally.   Musculoskeletal Exam: Range of motion within normal limits to all pedal and ankle joints bilateral. Muscle strength 5/5 in all groups bilateral.   Radiographic Exam:  Normal osseous mineralization. Joint spaces preserved. No fracture/dislocation/boney destruction.    Assessment: -Cellulitis right second toe -Abscess  underlying the nail plate of the second digit right foot   Plan of Care:  -Patient was evaluated today.  X-rays reviewed today. -Inspection of the nail plate to the second toe indicated fluid surrounding the toenail.  Incision and drainage of the toenail abscess was performed.  At that time the decision was made to perform a total temporary nail avulsion to the second digit right foot. -Local anesthesia infiltration was utilized and digital block fashion to the second digit right foot.  The toe was prepped in aseptic manner and the toenail was removed in toto.  A fascial dressing was applied. -Prescription for gentamicin cream sent to the pharmacy.  Recommend daily application of gentamicin cream and a Band-Aid. -Prior to dressing the toe cultures were taken and sent to pathology of the drainage underlying the nail plate. -Prescription for doxycycline 100 mg #20 -Continue weightbearing in the immobilization cam boot -Today we will provide a note for work.  Refrain from work times 1 week -Follow-up at the next scheduled appointment with Dr. Al CorpusHyatt   Felecia ShellingBrent M. Finola Rosal, DPM [facility]  Dr. Felecia ShellingBrent M. Tenesha Garza, DPM    [address]                                        [place], [place] [address]                Office [phone]  Fax [phone]

## 2017-04-22 LAB — WOUND CULTURE: Organism ID, Bacteria: NONE SEEN

## 2017-05-05 ENCOUNTER — Ambulatory Visit: Payer: 59 | Admitting: Podiatry

## 2017-05-10 ENCOUNTER — Ambulatory Visit: Payer: 59 | Admitting: Podiatry

## 2017-05-10 ENCOUNTER — Encounter: Payer: Self-pay | Admitting: Podiatry

## 2017-05-10 DIAGNOSIS — L603 Nail dystrophy: Secondary | ICD-10-CM | POA: Diagnosis not present

## 2017-05-10 DIAGNOSIS — Z79899 Other long term (current) drug therapy: Secondary | ICD-10-CM | POA: Diagnosis not present

## 2017-05-10 MED ORDER — TERBINAFINE HCL 250 MG PO TABS: 250.0000 mg | ORAL_TABLET | Freq: Every day | ORAL | 0 refills | Status: DC

## 2017-05-10 NOTE — Patient Instructions (Signed)

## 2017-05-10 NOTE — Progress Notes (Signed)
She presents today for follow-up of her pathology report from her toenails.  She also states that her second digital nail which Dr. Logan BoresEvans removed is doing much better.  She also states that her posterior tibial tendinitis has resolved.  Objective: Vital signs are stable alert and oriented x3.  Pulses are palpable.  Pathology report does demonstrate 2 different species of fungus.  She has no tenderness on palpation of the posterior tibial tendon her second toe appears to be healing very well there is no erythema cellulitis drainage or odor.  Assessment: Well-healing posterior tibial tendinitis and infection to the second toe.  Positive onychomycosis.  Plan: Discussed etiology pathology conservative versus surgical therapies.  At this point we started her on Lamisil tablets 250 mg 1 p.o. daily x30 and I will follow-up with her in 1 month for another liver profile.  We requested a liver profile today should this come back abnormal I will notify her immediately.

## 2017-05-11 ENCOUNTER — Telehealth: Payer: Self-pay | Admitting: *Deleted

## 2017-05-11 LAB — HEPATIC FUNCTION PANEL
ALK PHOS: 58 IU/L (ref 39–117)
ALT: 16 IU/L (ref 0–32)
AST: 12 IU/L (ref 0–40)
Albumin: 4.5 g/dL (ref 3.5–5.5)
BILIRUBIN, DIRECT: 0.17 mg/dL (ref 0.00–0.40)
Bilirubin Total: 0.4 mg/dL (ref 0.0–1.2)
TOTAL PROTEIN: 7 g/dL (ref 6.0–8.5)

## 2017-05-11 NOTE — Telephone Encounter (Signed)
I informed pt of Dr. Hyatt's review of results and orders. 

## 2017-05-11 NOTE — Telephone Encounter (Signed)
-----   Message from Elinor ParkinsonMax T Hyatt, [place] sent at 05/11/2017  8:13 AM EST ----- Blood work looks perfect and may continue medication.

## 2017-06-07 ENCOUNTER — Ambulatory Visit: Payer: 59 | Admitting: Podiatry

## 2017-06-08 ENCOUNTER — Ambulatory Visit: Payer: Self-pay | Admitting: Family

## 2017-06-09 ENCOUNTER — Encounter: Payer: Self-pay | Admitting: Family

## 2017-06-09 ENCOUNTER — Ambulatory Visit (INDEPENDENT_AMBULATORY_CARE_PROVIDER_SITE_OTHER): Payer: Self-pay | Admitting: Family

## 2017-06-09 VITALS — BP 124/66 | HR 92 | Temp 98.3°F | Resp 16 | Wt 233.0 lb

## 2017-06-09 DIAGNOSIS — F4323 Adjustment disorder with mixed anxiety and depressed mood: Secondary | ICD-10-CM

## 2017-06-09 DIAGNOSIS — I10 Essential (primary) hypertension: Secondary | ICD-10-CM

## 2017-06-09 DIAGNOSIS — M62838 Other muscle spasm: Secondary | ICD-10-CM

## 2017-06-09 DIAGNOSIS — E119 Type 2 diabetes mellitus without complications: Secondary | ICD-10-CM

## 2017-06-09 MED ORDER — SERTRALINE HCL 50 MG PO TABS: 50.0000 mg | ORAL_TABLET | Freq: Every day | ORAL | 3 refills | Status: DC

## 2017-06-09 NOTE — Assessment & Plan Note (Signed)
No recent recurrence. Patient will let me know

## 2017-06-09 NOTE — Assessment & Plan Note (Signed)
Trial of Zoloft .will follow.

## 2017-06-09 NOTE — Assessment & Plan Note (Addendum)
Pending A1c.  Also consulted pharmacist, Rayfield Citizenaroline, for input regarding making medications as affordable as possible due to recent financial changes/job loss.

## 2017-06-09 NOTE — Assessment & Plan Note (Signed)
At goal. Continue current regimen. 

## 2017-06-09 NOTE — Progress Notes (Signed)
Subjective:    Patient ID: Julia Brown, female    DOB: 12/15/1970, 47 y.o.   MRN: [id number]  CC: Julia Brown is a 47 y.o. female who presents today for follow up.   HPI: Recently loss job and very anxious. Going through appeal process. More anxiety. Trouble eating, just not hungry. A little depression as worried about livilihood.   No si/hi. No hallunicinations, mania.   Not out of medications. Has 3 months supply.   HTN- compliant with medications. Denies exertional chest pain or pressure, numbness or tingling radiating to left arm or jaw, palpitations, dizziness, frequent headaches, changes in vision, or shortness of breath.   Left side pain- hasnt had the pain in a couple of months. If twists certain way, will feel 'catching. '       HISTORY:  Past Medical History:  Diagnosis Date  . Chicken pox   . Diabetes mellitus   . GERD (gastroesophageal reflux disease)   . Hyperlipidemia   . Hypertension   . UTI (lower urinary tract infection)    Past Surgical History:  Procedure Laterality Date  . ABLATION  2007   excessive bleeding  . CESAREAN SECTION  1994  . CHOLECYSTECTOMY  1993  . ORIF FEMUR FRACTURE Right    [place]  . TUBAL LIGATION    . uterine ablation     Family History  Problem Relation Age of Onset  . Heart disease Father   . Hyperlipidemia Father   . Diabetes Father   . Hyperlipidemia Mother   . Hypertension Mother   . Schizophrenia Mother   . Depression Mother   . Diabetes Mother   . Heart disease Other   . Cancer Maternal Aunt        breast  . Diabetes Maternal Grandmother   . Diabetes Maternal Grandfather     Allergies: Statins and Penicillins Current Outpatient Medications on File Prior to Visit  Medication Sig Dispense Refill  . ACCU-CHEK FASTCLIX LANCETS MISC   99  . ACCU-CHEK GUIDE test strip   99  . amLODipine (NORVASC) 5 MG tablet Take 1 tablet (5 mg total) by mouth daily. 90 tablet 3  . aspirin EC 81 MG tablet Take 1  tablet (81 mg total) by mouth daily. 90 tablet 3  . BIOTIN PO Take by mouth.    . canagliflozin (INVOKANA) 300 MG TABS tablet TAKE 1 TABLET BY MOUTH DAILY BEFORE BREAKFAST 90 tablet 3  . carvedilol (COREG) 3.125 MG tablet TAKE 1 TABLET BY MOUTH TWICE DAILY WITH A MEAL 180 tablet 6  . ibuprofen (ADVIL,MOTRIN) 800 MG tablet   12  . Insulin Glargine (LANTUS SOLOSTAR) 100 UNIT/ML Solostar Pen Inject 40 Units into the skin daily. 5 pen 12  . Insulin Pen Needle (UNIFINE PENTIPS) 31G X 5 MM MISC USE AS DIRECTED 200 each 6  . LANTUS SOLOSTAR 100 UNIT/ML Solostar Pen INJECT 55 UNITS INTO THE SKIN DAILY 15 mL 12  . liraglutide (VICTOZA) 18 MG/3ML SOPN INJECT 0.3 MLS (1.8 MG TOTAL) INTO THE SKIN DAILY. 9 mL 11  . meloxicam (MOBIC) 15 MG tablet   6  . metFORMIN (GLUCOPHAGE) 500 MG tablet Take 1 tablet (500 mg total) by mouth daily with breakfast. 270 tablet 1  . omeprazole (PRILOSEC) 40 MG capsule Take 1 capsule (40 mg total) by mouth daily. 30 capsule 6  . quinapril (ACCUPRIL) 20 MG tablet Take 1 tablet (20 mg total) by mouth at bedtime. 30 tablet 6  .  terbinafine (LAMISIL) 250 MG tablet Take 1 tablet (250 mg total) by mouth daily. 90 tablet 0  . VITAMIN D, ERGOCALCIFEROL, PO Take 50,000 Int'l Units by mouth.      No current facility-administered medications on file prior to visit.     Social History   Tobacco Use  . Smoking status: Current Every Day Smoker    Packs/day: 0.50    Years: 10.00    Pack years: 5.00    Types: Cigarettes  . Smokeless tobacco: Never Used  . Tobacco comment: smoked < 1 pack a day  Substance Use Topics  . Alcohol use: Yes    Alcohol/week: 0.0 oz    Comment: weekend 3-4drinks  . Drug use: No    Review of Systems  Constitutional: Negative for chills and fever.  Respiratory: Negative for cough.   Cardiovascular: Negative for chest pain and palpitations.  Gastrointestinal: Negative for nausea and vomiting.  Psychiatric/Behavioral: Negative for sleep disturbance and  suicidal ideas. The patient is nervous/anxious.       Objective:    BP 124/66 (BP Location: Left Arm, Patient Position: Sitting, Cuff Size: Large)   Pulse 92   Temp 98.3 F (36.8 C) (Oral)   Resp 16   Wt 233 lb (105.7 kg)   SpO2 98%   BMI 33.43 kg/m  BP Readings from Last 3 Encounters:  06/09/17 124/66  01/29/17 120/80  01/26/17 (!) 140/100   Wt Readings from Last 3 Encounters:  06/09/17 233 lb (105.7 kg)  01/05/17 236 lb 3.2 oz (107.1 kg)  06/10/16 242 lb 8 oz (110 kg)    Physical Exam  Constitutional: She appears well-developed and well-nourished.  Eyes: Conjunctivae are normal.  Cardiovascular: Normal rate, regular rhythm, normal heart sounds and normal pulses.  Pulmonary/Chest: Effort normal and breath sounds normal. She has no wheezes. She has no rhonchi. She has no rales.  Neurological: She is alert.  Skin: Skin is warm and dry.  Psychiatric: She has a normal mood and affect. Her speech is normal and behavior is normal. Thought content normal.  Vitals reviewed.      Assessment & Plan:   Problem List Items Addressed This Visit      Cardiovascular and Mediastinum   Essential hypertension (Chronic)    At goal.  Continue current regimen        Endocrine   Diabetes type 2, controlled (HCC) (Chronic)    Pending A1c.  Also consulted pharmacist, Rayfield Citizenaroline, for input regarding making medications as affordable as possible due to recent financial changes/job loss.         Other   Adjustment disorder with mixed anxiety and depressed mood - Primary    Trial of Zoloft .will follow.      Relevant Medications   sertraline (ZOLOFT) 50 MG tablet   Muscle spasm    No recent recurrence. Patient will let me know          I have discontinued Julia Brown's diclofenac, doxycycline, and gentamicin cream. I am also having her start on sertraline. Additionally, I am having her maintain her (VITAMIN D, ERGOCALCIFEROL, PO), aspirin EC, amLODipine, carvedilol, Insulin  Glargine, canagliflozin, liraglutide, metFORMIN, omeprazole, quinapril, Insulin Pen Needle, LANTUS SOLOSTAR, ACCU-CHEK GUIDE, ibuprofen, ACCU-CHEK FASTCLIX LANCETS, meloxicam, BIOTIN PO, and terbinafine.   Meds ordered this encounter  Medications  . sertraline (ZOLOFT) 50 MG tablet    Sig: Take 1 tablet (50 mg total) by mouth at bedtime.    Dispense:  90 tablet  Refill:  3    Order Specific Question:   Supervising Provider    Answer:   Sherlene Shams [[id number]]    Return precautions given.   Risks, benefits, and alternatives of the medications and treatment plan prescribed today were discussed, and patient expressed understanding.   Education regarding symptom management and diagnosis given to patient on AVS.  Continue to follow with Allegra Grana, FNP for routine health maintenance.   [name] and I agreed with plan.   Rennie Plowman, FNP

## 2017-06-09 NOTE — Patient Instructions (Signed)
Start zoloft 50mg  at bedtime. May need to increase to 100mg  ( two tablets) in a couple of weeks.   Fasting labs when you can- please call for an appointment.   Im thinking of you , please let me know how I can help.

## 2017-06-14 ENCOUNTER — Telehealth: Payer: Self-pay | Admitting: Family

## 2017-06-14 ENCOUNTER — Encounter: Payer: Self-pay | Admitting: Family

## 2017-06-14 NOTE — Telephone Encounter (Signed)
close

## 2017-06-21 ENCOUNTER — Ambulatory Visit: Payer: 59 | Admitting: Podiatry

## 2017-06-30 ENCOUNTER — Encounter: Payer: Self-pay | Admitting: Family

## 2017-06-30 NOTE — Telephone Encounter (Signed)
Previously prescribed by Dr Al CorpusHyatt  Last office visit 06/09/17 Next office visit 09/09/17

## 2017-07-02 ENCOUNTER — Other Ambulatory Visit: Payer: Self-pay | Admitting: Family

## 2017-07-02 MED ORDER — MELOXICAM 7.5 MG PO TABS: 7.5000 mg | ORAL_TABLET | Freq: Every day | ORAL | 1 refills | Status: DC

## 2017-08-07 DIAGNOSIS — R197 Diarrhea, unspecified: Secondary | ICD-10-CM | POA: Insufficient documentation

## 2017-08-07 DIAGNOSIS — I10 Essential (primary) hypertension: Secondary | ICD-10-CM | POA: Insufficient documentation

## 2017-08-07 DIAGNOSIS — R112 Nausea with vomiting, unspecified: Secondary | ICD-10-CM | POA: Insufficient documentation

## 2017-08-07 DIAGNOSIS — Z794 Long term (current) use of insulin: Secondary | ICD-10-CM | POA: Insufficient documentation

## 2017-08-07 DIAGNOSIS — Z79899 Other long term (current) drug therapy: Secondary | ICD-10-CM | POA: Insufficient documentation

## 2017-08-07 DIAGNOSIS — F17210 Nicotine dependence, cigarettes, uncomplicated: Secondary | ICD-10-CM | POA: Insufficient documentation

## 2017-08-07 DIAGNOSIS — Z7982 Long term (current) use of aspirin: Secondary | ICD-10-CM | POA: Insufficient documentation

## 2017-08-07 DIAGNOSIS — E119 Type 2 diabetes mellitus without complications: Secondary | ICD-10-CM | POA: Insufficient documentation

## 2017-08-07 DIAGNOSIS — R1031 Right lower quadrant pain: Secondary | ICD-10-CM | POA: Insufficient documentation

## 2017-08-08 ENCOUNTER — Emergency Department: Payer: Self-pay

## 2017-08-08 ENCOUNTER — Other Ambulatory Visit: Payer: Self-pay

## 2017-08-08 ENCOUNTER — Encounter: Payer: Self-pay | Admitting: Radiology

## 2017-08-08 ENCOUNTER — Emergency Department
Admission: EM | Admit: 2017-08-08 | Discharge: 2017-08-08 | Disposition: A | Discharge status: Home / Self Care | Payer: Self-pay | Attending: Emergency Medicine | Admitting: Emergency Medicine

## 2017-08-08 DIAGNOSIS — R1011 Right upper quadrant pain: Secondary | ICD-10-CM

## 2017-08-08 DIAGNOSIS — R112 Nausea with vomiting, unspecified: Secondary | ICD-10-CM

## 2017-08-08 DIAGNOSIS — R197 Diarrhea, unspecified: Secondary | ICD-10-CM

## 2017-08-08 LAB — COMPREHENSIVE METABOLIC PANEL
ALT: 24 U/L (ref 14–54)
ANION GAP: 12 (ref 5–15)
AST: 20 U/L (ref 15–41)
Albumin: 4.5 g/dL (ref 3.5–5.0)
Alkaline Phosphatase: 70 U/L (ref 38–126)
BUN: 18 mg/dL (ref 6–20)
CHLORIDE: 100 mmol/L — AB (ref 101–111)
CO2: 21 mmol/L — ABNORMAL LOW (ref 22–32)
Calcium: 9.2 mg/dL (ref 8.9–10.3)
Creatinine, Ser: 0.7 mg/dL (ref 0.44–1.00)
GFR calc Af Amer: >60 mL/min (ref >60)
Glucose, Bld: 133 mg/dL — ABNORMAL HIGH (ref 65–99)
POTASSIUM: 4.5 mmol/L (ref 3.5–5.1)
SODIUM: 133 mmol/L — AB (ref 135–145)
Total Bilirubin: 1.3 mg/dL — ABNORMAL HIGH (ref 0.3–1.2)
Total Protein: 7.9 g/dL (ref 6.5–8.1)

## 2017-08-08 LAB — CBC
HCT: 45.3 % (ref 35.0–47.0)
HEMOGLOBIN: 15.2 g/dL (ref 12.0–16.0)
MCH: 30.7 pg (ref 26.0–34.0)
MCHC: 33.6 g/dL (ref 32.0–36.0)
MCV: 91.2 fL (ref 80.0–100.0)
PLATELETS: 293 10*3/uL (ref 150–440)
RBC: 4.97 MIL/uL (ref 3.80–5.20)
RDW: 12.9 % (ref 11.5–14.5)
WBC: 12.2 10*3/uL — AB (ref 3.6–11.0)

## 2017-08-08 LAB — URINALYSIS, COMPLETE (UACMP) WITH MICROSCOPIC
Bacteria, UA: NONE SEEN
Bilirubin Urine: NEGATIVE
Hgb urine dipstick: NEGATIVE
KETONES UR: 80 mg/dL — AB
LEUKOCYTES UA: NEGATIVE
Nitrite: NEGATIVE
PROTEIN: NEGATIVE mg/dL
Specific Gravity, Urine: 1.04 — ABNORMAL HIGH (ref 1.005–1.030)
pH: 5 (ref 5.0–8.0)

## 2017-08-08 LAB — LIPASE, BLOOD: LIPASE: 23 U/L (ref 11–51)

## 2017-08-08 MED ORDER — SODIUM CHLORIDE 0.9 % IV BOLUS
1000.0000 mL | Freq: Once | INTRAVENOUS | Status: AC
  Administered 2017-08-08: 1000 mL via INTRAVENOUS

## 2017-08-08 MED ORDER — KETOROLAC TROMETHAMINE 30 MG/ML IJ SOLN
30.0000 mg | Freq: Once | INTRAMUSCULAR | Status: AC
  Administered 2017-08-08: 30 mg via INTRAVENOUS
  Filled 2017-08-08: qty 1

## 2017-08-08 MED ORDER — ONDANSETRON HCL 4 MG/2ML IJ SOLN
4.0000 mg | Freq: Once | INTRAMUSCULAR | Status: AC
  Administered 2017-08-08: 4 mg via INTRAVENOUS
  Filled 2017-08-08: qty 2

## 2017-08-08 MED ORDER — TRAMADOL HCL 50 MG PO TABS: 50.0000 mg | ORAL_TABLET | Freq: Four times a day (QID) | ORAL | 0 refills | Status: DC | PRN

## 2017-08-08 MED ORDER — MORPHINE SULFATE (PF) 4 MG/ML IV SOLN
4.0000 mg | Freq: Once | INTRAVENOUS | Status: AC
  Administered 2017-08-08: 4 mg via INTRAVENOUS
  Filled 2017-08-08: qty 1

## 2017-08-08 MED ORDER — METOCLOPRAMIDE HCL 5 MG/ML IJ SOLN
10.0000 mg | Freq: Once | INTRAMUSCULAR | Status: AC
  Administered 2017-08-08: 10 mg via INTRAVENOUS
  Filled 2017-08-08: qty 2

## 2017-08-08 MED ORDER — METOCLOPRAMIDE HCL 10 MG PO TABS: 10.0000 mg | ORAL_TABLET | Freq: Three times a day (TID) | ORAL | 0 refills | Status: DC | PRN

## 2017-08-08 MED ORDER — MORPHINE SULFATE (PF) 4 MG/ML IV SOLN
INTRAVENOUS | Status: AC
  Filled 2017-08-08: qty 1

## 2017-08-08 MED ORDER — ONDANSETRON HCL 4 MG/2ML IJ SOLN
4.0000 mg | Freq: Once | INTRAMUSCULAR | Status: AC
  Administered 2017-08-08: 4 mg via INTRAVENOUS

## 2017-08-08 MED ORDER — ONDANSETRON HCL 4 MG/2ML IJ SOLN
INTRAMUSCULAR | Status: AC
  Filled 2017-08-08: qty 2

## 2017-08-08 MED ORDER — IOPAMIDOL (ISOVUE-300) INJECTION 61%
100.0000 mL | Freq: Once | INTRAVENOUS | Status: AC | PRN
  Administered 2017-08-08: 100 mL via INTRAVENOUS

## 2017-08-08 MED ORDER — MORPHINE SULFATE (PF) 4 MG/ML IV SOLN
4.0000 mg | Freq: Once | INTRAVENOUS | Status: AC
Start: 2017-08-08 — End: 2017-08-08
  Administered 2017-08-08: 4 mg via INTRAVENOUS

## 2017-08-08 NOTE — ED Provider Notes (Signed)
[facility] Emergency Department Provider Note   ____________________________________________   First MD Initiated Contact with Patient 08/08/17 0140     (approximate)  I have reviewed the triage vital signs and the nursing notes.   HISTORY  Chief Complaint Abdominal Pain    HPI Julia Brown is a 47 y.o. female who comes into the hospital today with some abdominal pain.  The patient reports that she is had pain on the right side for about 3 days.  The patient is also had some nausea which she reports is much worse than the pain.  She has been taking Zofran at home as well as Tylenol.  The patient states that she can eat yesterday and today the pain became very severe.  She reports that she was having pain and vomiting.  The patient states that her emesis was clear but then turned yellow.  She states that she vomited an innumerable amount of times.  The patient also had some diarrhea that was brown and watery.  The patient states that it was about 3 episodes.  She denies sick contacts and denies any fevers.  The patient rates her pain a 6 out of 10 in intensity currently.  She is here today for evaluation of her symptoms.   Past Medical History:  Diagnosis Date  . Chicken pox   . Diabetes mellitus   . GERD (gastroesophageal reflux disease)   . Hyperlipidemia   . Hypertension   . UTI (lower urinary tract infection)     Patient Active Problem List   Diagnosis Date Noted  . Palpitations 05/20/2016  . Anxiety state 01/14/2016  . Vitamin D deficiency 05/07/2015  . Essential hypertension 01/31/2015  . Vulvovaginal candidiasis 11/01/2014  . Muscle spasm 04/26/2014  . Routine general medical examination at a health care facility 08/28/2013  . Adjustment disorder with mixed anxiety and depressed mood 08/28/2013  . Tobacco abuse 08/28/2013  . Obesity (BMI 30-39.9) 08/28/2013  . GERD (gastroesophageal reflux disease) 08/12/2012  . Hair loss 08/12/2012    . Hyperlipidemia 11/10/2010  . Diabetes type 2, controlled (HCC) 11/10/2010    Past Surgical History:  Procedure Laterality Date  . ABLATION  2007   excessive bleeding  . CESAREAN SECTION  1994  . CHOLECYSTECTOMY  1993  . ORIF FEMUR FRACTURE Right    [place]  . TUBAL LIGATION    . uterine ablation      Prior to Admission medications   Medication Sig Start Date End Date Taking? Authorizing Provider  ACCU-CHEK FASTCLIX LANCETS MISC  01/19/17   [provider]  ACCU-CHEK GUIDE test strip  01/19/17   [provider]  amLODipine (NORVASC) 5 MG tablet Take 1 tablet (5 mg total) by mouth daily. 01/05/17   Allegra Grana, FNP  aspirin EC 81 MG tablet Take 1 tablet (81 mg total) by mouth daily. 06/10/16   Almond Lint, MD  BIOTIN PO Take by mouth.    [provider]  canagliflozin (INVOKANA) 300 MG TABS tablet TAKE 1 TABLET BY MOUTH DAILY BEFORE BREAKFAST 01/05/17   Allegra Grana, FNP  carvedilol (COREG) 3.125 MG tablet TAKE 1 TABLET BY MOUTH TWICE DAILY WITH A MEAL 01/05/17   Allegra Grana, FNP  ibuprofen (ADVIL,MOTRIN) 800 MG tablet  02/03/17   [provider]  Insulin Glargine (LANTUS SOLOSTAR) 100 UNIT/ML Solostar Pen Inject 40 Units into the skin daily. 01/05/17   Allegra Grana, FNP  [facility] Needle (UNIFINE PENTIPS)  31G X 5 MM MISC USE AS DIRECTED 01/05/17   Allegra Grana, FNP  LANTUS SOLOSTAR 100 UNIT/ML Solostar Pen INJECT 55 UNITS INTO THE SKIN DAILY 01/14/17   Sherrie Mustache, Roselyn Bering, PA-C  liraglutide (VICTOZA) 18 MG/3ML SOPN INJECT 0.3 MLS (1.8 MG TOTAL) INTO THE SKIN DAILY. 01/05/17   Allegra Grana, FNP  meloxicam (MOBIC) 7.5 MG tablet Take 1 tablet (7.5 mg total) by mouth daily. With food 07/02/17   Allegra Grana, FNP  metFORMIN (GLUCOPHAGE) 500 MG tablet Take 1 tablet (500 mg total) by mouth daily with breakfast. 01/05/17   Allegra Grana, FNP  metoCLOPramide (REGLAN) 10 MG tablet Take 1 tablet (10 mg total)  by mouth every 8 (eight) hours as needed. 08/08/17   Rebecka Apley, MD  omeprazole (PRILOSEC) 40 MG capsule Take 1 capsule (40 mg total) by mouth daily. 01/05/17   Allegra Grana, FNP  quinapril (ACCUPRIL) 20 MG tablet Take 1 tablet (20 mg total) by mouth at bedtime. 01/05/17   Allegra Grana, FNP  sertraline (ZOLOFT) 50 MG tablet Take 1 tablet (50 mg total) by mouth at bedtime. 06/09/17   Allegra Grana, FNP  terbinafine (LAMISIL) 250 MG tablet Take 1 tablet (250 mg total) by mouth daily. 05/10/17   Hyatt, Max T, DPM  traMADol (ULTRAM) 50 MG tablet Take 1 tablet (50 mg total) by mouth every 6 (six) hours as needed. 08/08/17   Rebecka Apley, MD  VITAMIN D, ERGOCALCIFEROL, PO Take 50,000 Int'l Units by mouth.     [provider]    Allergies Statins and Penicillins  Family History  Problem Relation Age of Onset  . Heart disease Father   . Hyperlipidemia Father   . Diabetes Father   . Hyperlipidemia Mother   . Hypertension Mother   . Schizophrenia Mother   . Depression Mother   . Diabetes Mother   . Heart disease Other   . Cancer Maternal Aunt        breast  . Diabetes Maternal Grandmother   . Diabetes Maternal Grandfather     Social History Social History   Tobacco Use  . Smoking status: Current Every Day Smoker    Packs/day: 0.50    Years: 10.00    Pack years: 5.00    Types: Cigarettes  . Smokeless tobacco: Never Used  . Tobacco comment: smoked < 1 pack a day  Substance Use Topics  . Alcohol use: Yes    Alcohol/week: 0.0 oz    Comment: weekend 3-4drinks  . Drug use: No    Review of Systems  Constitutional: No fever/chills Eyes: No visual changes. ENT: No sore throat. Cardiovascular: Denies chest pain. Respiratory: Denies shortness of breath. Gastrointestinal: abdominal pain.  nausea, vomiting. diarrhea.  No constipation. Genitourinary: Negative for dysuria. Musculoskeletal: Negative for back pain. Skin: Negative for  rash. Neurological: Negative for headaches   ____________________________________________   PHYSICAL EXAM:  VITAL SIGNS: ED Triage Vitals [08/08/17 0002]  Enc Vitals Group     BP 135/78     Pulse Rate 89     Resp 18     Temp 98.7 F (37.1 C)     Temp Source Oral     SpO2 100 %     Weight 238 lb (108 kg)     Height  (1.778 m)     Head Circumference      Peak Flow      Pain Score 5     Pain  Loc      Pain Edu?      Excl. in GC?     Constitutional: Alert and oriented. Well appearing and in moderate distress. Eyes: Conjunctivae are normal. PERRL. EOMI. Head: Atraumatic. Nose: No congestion/rhinnorhea. Mouth/Throat: Mucous membranes are moist.  Oropharynx non-erythematous. Cardiovascular: Normal rate, regular rhythm. Grossly normal heart sounds.  Good peripheral circulation. Respiratory: Normal respiratory effort.  No retractions. Lungs CTAB. Gastrointestinal: Soft with some right sided abd pain worse in the right upper quadrant. No distention. Positive bowel sounds Musculoskeletal: No lower extremity tenderness nor edema.   Neurologic:  Normal speech and language.  Skin:  Skin is warm, dry and intact. Psychiatric: Mood and affect are normal.   ____________________________________________   LABS (all labs ordered are listed, but only abnormal results are displayed)  Labs Reviewed  COMPREHENSIVE METABOLIC PANEL - Abnormal; Notable for the following components:      Result Value   Sodium 133 (*)    Chloride 100 (*)    CO2 21 (*)    Glucose, Bld 133 (*)    Total Bilirubin 1.3 (*)    All other components within normal limits  CBC - Abnormal; Notable for the following components:   WBC 12.2 (*)    All other components within normal limits  URINALYSIS, COMPLETE (UACMP) WITH MICROSCOPIC - Abnormal; Notable for the following components:   Color, Urine YELLOW (*)    APPearance HAZY (*)    Specific Gravity, Urine 1.040 (*)    Glucose, UA >=500 (*)    Ketones, ur  80 (*)    All other components within normal limits  LIPASE, BLOOD   ____________________________________________  EKG  none ____________________________________________  RADIOLOGY  ED MD interpretation:  CT abd and pelvis: No acute abnormality seen to explain the patient's symptoms, mild nonspecific nodularity of the left adrenal gland only minimally changed from 2015.  Official radiology report(s): Ct Abdomen Pelvis W Contrast  Result Date: 08/08/2017 CLINICAL DATA:  Acute onset of right lower abdominal pain and vomiting. Possible syncope. EXAM: CT ABDOMEN AND PELVIS WITH CONTRAST TECHNIQUE: Multidetector CT imaging of the abdomen and pelvis was performed using the standard protocol following bolus administration of intravenous contrast. CONTRAST:  ISOVUE-300 IOPAMIDOL (ISOVUE-300) INJECTION 61% COMPARISON:  CT of the abdomen and pelvis from 10/09/2013 FINDINGS: Lower chest: The visualized lung bases are grossly clear. The visualized portions of the mediastinum are unremarkable. Hepatobiliary: The liver is unremarkable in appearance. The patient is status post cholecystectomy, with clips noted at the gallbladder fossa. The common bile duct remains normal in caliber. Pancreas: The pancreas is within normal limits. Spleen: The spleen is unremarkable in appearance. Adrenals/Urinary Tract: There is mild nonspecific nodularity of the left adrenal gland, only minimally changed from 2015 and likely benign. The right adrenal gland is unremarkable. The kidneys are within normal limits. There is no evidence of hydronephrosis. No renal or ureteral stones are identified. No perinephric stranding is appreciated. Stomach/Bowel: The stomach is unremarkable in appearance. The small bowel is within normal limits. The appendix is normal in caliber, without evidence of appendicitis. The colon is unremarkable in appearance. Vascular/Lymphatic: The abdominal aorta is unremarkable in appearance. The inferior  vena cava is grossly unremarkable. No retroperitoneal lymphadenopathy is seen. No pelvic sidewall lymphadenopathy is identified. Reproductive: The bladder is mildly distended and grossly unremarkable. The uterus is unremarkable in appearance. The ovaries are relatively symmetric. No suspicious adnexal masses are seen. Other: Postoperative scarring is noted along the lower abdominal wall. Musculoskeletal: No  acute osseous abnormalities are identified. A pin is noted at the right femoral neck. The visualized musculature is unremarkable in appearance. IMPRESSION: 1. No acute abnormality seen to explain the patient's symptoms. 2. Mild nonspecific nodularity of the left adrenal gland, only minimally changed from 2015 and likely benign. Electronically Signed   By: Roanna Raider M.D.   On: 08/08/2017 03:02    ____________________________________________   PROCEDURES  Procedure(s) performed: None  Procedures  Critical Care performed: No  ____________________________________________   INITIAL IMPRESSION / ASSESSMENT AND PLAN / ED COURSE  As part of my medical decision making, I reviewed the following data within the electronic MEDICAL RECORD NUMBER Notes from prior ED visits and [place] Controlled Substance Database   This is a 47 year old female who comes into the hospital today with some abdominal pain.  The patient was in some significant distress.  My differential diagnosis includes appendicitis, colitis, choledocholithiasis  We did check some blood work on the patient which was unremarkable.  The patient's urine was concentrated with a specific gravity of 1.040 and elevated blood glucose but was otherwise unremarkable with no signs of infection.  The patient CBC was 12.2.  I did order a CT scan on the patient but it did not show any signs of appendicitis or choledocholithiasis.  I did give the patient a liter of normal saline as well as some morphine and Zofran.  She then received some more morphine  and then eventually some Toradol and Reglan.  The patient's pain did improve and I explained to her that we did not find a cause to her pain.  It is possible that the patient is having some pain from gastroenteritis given her vomiting and diarrhea.  The patient understands and agrees with that plan.  She will be discharged home and should anything get worse she should return for further evaluation.  The patient will be discharged home.      ____________________________________________   FINAL CLINICAL IMPRESSION(S) / ED DIAGNOSES  Final diagnoses:  Right upper quadrant abdominal pain  Nausea vomiting and diarrhea     ED Discharge Orders        Ordered    metoCLOPramide (REGLAN) 10 MG tablet  Every 8 hours PRN     08/08/17 0642    traMADol (ULTRAM) 50 MG tablet  Every 6 hours PRN     08/08/17 4132       Note:  This document was prepared using Dragon voice recognition software and may include unintentional dictation errors.    Rebecka Apley, MD 08/08/17 [phone]

## 2017-08-08 NOTE — ED Triage Notes (Signed)
Reports right lower abdominal pain for 3 days with vomiting.  Possible syncopal episode earlier today.

## 2017-08-08 NOTE — ED Notes (Signed)
Pt reports sudden onset of sharp pain to right flank area at approx 1800 yesterday with ongoing nausea and approx 5-6 episodes of vomiting, pt denies pain at palpation, pain and nausea controlled with meds att, denies hx of kidney reports surgical hx of gall bladder removal, denies UTI s/sx

## 2017-08-08 NOTE — ED Notes (Signed)
Pt found assigned to room att, pt not in room, CT?

## 2017-08-08 NOTE — Discharge Instructions (Addendum)
Your evaluation today is unremarkable.  Your CT scan did not show any acute cause for the abdominal pain.  Please take the medications as has been's prescribed and please follow-up with your primary care physician.  If the pain gets worse or you are unable to keep any fluids or food down please return to the emergency department for reevaluation and admission

## 2017-08-15 ENCOUNTER — Emergency Department: Payer: Self-pay

## 2017-08-15 ENCOUNTER — Other Ambulatory Visit: Payer: Self-pay

## 2017-08-15 ENCOUNTER — Emergency Department
Admission: EM | Admit: 2017-08-15 | Discharge: 2017-08-15 | Disposition: A | Discharge status: Home / Self Care | Payer: Self-pay | Attending: Emergency Medicine | Admitting: Emergency Medicine

## 2017-08-15 ENCOUNTER — Encounter: Payer: Self-pay | Admitting: Emergency Medicine

## 2017-08-15 DIAGNOSIS — Z79899 Other long term (current) drug therapy: Secondary | ICD-10-CM | POA: Insufficient documentation

## 2017-08-15 DIAGNOSIS — Z9049 Acquired absence of other specified parts of digestive tract: Secondary | ICD-10-CM | POA: Insufficient documentation

## 2017-08-15 DIAGNOSIS — Z794 Long term (current) use of insulin: Secondary | ICD-10-CM | POA: Insufficient documentation

## 2017-08-15 DIAGNOSIS — F17210 Nicotine dependence, cigarettes, uncomplicated: Secondary | ICD-10-CM | POA: Insufficient documentation

## 2017-08-15 DIAGNOSIS — Z7982 Long term (current) use of aspirin: Secondary | ICD-10-CM | POA: Insufficient documentation

## 2017-08-15 DIAGNOSIS — E119 Type 2 diabetes mellitus without complications: Secondary | ICD-10-CM | POA: Insufficient documentation

## 2017-08-15 DIAGNOSIS — K2900 Acute gastritis without bleeding: Secondary | ICD-10-CM | POA: Insufficient documentation

## 2017-08-15 DIAGNOSIS — I10 Essential (primary) hypertension: Secondary | ICD-10-CM | POA: Insufficient documentation

## 2017-08-15 LAB — COMPREHENSIVE METABOLIC PANEL
ALBUMIN: 4.2 g/dL (ref 3.5–5.0)
ALK PHOS: 56 U/L (ref 38–126)
ALT: 31 U/L (ref 14–54)
ANION GAP: 8 (ref 5–15)
AST: 25 U/L (ref 15–41)
BUN: 16 mg/dL (ref 6–20)
CALCIUM: 9.4 mg/dL (ref 8.9–10.3)
CHLORIDE: 104 mmol/L (ref 101–111)
CO2: 27 mmol/L (ref 22–32)
Creatinine, Ser: 0.7 mg/dL (ref 0.44–1.00)
GFR calc Af Amer: >60 mL/min (ref >60)
GFR calc non Af Amer: >60 mL/min (ref >60)
GLUCOSE: 141 mg/dL — AB (ref 65–99)
POTASSIUM: 4.1 mmol/L (ref 3.5–5.1)
SODIUM: 139 mmol/L (ref 135–145)
Total Bilirubin: 0.6 mg/dL (ref 0.3–1.2)
Total Protein: 7.6 g/dL (ref 6.5–8.1)

## 2017-08-15 LAB — CBC
HEMATOCRIT: 43.5 % (ref 35.0–47.0)
HEMOGLOBIN: 14.9 g/dL (ref 12.0–16.0)
MCH: 30.9 pg (ref 26.0–34.0)
MCHC: 34.2 g/dL (ref 32.0–36.0)
MCV: 90.5 fL (ref 80.0–100.0)
Platelets: 306 10*3/uL (ref 150–440)
RBC: 4.8 MIL/uL (ref 3.80–5.20)
RDW: 12.7 % (ref 11.5–14.5)
WBC: 11.5 10*3/uL — ABNORMAL HIGH (ref 3.6–11.0)

## 2017-08-15 LAB — LIPASE, BLOOD: LIPASE: 26 U/L (ref 11–51)

## 2017-08-15 MED ORDER — DIPHENHYDRAMINE HCL 50 MG/ML IJ SOLN
12.5000 mg | INTRAMUSCULAR | Status: AC
  Administered 2017-08-15: 12.5 mg via INTRAVENOUS
  Filled 2017-08-15: qty 1

## 2017-08-15 MED ORDER — ONDANSETRON 4 MG PO TBDP
4.0000 mg | ORAL_TABLET | Freq: Once | ORAL | Status: AC | PRN
  Administered 2017-08-15: 4 mg via ORAL
  Filled 2017-08-15: qty 1

## 2017-08-15 MED ORDER — SODIUM CHLORIDE 0.9 % IV BOLUS
1000.0000 mL | Freq: Once | INTRAVENOUS | Status: AC
  Administered 2017-08-15: 1000 mL via INTRAVENOUS

## 2017-08-15 MED ORDER — ONDANSETRON 4 MG PO TBDP: ORAL_TABLET | ORAL | 0 refills | Status: DC

## 2017-08-15 MED ORDER — MORPHINE SULFATE (PF) 4 MG/ML IV SOLN
INTRAVENOUS | Status: AC
  Filled 2017-08-15: qty 1

## 2017-08-15 MED ORDER — ONDANSETRON HCL 4 MG/2ML IJ SOLN
INTRAMUSCULAR | Status: AC
  Filled 2017-08-15: qty 2

## 2017-08-15 MED ORDER — ONDANSETRON HCL 4 MG/2ML IJ SOLN
4.0000 mg | Freq: Once | INTRAMUSCULAR | Status: AC
  Administered 2017-08-15: 4 mg via INTRAVENOUS

## 2017-08-15 MED ORDER — SUCRALFATE 1 G PO TABS: 1.0000 g | ORAL_TABLET | Freq: Four times a day (QID) | ORAL | 1 refills | Status: DC | PRN

## 2017-08-15 MED ORDER — MORPHINE SULFATE (PF) 4 MG/ML IV SOLN
4.0000 mg | Freq: Once | INTRAVENOUS | Status: AC
  Administered 2017-08-15: 4 mg via INTRAVENOUS

## 2017-08-15 MED ORDER — HALOPERIDOL LACTATE 5 MG/ML IJ SOLN
2.5000 mg | Freq: Once | INTRAMUSCULAR | Status: AC
  Administered 2017-08-15: 2.5 mg via INTRAVENOUS
  Filled 2017-08-15: qty 1

## 2017-08-15 NOTE — ED Notes (Signed)
Patient transported to Ultrasound 

## 2017-08-15 NOTE — ED Notes (Signed)
Pt appears more comfortable.

## 2017-08-15 NOTE — ED Notes (Signed)
Pt placed on oxygen at 2lpm for pox of 90%. Pt with rebound pox to 93%.

## 2017-08-15 NOTE — ED Notes (Signed)
Report to cassandra, rn. 

## 2017-08-15 NOTE — ED Provider Notes (Signed)
[facility] Emergency Department Provider Note  ____________________________________________   First MD Initiated Contact with Patient 08/15/17 0542     (approximate)  I have reviewed the triage vital signs and the nursing notes.   HISTORY  Chief Complaint Abdominal Pain and Emesis    HPI Julia Brown is a 47 y.o. female with no contributory past medical history except possibly GERD who presents by private vehicle for evaluation of acute onset and severe sharp and aching upper abdominal pain accompanied with multiple episodes of nausea and vomiting.  She presented the same way 1 week ago and was seen in the emergency department and her work-up was generally reassuring with no acute abnormalities identified on labs or CT scan.  The patient is postcholecystectomy about 20 years ago.  She reports that she had acute onset of the symptoms last week and the pain got better while in the emergency department.  She has been doing fine until tonight at approximately 9 PM (about 8 to 9 hours prior to arrival) and at that time she had acute onset severe pain as described above.  She has vomited multiple times.  She denies diarrhea.  She says the pain is more severe than it was the last time.  She cannot tolerate anything by mouth.  She denies fever/chills, chest pain, shortness of breath, and lower abdominal pain.  Nothing in particular makes it better or worse.  She reports that she does have acid reflux.  She denies smoking and she denies any drug use including marijuana.  She does not see a gastroenterologist.  Past Medical History:  Diagnosis Date  . Chicken pox   . Diabetes mellitus   . GERD (gastroesophageal reflux disease)   . Hyperlipidemia   . Hypertension   . UTI (lower urinary tract infection)     Patient Active Problem List   Diagnosis Date Noted  . Palpitations 05/20/2016  . Anxiety state 01/14/2016  . Vitamin D deficiency 05/07/2015  . Essential  hypertension 01/31/2015  . Vulvovaginal candidiasis 11/01/2014  . Muscle spasm 04/26/2014  . Routine general medical examination at a health care facility 08/28/2013  . Adjustment disorder with mixed anxiety and depressed mood 08/28/2013  . Tobacco abuse 08/28/2013  . Obesity (BMI 30-39.9) 08/28/2013  . GERD (gastroesophageal reflux disease) 08/12/2012  . Hair loss 08/12/2012  . Hyperlipidemia 11/10/2010  . Diabetes type 2, controlled (HCC) 11/10/2010    Past Surgical History:  Procedure Laterality Date  . ABLATION  2007   excessive bleeding  . CESAREAN SECTION  1994  . CHOLECYSTECTOMY  1993  . ORIF FEMUR FRACTURE Right    [place]  . TUBAL LIGATION    . uterine ablation      Prior to Admission medications   Medication Sig Start Date End Date Taking? Authorizing Provider  ACCU-CHEK FASTCLIX LANCETS MISC  01/19/17  Yes [provider]  ACCU-CHEK GUIDE test strip  01/19/17  Yes [provider]  aspirin EC 81 MG tablet Take 1 tablet (81 mg total) by mouth daily. 06/10/16  Yes Almond Lint, MD  BIOTIN PO Take by mouth.   Yes [provider]  canagliflozin (INVOKANA) 300 MG TABS tablet TAKE 1 TABLET BY MOUTH DAILY BEFORE BREAKFAST 01/05/17  Yes Allegra Grana, FNP  carvedilol (COREG) 3.125 MG tablet TAKE 1 TABLET BY MOUTH TWICE DAILY WITH A MEAL 01/05/17  Yes Allegra Grana, FNP  ibuprofen (ADVIL,MOTRIN) 800 MG tablet  02/03/17  Yes [provider]  Insulin Glargine (LANTUS SOLOSTAR) 100 UNIT/ML Solostar Pen Inject 40 Units into the skin daily. 01/05/17  Yes Allegra Grana, FNP  Insulin Pen Needle (UNIFINE PENTIPS) 31G X 5 MM MISC USE AS DIRECTED 01/05/17  Yes Arnett, Lyn Records, FNP  liraglutide (VICTOZA) 18 MG/3ML SOPN INJECT 0.3 MLS (1.8 MG TOTAL) INTO THE SKIN DAILY. 01/05/17  Yes Allegra Grana, FNP  meloxicam (MOBIC) 7.5 MG tablet Take 1 tablet (7.5 mg total) by mouth daily. With food 07/02/17  Yes Arnett, Lyn Records, FNP    metFORMIN (GLUCOPHAGE) 500 MG tablet Take 1 tablet (500 mg total) by mouth daily with breakfast. 01/05/17  Yes Arnett, Lyn Records, FNP  metoCLOPramide (REGLAN) 10 MG tablet Take 1 tablet (10 mg total) by mouth every 8 (eight) hours as needed. 08/08/17  Yes Rebecka Apley, MD  quinapril (ACCUPRIL) 20 MG tablet Take 1 tablet (20 mg total) by mouth at bedtime. 01/05/17  Yes Allegra Grana, FNP  traMADol (ULTRAM) 50 MG tablet Take 1 tablet (50 mg total) by mouth every 6 (six) hours as needed. 08/08/17  Yes Rebecka Apley, MD  VITAMIN D, ERGOCALCIFEROL, PO Take 50,000 Int'l Units by mouth.    Yes [provider]  omeprazole (PRILOSEC) 40 MG capsule Take 1 capsule (40 mg total) by mouth daily. 01/05/17   Allegra Grana, FNP  ondansetron (ZOFRAN ODT) 4 MG disintegrating tablet Allow 1-2 tablets to dissolve in your mouth every 8 hours as needed for nausea/vomiting 08/15/17   Loleta Rose, MD  sucralfate (CARAFATE) 1 g tablet Take 1 tablet (1 g total) by mouth 4 (four) times daily as needed (for abdominal discomfort, nausea, and/or vomiting). 08/15/17   Loleta Rose, MD    Allergies Statins and Penicillins  Family History  Problem Relation Age of Onset  . Heart disease Father   . Hyperlipidemia Father   . Diabetes Father   . Hyperlipidemia Mother   . Hypertension Mother   . Schizophrenia Mother   . Depression Mother   . Diabetes Mother   . Heart disease Other   . Cancer Maternal Aunt        breast  . Diabetes Maternal Grandmother   . Diabetes Maternal Grandfather     Social History Social History   Tobacco Use  . Smoking status: Current Every Day Smoker    Packs/day: 0.50    Years: 10.00    Pack years: 5.00    Types: Cigarettes  . Smokeless tobacco: Never Used  . Tobacco comment: smoked < 1 pack a day  Substance Use Topics  . Alcohol use: Yes    Alcohol/week: 0.0 oz    Comment: weekend 3-4drinks  . Drug use: No    Review of Systems Constitutional: No  fever/chills Eyes: No visual changes. ENT: No sore throat. Cardiovascular: Denies chest pain. Respiratory: Denies shortness of breath. Gastrointestinal: Abdominal pain with nausea and vomiting as described above Genitourinary: Negative for dysuria. Musculoskeletal: Negative for neck pain.  Negative for back pain. Integumentary: Negative for rash. Neurological: Negative for headaches, focal weakness or numbness.   ____________________________________________   PHYSICAL EXAM:  VITAL SIGNS: ED Triage Vitals  Enc Vitals Group     BP 08/15/17 0144 (!) 163/98     Pulse Rate 08/15/17 0144 86     Resp 08/15/17 0144 16     Temp 08/15/17 0144 98.2 F (36.8 C)     Temp Source 08/15/17 0144 Oral     SpO2 08/15/17 0144 100 %  Weight 08/15/17 0144 108 kg (238 lb)     Height 08/15/17 0144 1.778 m ( )     Head Circumference --      Peak Flow --      Pain Score 08/15/17 0158 4     Pain Loc --      Pain Edu? --      Excl. in GC? --     Constitutional: Alert and oriented.  Appears to be in moderate distress due to pain and nausea Eyes: Conjunctivae are normal.  Head: Atraumatic. Nose: No congestion/rhinnorhea. Mouth/Throat: Mucous membranes are moist. Neck: No stridor.  No meningeal signs.   Cardiovascular: Normal rate, regular rhythm. Good peripheral circulation. Grossly normal heart sounds. Respiratory: Normal respiratory effort.  No retractions. Lungs CTAB. Gastrointestinal: Soft with mild tenderness to palpation of the epigastrium and right upper quadrant.  No lower abdominal tenderness.  Nondistended.  Patient reports that palpation of her abdomen greatly worsened the nausea. Musculoskeletal: No lower extremity tenderness nor edema. No gross deformities of extremities. Neurologic:  Normal speech and language. No gross focal neurologic deficits are appreciated.  Skin:  Skin is warm, dry and intact. No rash noted. Psychiatric: Mood and affect are anxious but likely  appropriate under the circumstances with her current complaints.  ____________________________________________   LABS (all labs ordered are listed, but only abnormal results are displayed)  Labs Reviewed  COMPREHENSIVE METABOLIC PANEL - Abnormal; Notable for the following components:      Result Value   Glucose, Bld 141 (*)    All other components within normal limits  CBC - Abnormal; Notable for the following components:   WBC 11.5 (*)    All other components within normal limits  LIPASE, BLOOD   ____________________________________________  EKG  ED ECG REPORT I, Loleta Rose, the attending physician, personally viewed and interpreted this ECG.  Date: 08/15/2017 EKG Time: 5:24 AM Rate: 94 Rhythm: normal sinus rhythm QRS Axis: normal Intervals: normal ST/T Wave abnormalities: Non-specific ST segment / T-wave changes, but no evidence of acute ischemia. Narrative Interpretation: no evidence of acute ischemia   ____________________________________________  RADIOLOGY   ED MD interpretation: No indication of acute abnormality on her right upper quadrant ultrasound, consistent with prior cholecystectomy  Official radiology report(s): US Abdomen Limited Ruq  Result Date: 08/15/2017 CLINICAL DATA:  Right upper quadrant pain, nausea, and vomiting. Postoperative cholecystectomy. EXAM: ULTRASOUND ABDOMEN LIMITED RIGHT UPPER QUADRANT COMPARISON:  CT abdomen and pelvis 08/08/2017 FINDINGS: Gallbladder: Gallbladder is surgically absent. Common bile duct: Diameter: 5.2 mm, normal Liver: No focal lesion identified. Within normal limits in parenchymal echogenicity. Portal vein is patent on color Doppler imaging with normal direction of blood flow towards the liver. IMPRESSION: Surgical absence of the gallbladder. No bile duct dilatation. No focal liver lesions. Electronically Signed   By: Burman Nieves M.D.   On: 08/15/2017 06:31     ____________________________________________   PROCEDURES  Critical Care performed: No   Procedure(s) performed:   Procedures   ____________________________________________   INITIAL IMPRESSION / ASSESSMENT AND PLAN / ED COURSE  As part of my medical decision making, I reviewed the following data within the electronic MEDICAL RECORD NUMBER Nursing notes reviewed and incorporated, Labs reviewed , Old chart reviewed and Notes from prior ED visits    Differential diagnosis includes, but is not limited to, gastritis, cyclic vomiting syndrome, cannabinoid hyperemesis syndrome, ulcer, viral illness, esophagitis, choledocholithiasis secondary to a recently developed hepatic stone in the common bile duct.  The patient's vital signs  are stable but she is obviously in a significant amount of discomfort.  She received morphine 4 mg IV and Zofran 4 mg IV as well as 1 L normal saline by IV prior to my evaluation, but the patient is still tremulous and reporting severe nausea and persistent pain.  I had a conversation with her in private to discuss the possibility of cannabinoid hyperemesis syndrome but she says she does not use any marijuana.  Her abdominal exam is actually reassuring because even in spite of her symptoms and current discomfort she has only very minimal tenderness to palpation of the abdomen.  Given the number of years since her cholecystectomy it is possible she has developed a hepatic stone which is causing choledocholithiasis.  However I think it is most likely she is suffering from acute gastritis of unknown or nonspecific cause.  Her lab work is reassuring with a very mild leukocytosis similar to last week and normal CMP and lipase.  For her refractory nausea and vomiting and cyclic vomiting-like symptoms, I will treat with Haldol 2.5 mg IV and Benadryl 12.5 mg IV.  I am obtaining a right upper quadrant ultrasound to look for ductal dilatation or any evidence of stone.  She  understands and agrees with the plan.  Clinical Course as of Aug 15 953  Sun Aug 15, 2017  5784 Complete resolution of symptoms, patient states she feels "100% better".  Will d/c with plans for outpatient GI follow up and Rx for carafate and Zofran.     [CF]  0745   I gave my usual and customary return precautions.    [CF]    Clinical Course User Index [CF] Loleta Rose, MD    ____________________________________________  FINAL CLINICAL IMPRESSION(S) / ED DIAGNOSES  Final diagnoses:  Acute gastritis, presence of bleeding unspecified, unspecified gastritis type     MEDICATIONS GIVEN DURING THIS VISIT:  Medications  ondansetron (ZOFRAN-ODT) disintegrating tablet 4 mg (4 mg Oral Given 08/15/17 0206)  ondansetron (ZOFRAN) injection 4 mg (4 mg Intravenous Given 08/15/17 0519)  morphine 4 MG/ML injection 4 mg (4 mg Intravenous Given 08/15/17 0519)  sodium chloride 0.9 % bolus 1,000 mL (0 mLs Intravenous Stopped 08/15/17 0740)  diphenhydrAMINE (BENADRYL) injection 12.5 mg (12.5 mg Intravenous Given 08/15/17 0559)  haloperidol lactate (HALDOL) injection 2.5 mg (2.5 mg Intravenous Given 08/15/17 0559)     ED Discharge Orders        Ordered    sucralfate (CARAFATE) 1 g tablet  4 times daily PRN     08/15/17 0746    ondansetron (ZOFRAN ODT) 4 MG disintegrating tablet     08/15/17 0746       Note:  This document was prepared using Dragon voice recognition software and may include unintentional dictation errors.    Loleta Rose, MD 08/15/17 [phone]

## 2017-08-15 NOTE — ED Triage Notes (Signed)
Pt presents with c/o right upper quadrant pain since 9pm with vomiting that started soon after; pt now just vomiting up green/yellow liquid; pt seen here for same last weekend and was told she was dehydrated; prescribed Ultram and Reglan; took Reglan at 9pm and 1am with no relief; pt unable to sit still in wheelchair

## 2017-08-15 NOTE — Discharge Instructions (Signed)
Your work-up was generally reassuring today, but given that this is the second time you have had similar episodes, we strongly encourage you to call the office of Dr. Tobi Bastos and schedule the next available follow-up appointment with a gastroenterologist.  You may need an upper endoscopy or other further evaluation of your esophagus and stomach to determine why you continue to have these episodes.  Try sticking with a bland diet, avoid eating within a couple of hours before going to bed, avoid alcohol and other nonprescription substances, and follow-up with your doctor at the next available opportunity.  Return to the emergency department if you develop new or worsening symptoms that concern you.

## 2017-08-18 ENCOUNTER — Ambulatory Visit: Payer: Self-pay | Admitting: Pharmacy Technician

## 2017-08-18 DIAGNOSIS — Z79899 Other long term (current) drug therapy: Secondary | ICD-10-CM

## 2017-08-18 NOTE — Progress Notes (Signed)
Met with patient completed financial assistance application for [place] due to recent ED visit and seeing provider at [facility].  Patient agreed to be responsible for gathering financial information and forwarding to appropriate department in [facility].    Completed [facility] application and contract.  Patient agreed to all terms of the [facility] contract.    Patient approved to receive medication assistance at [facility] through 2019, as long as eligibility criteria continues to be met.    Provided patient with community resource material based on her particular needs.    Referred patient for MTM and [facility] and [facility] for eye care.  Lantus, Invokana & Victoza Prescription Applications completed with patient.  Forwarded to [name], NP-[place] for [employer].  Upon receipt of signed applications from provider, Lantus Prescription Application will be submitted to [employer], Anastasio Auerbach will be submitted to [place] for [employer].  [facility] [facility]

## 2017-08-19 ENCOUNTER — Telehealth: Payer: Self-pay | Admitting: Family

## 2017-08-19 NOTE — Telephone Encounter (Signed)
Placed in your folder for signature 

## 2017-08-19 NOTE — Telephone Encounter (Signed)
Med management dropped off forms to be completed for pt.. Placed in Arnetts color folder upfront.  Please advise Rhetta Mura when done at [phone]

## 2017-08-24 NOTE — Telephone Encounter (Signed)
Forms completed and placed up front for pick up, left voicemail for [employer] forms ready.

## 2017-08-30 ENCOUNTER — Other Ambulatory Visit: Payer: Self-pay | Admitting: Family

## 2017-09-08 ENCOUNTER — Other Ambulatory Visit: Payer: Self-pay | Admitting: Family

## 2017-09-10 ENCOUNTER — Other Ambulatory Visit: Payer: Self-pay

## 2017-09-10 ENCOUNTER — Ambulatory Visit (INDEPENDENT_AMBULATORY_CARE_PROVIDER_SITE_OTHER): Payer: Self-pay | Admitting: Family

## 2017-09-10 ENCOUNTER — Encounter: Payer: Self-pay | Admitting: Family

## 2017-09-10 VITALS — BP 140/88 | HR 81 | Temp 97.8°F | Wt 234.0 lb

## 2017-09-10 DIAGNOSIS — I10 Essential (primary) hypertension: Secondary | ICD-10-CM

## 2017-09-10 DIAGNOSIS — E119 Type 2 diabetes mellitus without complications: Secondary | ICD-10-CM
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
## 2017-09-11 ENCOUNTER — Encounter: Payer: Self-pay | Admitting: Family
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
## 2017-09-11 NOTE — Assessment & Plan Note (Addendum)
Suspect controlled. Concerned as patient has had 2 episodes of hypoglycemia in the past week.  She does report that she increased her Lantus because she did not have Invokana.  She is well aware of the signs and symptoms of hypoglycemia we discussed next taking meals and ensure proper protein carbohydrate intake.  We jointly agreed this time we will go ahead and decrease Lantus to 35 units.  Patient will stay very vigilant and let me know of any recurrent episodes. For now, I asked her to hold Invokana until we ensure no further hypoglycemic episodes. Pending A1C.
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
## 2017-09-28 ENCOUNTER — Other Ambulatory Visit (INDEPENDENT_AMBULATORY_CARE_PROVIDER_SITE_OTHER): Payer: Self-pay

## 2017-09-28 DIAGNOSIS — E119 Type 2 diabetes mellitus without complications: Secondary | ICD-10-CM

## 2017-09-28 DIAGNOSIS — I10 Essential (primary) hypertension: Secondary | ICD-10-CM

## 2017-09-28 LAB — BASIC METABOLIC PANEL
BUN: 14 mg/dL (ref 6–23)
CHLORIDE: 100 meq/L (ref 96–112)
CO2: 28 mEq/L (ref 19–32)
CREATININE: 0.68 mg/dL (ref 0.40–1.20)
Calcium: 9.2 mg/dL (ref 8.4–10.5)
GFR: 119.62 mL/min (ref >60.00)
Glucose, Bld: 175 mg/dL — ABNORMAL HIGH (ref 70–99)
Potassium: 4.4 mEq/L (ref 3.5–5.1)
Sodium: 134 mEq/L — ABNORMAL LOW (ref 135–145)

## 2017-09-28 LAB — HEMOGLOBIN A1C: HEMOGLOBIN A1C: 7.5 % — AB (ref 4.6–6.5)

## 2017-09-29 NOTE — Telephone Encounter (Signed)
This encounter was created in error - please disregard.

## 2017-10-01 NOTE — Telephone Encounter (Signed)
Call pt  She may increase lantus by one unit every 5 days until fasting blood sugars approach less than 120. Be very mindful of low blood sugars.   I would hold the victoza, invokana for now. We can address starting those during follow up visit.   Mainly I just want to get her sugar under better control and we can simply stick with metformin and lantus.   We can also discuss increasing metformin as another option  I would like her to see our pharmacist regarding DM management- please schedule an appt with her ( believe [facility] CitizenCaroline has stopped and it is new pharmacist)

## 2017-10-04 NOTE — Telephone Encounter (Signed)
Left voice mail for patient to call back ok for P[facility] to speak to patient    

## 2017-11-04 ENCOUNTER — Encounter: Payer: Self-pay | Admitting: Pharmacist

## 2017-11-04 ENCOUNTER — Telehealth: Payer: Self-pay | Admitting: Pharmacy Technician

## 2017-11-04 NOTE — Telephone Encounter (Signed)
Patient stated that she now has [facility]  Patient understands that she is no longer eligible to receive medication assistance at [employer].  Julia DacostaBetty J. Navraj Brown Care Manager [facility]

## 2017-11-05 ENCOUNTER — Encounter: Payer: Self-pay | Admitting: Family

## 2017-11-05 NOTE — Telephone Encounter (Signed)
See mychart response  for 11/05/17

## 2017-11-07 ENCOUNTER — Other Ambulatory Visit: Payer: Self-pay | Admitting: Family

## 2017-11-07 MED ORDER — FLUCONAZOLE 150 MG PO TABS: ORAL_TABLET | ORAL | 1 refills | Status: DC

## 2017-11-18 ENCOUNTER — Encounter: Payer: Self-pay | Admitting: Family

## 2017-11-26 ENCOUNTER — Other Ambulatory Visit: Payer: Self-pay | Admitting: Family

## 2017-11-26 DIAGNOSIS — E119 Type 2 diabetes mellitus without complications: Secondary | ICD-10-CM

## 2017-11-26 MED ORDER — CANAGLIFLOZIN 300 MG PO TABS
ORAL_TABLET | ORAL | 3 refills | Status: DC
Start: 2017-11-26 — End: 2017-12-03

## 2017-11-26 NOTE — Telephone Encounter (Signed)
Will you send script for Invokana to pharmacy please . Patient now has insurance in order for me to do prior authorization.  I have been awaiting prior authorization from insurance company. Thanks

## 2017-12-03 ENCOUNTER — Other Ambulatory Visit: Payer: Self-pay

## 2017-12-03 DIAGNOSIS — E119 Type 2 diabetes mellitus without complications: Secondary | ICD-10-CM

## 2017-12-03 MED ORDER — CANAGLIFLOZIN 300 MG PO TABS: ORAL_TABLET | ORAL | 1 refills | Status: DC

## 2017-12-13 ENCOUNTER — Ambulatory Visit: Payer: BC Managed Care – PPO | Admitting: Family

## 2017-12-13 ENCOUNTER — Other Ambulatory Visit (HOSPITAL_COMMUNITY)
Admission: RE | Admit: 2017-12-13 | Discharge: 2017-12-13 | Disposition: A | Discharge status: Home / Self Care | Payer: BC Managed Care – PPO | Source: Ambulatory Visit | Attending: Family | Admitting: Family

## 2017-12-13 ENCOUNTER — Ambulatory Visit (INDEPENDENT_AMBULATORY_CARE_PROVIDER_SITE_OTHER): Payer: BC Managed Care – PPO

## 2017-12-13 ENCOUNTER — Encounter: Payer: Self-pay | Admitting: Family

## 2017-12-13 VITALS — BP 128/80 | HR 82 | Temp 98.1°F | Resp 16 | Ht 70.0 in | Wt 251.0 lb

## 2017-12-13 DIAGNOSIS — E119 Type 2 diabetes mellitus without complications: Secondary | ICD-10-CM | POA: Diagnosis not present

## 2017-12-13 DIAGNOSIS — I10 Essential (primary) hypertension: Secondary | ICD-10-CM | POA: Diagnosis not present

## 2017-12-13 DIAGNOSIS — M545 Low back pain, unspecified: Secondary | ICD-10-CM | POA: Insufficient documentation

## 2017-12-13 DIAGNOSIS — G8929 Other chronic pain: Secondary | ICD-10-CM

## 2017-12-13 DIAGNOSIS — N898 Other specified noninflammatory disorders of vagina: Secondary | ICD-10-CM

## 2017-12-13 MED ORDER — METFORMIN HCL 500 MG PO TABS
1000.0000 mg | ORAL_TABLET | Freq: Two times a day (BID) | ORAL | 1 refills | Status: DC
Start: 2017-12-13 — End: 2018-02-16

## 2017-12-13 NOTE — Assessment & Plan Note (Signed)
Controlled. No changes to regimen 

## 2017-12-13 NOTE — Patient Instructions (Addendum)
Increase metformin 1000mg  twice per day.   We will call you with results

## 2017-12-13 NOTE — Assessment & Plan Note (Signed)
Pending Pap smear.

## 2017-12-13 NOTE — Progress Notes (Signed)
Subjective:    Patient ID: Julia Brown, female    DOB: Oct 24, 1970, 47 y.o.   MRN: [id number]  CC: Julia Brown is a 47 y.o. female who presents today for an acute visit.    HPI: Here today for vaginal itching about 6 weeks, unchanged.  Endorses foul smell and odor.No dysuria, low back pain, urinary frequency. Pap UTD. No vaginal bleeding. No longer having menses after ablation by GYN. No spotting.  HTN- compliant with medication.Denies exertional chest pain or pressure, numbness or tingling radiating to left arm or jaw, palpitations, dizziness, frequent headaches, changes in vision, or shortness of breath.    DM- FGD 150-200. not on inokana; on lantus 45 units, metformin 500 qd  Chronic lbp- taking mobic daily with relief. Pain shooting left posterior leg, resolves with stretching. Worse after long periods of sitting. No numbness or falls. No h/o cancer. No groin pain, saddle anesthesia.   Pap utd however HPV testing not included on last pap.   2011- MR cspine  HISTORY:  Past Medical History:  Diagnosis Date  . Chicken pox   . Diabetes mellitus   . GERD (gastroesophageal reflux disease)   . Hyperlipidemia   . Hypertension   . UTI (lower urinary tract infection)    Past Surgical History:  Procedure Laterality Date  . ABLATION  2007   excessive bleeding  . CESAREAN SECTION  1994  . CHOLECYSTECTOMY  1993  . ORIF FEMUR FRACTURE Right    [place]  . TUBAL LIGATION    . uterine ablation     Family History  Problem Relation Age of Onset  . Heart disease Father   . Hyperlipidemia Father   . Diabetes Father   . Hyperlipidemia Mother   . Hypertension Mother   . Schizophrenia Mother   . Depression Mother   . Diabetes Mother   . Heart disease Other   . Cancer Maternal Aunt        breast  . Diabetes Maternal Grandmother   . Diabetes Maternal Grandfather     Allergies: Statins and Penicillins Current Outpatient Medications on File Prior to Visit  Medication Sig  Dispense Refill  . ACCU-CHEK FASTCLIX LANCETS MISC   99  . ACCU-CHEK GUIDE test strip   99  . aspirin EC 81 MG tablet Take 1 tablet (81 mg total) by mouth daily. 90 tablet 3  . canagliflozin (INVOKANA) 300 MG TABS tablet TAKE 1 TABLET BY MOUTH DAILY BEFORE BREAKFAST 90 tablet 1  . carvedilol (COREG) 3.125 MG tablet TAKE 1 TABLET BY MOUTH TWICE DAILY WITH A MEAL 180 tablet 6  . fluconazole (DIFLUCAN) 150 MG tablet Take one tablet PO once. If sxs persist, may take one tablet PO 3 days later. 2 tablet 1  . ibuprofen (ADVIL,MOTRIN) 800 MG tablet   12  . Insulin Glargine (LANTUS SOLOSTAR) 100 UNIT/ML Solostar Pen Inject 40 Units into the skin daily. 5 pen 12  . Insulin Pen Needle (UNIFINE PENTIPS) 31G X 5 MM MISC USE AS DIRECTED 200 each 6  . meloxicam (MOBIC) 7.5 MG tablet Take 1 tablet (7.5 mg total) by mouth daily. With food 90 tablet 1  . omeprazole (PRILOSEC) 40 MG capsule TAKE 1 CAPSULE BY MOUTH ONCE DAILY 30 capsule 6  . ondansetron (ZOFRAN ODT) 4 MG disintegrating tablet Allow 1-2 tablets to dissolve in your mouth every 8 hours as needed for nausea/vomiting 30 tablet 0  . quinapril (ACCUPRIL) 20 MG tablet TAKE 1 TABLET BY MOUTH  AT BEDTIME 30 tablet 3  . traMADol (ULTRAM) 50 MG tablet Take 1 tablet (50 mg total) by mouth every 6 (six) hours as needed. 12 tablet 0  . VITAMIN D, ERGOCALCIFEROL, PO Take 50,000 Int'l Units by mouth.     Marland Kitchen BIOTIN PO Take by mouth.     No current facility-administered medications on file prior to visit.     Social History   Tobacco Use  . Smoking status: Current Every Day Smoker    Packs/day: 0.50    Years: 10.00    Pack years: 5.00    Types: Cigarettes  . Smokeless tobacco: Never Used  . Tobacco comment: smoked < 1 pack a day  Substance Use Topics  . Alcohol use: Yes    Alcohol/week: 0.0 standard drinks    Comment: weekend 3-4drinks  . Drug use: No    Review of Systems  Constitutional: Negative for chills and fever.  Respiratory: Negative for  cough.   Cardiovascular: Negative for chest pain and palpitations.  Gastrointestinal: Negative for nausea and vomiting.  Genitourinary: Positive for vaginal discharge. Negative for dysuria and vaginal bleeding.  Musculoskeletal: Positive for back pain.  Neurological: Negative for numbness.      Objective:    BP 128/80 (BP Location: Left Arm, Patient Position: Sitting, Cuff Size: Large)   Pulse 82   Resp 16   Wt 251 lb (113.9 kg)   SpO2 98%   BMI 36.01 kg/m    Physical Exam  Constitutional: She appears well-developed and well-nourished.  Eyes: Conjunctivae are normal.  Cardiovascular: Normal rate, regular rhythm, normal heart sounds and normal pulses.  Pulmonary/Chest: Effort normal and breath sounds normal. She has no wheezes. She has no rhonchi. She has no rales.  Abdominal: There is no CVA tenderness.  Genitourinary: There is no rash, tenderness or lesion on the right labia. There is no rash, tenderness or lesion on the left labia. Cervix exhibits no motion tenderness and no discharge. Right adnexum displays no mass, no tenderness and no fullness. Left adnexum displays no mass, no tenderness and no fullness. No erythema, tenderness or bleeding in the vagina. No foreign body in the vagina. No vaginal discharge found.  Genitourinary Comments: No vulvovaginal erythema. No lesions. Discharge is thick and white, not purulent.  Pap performed  Musculoskeletal:       Lumbar back: She exhibits pain. She exhibits normal range of motion, no tenderness, no bony tenderness, no swelling, no edema and no spasm.       Back:  Full range of motion with flexion, tension, lateral side bends. No bony tenderness. No pain, numbness, tingling elicited with single leg raise bilaterally.   Neurological: She is alert. She has normal strength. No sensory deficit.  Reflex Scores:      Patellar reflexes are 2+ on the right side and 2+ on the left side. Sensation and strength intact bilateral lower  extremities.  Skin: Skin is warm and dry.  Psychiatric: She has a normal mood and affect. Her speech is normal and behavior is normal. Thought content normal.  Vitals reviewed.      Assessment & Plan:   Problem List Items Addressed This Visit      Cardiovascular and Mediastinum   Essential hypertension (Chronic)    Controlled.  No changes to regimen.        Endocrine   Diabetes type 2, controlled (HCC) (Chronic)    Uncontrolled.  Increase metformin.  Follow-up next month as scheduled.  Relevant Medications   metFORMIN (GLUCOPHAGE) 500 MG tablet     Musculoskeletal and Integument   Vaginal itching - Primary    Pending Pap smear.       Relevant Orders   Cytology - PAP     Other   Chronic left-sided low back pain    Chronic.  Some response with Mobic.  Advised I would prefer her to not be on this medication long-term.  Pending x-ray, referral to physical medicine      Relevant Orders   Ambulatory referral to Orthopedic Surgery   DG Lumbar Spine Complete        I have discontinued Shonita T. Sachdeva's metoCLOPramide. I have also changed her metFORMIN. Additionally, I am having her maintain her (VITAMIN D, ERGOCALCIFEROL, PO), aspirin EC, carvedilol, Insulin Glargine, Insulin Pen Needle, ACCU-CHEK GUIDE, ibuprofen, ACCU-CHEK FASTCLIX LANCETS, BIOTIN PO, meloxicam, traMADol, ondansetron, quinapril, omeprazole, fluconazole, and canagliflozin.   Meds ordered this encounter  Medications  . metFORMIN (GLUCOPHAGE) 500 MG tablet    Sig: Take 2 tablets (1,000 mg total) by mouth 2 (two) times daily with a meal.    Dispense:  270 tablet    Refill:  1    Order Specific Question:   Supervising Provider    Answer:   Sherlene ShamsULLO, TERESA L [[id number]]    Return precautions given.   Risks, benefits, and alternatives of the medications and treatment plan prescribed today were discussed, and patient expressed understanding.   Education regarding symptom management and diagnosis given  to patient on AVS.  Continue to follow with [name], Jackilyn Umphlett G, FNP for routine health maintenance.   Charlotte CrumbMonique T Morre and I agreed with plan.   Rennie PlowmanMargaret Allia Wiltsey, FNP

## 2017-12-13 NOTE — Assessment & Plan Note (Signed)
Chronic.  Some response with Mobic.  Advised I would prefer her to not be on this medication long-term.  Pending x-ray, referral to physical medicine

## 2017-12-13 NOTE — Assessment & Plan Note (Signed)
Uncontrolled.  Increase metformin.  Follow-up next month as scheduled.

## 2017-12-14 ENCOUNTER — Encounter: Payer: Self-pay | Admitting: Family

## 2017-12-14 LAB — CYTOLOGY - PAP
BACTERIAL VAGINITIS: NEGATIVE
CHLAMYDIA, DNA PROBE: NEGATIVE
Candida vaginitis: NEGATIVE
DIAGNOSIS: NEGATIVE
HPV: NOT DETECTED
NEISSERIA GONORRHEA: NEGATIVE
Trichomonas: NEGATIVE

## 2017-12-24 ENCOUNTER — Encounter: Payer: Self-pay | Admitting: Family

## 2017-12-27 ENCOUNTER — Other Ambulatory Visit: Payer: Self-pay | Admitting: Family

## 2017-12-27 DIAGNOSIS — E119 Type 2 diabetes mellitus without complications: Secondary | ICD-10-CM

## 2017-12-27 NOTE — Progress Notes (Signed)
close

## 2017-12-27 NOTE — Progress Notes (Deleted)
Subjective:    Patient ID: HEYLI MIN, female    DOB: Mar 28, 1970, 47 y.o.   MRN: [id number]  CC: ESHAL PROPPS is a 47 y.o. female who presents today for follow up.   HPI: HLD- to start statin after labs     HISTORY:  Past Medical History:  Diagnosis Date  . Chicken pox   . Diabetes mellitus   . GERD (gastroesophageal reflux disease)   . Hyperlipidemia   . Hypertension   . UTI (lower urinary tract infection)    Past Surgical History:  Procedure Laterality Date  . ABLATION  2007   excessive bleeding  . CESAREAN SECTION  1994  . CHOLECYSTECTOMY  1993  . ORIF FEMUR FRACTURE Right    [place]  . TUBAL LIGATION    . uterine ablation     Family History  Problem Relation Age of Onset  . Heart disease Father   . Hyperlipidemia Father   . Diabetes Father   . Hyperlipidemia Mother   . Hypertension Mother   . Schizophrenia Mother   . Depression Mother   . Diabetes Mother   . Heart disease Other   . Cancer Maternal Aunt        breast  . Diabetes Maternal Grandmother   . Diabetes Maternal Grandfather     Allergies: Statins and Penicillins Current Outpatient Medications on File Prior to Visit  Medication Sig Dispense Refill  . ACCU-CHEK FASTCLIX LANCETS MISC   99  . ACCU-CHEK GUIDE test strip   99  . aspirin EC 81 MG tablet Take 1 tablet (81 mg total) by mouth daily. 90 tablet 3  . BIOTIN PO Take by mouth.    . canagliflozin (INVOKANA) 300 MG TABS tablet TAKE 1 TABLET BY MOUTH DAILY BEFORE BREAKFAST 90 tablet 1  . carvedilol (COREG) 3.125 MG tablet TAKE 1 TABLET BY MOUTH TWICE DAILY WITH A MEAL 180 tablet 6  . fluconazole (DIFLUCAN) 150 MG tablet Take one tablet PO once. If sxs persist, may take one tablet PO 3 days later. 2 tablet 1  . ibuprofen (ADVIL,MOTRIN) 800 MG tablet   12  . Insulin Glargine (LANTUS SOLOSTAR) 100 UNIT/ML Solostar Pen Inject 40 Units into the skin daily. 5 pen 12  . Insulin Pen Needle (UNIFINE PENTIPS) 31G X 5 MM MISC USE AS DIRECTED  200 each 6  . meloxicam (MOBIC) 7.5 MG tablet Take 1 tablet (7.5 mg total) by mouth daily. With food 90 tablet 1  . metFORMIN (GLUCOPHAGE) 500 MG tablet Take 2 tablets (1,000 mg total) by mouth 2 (two) times daily with a meal. 270 tablet 1  . omeprazole (PRILOSEC) 40 MG capsule TAKE 1 CAPSULE BY MOUTH ONCE DAILY 30 capsule 6  . ondansetron (ZOFRAN ODT) 4 MG disintegrating tablet Allow 1-2 tablets to dissolve in your mouth every 8 hours as needed for nausea/vomiting 30 tablet 0  . quinapril (ACCUPRIL) 20 MG tablet TAKE 1 TABLET BY MOUTH AT BEDTIME 30 tablet 3  . traMADol (ULTRAM) 50 MG tablet Take 1 tablet (50 mg total) by mouth every 6 (six) hours as needed. 12 tablet 0  . VITAMIN D, ERGOCALCIFEROL, PO Take 50,000 Int'l Units by mouth.      No current facility-administered medications on file prior to visit.     Social History   Tobacco Use  . Smoking status: Current Every Day Smoker    Packs/day: 0.50    Years: 10.00    Pack years: 5.00  Types: Cigarettes  . Smokeless tobacco: Never Used  . Tobacco comment: smoked < 1 pack a day  Substance Use Topics  . Alcohol use: Yes    Alcohol/week: 0.0 standard drinks    Comment: weekend 3-4drinks  . Drug use: No    Review of Systems    Objective:    There were no vitals taken for this visit. BP Readings from Last 3 Encounters:  12/13/17 128/80  09/10/17 140/88  08/15/17 105/76   Wt Readings from Last 3 Encounters:  12/13/17 251 lb (113.9 kg)  09/10/17 234 lb (106.1 kg)  08/15/17 238 lb (108 kg)    Physical Exam     Assessment & Plan:   Problem List Items Addressed This Visit    None       I am having [name] maintain her (VITAMIN D, ERGOCALCIFEROL, PO), aspirin EC, carvedilol, Insulin Glargine, Insulin Pen Needle, ACCU-CHEK GUIDE, ibuprofen, ACCU-CHEK FASTCLIX LANCETS, BIOTIN PO, meloxicam, traMADol, ondansetron, quinapril, omeprazole, fluconazole, canagliflozin, and metFORMIN.   No orders of the defined  types were placed in this encounter.   Return precautions given.   Risks, benefits, and alternatives of the medications and treatment plan prescribed today were discussed, and patient expressed understanding.   Education regarding symptom management and diagnosis given to patient on AVS.  Continue to follow with Allegra Grana, FNP for routine health maintenance.   [name] and I agreed with plan.   Rennie Plowman, FNP

## 2017-12-31 ENCOUNTER — Ambulatory Visit: Payer: Self-pay | Admitting: Family

## 2017-12-31 DIAGNOSIS — Z0289 Encounter for other administrative examinations: Secondary | ICD-10-CM

## 2018-01-17 ENCOUNTER — Other Ambulatory Visit: Payer: Self-pay | Admitting: Family

## 2018-01-25 NOTE — Telephone Encounter (Signed)
Prior authorization submitted.

## 2018-02-16 ENCOUNTER — Encounter: Payer: Self-pay | Admitting: Family

## 2018-02-16 ENCOUNTER — Ambulatory Visit: Payer: BC Managed Care – PPO | Admitting: Family

## 2018-02-16 VITALS — BP 140/82 | HR 72 | Temp 97.8°F | Wt 256.8 lb

## 2018-02-16 DIAGNOSIS — I10 Essential (primary) hypertension: Secondary | ICD-10-CM | POA: Diagnosis not present

## 2018-02-16 DIAGNOSIS — E559 Vitamin D deficiency, unspecified: Secondary | ICD-10-CM | POA: Diagnosis not present

## 2018-02-16 DIAGNOSIS — E119 Type 2 diabetes mellitus without complications: Secondary | ICD-10-CM

## 2018-02-16 DIAGNOSIS — N898 Other specified noninflammatory disorders of vagina: Secondary | ICD-10-CM

## 2018-02-16 MED ORDER — QUINAPRIL HCL 40 MG PO TABS: 40.0000 mg | ORAL_TABLET | Freq: Every day | ORAL | 1 refills | Status: DC

## 2018-02-16 MED ORDER — METFORMIN HCL ER (MOD) 1000 MG PO TB24: 1000.0000 mg | ORAL_TABLET | Freq: Every day | ORAL | 3 refills | Status: DC

## 2018-02-16 NOTE — Progress Notes (Signed)
Subjective:    Patient ID: Julia Brown, female    DOB: [birth year], 47 y.o.   MRN: [id number]  CC: Julia Brown is a 47 y.o. female who presents today for follow up.   HPI: Feels well. No complaints.   HTN- compliant with medication. No cp.  DM- FBG 120- 150. Highest 199.  not on invokana as insurance will not approve. Metformin 500mg  qpm. ON lantus 35 units. No hypoglycemia.  Has gained weight.   Vitamin d deficient- has been taking mega dose.     HISTORY:  Past Medical History:  Diagnosis Date  . Chicken pox   . Diabetes mellitus   . GERD (gastroesophageal reflux disease)   . Hyperlipidemia   . Hypertension   . UTI (lower urinary tract infection)    Past Surgical History:  Procedure Laterality Date  . ABLATION  2007   excessive bleeding  . CESAREAN SECTION  1994  . CHOLECYSTECTOMY  1993  . ORIF FEMUR FRACTURE Right    [place]  . TUBAL LIGATION    . uterine ablation     Family History  Problem Relation Age of Onset  . Heart disease Father   . Hyperlipidemia Father   . Diabetes Father   . Hyperlipidemia Mother   . Hypertension Mother   . Schizophrenia Mother   . Depression Mother   . Diabetes Mother   . Heart disease Other   . Cancer Maternal Aunt        breast  . Diabetes Maternal Grandmother   . Diabetes Maternal Grandfather     Allergies: Statins and Penicillins Current Outpatient Medications on File Prior to Visit  Medication Sig Dispense Refill  . ACCU-CHEK FASTCLIX LANCETS MISC   99  . ACCU-CHEK GUIDE test strip   99  . amLODipine (NORVASC) 5 MG tablet Take 1 tablet (5 mg total) by mouth daily. 30 tablet 0  . aspirin EC 81 MG tablet Take 1 tablet (81 mg total) by mouth daily. 90 tablet 3  . BIOTIN PO Take by mouth.    . carvedilol (COREG) 3.125 MG tablet TAKE 1 TABLET BY MOUTH TWICE DAILY WITH A MEAL 180 tablet 6  . fluconazole (DIFLUCAN) 150 MG tablet Take one tablet PO once. If sxs persist, may take one tablet PO 3 days later. 2 tablet  1  . ibuprofen (ADVIL,MOTRIN) 800 MG tablet   12  . Insulin Glargine (LANTUS SOLOSTAR) 100 UNIT/ML Solostar Pen Inject 40 Units into the skin daily. 5 pen 12  . Insulin Pen Needle (UNIFINE PENTIPS) 31G X 5 MM MISC USE AS DIRECTED 200 each 6  . meloxicam (MOBIC) 7.5 MG tablet Take 1 tablet (7.5 mg total) by mouth daily. 30 tablet 0  . omeprazole (PRILOSEC) 40 MG capsule TAKE 1 CAPSULE BY MOUTH ONCE DAILY 30 capsule 6  . VITAMIN D, ERGOCALCIFEROL, PO Take 50,000 Int'l Units by mouth.      No current facility-administered medications on file prior to visit.     Social History   Tobacco Use  . Smoking status: Current Every Day Smoker    Packs/day: 0.50    Years: 10.00    Pack years: 5.00    Types: Cigarettes  . Smokeless tobacco: Never Used  . Tobacco comment: smoked < 1 pack a day  Substance Use Topics  . Alcohol use: Yes    Alcohol/week: 0.0 standard drinks    Comment: weekend 3-4drinks  . Drug use: No    Review of  Systems  Constitutional: Negative for chills and fever.  Respiratory: Negative for cough and shortness of breath.   Cardiovascular: Negative for chest pain and palpitations.  Gastrointestinal: Negative for nausea and vomiting.      Objective:    BP 140/82 (BP Location: Right Arm, Patient Position: Sitting, Cuff Size: Normal)   Pulse 72   Temp 97.8 F (36.6 C)   Wt 256 lb 12.8 oz (116.5 kg)   SpO2 98%   BMI 36.85 kg/m  BP Readings from Last 3 Encounters:  02/16/18 140/82  12/13/17 128/80  09/10/17 140/88   Wt Readings from Last 3 Encounters:  02/16/18 256 lb 12.8 oz (116.5 kg)  12/13/17 251 lb (113.9 kg)  09/10/17 234 lb (106.1 kg)    Physical Exam  Constitutional: She appears well-developed and well-nourished.  Eyes: Conjunctivae are normal.  Cardiovascular: Normal rate, regular rhythm, normal heart sounds and normal pulses.  Pulmonary/Chest: Effort normal and breath sounds normal. She has no wheezes. She has no rhonchi. She has no rales.    Neurological: She is alert.  Skin: Skin is warm and dry.  Psychiatric: She has a normal mood and affect. Her speech is normal and behavior is normal. Thought content normal.  Vitals reviewed.      Assessment & Plan:   Problem List Items Addressed This Visit      Cardiovascular and Mediastinum   Essential hypertension (Chronic)    Slightly elevated.  Will increase ACE-I. Repeat BMP in 5 days.      Relevant Medications   quinapril (ACCUPRIL) 40 MG tablet   Other Relevant Orders   Basic metabolic panel     Endocrine   Diabetes type 2, controlled (HCC) - Primary (Chronic)    Pending a1c.  Based on fasting blood sugars, went ahead and increased metformin to 1000 mg extended release tablet.      Relevant Medications   metFORMIN (GLUMETZA) 1000 MG (MOD) 24 hr tablet   quinapril (ACCUPRIL) 40 MG tablet     Musculoskeletal and Integument   Vaginal itching    Has persisted.  Patient would like to see GYN, referral placed today.  Will follow      Relevant Orders   Ambulatory referral to Obstetrics / Gynecology     Other   Vitamin D deficiency    Pending lab today.       Relevant Orders   VITAMIN D 25 Hydroxy (Vit-D Deficiency, Fractures)       I have discontinued Kalah T. Hadsall's traMADol, ondansetron, canagliflozin, and metFORMIN. I have also changed her quinapril. Additionally, I am having her start on metFORMIN. Lastly, I am having her maintain her (VITAMIN D, ERGOCALCIFEROL, PO), aspirin EC, carvedilol, Insulin Glargine, Insulin Pen Needle, ACCU-CHEK GUIDE, ibuprofen, ACCU-CHEK FASTCLIX LANCETS, BIOTIN PO, omeprazole, fluconazole, meloxicam, and amLODipine.   Meds ordered this encounter  Medications  . metFORMIN (GLUMETZA) 1000 MG (MOD) 24 hr tablet    Sig: Take 1 tablet (1,000 mg total) by mouth daily with breakfast.    Dispense:  90 tablet    Refill:  3    Order Specific Question:   Supervising Provider    Answer:   Duncan DullULLO, TERESA L [[id number]]  . quinapril  (ACCUPRIL) 40 MG tablet    Sig: Take 1 tablet (40 mg total) by mouth daily.    Dispense:  90 tablet    Refill:  1    Order Specific Question:   Supervising Provider    Answer:   Sherlene ShamsULLO, TERESA L [  [id number]]    Return precautions given.   Risks, benefits, and alternatives of the medications and treatment plan prescribed today were discussed, and patient expressed understanding.   Education regarding symptom management and diagnosis given to patient on AVS.  Continue to follow with Allegra Grana, FNP for routine health maintenance.   [name] and I agreed with plan.   Rennie Plowman, FNP

## 2018-02-16 NOTE — Assessment & Plan Note (Signed)
Slightly elevated.  Will increase ACE-I. Repeat BMP in 5 days.

## 2018-02-16 NOTE — Addendum Note (Signed)
Addended by: Warden FillersWRIGHT, Marenda Accardi S on: 02/16/2018 03:37 PM   Modules accepted: Orders

## 2018-02-16 NOTE — Patient Instructions (Signed)
Increase enalapril from 20 mg once a day to 40 mg once per day.  You need to recheck your labs in approximately 5 days, please make this lab appointment at checkout.  Monitor blood pressure,  Goal is less than 120/80, based on newest guidelines; if persistently higher, please make sooner follow up appointment so we can recheck you blood pressure and manage medications  Happy thanksgiving!

## 2018-02-16 NOTE — Assessment & Plan Note (Signed)
Has persisted.  Patient would like to see GYN, referral placed today.  Will follow

## 2018-02-16 NOTE — Assessment & Plan Note (Signed)
Pending a1c.  Based on fasting blood sugars, went ahead and increased metformin to 1000 mg extended release tablet.

## 2018-02-16 NOTE — Assessment & Plan Note (Signed)
Pending lab today.

## 2018-02-17 LAB — COMPREHENSIVE METABOLIC PANEL
AG RATIO: 1.6 (calc) (ref 1.0–2.5)
ALBUMIN MSPROF: 4.2 g/dL (ref 3.6–5.1)
ALKALINE PHOSPHATASE (APISO): 64 U/L (ref 33–115)
ALT: 14 U/L (ref 6–29)
AST: 13 U/L (ref 10–35)
BILIRUBIN TOTAL: 0.4 mg/dL (ref 0.2–1.2)
BUN: 16 mg/dL (ref 7–25)
CALCIUM: 9.8 mg/dL (ref 8.6–10.2)
CO2: 27 mmol/L (ref 20–32)
Chloride: 100 mmol/L (ref 98–110)
Creat: 0.75 mg/dL (ref 0.50–1.10)
Globulin: 2.7 g/dL (calc) (ref 1.9–3.7)
Glucose, Bld: 170 mg/dL — ABNORMAL HIGH (ref 65–99)
Potassium: 4.2 mmol/L (ref 3.5–5.3)
SODIUM: 136 mmol/L (ref 135–146)
Total Protein: 6.9 g/dL (ref 6.1–8.1)

## 2018-02-17 LAB — VITAMIN D 25 HYDROXY (VIT D DEFICIENCY, FRACTURES): VIT D 25 HYDROXY: 37 ng/mL (ref 30–100)

## 2018-02-17 LAB — HEMOGLOBIN A1C
HEMOGLOBIN A1C: 10.2 %{Hb} — AB (ref <5.7)
Mean Plasma Glucose: 246 (calc)
eAG (mmol/L): 13.6 (calc)

## 2018-02-17 LAB — LIPID PANEL
CHOL/HDL RATIO: 4.6 (calc) (ref <5.0)
CHOLESTEROL: 242 mg/dL — AB (ref <200)
HDL: 53 mg/dL (ref >50)
LDL CHOLESTEROL (CALC): 153 mg/dL — AB
Non-HDL Cholesterol (Calc): 189 mg/dL (calc) — ABNORMAL HIGH (ref <130)
TRIGLYCERIDES: 223 mg/dL — AB (ref <150)

## 2018-02-22 ENCOUNTER — Encounter: Payer: Self-pay | Admitting: Family

## 2018-02-22 ENCOUNTER — Other Ambulatory Visit: Payer: Self-pay | Admitting: Family

## 2018-02-23 ENCOUNTER — Other Ambulatory Visit: Payer: Self-pay | Admitting: Family

## 2018-02-25 ENCOUNTER — Other Ambulatory Visit: Payer: Self-pay

## 2018-02-25 NOTE — Addendum Note (Signed)
Addended by: WIGGINS, Rajanee Schuelke N on: 02/25/2018 01:18 PM   Modules accepted: Orders  

## 2018-02-28 ENCOUNTER — Other Ambulatory Visit: Payer: Self-pay | Admitting: Family

## 2018-02-28 ENCOUNTER — Telehealth: Payer: Self-pay | Admitting: Family

## 2018-02-28 DIAGNOSIS — E119 Type 2 diabetes mellitus without complications: Secondary | ICD-10-CM

## 2018-02-28 MED ORDER — EMPAGLIFLOZIN 10 MG PO TABS: 10.0000 mg | ORAL_TABLET | Freq: Every morning | ORAL | 1 refills | Status: DC

## 2018-02-28 MED ORDER — ROSUVASTATIN CALCIUM 10 MG PO TABS: 10.0000 mg | ORAL_TABLET | Freq: Every day | ORAL | 3 refills | Status: DC

## 2018-02-28 NOTE — Telephone Encounter (Signed)
Copied from CRM [id number]. Topic: Quick Communication - Rx Refill/Question >> Feb 28, 2018  1:24 PM Crist InfanteHarrald, Kathy J wrote: Medication: rosuvastatin (CRESTOR) 10 MG tablet  Pharmacy calling to advise a flag came up on this med for the pt, all statins give her heart palpitations. Please advise  [facility] [place] [address] [phone] (Phone) [phone] (Fax)

## 2018-03-01 ENCOUNTER — Encounter: Payer: Self-pay | Admitting: Obstetrics and Gynecology

## 2018-03-01 ENCOUNTER — Other Ambulatory Visit: Payer: Self-pay

## 2018-03-01 ENCOUNTER — Ambulatory Visit (INDEPENDENT_AMBULATORY_CARE_PROVIDER_SITE_OTHER): Payer: BC Managed Care – PPO | Admitting: Obstetrics and Gynecology

## 2018-03-01 VITALS — BP 140/90 | HR 96 | Ht 71.0 in | Wt 246.0 lb

## 2018-03-01 DIAGNOSIS — B9689 Other specified bacterial agents as the cause of diseases classified elsewhere: Secondary | ICD-10-CM | POA: Diagnosis not present

## 2018-03-01 DIAGNOSIS — N760 Acute vaginitis: Secondary | ICD-10-CM

## 2018-03-01 DIAGNOSIS — N930 Postcoital and contact bleeding: Secondary | ICD-10-CM | POA: Diagnosis not present

## 2018-03-01 DIAGNOSIS — N898 Other specified noninflammatory disorders of vagina: Secondary | ICD-10-CM

## 2018-03-01 LAB — POCT WET PREP WITH KOH
Clue Cells Wet Prep HPF POC: POSITIVE
KOH Prep POC: POSITIVE — AB
Trichomonas, UA: NEGATIVE
Yeast Wet Prep HPF POC: NEGATIVE

## 2018-03-01 MED ORDER — CLINDAMYCIN PHOSPHATE 2 % VA CREA: 1.0000 | TOPICAL_CREAM | Freq: Every day | VAGINAL | 0 refills | Status: AC

## 2018-03-01 NOTE — Patient Instructions (Signed)
I value your feedback and entrusting us with your care. If you get a [facility] patient survey, I would appreciate you taking the time to let us know about your experience today. Thank you!  HEALTHY VAGINAL HYGIENE  AVOID   Panytyhose Synthetic underwear (wear COTTON underwear)  Tight pants/jeans Thongs Pantyliners Scented soaps/shower gels (use Dove Sensitive Skin soap or water to clean) Bubble bath/bath bombs Scented detergents  ALL dryer sheets (line dry underwear if using them on your other clothing) Feminine sprays/douches   FOR RECURRENT BACTERIAL VAGINOSIS (BV) Above recommendations and ADD probiotics daily, USE CONDOMS Use boric acid suppositories vaginally nightly (after completing antibiotic treatment)  Visit [email or web address]

## 2018-03-01 NOTE — Progress Notes (Signed)
[name], [name], FNP   Chief Complaint  Patient presents with  . Vaginal odor    discharge and odor x 2 yrs, also has had spotting with odor every 6 months x 2 yrs    HPI:      Ms. Julia Brown is a 47 y.o. No obstetric history on file. who LMP was No LMP recorded. Patient has had an ablation., presents today for NP eval of recurrent BV sx, referred by PCP. Pt with hx of increased d/c and fishy odor, no irritation, daily for the past 2 months. She has had occas sx randomly since endometrial ablation in 2007 for menorrhagia, but sx resolved or pt thought they were normal. Sx more persistent and frequent now, as well as triggered by sex. Has been treated with flagyl by PCP with temporary relief. Pt had neg pap/STD testing with PCP 9/19. Pt also with occas yeast vag due to DM. Uses dove soap, no dryer sheets.  Pt having monthly menses now, light spotting for 2 days. Bleeding had been 1-2 times yearly after ablation. Pt also has dark d/c 1-2 days after sex now.    Past Medical History:  Diagnosis Date  . Chicken pox   . Diabetes mellitus   . GERD (gastroesophageal reflux disease)   . Hyperlipidemia   . Hypertension   . UTI (lower urinary tract infection)     Past Surgical History:  Procedure Laterality Date  . ABLATION  2007   excessive bleeding  . CESAREAN SECTION  1994  . CHOLECYSTECTOMY  1993  . ORIF FEMUR FRACTURE Right    [place]  . TUBAL LIGATION    . uterine ablation      Family History  Problem Relation Age of Onset  . Heart disease Father   . Hyperlipidemia Father   . Diabetes Father   . Hyperlipidemia Mother   . Hypertension Mother   . Schizophrenia Mother   . Depression Mother   . Diabetes Mother   . Heart disease Other   . Cancer Maternal Aunt        breast  . Diabetes Maternal Grandmother   . Diabetes Maternal Grandfather     Social History   Socioeconomic History  . Marital status: Married    Spouse name: Not on file  . Number of  children: Not on file  . Years of education: Not on file  . Highest education level: Not on file  Occupational History  . Not on file  Social Needs  . Financial resource strain: Not on file  . Food insecurity:    Worry: Not on file    Inability: Not on file  . Transportation needs:    Medical: Not on file    Non-medical: Not on file  Tobacco Use  . Smoking status: Current Every Day Smoker    Packs/day: 0.50    Years: 10.00    Pack years: 5.00    Types: Cigarettes  . Smokeless tobacco: Never Used  . Tobacco comment: smoked < 1 pack a day  Substance and Sexual Activity  . Alcohol use: Yes    Alcohol/week: 0.0 standard drinks    Comment: weekend 3-4drinks  . Drug use: No  . Sexual activity: Yes    Birth control/protection: Surgical    Comment: tubal ligation  Lifestyle  . Physical activity:    Days per week: Not on file    Minutes per session: Not on file  . Stress: Not on file  Relationships  . Social connections:    Talks on phone: Not on file    Gets together: Not on file    Attends religious service: Not on file    Active member of club or organization: Not on file    Attends meetings of clubs or organizations: Not on file    Relationship status: Not on file  . Intimate partner violence:    Fear of current or ex partner: Not on file    Emotionally abused: Not on file    Physically abused: Not on file    Forced sexual activity: Not on file  Other Topics Concern  . Not on file  Social History Narrative   Lives in [place].      Work - [employer]      Diet - regular    Outpatient Medications Prior to Visit  Medication Sig Dispense Refill  . ACCU-CHEK FASTCLIX LANCETS MISC   99  . ACCU-CHEK GUIDE test strip   99  . amLODipine (NORVASC) 5 MG tablet Take 1 tablet (5 mg total) by mouth daily. 30 tablet 0  . aspirin EC 81 MG tablet Take 1 tablet (81 mg total) by mouth daily. 90 tablet 3  . BIOTIN PO Take by mouth.    . carvedilol (COREG) 3.125 MG  tablet TAKE 1 TABLET BY MOUTH TWICE DAILY WITH A MEAL 180 tablet 6  . empagliflozin (JARDIANCE) 10 MG TABS tablet Take 10 mg by mouth every morning. 90 tablet 1  . ibuprofen (ADVIL,MOTRIN) 800 MG tablet   12  . Insulin Glargine (LANTUS SOLOSTAR) 100 UNIT/ML Solostar Pen Inject 40 Units into the skin daily. 5 pen 12  . Insulin Pen Needle (UNIFINE PENTIPS) 31G X 5 MM MISC USE AS DIRECTED 200 each 6  . meloxicam (MOBIC) 7.5 MG tablet Take 1 tablet (7.5 mg total) by mouth daily. 30 tablet 0  . metFORMIN (GLUMETZA) 1000 MG (MOD) 24 hr tablet Take 1 tablet (1,000 mg total) by mouth daily with breakfast. 90 tablet 3  . omeprazole (PRILOSEC) 40 MG capsule TAKE 1 CAPSULE BY MOUTH ONCE DAILY 30 capsule 6  . quinapril (ACCUPRIL) 40 MG tablet Take 1 tablet (40 mg total) by mouth daily. 90 tablet 1  . rosuvastatin (CRESTOR) 10 MG tablet Take 1 tablet (10 mg total) by mouth daily. 90 tablet 3  . Vitamin D, Ergocalciferol, (DRISDOL) 1.25 MG (50000 UT) CAPS capsule Take 1 capsule by mouth once a week. 12 capsule 8  . fluconazole (DIFLUCAN) 150 MG tablet Take one tablet PO once. If sxs persist, may take one tablet PO 3 days later. 2 tablet 1  . VITAMIN D, ERGOCALCIFEROL, PO Take 50,000 Int'l Units by mouth.      No facility-administered medications prior to visit.       ROS:  Review of Systems  Constitutional: Negative for fatigue, fever and unexpected weight change.  Respiratory: Negative for cough, shortness of breath and wheezing.   Cardiovascular: Negative for chest pain, palpitations and leg swelling.  Gastrointestinal: Negative for blood in stool, constipation, diarrhea, nausea and vomiting.  Endocrine: Negative for cold intolerance, heat intolerance and polyuria.  Genitourinary: Positive for vaginal bleeding and vaginal discharge. Negative for dyspareunia, dysuria, flank pain, frequency, genital sores, hematuria, menstrual problem, pelvic pain, urgency and vaginal pain.  Musculoskeletal: Negative  for back pain, joint swelling and myalgias.  Skin: Negative for rash.  Neurological: Negative for dizziness, syncope, light-headedness, numbness and headaches.  Hematological: Negative for adenopathy.  Psychiatric/Behavioral:  Negative for agitation, confusion, sleep disturbance and suicidal ideas. The patient is not nervous/anxious.    BREAST: No symptoms   OBJECTIVE:   Vitals:  BP 140/90   Pulse 96   Ht 5\' 11"  (1.803 m)   Wt 246 lb (111.6 kg)   BMI 34.31 kg/m   Physical Exam  Constitutional: She is oriented to person, place, and time. Vital signs are normal. She appears well-developed.  Pulmonary/Chest: Effort normal.  Genitourinary: Uterus normal. There is no rash, tenderness or lesion on the right labia. There is no rash, tenderness or lesion on the left labia. Uterus is not enlarged and not tender. Cervix exhibits no motion tenderness. Right adnexum displays no mass and no tenderness. Left adnexum displays no mass and no tenderness. No erythema or tenderness in the vagina. Vaginal discharge found.  Musculoskeletal: Normal range of motion.  Neurological: She is alert and oriented to person, place, and time.  Psychiatric: She has a normal mood and affect. Her behavior is normal. Thought content normal.  Vitals reviewed.   Results: Results for orders placed or performed in visit on 03/01/18 (from the past 24 hour(s))  POCT Wet Prep with KOH     Status: Abnormal   Collection Time: 03/01/18 10:00 AM  Result Value Ref Range   Trichomonas, UA Negative    Clue Cells Wet Prep HPF POC pos    Epithelial Wet Prep HPF POC     Yeast Wet Prep HPF POC neg    Bacteria Wet Prep HPF POC     RBC Wet Prep HPF POC     WBC Wet Prep HPF POC     KOH Prep POC Positive (A) Negative     Assessment/Plan: Bacterial vaginosis - Pos sx/wet prep. Sx recurrent. Rx cleocin. Check one swab culture. Will call with results. Vaginal health handout given.  - Plan: Other/Misc lab test, clindamycin (CLEOCIN)  2 % vaginal cream, POCT Wet Prep with KOH  Vaginal discharge - Start probiotics, use condoms. Will add boric acid supp after tx for prevention. - Plan: Other/Misc lab test  Postcoital bleeding - See if sx improve with BV tx. If not, can try Rx doxy for cervicitis, although cx not friable.      Meds ordered this encounter  Medications  . clindamycin (CLEOCIN) 2 % vaginal cream    Sig: Place 1 Applicatorful vaginally at bedtime for 7 days.    Dispense:  40 g    Refill:  0    Order Specific Question:   Supervising Provider    Answer:   Nadara Mustard [[id number]]      Return if symptoms worsen or fail to improve.  Vegas Coffin B. Avrian Delfavero, PA-C 03/01/2018 10:03 AM

## 2018-03-07 ENCOUNTER — Telehealth: Payer: Self-pay | Admitting: Obstetrics and Gynecology

## 2018-03-07 MED ORDER — MOXIFLOXACIN HCL 400 MG PO TABS: 400.0000 mg | ORAL_TABLET | Freq: Every day | ORAL | 0 refills | Status: AC

## 2018-03-07 NOTE — Telephone Encounter (Signed)
Pt aware of BV (4 types) and AV (GBS) on One Swab culture. Already on cleocin and sx improving. Flagyl would not have worked. [facility] for AV [facility]. Pt doing probiotics and needs to add boric acid supp nightly for about 3 months as preventive.

## 2018-03-15 ENCOUNTER — Encounter: Payer: Self-pay | Admitting: Family

## 2018-03-29 ENCOUNTER — Other Ambulatory Visit: Payer: Self-pay | Admitting: Family

## 2018-04-01 ENCOUNTER — Encounter: Payer: Self-pay | Admitting: Family

## 2018-04-01 ENCOUNTER — Other Ambulatory Visit: Payer: Self-pay | Admitting: Family

## 2018-04-01 DIAGNOSIS — B379 Candidiasis, unspecified: Secondary | ICD-10-CM

## 2018-04-01 MED ORDER — FLUCONAZOLE 150 MG PO TABS: 150.0000 mg | ORAL_TABLET | Freq: Once | ORAL | 0 refills | Status: AC

## 2018-04-27 ENCOUNTER — Other Ambulatory Visit: Payer: Self-pay | Admitting: Family

## 2018-04-28 ENCOUNTER — Other Ambulatory Visit: Payer: Self-pay | Admitting: Family

## 2018-04-29 ENCOUNTER — Other Ambulatory Visit: Payer: Self-pay | Admitting: Family

## 2018-05-10 ENCOUNTER — Other Ambulatory Visit: Payer: Self-pay

## 2018-05-10 DIAGNOSIS — M545 Low back pain, unspecified: Secondary | ICD-10-CM

## 2018-05-10 DIAGNOSIS — G8929 Other chronic pain: Secondary | ICD-10-CM

## 2018-05-11 MED ORDER — MELOXICAM 7.5 MG PO TABS: ORAL_TABLET | ORAL | 1 refills | Status: DC

## 2018-05-16 ENCOUNTER — Encounter: Payer: Self-pay | Admitting: Family

## 2018-05-18 ENCOUNTER — Other Ambulatory Visit: Payer: Self-pay | Admitting: Family

## 2018-05-18 DIAGNOSIS — T753XXD Motion sickness, subsequent encounter: Secondary | ICD-10-CM

## 2018-05-18 DIAGNOSIS — B379 Candidiasis, unspecified: Secondary | ICD-10-CM

## 2018-05-18 MED ORDER — SCOPOLAMINE 1 MG/3DAYS TD PT72: 1.0000 | MEDICATED_PATCH | TRANSDERMAL | 0 refills | Status: DC

## 2018-05-18 MED ORDER — FLUCONAZOLE 150 MG PO TABS: 150.0000 mg | ORAL_TABLET | Freq: Once | ORAL | 1 refills | Status: AC

## 2018-05-21 ENCOUNTER — Other Ambulatory Visit: Payer: Self-pay | Admitting: Family

## 2018-06-03 ENCOUNTER — Other Ambulatory Visit: Payer: Self-pay

## 2018-06-03 ENCOUNTER — Encounter: Payer: Self-pay | Admitting: Family

## 2018-06-03 ENCOUNTER — Ambulatory Visit: Payer: BC Managed Care – PPO | Admitting: Family

## 2018-06-03 VITALS — BP 125/78 | HR 86 | Temp 98.0°F | Wt 250.6 lb

## 2018-06-03 DIAGNOSIS — E559 Vitamin D deficiency, unspecified: Secondary | ICD-10-CM

## 2018-06-03 DIAGNOSIS — E785 Hyperlipidemia, unspecified: Secondary | ICD-10-CM | POA: Diagnosis not present

## 2018-06-03 DIAGNOSIS — E119 Type 2 diabetes mellitus without complications: Secondary | ICD-10-CM

## 2018-06-03 DIAGNOSIS — I10 Essential (primary) hypertension: Secondary | ICD-10-CM | POA: Diagnosis not present

## 2018-06-03 LAB — COMPREHENSIVE METABOLIC PANEL
ALK PHOS: 59 U/L (ref 39–117)
ALT: 18 U/L (ref 0–35)
AST: 15 U/L (ref 0–37)
Albumin: 4.1 g/dL (ref 3.5–5.2)
BUN: 11 mg/dL (ref 6–23)
CO2: 25 mEq/L (ref 19–32)
Calcium: 9 mg/dL (ref 8.4–10.5)
Chloride: 103 mEq/L (ref 96–112)
Creatinine, Ser: 0.69 mg/dL (ref 0.40–1.20)
GFR: 110.34 mL/min (ref >60.00)
Glucose, Bld: 211 mg/dL — ABNORMAL HIGH (ref 70–99)
Potassium: 4 mEq/L (ref 3.5–5.1)
Sodium: 137 mEq/L (ref 135–145)
Total Bilirubin: 0.8 mg/dL (ref 0.2–1.2)
Total Protein: 6.4 g/dL (ref 6.0–8.3)

## 2018-06-03 LAB — LIPID PANEL
CHOLESTEROL: 164 mg/dL (ref 0–200)
HDL: 50.8 mg/dL (ref >39.00)
LDL Cholesterol: 87 mg/dL (ref 0–99)
NonHDL: 113.23
Total CHOL/HDL Ratio: 3
Triglycerides: 133 mg/dL (ref 0.0–149.0)
VLDL: 26.6 mg/dL (ref 0.0–40.0)

## 2018-06-03 LAB — HEMOGLOBIN A1C: Hgb A1c MFr Bld: 7.9 % — ABNORMAL HIGH (ref 4.6–6.5)

## 2018-06-03 LAB — VITAMIN D 25 HYDROXY (VIT D DEFICIENCY, FRACTURES): VITD: 53.01 ng/mL (ref 30.00–100.00)

## 2018-06-03 NOTE — Assessment & Plan Note (Signed)
Suspect improved.  Pending A1c.  Advised patient to go and start Jardiance unfortunately due to recurrent vaginal yeast infections.  Based on A1c, likely will start trial of Bydureon to see if patient can tolerate.

## 2018-06-03 NOTE — Patient Instructions (Addendum)
So nice to see you.  I want you to stay safe.   Since she has been on a cruise, I do want you to be extra vigilant regarding any symptoms, concern you may have a viral illness.  For now as we discussed, stop the Jardiance.  We will look at your A1c and go from there.

## 2018-06-03 NOTE — Assessment & Plan Note (Signed)
At goal, continue regimen. 

## 2018-06-03 NOTE — Progress Notes (Signed)
Subjective:    Patient ID: Julia Brown, female    DOB: 09/13/1970, 48 y.o.   MRN: [id number]  CC: Julia Brown is a 48 y.o. female who presents today for follow up.   HPI: Had been on cruise to [place], last week.  She denies any cough, cold, fever.  She feels well today.  No new concerns.  HTN-compliant medication.  No chest pain, shortness of breath.  DM- per month getting 3 yeast infections per month.  Otherwise things are blood sugars have improved. Had been on victoza and felt nauseas.   HLD- compliant with medication  No personal or family history of thyroid cancer        HISTORY:  Past Medical History:  Diagnosis Date  . Chicken pox   . Diabetes mellitus   . GERD (gastroesophageal reflux disease)   . Hyperlipidemia   . Hypertension   . UTI (lower urinary tract infection)    Past Surgical History:  Procedure Laterality Date  . ABLATION  2007   excessive bleeding  . CESAREAN SECTION  1994  . CHOLECYSTECTOMY  1993  . ORIF FEMUR FRACTURE Right    [place]  . TUBAL LIGATION    . uterine ablation     Family History  Problem Relation Age of Onset  . Heart disease Father   . Hyperlipidemia Father   . Diabetes Father   . Hyperlipidemia Mother   . Hypertension Mother   . Schizophrenia Mother   . Depression Mother   . Diabetes Mother   . Heart disease Other   . Cancer Maternal Aunt        breast  . Diabetes Maternal Grandmother   . Diabetes Maternal Grandfather     Allergies: Statins and Penicillins Current Outpatient Medications on File Prior to Visit  Medication Sig Dispense Refill  . ACCU-CHEK FASTCLIX LANCETS MISC   99  . ACCU-CHEK GUIDE test strip   99  . amLODipine (NORVASC) 5 MG tablet Take 1 tablet (5 mg total) by mouth daily. 30 tablet 0  . aspirin EC 81 MG tablet Take 1 tablet (81 mg total) by mouth daily. 90 tablet 3  . BIOTIN PO Take by mouth.    . carvedilol (COREG) 3.125 MG tablet TAKE 1 TABLET BY MOUTH TWICE DAILY WITH A MEAL  180 tablet 6  . ibuprofen (ADVIL,MOTRIN) 800 MG tablet   12  . Insulin Glargine (LANTUS SOLOSTAR) 100 UNIT/ML Solostar Pen Inject 40 Units into the skin daily. 5 pen 12  . Insulin Pen Needle (UNIFINE PENTIPS) 31G X 5 MM MISC USE AS DIRECTED 200 each 6  . meloxicam (MOBIC) 7.5 MG tablet Take 1 tablet (7.5 mg total) by mouth as needed. Take with food. 30 tablet 1  . metFORMIN (GLUMETZA) 1000 MG (MOD) 24 hr tablet Take 1 tablet (1,000 mg total) by mouth daily with breakfast. 90 tablet 3  . omeprazole (PRILOSEC) 40 MG capsule Take 1 capsule (40 mg total) by mouth daily. 90 capsule 2  . quinapril (ACCUPRIL) 40 MG tablet Take 1 tablet (40 mg total) by mouth daily. 90 tablet 1  . rosuvastatin (CRESTOR) 10 MG tablet Take 1 tablet (10 mg total) by mouth daily. 90 tablet 3  . scopolamine (TRANSDERM-SCOP) 1 MG/3DAYS Place 1 patch (1.5 mg total) onto the skin every 3 (three) days. 10 patch 0  . Vitamin D, Ergocalciferol, (DRISDOL) 1.25 MG (50000 UT) CAPS capsule Take 1 capsule by mouth once a week. 12 capsule 8  No current facility-administered medications on file prior to visit.     Social History   Tobacco Use  . Smoking status: Current Every Day Smoker    Packs/day: 0.50    Years: 10.00    Pack years: 5.00    Types: Cigarettes  . Smokeless tobacco: Never Used  . Tobacco comment: smoked < 1 pack a day  Substance Use Topics  . Alcohol use: Yes    Alcohol/week: 0.0 standard drinks    Comment: weekend 3-4drinks  . Drug use: No    Review of Systems  Constitutional: Negative for chills and fever.  Respiratory: Negative for cough.   Cardiovascular: Negative for chest pain and palpitations.  Gastrointestinal: Negative for nausea and vomiting.      Objective:    BP 125/78   Pulse 86   Temp 98 F (36.7 C) (Oral)   Wt 250 lb 9.6 oz (113.7 kg)   SpO2 98%   BMI 34.95 kg/m  BP Readings from Last 3 Encounters:  06/03/18 125/78  03/01/18 140/90  02/16/18 140/82   Wt Readings from Last 3  Encounters:  06/03/18 250 lb 9.6 oz (113.7 kg)  03/01/18 246 lb (111.6 kg)  02/16/18 256 lb 12.8 oz (116.5 kg)    Physical Exam Vitals signs reviewed.  Constitutional:      Appearance: She is well-developed.  Eyes:     Conjunctiva/sclera: Conjunctivae normal.  Cardiovascular:     Rate and Rhythm: Normal rate and regular rhythm.     Pulses: Normal pulses.     Heart sounds: Normal heart sounds.  Pulmonary:     Effort: Pulmonary effort is normal.     Breath sounds: Normal breath sounds. No wheezing, rhonchi or rales.  Skin:    General: Skin is warm and dry.  Neurological:     Mental Status: She is alert.  Psychiatric:        Speech: Speech normal.        Behavior: Behavior normal.        Thought Content: Thought content normal.        Assessment & Plan:   Problem List Items Addressed This Visit      Cardiovascular and Mediastinum   Essential hypertension - Primary (Chronic)    At goal, continue regimen      Relevant Orders   Comprehensive metabolic panel     Endocrine   Diabetes type 2, controlled (HCC) (Chronic)    Suspect improved.  Pending A1c.  Advised patient to go and start Jardiance unfortunately due to recurrent vaginal yeast infections.  Based on A1c, likely will start trial of Bydureon to see if patient can tolerate.      Relevant Orders   Hemoglobin A1c     Other   Hyperlipidemia (Chronic)    Pending A1c      Relevant Orders   Lipid panel   Vitamin D deficiency   Relevant Orders   VITAMIN D 25 Hydroxy (Vit-D Deficiency, Fractures)       I have discontinued Julia Brown's empagliflozin. I am also having her maintain her aspirin EC, carvedilol, Insulin Glargine, Insulin Pen Needle, Accu-Chek Guide, ibuprofen, Accu-Chek FastClix Lancets, BIOTIN PO, metFORMIN, quinapril, Vitamin D (Ergocalciferol), rosuvastatin, omeprazole, meloxicam, scopolamine, and amLODipine.   No orders of the defined types were placed in this encounter.   Return  precautions given.   Risks, benefits, and alternatives of the medications and treatment plan prescribed today were discussed, and patient expressed understanding.   Education regarding  symptom management and diagnosis given to patient on AVS.  Continue to follow with Julia Grana, FNP for routine health maintenance.   [name] and I agreed with plan.   Rennie Plowman, FNP

## 2018-06-03 NOTE — Assessment & Plan Note (Signed)
Pending A1c 

## 2018-06-06 ENCOUNTER — Encounter: Payer: Self-pay | Admitting: Family

## 2018-06-06 ENCOUNTER — Other Ambulatory Visit: Payer: Self-pay | Admitting: Family

## 2018-06-06 DIAGNOSIS — E119 Type 2 diabetes mellitus without complications: Secondary | ICD-10-CM

## 2018-06-06 MED ORDER — EXENATIDE ER 2 MG/0.85ML ~~LOC~~ AUIJ: 2.0000 mg | AUTO-INJECTOR | SUBCUTANEOUS | 3 refills | Status: DC

## 2018-06-08 ENCOUNTER — Telehealth: Payer: Self-pay | Admitting: Family

## 2018-06-08 ENCOUNTER — Other Ambulatory Visit: Payer: Self-pay | Admitting: Family

## 2018-06-08 DIAGNOSIS — E119 Type 2 diabetes mellitus without complications: Secondary | ICD-10-CM

## 2018-06-08 MED ORDER — DULAGLUTIDE 0.75 MG/0.5ML ~~LOC~~ SOAJ: 0.7500 mg | SUBCUTANEOUS | 3 refills | Status: DC

## 2018-06-08 MED ORDER — FLUCONAZOLE 150 MG PO TABS: 150.0000 mg | ORAL_TABLET | Freq: Once | ORAL | 1 refills | Status: AC

## 2018-06-08 NOTE — Telephone Encounter (Signed)
Sent in diflucan Please confirm thyroid cancer ( see prior notes)

## 2018-06-08 NOTE — Telephone Encounter (Signed)
Call patient We have a message from her [facility] employee pharmacy that bydureon  was not approved.  I have sent in Trulicity as this appears to be on her drug list.  Please make patient aware. Do not take bydureon    Please also confirm  patient has no family history of thyroid cancer and no personal history of thyroid cancer.

## 2018-06-19 ENCOUNTER — Other Ambulatory Visit: Payer: Self-pay | Admitting: Family

## 2018-07-20 ENCOUNTER — Encounter: Payer: Self-pay | Admitting: Family

## 2018-07-21 ENCOUNTER — Ambulatory Visit (INDEPENDENT_AMBULATORY_CARE_PROVIDER_SITE_OTHER): Payer: BC Managed Care – PPO | Admitting: Family Medicine

## 2018-07-21 ENCOUNTER — Other Ambulatory Visit: Payer: Self-pay

## 2018-07-21 DIAGNOSIS — L02212 Cutaneous abscess of back [any part, except buttock]: Secondary | ICD-10-CM | POA: Diagnosis not present

## 2018-07-21 MED ORDER — SULFAMETHOXAZOLE-TRIMETHOPRIM 800-160 MG PO TABS: 1.0000 | ORAL_TABLET | Freq: Two times a day (BID) | ORAL | 0 refills | Status: DC

## 2018-07-21 NOTE — Telephone Encounter (Signed)
Seeing patient today   LG

## 2018-07-21 NOTE — Progress Notes (Signed)
Patient ID: Julia Brown, female   DOB: 26-Nov-1970, 48 y.o.   MRN: [id number]  Virtual Visit via video Note  This visit type was conducted due to national recommendations for restrictions regarding the COVID-19 pandemic (e.g. social distancing).  This format is felt to be most appropriate for this patient at this time.  All issues noted in this document were discussed and addressed.  No physical exam was performed (except for noted visual exam findings with Video Visits).   I connected with Catha Brow on 07/21/18 at  3:20 PM EDT by a video enabled telemedicine application or telephone and verified that I am speaking with the correct person using two identifiers. Location patient: home Location provider: [facility] Persons participating in the virtual visit: patient, provider  I discussed the limitations, risks, security and privacy concerns of performing an evaluation and management service by video and the availability of in person appointments. I also discussed with the patient that there may be a patient responsible charge related to this service. The patient expressed understanding and agreed to proceed.  HPI:  Patient and I connected via video today due to suspected infection of skin on her back.  Patient states the area has been tender for a couple of weeks, and drained some last week.  States she thought the drainage was done however noticed it began to become more tender again 2 days ago and drained slightly more.  Her husband was able to take photographs of the area and send them to [place] via my chart.  Patient denies fever or chills.  She denies nausea, vomiting or diarrhea.  Denies body aches.  Denies cough, shortness of breath or wheezing.  Denies chest pain.  Patient has had skin infections previously, but this time seems to be worse than previously.   ROS: See pertinent positives and negatives per HPI.  Past Medical History:  Diagnosis Date  . Chicken pox   .  Diabetes mellitus   . GERD (gastroesophageal reflux disease)   . Hyperlipidemia   . Hypertension   . UTI (lower urinary tract infection)     Past Surgical History:  Procedure Laterality Date  . ABLATION  2007   excessive bleeding  . CESAREAN SECTION  1994  . CHOLECYSTECTOMY  1993  . ORIF FEMUR FRACTURE Right    [place]  . TUBAL LIGATION    . uterine ablation      Family History  Problem Relation Age of Onset  . Heart disease Father   . Hyperlipidemia Father   . Diabetes Father   . Hyperlipidemia Mother   . Hypertension Mother   . Schizophrenia Mother   . Depression Mother   . Diabetes Mother   . Heart disease Other   . Cancer Maternal Aunt        breast  . Diabetes Maternal Grandmother   . Diabetes Maternal Grandfather    Social History   Tobacco Use  . Smoking status: Current Every Day Smoker    Packs/day: 0.50    Years: 10.00    Pack years: 5.00    Types: Cigarettes  . Smokeless tobacco: Never Used  . Tobacco comment: smoked < 1 pack a day  Substance Use Topics  . Alcohol use: Yes    Alcohol/week: 0.0 standard drinks    Comment: weekend 3-4drinks    Current Outpatient Medications:  .  ACCU-CHEK FASTCLIX LANCETS MISC, , Disp: , Rfl: 99 .  ACCU-CHEK GUIDE test strip, , Disp: , Rfl: 99 .  amLODipine (NORVASC) 5 MG tablet, Take 1 tablet (5 mg total) by mouth daily., Disp: 30 tablet, Rfl: 0 .  aspirin EC 81 MG tablet, Take 1 tablet (81 mg total) by mouth daily., Disp: 90 tablet, Rfl: 3 .  BIOTIN PO, Take by mouth., Disp: , Rfl:  .  carvedilol (COREG) 3.125 MG tablet, TAKE 1 TABLET BY MOUTH TWICE DAILY WITH A MEAL, Disp: 180 tablet, Rfl: 6 .  Dulaglutide (TRULICITY) 0.75 MG/0.5ML SOPN, Inject 0.75 mg into the skin once a week., Disp: 2 pen, Rfl: 3 .  ibuprofen (ADVIL,MOTRIN) 800 MG tablet, , Disp: , Rfl: 12 .  Insulin Glargine (LANTUS SOLOSTAR) 100 UNIT/ML Solostar Pen, Inject 40 Units into the skin daily., Disp: 5 pen, Rfl: 12 .  Insulin Pen Needle (UNIFINE  PENTIPS) 31G X 5 MM MISC, USE AS DIRECTED, Disp: 200 each, Rfl: 6 .  meloxicam (MOBIC) 7.5 MG tablet, Take 1 tablet (7.5 mg total) by mouth as needed. Take with food., Disp: 30 tablet, Rfl: 1 .  metFORMIN (GLUMETZA) 1000 MG (MOD) 24 hr tablet, Take 1 tablet (1,000 mg total) by mouth daily with breakfast., Disp: 90 tablet, Rfl: 3 .  omeprazole (PRILOSEC) 40 MG capsule, Take 1 capsule (40 mg total) by mouth daily., Disp: 90 capsule, Rfl: 2 .  quinapril (ACCUPRIL) 40 MG tablet, Take 1 tablet (40 mg total) by mouth daily., Disp: 90 tablet, Rfl: 1 .  rosuvastatin (CRESTOR) 10 MG tablet, Take 1 tablet (10 mg total) by mouth daily., Disp: 90 tablet, Rfl: 3 .  scopolamine (TRANSDERM-SCOP) 1 MG/3DAYS, Place 1 patch (1.5 mg total) onto the skin every 3 (three) days., Disp: 10 patch, Rfl: 0 .  Vitamin D, Ergocalciferol, (DRISDOL) 1.25 MG (50000 UT) CAPS capsule, Take 1 capsule by mouth once a week., Disp: 12 capsule, Rfl: 8  EXAM:  VITALS per patient if applicable: no fevers per patient.   GENERAL: alert, oriented, appears well and in no acute distress  HEENT: atraumatic, conjunttiva clear, no obvious abnormalities on inspection of external nose and ears  NECK: normal movements of the head and neck  LUNGS: on inspection no signs of respiratory distress, breathing rate appears normal, no obvious gross SOB, gasping or wheezing  CV: no obvious cyanosis  MS: moves all visible extremities without noticeable abnormality  SKIN: Photograph of abscess on back does appear to be infected.  Skin does look somewhat red and has small pus pockets that are raised with whiteheads.  PSYCH/NEURO: pleasant and cooperative, no obvious depression or anxiety, speech and thought processing grossly intact  ASSESSMENT AND PLAN:  Discussed the following assessment and plan:  Abscess of back - Plan: sulfamethoxazole-trimethoprim (BACTRIM DS) 800-160 MG tablet  Patient will take course of Bactrim twice daily for 10 days  to treat abscess on back.  Patient states it has already started to drain a little on its own again, and has been advised to allow her to continue draining.  Advised to wash skin with mild soap and water, pat dry.  Advised to monitor for any worsening of redness or increase in pus.  Also suggested she try chlorhexidine wash, can wash skin 1-2 times per week with this to reduce bacteria on skin.    I discussed the assessment and treatment plan with the patient. The patient was provided an opportunity to ask questions and all were answered. The patient agreed with the plan and demonstrated an understanding of the instructions.   The patient was advised to call back or seek  an in-person evaluation if the symptoms worsen or if the condition fails to improve as anticipated.  Tracey HarriesLauren M Guse, FNP

## 2018-07-22 ENCOUNTER — Ambulatory Visit: Payer: BC Managed Care – PPO | Admitting: Family

## 2018-07-22 ENCOUNTER — Other Ambulatory Visit: Payer: Self-pay | Admitting: Family

## 2018-07-22 ENCOUNTER — Encounter: Payer: Self-pay | Admitting: Family Medicine

## 2018-07-22 DIAGNOSIS — M545 Low back pain, unspecified: Secondary | ICD-10-CM

## 2018-07-22 DIAGNOSIS — G8929 Other chronic pain: Secondary | ICD-10-CM

## 2018-07-25 ENCOUNTER — Encounter: Payer: Self-pay | Admitting: Family Medicine

## 2018-07-25 ENCOUNTER — Encounter: Payer: Self-pay | Admitting: Family

## 2018-07-25 DIAGNOSIS — L02212 Cutaneous abscess of back [any part, except buttock]: Secondary | ICD-10-CM

## 2018-07-26 MED ORDER — INSULIN PEN NEEDLE 31G X 5 MM MISC: 6 refills | Status: DC

## 2018-07-26 MED ORDER — CLINDAMYCIN HCL 300 MG PO CAPS: 300.0000 mg | ORAL_CAPSULE | Freq: Three times a day (TID) | ORAL | 0 refills | Status: DC

## 2018-08-09 ENCOUNTER — Other Ambulatory Visit: Payer: Self-pay | Admitting: Family

## 2018-08-09 DIAGNOSIS — I10 Essential (primary) hypertension: Secondary | ICD-10-CM

## 2018-08-10 ENCOUNTER — Other Ambulatory Visit: Payer: Self-pay | Admitting: Family

## 2018-08-10 DIAGNOSIS — I10 Essential (primary) hypertension: Secondary | ICD-10-CM

## 2018-08-18 ENCOUNTER — Other Ambulatory Visit: Payer: Self-pay | Admitting: Family

## 2018-08-26 ENCOUNTER — Other Ambulatory Visit: Payer: Self-pay | Admitting: Family

## 2018-08-26 DIAGNOSIS — E119 Type 2 diabetes mellitus without complications: Secondary | ICD-10-CM

## 2018-08-29 ENCOUNTER — Other Ambulatory Visit: Payer: Self-pay | Admitting: Family

## 2018-08-29 DIAGNOSIS — E119 Type 2 diabetes mellitus without complications: Secondary | ICD-10-CM

## 2018-08-30 ENCOUNTER — Other Ambulatory Visit: Payer: Self-pay | Admitting: Family

## 2018-08-30 DIAGNOSIS — E119 Type 2 diabetes mellitus without complications: Secondary | ICD-10-CM

## 2018-09-05 ENCOUNTER — Encounter: Payer: Self-pay | Admitting: Family

## 2018-09-05 ENCOUNTER — Other Ambulatory Visit: Payer: Self-pay

## 2018-09-05 ENCOUNTER — Ambulatory Visit (INDEPENDENT_AMBULATORY_CARE_PROVIDER_SITE_OTHER): Payer: BC Managed Care – PPO | Admitting: Family

## 2018-09-05 DIAGNOSIS — E119 Type 2 diabetes mellitus without complications: Secondary | ICD-10-CM | POA: Diagnosis not present

## 2018-09-05 DIAGNOSIS — M62838 Other muscle spasm: Secondary | ICD-10-CM

## 2018-09-05 LAB — LIPASE: Lipase: 29 U/L (ref 11.0–59.0)

## 2018-09-05 LAB — COMPREHENSIVE METABOLIC PANEL
ALT: 20 U/L (ref 0–35)
AST: 16 U/L (ref 0–37)
Albumin: 4.4 g/dL (ref 3.5–5.2)
Alkaline Phosphatase: 63 U/L (ref 39–117)
BUN: 16 mg/dL (ref 6–23)
CO2: 27 mEq/L (ref 19–32)
Calcium: 9.6 mg/dL (ref 8.4–10.5)
Chloride: 102 mEq/L (ref 96–112)
Creatinine, Ser: 0.68 mg/dL (ref 0.40–1.20)
GFR: 112.09 mL/min (ref >60.00)
Glucose, Bld: 107 mg/dL — ABNORMAL HIGH (ref 70–99)
Potassium: 3.9 mEq/L (ref 3.5–5.1)
Sodium: 139 mEq/L (ref 135–145)
Total Bilirubin: 0.8 mg/dL (ref 0.2–1.2)
Total Protein: 6.9 g/dL (ref 6.0–8.3)

## 2018-09-05 LAB — CBC WITH DIFFERENTIAL/PLATELET
Basophils Absolute: 0.1 10*3/uL (ref 0.0–0.1)
Basophils Relative: 0.7 % (ref 0.0–3.0)
Eosinophils Absolute: 0.2 10*3/uL (ref 0.0–0.7)
Eosinophils Relative: 2.2 % (ref 0.0–5.0)
HCT: 41.4 % (ref 36.0–46.0)
Hemoglobin: 14 g/dL (ref 12.0–15.0)
Lymphocytes Relative: 24.3 % (ref 12.0–46.0)
Lymphs Abs: 2.2 10*3/uL (ref 0.7–4.0)
MCHC: 33.7 g/dL (ref 30.0–36.0)
MCV: 90.1 fl (ref 78.0–100.0)
Monocytes Absolute: 0.7 10*3/uL (ref 0.1–1.0)
Monocytes Relative: 7.8 % (ref 3.0–12.0)
Neutro Abs: 5.8 10*3/uL (ref 1.4–7.7)
Neutrophils Relative %: 65 % (ref 43.0–77.0)
Platelets: 279 10*3/uL (ref 150.0–400.0)
RBC: 4.59 Mil/uL (ref 3.87–5.11)
RDW: 13.2 % (ref 11.5–15.5)
WBC: 8.9 10*3/uL (ref 4.0–10.5)

## 2018-09-05 LAB — LIPID PANEL
Cholesterol: 165 mg/dL (ref 0–200)
HDL: 48.6 mg/dL (ref >39.00)
LDL Cholesterol: 81 mg/dL (ref 0–99)
NonHDL: 116.87
Total CHOL/HDL Ratio: 3
Triglycerides: 177 mg/dL — ABNORMAL HIGH (ref 0.0–149.0)
VLDL: 35.4 mg/dL (ref 0.0–40.0)

## 2018-09-05 LAB — HEMOGLOBIN A1C: Hgb A1c MFr Bld: 7.6 % — ABNORMAL HIGH (ref 4.6–6.5)

## 2018-09-05 LAB — VITAMIN D 25 HYDROXY (VIT D DEFICIENCY, FRACTURES): VITD: 60.21 ng/mL (ref 30.00–100.00)

## 2018-09-05 LAB — AMYLASE: Amylase: 19 U/L — ABNORMAL LOW (ref 27–131)

## 2018-09-05 NOTE — Assessment & Plan Note (Signed)
Etiology unclear. Differentials including PUD ( alcohol, NSAID induced), refractory GERD, gastroparesis in setting of DM. Less likely from crestor since medication recently started.  Advised concerned with alcohol use; advised to stop completely for now. Trial stop mobic.  Start pepcid in addition to 40mg  prilosec. Pending  Labs, abdominal US, and close follow up. If no improvement, will consult GI.  Given avs when she came in for lab today

## 2018-09-05 NOTE — Progress Notes (Signed)
This visit type was conducted due to national recommendations for restrictions regarding the COVID-19 pandemic (e.g. social distancing).  This format is felt to be most appropriate for this patient at this time.  All issues noted in this document were discussed and addressed.  No physical exam was performed (except for noted visual exam findings with Video Visits). Virtual Visit via Video Note  I connected with@  on 09/05/18 at  8:00 AM EDT by a video enabled telemedicine application and verified that I am speaking with the correct person using two identifiers.  Location patient: home Location provider:work  Persons participating in the virtual visit: patient, provider  I discussed the limitations of evaluation and management by telemedicine and the availability of in person appointments. The patient expressed understanding and agreed to proceed.   HPI:  Overall feels well.   DM- fgb 80-140.   On lantus 40 units jardiance. No recurrent yeast infections as eating more yogurt. No hypoglycemic episodes.   Off truclity as felt nauseated.  Continues to complains of muscle 'spasm' at top of stomach, left sided, x one year, becoming more frequent. If stretches, twists wrong, or even coughs, feels like 'top of skin gets hard like in a ball and then relaxes 5 seconds later. Feels like a 'contraction.' May last for 10 minutes like this.  No fever, constipation, epigastric burning, N, vomiting, changes in appetite, night sweats. Pain is not associated with eating.  Feels like stomach as grown in size and shirt is tighter. No bloating. Gets 'full easily'.   Takes mobic Qd.   H/o cholecystectomy.   07/2017 US RUQ.   Started crestor recently.   Drinks 3-4 times per week; during the weekdays, 2 beers; on the weekends may have 'several' beers or 2 servings of liquor  ROS: See pertinent positives and negatives per HPI.  Past Medical History:  Diagnosis Date  . Chicken pox   . Diabetes  mellitus   . GERD (gastroesophageal reflux disease)   . Hyperlipidemia   . Hypertension   . UTI (lower urinary tract infection)     Past Surgical History:  Procedure Laterality Date  . ABLATION  2007   excessive bleeding  . CESAREAN SECTION  1994  . CHOLECYSTECTOMY  1993  . ORIF FEMUR FRACTURE Right    [place]  . TUBAL LIGATION    . uterine ablation      Family History  Problem Relation Age of Onset  . Heart disease Father   . Hyperlipidemia Father   . Diabetes Father   . Hyperlipidemia Mother   . Hypertension Mother   . Schizophrenia Mother   . Depression Mother   . Diabetes Mother   . Heart disease Other   . Cancer Maternal Aunt        breast  . Diabetes Maternal Grandmother   . Diabetes Maternal Grandfather     SOCIAL HX: smoker   Current Outpatient Medications:  .  ACCU-CHEK FASTCLIX LANCETS MISC, , Disp: , Rfl: 99 .  ACCU-CHEK GUIDE test strip, , Disp: , Rfl: 99 .  amLODipine (NORVASC) 5 MG tablet, Take 1 tablet (5 mg total) by mouth daily., Disp: 30 tablet, Rfl: 0 .  Ascorbic Acid (VITAMIN C PO), Take 1 tablet by mouth., Disp: , Rfl:  .  aspirin EC 81 MG tablet, Take 1 tablet (81 mg total) by mouth daily., Disp: 90 tablet, Rfl: 3 .  BIOTIN PO, Take by mouth., Disp: , Rfl:  .  carvedilol (COREG) 3.125  MG tablet, TAKE 1 TABLET BY MOUTH TWICE DAILY WITH A MEAL, Disp: 180 tablet, Rfl: 6 .  Insulin Glargine (LANTUS SOLOSTAR) 100 UNIT/ML Solostar Pen, Inject 40 Units into the skin daily., Disp: 5 pen, Rfl: 12 .  Insulin Pen Needle (UNIFINE PENTIPS) 31G X 5 MM MISC, USE AS DIRECTED, Disp: 200 each, Rfl: 6 .  JARDIANCE 10 MG TABS tablet, Take 1 tablet by mouth every morning., Disp: 90 tablet, Rfl: 1 .  meloxicam (MOBIC) 7.5 MG tablet, Take 1 tablet (7.5 mg total) by mouth daily as needed. Take with food., Disp: 30 tablet, Rfl: 1 .  metFORMIN (GLUMETZA) 1000 MG (MOD) 24 hr tablet, Take 1 tablet (1,000 mg total) by mouth daily with breakfast., Disp: 90 tablet, Rfl:  3 .  omeprazole (PRILOSEC) 40 MG capsule, Take 1 capsule (40 mg total) by mouth daily., Disp: 90 capsule, Rfl: 2 .  quinapril (ACCUPRIL) 40 MG tablet, Take 1 tablet (40 mg total) by mouth daily., Disp: 90 tablet, Rfl: 1 .  rosuvastatin (CRESTOR) 10 MG tablet, Take 1 tablet (10 mg total) by mouth daily., Disp: 90 tablet, Rfl: 3 .  Vitamin D, Ergocalciferol, (DRISDOL) 1.25 MG (50000 UT) CAPS capsule, Take 1 capsule by mouth once a week., Disp: 12 capsule, Rfl: 8 .  JARDIANCE 10 MG TABS tablet, Take 1 tablet by mouth every morning., Disp: 90 tablet, Rfl: 1  EXAM:  VITALS per patient if applicable:  GENERAL: alert, oriented, appears well and in no acute distress  HEENT: atraumatic, conjunttiva clear, no obvious abnormalities on inspection of external nose and ears  NECK: normal movements of the head and neck  LUNGS: on inspection no signs of respiratory distress, breathing rate appears normal, no obvious gross SOB, gasping or wheezing  CV: no obvious cyanosis  MS: moves all visible extremities without noticeable abnormality  PSYCH/NEURO: pleasant and cooperative, no obvious depression or anxiety, speech and thought processing grossly intact  ASSESSMENT AND PLAN:  Discussed the following assessment and plan:    Problem List Items Addressed This Visit      Endocrine   Diabetes type 2, controlled (HCC) (Chronic)    Suspect controlled. Pending a1c      Relevant Orders   Hemoglobin A1c     Other   Muscle spasm - Primary    Etiology unclear. Differentials including PUD ( alcohol, NSAID induced), refractory GERD, gastroparesis in setting of DM. Less likely from crestor since medication recently started.  Advised concerned with alcohol use; advised to stop completely for now. Trial stop mobic.  Start pepcid in addition to 40mg  prilosec. Pending  Labs, abdominal US, and close follow up. If no improvement, will consult GI.  Given avs when she came in for lab today      Relevant  Orders   US Abdomen Complete   Comprehensive metabolic panel   CBC with Differential/Platelet   Lipid panel   VITAMIN D 25 Hydroxy (Vit-D Deficiency, Fractures)   Lipase   Amylase        I discussed the assessment and treatment plan with the patient. The patient was provided an opportunity to ask questions and all were answered. The patient agreed with the plan and demonstrated an understanding of the instructions.   The patient was advised to call back or seek an in-person evaluation if the symptoms worsen or if the condition fails to improve as anticipated.   [name], FNP

## 2018-09-05 NOTE — Assessment & Plan Note (Signed)
Suspect controlled. Pending a1c 

## 2018-09-05 NOTE — Patient Instructions (Addendum)
Concern for gastroparesis; eat frequent small meals. ( read below)  Trial of pepcid ac daily in addition to prilosec.    stop of Mobic for now  Stop alcohol as concern for peptic ulcer.  ( read below)  Today we discussed referrals, orders. Ultrasound of abdomen.     I have placed these orders in the system for you.  Please be sure to give us a call if you have not heard from our office regarding this. We should hear from us within ONE week with information regarding your appointment. If not, please let me know immediately.     Close follow up and let me know of ANY new or worsening symptoms, concerns.    Gastroparesis  Gastroparesis is a condition in which food takes longer than normal to empty from the stomach. The condition is usually long-lasting (chronic). It may also be called delayed gastric emptying. There is no cure, but there are treatments and things that you can do at home to help relieve symptoms. Treating the underlying condition that causes gastroparesis can also help relieve symptoms. What are the causes? In many cases, the cause of this condition is not known. Possible causes include:  A hormone (endocrine) disorder, such as hypothyroidism or diabetes.  A nervous system disease, such as Parkinson's disease or multiple sclerosis.  Cancer, infection, or surgery that affects the stomach or vagus nerve. The vagus nerve runs from your chest, through your neck, to the lower part of your brain.  A connective tissue disorder, such as scleroderma.  Certain medicines. What increases the risk? You are more likely to develop this condition if you:  Have certain disorders or diseases, including: ? An endocrine disorder. ? An eating disorder. ? Amyloidosis. ? Scleroderma. ? Parkinson's disease. ? Multiple sclerosis. ? Cancer or infection of the stomach or the vagus nerve.  Have had surgery on the stomach or vagus nerve.  Take certain medicines.  Are female. What  are the signs or symptoms? Symptoms of this condition include:  Feeling full after eating very little.  Nausea.  Vomiting.  Heartburn.  Abdominal bloating.  Inconsistent blood sugar (glucose) levels on blood tests.  Lack of appetite.  Weight loss.  Acid from the stomach coming up into the esophagus (gastroesophageal reflux).  Sudden tightening (spasm) of the stomach, which can be painful. Symptoms may come and go. Some people may not notice any symptoms. How is this diagnosed? This condition is diagnosed with tests, such as:  Tests that check how long it takes food to move through the stomach and intestines. These tests include: ? Upper gastrointestinal (GI) series. For this test, you drink a liquid that shows up well on X-rays, and then X-rays will be taken of your intestines. ? Gastric emptying scintigraphy. For this test, you eat food that contains a small amount of radioactive material, and then scans are taken. ? Wireless capsule GI monitoring system. For this test, you swallow a pill (capsule) that records information about how foods and fluid move through your stomach.  Gastric manometry. For this test, a tube is passed down your throat and into your stomach to measure electrical and muscular activity.  Endoscopy. For this test, a long, thin tube is passed down your throat and into your stomach to check for problems in your stomach lining.  Ultrasound. This test uses sound waves to create images of inside the body. This can help rule out gallbladder disease or pancreatitis as a cause of your symptoms. How is  this treated? There is no cure for gastroparesis. Treatment may include:  Treating the underlying cause.  Managing your symptoms by making changes to your diet and exercise habits.  Taking medicines to control nausea and vomiting and to stimulate stomach muscles.  Getting food through a feeding tube in the hospital. This may be done in severe cases.  Having  surgery to insert a device into your body that helps improve stomach emptying and control nausea and vomiting (gastric neurostimulator). Follow these instructions at home:  Take over-the-counter and prescription medicines only as told by your health care provider.  Follow instructions from your health care provider about eating or drinking restrictions. Your health care provider may recommend that you: ? Eat smaller meals more often. ? Eat low-fat foods. ? Eat low-fiber forms of high-fiber foods. For example, eat cooked vegetables instead of raw vegetables. ? Have only liquid foods instead of solid foods. Liquid foods are easier to digest.  Drink enough fluid to keep your urine pale yellow.  Exercise as often as told by your health care provider.  Keep all follow-up visits as told by your health care provider. This is important. Contact a health care provider if you:  Notice that your symptoms do not improve with treatment.  Have new symptoms. Get help right away if you:  Have severe abdominal pain that does not improve with treatment.  Have nausea that is severe or does not go away.  Cannot drink fluids without vomiting. Summary  Gastroparesis is a chronic condition in which food takes longer than normal to empty from the stomach.  Symptoms include nausea, vomiting, heartburn, abdominal bloating, and loss of appetite.  Eating smaller portions, and low-fat, low-fiber foods may help you manage your symptoms.  Get help right away if you have severe abdominal pain. This information is not intended to replace advice given to you by your health care provider. Make sure you discuss any questions you have with your health care provider. Document Released: 03/09/2005 Document Revised: 01/12/2017 Document Reviewed: 01/12/2017  Peptic Ulcer  A peptic ulcer is a sore in the lining of the stomach (gastric ulcer) or the first part of the small intestine (duodenal ulcer). The ulcer causes  a gradual wearing away (erosion) of the deeper tissue. What are the causes? Normally, the lining of the stomach and the small intestine protects them from the acid that digests food. The protective lining can be damaged by:  An infection caused by a type of bacteria called Helicobacter pylori or H. pylori.  Regular use of NSAIDs, such as ibuprofen or aspirin.  Rare tumors in the stomach, small intestine, or pancreas (Zollinger-Ellison syndrome). What increases the risk? The following factors may make you more likely to develop this condition:  Smoking.  Having a family history of ulcer disease.  Drinking alcohol.  Having been hospitalized in an intensive care unit (ICU). What are the signs or symptoms? Symptoms of this condition include:  Persistent burning pain in the area between the chest and the belly button. The pain may be worse on an empty stomach and at night.  Heartburn.  Nausea and vomiting.  Bloating. If the ulcer results in bleeding, it can cause:  Black, tarry stools.  Vomiting of bright red blood.  Vomiting of material that looks like coffee grounds. How is this diagnosed? This condition may be diagnosed based on:  Your medical history and a physical exam.  Various tests or procedures, such as: ? Blood tests, stool tests, or breath  tests to check for the H. pylori bacteria. ? An X-ray exam (upper gastrointestinal series) of the esophagus, stomach, and small intestine. ? Upper endoscopy. The health care provider examines the esophagus, stomach, and small intestine using a small flexible tube that has a video camera at the end. ? Biopsy. A tissue sample is removed to be examined under a microscope. How is this treated? Treatment for this condition may include:  Eliminating the cause of the ulcer, such as smoking or use of NSAIDs, and limiting alcohol and caffeine intake.  Medicines to reduce the amount of acid in your digestive tract.  Antibiotic  medicines, if the ulcer is caused by an H. pylori infection.  An upper endoscopy may be used to treat a bleeding ulcer.  Surgery. This may be needed if the bleeding is severe or if the ulcer created a hole somewhere in the digestive system. Follow these instructions at home:  Do not drink alcohol if your health care provider tells you not to drink.  Do not use any products that contain nicotine or tobacco, such as cigarettes, e-cigarettes, and chewing tobacco. If you need help quitting, ask your health care provider.  Take over-the-counter and prescription medicines only as told by your health care provider. ? Do not use over-the-counter medicines in place of prescription medicines unless your health care provider approves. ? Do not take aspirin, ibuprofen, or other NSAIDs unless your health care provider told you to do so.  Take over-the-counter and prescription medicines only as told by your health care provider.  Keep all follow-up visits as told by your health care provider. This is important. Contact a health care provider if:  Your symptoms do not improve within 7 days of starting treatment.  You have ongoing indigestion or heartburn. Get help right away if:  You have sudden, sharp, or persistent pain in your abdomen.  You have bloody or dark black, tarry stools.  You vomit blood or material that looks like coffee grounds.  You become light-headed or you feel faint.  You become weak.  You become sweaty or clammy. Summary  A peptic ulcer is a sore in the lining of the stomach (gastric ulcer) or the first part of the small intestine (duodenal ulcer). The ulcer causes a gradual wearing away (erosion) of the deeper tissue.  Do not use any products that contain nicotine or tobacco, such as cigarettes, e-cigarettes, and chewing tobacco. If you need help quitting, ask your health care provider.  Take over-the-counter and prescription medicines only as told by your health care  provider. Do not use over-the-counter medicines in place of prescription medicines unless your health care provider approves.  Contact your health care provider if you have ongoing indigestion or heartburn.  Keep all follow-up visits as told by your health care provider. This is important. This information is not intended to replace advice given to you by your health care provider. Make sure you discuss any questions you have with your health care provider. Document Released: 03/06/2000 Document Revised: 09/14/2017 Document Reviewed: 09/14/2017 Elsevier Interactive Patient Education  2019 [employer].  Chartered certified accountant Patient Education  [employer].

## 2018-09-06 ENCOUNTER — Encounter: Payer: Self-pay | Admitting: Family

## 2018-09-08 ENCOUNTER — Other Ambulatory Visit: Payer: Self-pay | Admitting: Family

## 2018-09-09 ENCOUNTER — Other Ambulatory Visit: Payer: Self-pay | Admitting: Family

## 2018-09-09 DIAGNOSIS — E119 Type 2 diabetes mellitus without complications: Secondary | ICD-10-CM

## 2018-09-09 MED ORDER — METFORMIN HCL 500 MG PO TABS: ORAL_TABLET | ORAL | 3 refills | Status: DC

## 2018-09-12 ENCOUNTER — Other Ambulatory Visit: Payer: Self-pay | Admitting: Family

## 2018-09-12 ENCOUNTER — Ambulatory Visit: Payer: BC Managed Care – PPO

## 2018-09-13 ENCOUNTER — Other Ambulatory Visit: Payer: Self-pay

## 2018-09-13 ENCOUNTER — Other Ambulatory Visit: Payer: Self-pay | Admitting: Family

## 2018-09-13 ENCOUNTER — Ambulatory Visit
Admission: RE | Admit: 2018-09-13 | Discharge: 2018-09-13 | Disposition: A | Discharge status: Home / Self Care | Payer: BC Managed Care – PPO | Source: Ambulatory Visit | Attending: Family | Admitting: Family

## 2018-09-13 DIAGNOSIS — M62838 Other muscle spasm: Secondary | ICD-10-CM | POA: Insufficient documentation

## 2018-09-14 ENCOUNTER — Encounter: Payer: Self-pay | Admitting: Family

## 2018-09-14 DIAGNOSIS — K760 Fatty (change of) liver, not elsewhere classified: Secondary | ICD-10-CM | POA: Insufficient documentation

## 2018-09-15 ENCOUNTER — Encounter: Payer: Self-pay | Admitting: Family

## 2018-09-16 ENCOUNTER — Other Ambulatory Visit: Payer: Self-pay | Admitting: Family

## 2018-09-16 DIAGNOSIS — K760 Fatty (change of) liver, not elsewhere classified: Secondary | ICD-10-CM

## 2018-09-19 ENCOUNTER — Other Ambulatory Visit: Payer: Self-pay | Admitting: Family

## 2018-09-19 ENCOUNTER — Telehealth: Payer: Self-pay

## 2018-09-19 DIAGNOSIS — K760 Fatty (change of) liver, not elsewhere classified: Secondary | ICD-10-CM

## 2018-09-19 NOTE — Telephone Encounter (Signed)
I spoke with patient (see mychart message 09/19/18)  & she does not have any worsening abdominal pain or no new symptoms. She was advised to let us know if she does priot to seeing GI. Patient verbalized understanding.

## 2018-09-20 ENCOUNTER — Other Ambulatory Visit: Payer: Self-pay | Admitting: Family

## 2018-09-21 ENCOUNTER — Other Ambulatory Visit: Payer: Self-pay | Admitting: Family

## 2018-09-22 ENCOUNTER — Other Ambulatory Visit: Payer: Self-pay

## 2018-09-26 ENCOUNTER — Ambulatory Visit (INDEPENDENT_AMBULATORY_CARE_PROVIDER_SITE_OTHER): Payer: BC Managed Care – PPO | Admitting: Family

## 2018-09-26 ENCOUNTER — Other Ambulatory Visit: Payer: Self-pay

## 2018-09-26 ENCOUNTER — Encounter: Payer: Self-pay | Admitting: Family

## 2018-09-26 DIAGNOSIS — M5441 Lumbago with sciatica, right side: Secondary | ICD-10-CM

## 2018-09-26 DIAGNOSIS — G8929 Other chronic pain: Secondary | ICD-10-CM | POA: Diagnosis not present

## 2018-09-26 MED ORDER — GABAPENTIN 100 MG PO CAPS: 100.0000 mg | ORAL_CAPSULE | Freq: Three times a day (TID) | ORAL | 3 refills | Status: DC

## 2018-09-26 NOTE — Progress Notes (Signed)
This visit type was conducted due to national recommendations for restrictions regarding the COVID-19 pandemic (e.g. social distancing).  This format is felt to be most appropriate for this patient at this time.  All issues noted in this document were discussed and addressed.  No physical exam was performed (except for noted visual exam findings with Video Visits). Virtual Visit via Video Note  I connected with@  on 09/26/18 at  4:00 PM EDT by a video enabled telemedicine application and verified that I am speaking with the correct person using two identifiers.  Location patient: home Location provider:work or home office Persons participating in the virtual visit: patient, provider  I discussed the limitations of evaluation and management by telemedicine and the availability of in person appointments. The patient expressed understanding and agreed to proceed.  Interactive audio and video telecommunications were attempted between this provider and patient, however failed, due to patient having technical difficulties or patient did not have access to video capability.  We continued and completed visit with audio only.   HPI: CC: Low back pain for 3-4 years, waxes and wanes. Right buttock pain which shoots down posterior right leg to mid thigh. Some numbness on right leg.  NO falls, balance issues, pain in groin. No difficultly urinating, having bowel movements.   Has pin in right hip after fall in 1980's, which [facility]; [facility].   With increased activity, such as chasing grandchildren, low back, particularly right side will start to ache.   Mobic is not working well. Has been on for 4 years approx.  XR lumbar 11/2017- mild scoliotic curvature.  11/2016 hip xr- screw right femoral head; no fracture.   ROS: See pertinent positives and negatives per HPI.  Past Medical History:  Diagnosis Date  . Chicken pox   . Diabetes mellitus   . GERD  (gastroesophageal reflux disease)   . Hyperlipidemia   . Hypertension   . UTI (lower urinary tract infection)     Past Surgical History:  Procedure Laterality Date  . ABLATION  2007   excessive bleeding  . CESAREAN SECTION  1994  . CHOLECYSTECTOMY  1993  . ORIF FEMUR FRACTURE Right    [place]  . TUBAL LIGATION    . uterine ablation      Family History  Problem Relation Age of Onset  . Heart disease Father   . Hyperlipidemia Father   . Diabetes Father   . Hyperlipidemia Mother   . Hypertension Mother   . Schizophrenia Mother   . Depression Mother   . Diabetes Mother   . Heart disease Other   . Cancer Maternal Aunt        breast  . Diabetes Maternal Grandmother   . Diabetes Maternal Grandfather     SOCIAL HX: smoker   Current Outpatient Medications:  .  ACCU-CHEK FASTCLIX LANCETS MISC, , Disp: , Rfl: 99 .  ACCU-CHEK GUIDE test strip, , Disp: , Rfl: 99 .  amLODipine (NORVASC) 5 MG tablet, Take 1 tablet (5 mg total) by mouth daily., Disp: 30 tablet, Rfl: 0 .  Ascorbic Acid (VITAMIN C PO), Take 1 tablet by mouth., Disp: , Rfl:  .  aspirin EC 81 MG tablet, Take 1 tablet (81 mg total) by mouth daily., Disp: 90 tablet, Rfl: 3 .  carvedilol (COREG) 3.125 MG tablet, TAKE 1 TABLET BY MOUTH TWICE DAILY WITH A MEAL, Disp: 180 tablet, Rfl: 6 .  Insulin Glargine (BASAGLAR  KWIKPEN) 100 UNIT/ML SOPN, Inject 40 units under the skin every morning., Disp: 60 mL, Rfl: 2 .  Insulin Pen Needle (UNIFINE PENTIPS) 31G X 5 MM MISC, USE AS DIRECTED, Disp: 200 each, Rfl: 6 .  JARDIANCE 10 MG TABS tablet, Take 1 tablet by mouth every morning., Disp: 90 tablet, Rfl: 1 .  metFORMIN (GLUCOPHAGE) 500 MG tablet, Start 1000mg  po qpm, increase 500mg /week as tolerated to max dose of 2000mg /day., Disp: 180 tablet, Rfl: 3 .  omeprazole (PRILOSEC) 40 MG capsule, Take 1 capsule (40 mg total) by mouth daily., Disp: 90 capsule, Rfl: 2 .  quinapril (ACCUPRIL) 40 MG tablet, Take 1 tablet (40 mg total) by mouth  daily., Disp: 90 tablet, Rfl: 1 .  rosuvastatin (CRESTOR) 10 MG tablet, Take 1 tablet (10 mg total) by mouth daily., Disp: 90 tablet, Rfl: 3 .  Vitamin D, Ergocalciferol, (DRISDOL) 1.25 MG (50000 UT) CAPS capsule, Take 1 capsule by mouth once a week., Disp: 12 capsule, Rfl: 8   ASSESSMENT AND PLAN:  Discussed the following assessment and plan:  Problem List Items Addressed This Visit      Nervous and Auditory   Chronic low back pain with right-sided sciatica - Primary    Waxes and wanes. She declines imaging today ( advised MRI ) and would like to try medication therapy first. She will use mobic prn and start gabapentin. She will let me know how she is doing.       Relevant Medications   gabapentin (NEURONTIN) 100 MG capsule      I discussed the assessment and treatment plan with the patient. The patient was provided an opportunity to ask questions and all were answered. The patient agreed with the plan and demonstrated an understanding of the instructions.   The patient was advised to call back or seek an in-person evaluation if the symptoms worsen or if the condition fails to improve as anticipated.   [name], FNP   I spent 25 min non face to face w/ pt.

## 2018-09-26 NOTE — Patient Instructions (Signed)
Trial of gabapentin.  Will start this medication 100 mg 3 times a day however we have much room to increase.  Such as you might end up taking 100 mg in the morning, 100 mg midday, and 300 mg at bedtime.    Be mindful as it can be sedating. No alcohol on this medication.  Please let me know if helpful

## 2018-09-26 NOTE — Assessment & Plan Note (Signed)
Waxes and wanes. She declines imaging today ( advised MRI ) and would like to try medication therapy first. She will use mobic prn and start gabapentin. She will let me know how she is doing.

## 2018-10-05 ENCOUNTER — Encounter: Payer: Self-pay | Admitting: Family

## 2018-10-05 ENCOUNTER — Ambulatory Visit: Payer: BC Managed Care – PPO | Admitting: Family

## 2018-10-16 ENCOUNTER — Other Ambulatory Visit: Payer: Self-pay | Admitting: Family

## 2018-10-17 ENCOUNTER — Other Ambulatory Visit: Payer: Self-pay | Admitting: Family

## 2018-11-01 ENCOUNTER — Ambulatory Visit: Payer: BC Managed Care – PPO | Admitting: Gastroenterology

## 2018-11-01 ENCOUNTER — Other Ambulatory Visit: Payer: Self-pay

## 2018-11-01 VITALS — BP 130/80 | HR 105 | Temp 98.8°F | Ht 70.0 in | Wt 238.6 lb

## 2018-11-01 DIAGNOSIS — K760 Fatty (change of) liver, not elsewhere classified: Secondary | ICD-10-CM | POA: Diagnosis not present

## 2018-11-01 NOTE — Progress Notes (Signed)
Julia Brown Myleigh Amara MD, MRCP(U.K) [address]  [address]  [place], [place] [address]  Main: [phone]  Fax: [phone]   Gastroenterology Consultation  Referring Provider:     [name], [name], FNP Primary Care Physician:  [name], [name], FNP Primary Gastroenterologist:  Dr. Wyline Brown Huda Petrey  Reason for Consultation:     Hepatic steatosis        HPI:   Julia Brown is a 48 y.o. y/o female referred for consultation & management  by Dr. Jason CoopArnett, Lyn RecordsMargaret G, FNP.  She has been referred for hepatic steatosis.  Ultrasound abdomen performed on 09/13/2018 demonstrates absent gallbladder and hepatic steatosis.  On 09/05/2018 CMP shows no abnormalities with the transaminases.  CBC normal.  Elevated triglycerides on lipid panel.  She has a history of diabetes and hypertension along with hyperlipidemia.  She denies any past liver diseases in herself or the family.  Drinks some alcohol over the weekend but not on a daily basis.  Denies any illegal drug use.  She does complain of pain in the left upper part of her abdomen over her ribs.  Going on for 1 year.  No better or worse with food intake and no better or worse with a bowel movement.  She does have a bowel movement every day.  She states the only thing that makes it worse is stretching moving lifting weights.  Worse when she takes a deep breath.   Past Medical History:  Diagnosis Date  . Chicken pox   . Diabetes mellitus   . GERD (gastroesophageal reflux disease)   . Hyperlipidemia   . Hypertension   . UTI (lower urinary tract infection)     Past Surgical History:  Procedure Laterality Date  . ABLATION  2007   excessive bleeding  . CESAREAN SECTION  1994  . CHOLECYSTECTOMY  1993  . ORIF FEMUR FRACTURE Right    [place]  . TUBAL LIGATION    . uterine ablation      Prior to Admission medications   Medication Sig Start Date End Date Taking? Authorizing Provider  ACCU-CHEK FASTCLIX LANCETS MISC  01/19/17   [provider]  ACCU-CHEK GUIDE test strip  01/19/17   [provider]  amLODipine (NORVASC) 5 MG tablet Take 1 tablet (5 mg total) by mouth daily. 10/17/18   [name], [name], FNP  Ascorbic Acid (VITAMIN C PO) Take 1 tablet by mouth.    [provider]  aspirin EC 81 MG tablet Take 1 tablet (81 mg total) by mouth daily. 06/10/16   Almond LintIngal, Aileen, MD  carvedilol (COREG) 3.125 MG tablet TAKE 1 TABLET BY MOUTH TWICE DAILY WITH A MEAL 01/05/17   [name], [name], FNP  gabapentin (NEURONTIN) 100 MG capsule Take 1 capsule (100 mg total) by mouth 3 (three) times daily. 09/26/18   [name], [name], FNP  Insulin Glargine (BASAGLAR KWIKPEN) 100 UNIT/ML SOPN Inject 40 units under the skin every morning. 09/13/18   [name], [name], FNP  Insulin Pen Needle (UNIFINE PENTIPS) 31G X 5 MM MISC USE AS DIRECTED 07/26/18   [name], [name], FNP  JARDIANCE 10 MG TABS tablet Take 1 tablet by mouth every morning. 08/30/18   [name], [name], FNP  metFORMIN (GLUCOPHAGE) 500 MG tablet Start 1000mg  po qpm, increase 500mg /week as tolerated to max dose of 2000mg /day. 09/09/18   [name], [name], FNP  omeprazole (PRILOSEC) 40 MG capsule Take 1 capsule (40 mg total) by mouth daily. 03/29/18   Scott,  Charlene, MD  quinapril (ACCUPRIL) 40 MG tablet Take 1 tablet (40 mg total) by mouth daily. 08/10/18   Burnard Hawthorne, FNP  rosuvastatin (CRESTOR) 10 MG tablet Take 1 tablet (10 mg total) by mouth daily. 02/28/18   Burnard Hawthorne, FNP  Vitamin D, Ergocalciferol, (DRISDOL) 1.25 MG (50000 UT) CAPS capsule Take 1 capsule by mouth once a week. 02/23/18   Burnard Hawthorne, FNP    Family History  Problem Relation Age of Onset  . Heart disease Father   . Hyperlipidemia Father   . Diabetes Father   . Hyperlipidemia Mother   . Hypertension Mother   . Schizophrenia Mother   . Depression Mother   . Diabetes Mother   . Heart disease Other   . Cancer Maternal Aunt        breast  . Diabetes Maternal  Grandmother   . Diabetes Maternal Grandfather      Social History   Tobacco Use  . Smoking status: Current Every Day Smoker    Packs/day: 0.50    Years: 10.00    Pack years: 5.00    Types: Cigarettes  . Smokeless tobacco: Never Used  . Tobacco comment: smoked < 1 pack a day  Substance Use Topics  . Alcohol use: Yes    Alcohol/week: 0.0 standard drinks    Comment: weekend 3-4drinks  . Drug use: No    Allergies as of 11/01/2018 - Review Complete 09/26/2018  Allergen Reaction Noted  . Statins Palpitations 01/22/2012  . Bactrim [sulfamethoxazole-trimethoprim] Itching 07/26/2018  . Penicillins Rash 01/22/2012  . Trulicity [dulaglutide] Nausea Only 09/05/2018    Review of Systems:    All systems reviewed and negative except where noted in HPI.   Physical Exam:  There were no vitals taken for this visit. No LMP recorded. Patient has had an ablation. Psych:  Alert and cooperative. Normal mood and affect. General:   Alert,  Well-developed, well-nourished, pleasant and cooperative in NAD Head:  Normocephalic and atraumatic. Eyes:  Sclera clear, no icterus.   Conjunctiva pink. Ears:  Normal auditory acuity. Nose:  No deformity, discharge, or lesions. Mouth:  No deformity or lesions,oropharynx pink & moist. Neck:  Supple; no masses or thyromegaly. Lungs: Tenderness over the left lower ribs in the costochondral junction.  Respirations even and unlabored.  Clear throughout to auscultation.   No wheezes, crackles, or rhonchi. No acute distress. Heart:  Regular rate and rhythm; no murmurs, clicks, rubs, or gallops. Abdomen:  Normal bowel sounds.  No bruits.  Soft, non-tender and non-distended without masses, hepatosplenomegaly or hernias noted.  No guarding or rebound tenderness.    Neurologic:  Alert and oriented x3;  grossly normal neurologically. Skin:  Intact without significant lesions or rashes. No jaundice. Lymph Nodes:  No significant cervical adenopathy. Psych:  Alert and  cooperative. Normal mood and affect. There is no height or weight on file to calculate BMI.  Imaging Studies: No results found.  Assessment and Plan:   Julia Brown is a 48 y.o. y/o female has been referred for hepatic steatosis in the setting of diabetes mellitus, hypertension and hyperlipidemia.  Very likely part of metabolic syndrome.  Transaminases are normal.  Hep C antibody negative in 2017.  Tenderness over the left costochondral junctions in the lower ribs suggestive of costochondritis.  Suggested her to try Tylenol and if pain persists can have pain management to inject a steroid or lidocaine locally.  Plan 1.  Counseled on lifestyle modifications for metabolic syndrome which  includes losing weight adequate control hypertension hyperlipidemia.  Limit excess consumption of alcohol.  Check vaccination status for hepatitis a and B.  If not immune will require vaccination.  Follow up in 6 months  Dr Julia Brown Arnez Stoneking MD,MRCP(U.K)

## 2018-11-02 LAB — HEPATITIS B SURFACE ANTIBODY,QUALITATIVE: Hep B Surface Ab, Qual: REACTIVE

## 2018-11-02 LAB — HEPATITIS A ANTIBODY, TOTAL: hep A Total Ab: NEGATIVE

## 2018-11-02 LAB — HEPATITIS B SURFACE ANTIGEN: Hepatitis B Surface Ag: NEGATIVE

## 2018-11-10 ENCOUNTER — Telehealth: Payer: Self-pay

## 2018-11-10 NOTE — Telephone Encounter (Signed)
Spoke with pt and informed he of lab results and Dr. Georgeann Oppenheim suggestion for pt to receive the Hep A vaccine. Pt plans to contact her pcp for the vaccination.

## 2018-11-10 NOTE — Telephone Encounter (Signed)
-----   Message from Jonathon Bellows, MD sent at 11/04/2018  9:47 AM EDT ----- Needs hep a vaccine as not immune

## 2018-11-17 ENCOUNTER — Other Ambulatory Visit: Payer: Self-pay | Admitting: Family

## 2018-11-18 ENCOUNTER — Other Ambulatory Visit: Payer: Self-pay | Admitting: Family

## 2018-11-21 ENCOUNTER — Other Ambulatory Visit: Payer: Self-pay

## 2018-11-21 ENCOUNTER — Other Ambulatory Visit: Payer: Self-pay | Admitting: Family

## 2018-11-21 MED ORDER — AMLODIPINE BESYLATE 5 MG PO TABS: 5.0000 mg | ORAL_TABLET | Freq: Every day | ORAL | 5 refills | Status: DC

## 2018-12-19 ENCOUNTER — Other Ambulatory Visit: Payer: Self-pay | Admitting: Family

## 2018-12-26 ENCOUNTER — Other Ambulatory Visit: Payer: Self-pay | Admitting: Family

## 2018-12-26 DIAGNOSIS — Z1231 Encounter for screening mammogram for malignant neoplasm of breast: Secondary | ICD-10-CM

## 2019-01-10 ENCOUNTER — Telehealth: Payer: Self-pay | Admitting: *Deleted

## 2019-01-10 ENCOUNTER — Encounter: Payer: Self-pay | Admitting: Family

## 2019-01-10 NOTE — Telephone Encounter (Signed)
Copied from [address] [id number]. Topic: General - Other >> Jan 10, 2019 12:54 PM Rainey Pines A wrote: Patient requesting callback from nurse in  regards to [facility] message and picture sent.

## 2019-01-10 NOTE — Telephone Encounter (Signed)
Scheduled patient appointment for 9:20 tomorrow and advised if symptoms worsen will need evaluation at [facility]. Right great toe swollen and painful. See My chart message with picture.

## 2019-01-11 ENCOUNTER — Encounter: Payer: Self-pay | Admitting: Family Medicine

## 2019-01-11 ENCOUNTER — Ambulatory Visit (INDEPENDENT_AMBULATORY_CARE_PROVIDER_SITE_OTHER): Payer: BC Managed Care – PPO | Admitting: Family Medicine

## 2019-01-11 VITALS — Ht 70.0 in | Wt 239.0 lb

## 2019-01-11 DIAGNOSIS — L089 Local infection of the skin and subcutaneous tissue, unspecified: Secondary | ICD-10-CM | POA: Diagnosis not present

## 2019-01-11 MED ORDER — CLINDAMYCIN HCL 300 MG PO CAPS: 300.0000 mg | ORAL_CAPSULE | Freq: Three times a day (TID) | ORAL | 0 refills | Status: DC

## 2019-01-11 NOTE — Patient Instructions (Signed)
The following information is good tips for care of toenails and ingrown toenail prevention.  Complete antibiotics as prescribed, soak foot in Epsom salts at least 1-2 times per day for 20 minutes, pat dry and apply thin layer of triple antibiotic ointment around the cuticle edge.  If symptoms are not improving, let us know we will refer to podiatry at that time      Ingrown Toenail An ingrown toenail occurs when the corner or sides of a toenail grow into the surrounding skin. This causes discomfort and pain. The big toe is most commonly affected, but any of the toes can be affected. If an ingrown toenail is not treated, it can become infected. What are the causes? This condition may be caused by:  Wearing shoes that are too small or tight.  An injury, such as stubbing your toe or having your toe stepped on.  Improper cutting or care of your toenails.  Having nail or foot abnormalities that were present from birth (congenital abnormalities), such as having a nail that is too big for your toe. What increases the risk? The following factors may make you more likely to develop ingrown toenails:  Age. Nails tend to get thicker with age, so ingrown nails are more common among older people.  Cutting your toenails incorrectly, such as cutting them very short or cutting them unevenly. An ingrown toenail is more likely to get infected if you have:  Diabetes.  Blood flow (circulation) problems. What are the signs or symptoms? Symptoms of an ingrown toenail may include:  Pain, soreness, or tenderness.  Redness.  Swelling.  Hardening of the skin that surrounds the toenail. Signs that an ingrown toenail may be infected include:  Fluid or pus.  Symptoms that get worse instead of better. How is this diagnosed? An ingrown toenail may be diagnosed based on your medical history, your symptoms, and a physical exam. If you have fluid or blood coming from your toenail, a sample may be  collected to test for the specific type of bacteria that is causing the infection. How is this treated? Treatment depends on how severe your ingrown toenail is. You may be able to care for your toenail at home.  If you have an infection, you may be prescribed antibiotic medicines.  If you have fluid or pus draining from your toenail, your health care provider may drain it.  If you have trouble walking, you may be given crutches to use.  If you have a severe or infected ingrown toenail, you may need a procedure to remove part or all of the nail. Follow these instructions at home: Foot care   Do not pick at your toenail or try to remove it yourself.  Soak your foot in warm, soapy water. Do this for 20 minutes, 3 times a day, or as often as told by your health care provider. This helps to keep your toe clean and keep your skin soft.  Wear shoes that fit well and are not too tight. Your health care provider may recommend that you wear open-toed shoes while you heal.  Trim your toenails regularly and carefully. Cut your toenails straight across to prevent injury to the skin at the corners of the toenail. Do not cut your nails in a curved shape.  Keep your feet clean and dry to help prevent infection. Medicines  Take over-the-counter and prescription medicines only as told by your health care provider.  If you were prescribed an antibiotic, take it as told  by your health care provider. Do not stop taking the antibiotic even if you start to feel better. Activity  Return to your normal activities as told by your health care provider. Ask your health care provider what activities are safe for you.  Avoid activities that cause pain. General instructions  If your health care provider told you to use crutches to help you move around, use them as instructed.  Keep all follow-up visits as told by your health care provider. This is important. Contact a health care provider if:  You have  more redness, swelling, pain, or other symptoms that do not improve with treatment.  You have fluid, blood, or pus coming from your toenail. Get help right away if:  You have a red streak on your skin that starts at your foot and spreads up your leg.  You have a fever. Summary  An ingrown toenail occurs when the corner or sides of a toenail grow into the surrounding skin. This causes discomfort and pain. The big toe is most commonly affected, but any of the toes can be affected.  If an ingrown toenail is not treated, it can become infected.  Fluid or pus draining from your toenail is a sign of infection. Your health care provider may need to drain it. You may be given antibiotics to treat the infection.  Trimming your toenails regularly and properly can help you prevent an ingrown toenail. This information is not intended to replace advice given to you by your health care provider. Make sure you discuss any questions you have with your health care provider. Document Released: 03/06/2000 Document Revised: 07/01/2018 Document Reviewed: 11/25/2016 Elsevier Patient Education  2020 [employer].

## 2019-01-11 NOTE — Progress Notes (Signed)
Patient ID: Julia Brown, female   DOB: 05-02-1970, 48 y.o.   MRN: [id number]    Virtual Visit via video Note  This visit type was conducted due to national recommendations for restrictions regarding the COVID-19 pandemic (e.g. social distancing).  This format is felt to be most appropriate for this patient at this time.  All issues noted in this document were discussed and addressed.  No physical exam was performed (except for noted visual exam findings with Video Visits).   I connected with Rudene Anda today at  9:20 AM EDT by a video enabled telemedicine application and verified that I am speaking with the correct person using two identifiers. Location patient: home Location provider: work or home office Persons participating in the virtual visit: patient, provider  I discussed the limitations, risks, security and privacy concerns of performing an evaluation and management service by telephone and the availability of in person appointments. I also discussed with the patient that there may be a patient responsible charge related to this service. The patient expressed understanding and agreed to proceed.  HPI:  Patient and I connected via video to discuss suspected infection of right great toe.  Patient has had issues before with infections and toenails and has had to have a toenail removed before due to infection.  Patient states her right great toe began to throb yesterday, so she has been trying to keep weight off of it and keep it elevated.  Denies any pus draining from the cuticle.  Denies fever chills.  Denies redness spreading up the leg.  ROS: See pertinent positives and negatives per HPI.  Past Medical History:  Diagnosis Date  . Chicken pox   . Diabetes mellitus   . GERD (gastroesophageal reflux disease)   . Hyperlipidemia   . Hypertension   . UTI (lower urinary tract infection)     Past Surgical History:  Procedure Laterality Date  . ABLATION  2007   excessive  bleeding  . CESAREAN SECTION  1994  . CHOLECYSTECTOMY  1993  . ORIF FEMUR FRACTURE Right    [place]  . TUBAL LIGATION    . uterine ablation      Family History  Problem Relation Age of Onset  . Heart disease Father   . Hyperlipidemia Father   . Diabetes Father   . Hyperlipidemia Mother   . Hypertension Mother   . Schizophrenia Mother   . Depression Mother   . Diabetes Mother   . Heart disease Other   . Cancer Maternal Aunt        breast  . Diabetes Maternal Grandmother   . Diabetes Maternal Grandfather    Social History   Tobacco Use  . Smoking status: Current Every Day Smoker    Packs/day: 0.50    Years: 10.00    Pack years: 5.00    Types: Cigarettes  . Smokeless tobacco: Never Used  . Tobacco comment: smoked < 1 pack a day  Substance Use Topics  . Alcohol use: Yes    Alcohol/week: 0.0 standard drinks    Comment: weekend 3-4drinks    Current Outpatient Medications:  .  ACCU-CHEK FASTCLIX LANCETS MISC, , Disp: , Rfl: 99 .  ACCU-CHEK GUIDE test strip, , Disp: , Rfl: 99 .  amLODipine (NORVASC) 5 MG tablet, Take 1 tablet (5 mg total) by mouth daily., Disp: 30 tablet, Rfl: 5 .  Ascorbic Acid (VITAMIN C PO), Take 1 tablet by mouth., Disp: , Rfl:  .  aspirin EC  81 MG tablet, Take 1 tablet (81 mg total) by mouth daily., Disp: 90 tablet, Rfl: 3 .  carvedilol (COREG) 3.125 MG tablet, TAKE 1 TABLET BY MOUTH TWICE DAILY WITH A MEAL, Disp: 180 tablet, Rfl: 6 .  Insulin Glargine (BASAGLAR KWIKPEN) 100 UNIT/ML SOPN, Inject 40 units under the skin every morning., Disp: 60 mL, Rfl: 2 .  Insulin Pen Needle (UNIFINE PENTIPS) 31G X 5 MM MISC, USE AS DIRECTED, Disp: 200 each, Rfl: 6 .  JARDIANCE 10 MG TABS tablet, Take 1 tablet by mouth every morning., Disp: 90 tablet, Rfl: 1 .  metFORMIN (GLUCOPHAGE) 500 MG tablet, Start 1000mg  po qpm, increase 500mg /week as tolerated to max dose of 2000mg /day., Disp: 180 tablet, Rfl: 3 .  omeprazole (PRILOSEC) 40 MG capsule, Take 1 capsule (40 mg  total) by mouth daily., Disp: 90 capsule, Rfl: 2 .  quinapril (ACCUPRIL) 40 MG tablet, Take 1 tablet (40 mg total) by mouth daily., Disp: 90 tablet, Rfl: 1 .  rosuvastatin (CRESTOR) 10 MG tablet, Take 1 tablet (10 mg total) by mouth daily., Disp: 90 tablet, Rfl: 3 .  Vitamin D, Ergocalciferol, (DRISDOL) 1.25 MG (50000 UT) CAPS capsule, Take 1 capsule by mouth once a week., Disp: 12 capsule, Rfl: 8  EXAM:  GENERAL: alert, oriented, appears well and in no acute distress  HEENT: atraumatic, conjunttiva clear, no obvious abnormalities on inspection of external nose and ears  NECK: normal movements of the head and neck  LUNGS: on inspection no signs of respiratory distress, breathing rate appears normal, no obvious gross SOB, gasping or wheezing  CV: no obvious cyanosis  MS: moves all visible extremities without noticeable abnormality  PSYCH/NEURO: pleasant and cooperative, no obvious depression or anxiety, speech and thought processing grossly intact  ASSESSMENT AND PLAN:  Discussed the following assessment and plan:  Right toe infection - we will treat patient with clindamycin 3 times daily for 10 days to cover infection.  She is allergic to penicillins and Bactrim this is why clindamycin was chosen.  She will soak foot in Epsom salts at least 1-2 times a day for 20 minutes.  Advised to monitor for any worsening redness or any drainage from area and let know if any changes occur.  Advised patient she can put a thin layer of bacitracin ointment on the cuticle skin  Advised if not improved -- will plan to refer to podiatry   I discussed the assessment and treatment plan with the patient. The patient was provided an opportunity to ask questions and all were answered. The patient agreed with the plan and demonstrated an understanding of the instructions.   The patient was advised to call back or seek an in-person evaluation if the symptoms worsen or if the condition fails to improve as  anticipated.   , FNP

## 2019-01-12 ENCOUNTER — Encounter: Payer: Self-pay | Admitting: Family Medicine

## 2019-01-13 ENCOUNTER — Inpatient Hospital Stay: Payer: BC Managed Care – PPO

## 2019-01-13 ENCOUNTER — Encounter: Payer: Self-pay | Admitting: Emergency Medicine

## 2019-01-13 ENCOUNTER — Other Ambulatory Visit: Payer: Self-pay

## 2019-01-13 ENCOUNTER — Inpatient Hospital Stay
Admission: EM | Admit: 2019-01-13 | Discharge: 2019-01-14 | DRG: 638 | Disposition: A | Discharge status: Home / Self Care | Payer: BC Managed Care – PPO | Attending: Internal Medicine | Admitting: Internal Medicine

## 2019-01-13 DIAGNOSIS — E559 Vitamin D deficiency, unspecified: Secondary | ICD-10-CM | POA: Diagnosis present

## 2019-01-13 DIAGNOSIS — Z794 Long term (current) use of insulin: Secondary | ICD-10-CM | POA: Diagnosis not present

## 2019-01-13 DIAGNOSIS — Z79899 Other long term (current) drug therapy: Secondary | ICD-10-CM | POA: Diagnosis not present

## 2019-01-13 DIAGNOSIS — K219 Gastro-esophageal reflux disease without esophagitis: Secondary | ICD-10-CM | POA: Diagnosis present

## 2019-01-13 DIAGNOSIS — S91301A Unspecified open wound, right foot, initial encounter: Secondary | ICD-10-CM

## 2019-01-13 DIAGNOSIS — I160 Hypertensive urgency: Secondary | ICD-10-CM | POA: Diagnosis present

## 2019-01-13 DIAGNOSIS — L03119 Cellulitis of unspecified part of limb: Secondary | ICD-10-CM | POA: Diagnosis present

## 2019-01-13 DIAGNOSIS — Z888 Allergy status to other drugs, medicaments and biological substances status: Secondary | ICD-10-CM | POA: Diagnosis not present

## 2019-01-13 DIAGNOSIS — E11628 Type 2 diabetes mellitus with other skin complications: Secondary | ICD-10-CM | POA: Diagnosis present

## 2019-01-13 DIAGNOSIS — Z882 Allergy status to sulfonamides status: Secondary | ICD-10-CM

## 2019-01-13 DIAGNOSIS — F17210 Nicotine dependence, cigarettes, uncomplicated: Secondary | ICD-10-CM | POA: Diagnosis present

## 2019-01-13 DIAGNOSIS — L600 Ingrowing nail: Secondary | ICD-10-CM | POA: Diagnosis present

## 2019-01-13 DIAGNOSIS — E785 Hyperlipidemia, unspecified: Secondary | ICD-10-CM | POA: Diagnosis present

## 2019-01-13 DIAGNOSIS — L02611 Cutaneous abscess of right foot: Secondary | ICD-10-CM | POA: Diagnosis present

## 2019-01-13 DIAGNOSIS — Z7982 Long term (current) use of aspirin: Secondary | ICD-10-CM | POA: Diagnosis not present

## 2019-01-13 DIAGNOSIS — Z833 Family history of diabetes mellitus: Secondary | ICD-10-CM | POA: Diagnosis not present

## 2019-01-13 DIAGNOSIS — Z880 Allergy status to penicillin: Secondary | ICD-10-CM | POA: Diagnosis not present

## 2019-01-13 DIAGNOSIS — L03031 Cellulitis of right toe: Secondary | ICD-10-CM | POA: Diagnosis present

## 2019-01-13 DIAGNOSIS — Z8349 Family history of other endocrine, nutritional and metabolic diseases: Secondary | ICD-10-CM

## 2019-01-13 DIAGNOSIS — I10 Essential (primary) hypertension: Secondary | ICD-10-CM | POA: Diagnosis present

## 2019-01-13 DIAGNOSIS — L089 Local infection of the skin and subcutaneous tissue, unspecified: Secondary | ICD-10-CM

## 2019-01-13 DIAGNOSIS — L02619 Cutaneous abscess of unspecified foot: Secondary | ICD-10-CM | POA: Diagnosis present

## 2019-01-13 DIAGNOSIS — Z8249 Family history of ischemic heart disease and other diseases of the circulatory system: Secondary | ICD-10-CM | POA: Diagnosis not present

## 2019-01-13 DIAGNOSIS — IMO0002 Reserved for concepts with insufficient information to code with codable children: Secondary | ICD-10-CM | POA: Diagnosis present

## 2019-01-13 DIAGNOSIS — E1142 Type 2 diabetes mellitus with diabetic polyneuropathy: Secondary | ICD-10-CM | POA: Diagnosis present

## 2019-01-13 LAB — CBC
HCT: 43.7 % (ref 36.0–46.0)
HCT: 40.3 % (ref 36.0–46.0)
Hemoglobin: 14.3 g/dL (ref 12.0–15.0)
Hemoglobin: 13.2 g/dL (ref 12.0–15.0)
MCH: 29.5 pg (ref 26.0–34.0)
MCH: 29.3 pg (ref 26.0–34.0)
MCHC: 32.7 g/dL (ref 30.0–36.0)
MCHC: 32.8 g/dL (ref 30.0–36.0)
MCV: 90.3 fL (ref 80.0–100.0)
MCV: 89.6 fL (ref 80.0–100.0)
Platelets: 271 10*3/uL (ref 150–400)
Platelets: 263 10*3/uL (ref 150–400)
RBC: 4.84 MIL/uL (ref 3.87–5.11)
RBC: 4.5 MIL/uL (ref 3.87–5.11)
RDW: 12.3 % (ref 11.5–15.5)
RDW: 12.3 % (ref 11.5–15.5)
WBC: 13.9 10*3/uL — ABNORMAL HIGH (ref 4.0–10.5)
WBC: 12.3 10*3/uL — ABNORMAL HIGH (ref 4.0–10.5)
nRBC: 0 % (ref 0.0–0.2)
nRBC: 0 % (ref 0.0–0.2)

## 2019-01-13 LAB — COMPREHENSIVE METABOLIC PANEL
ALT: 17 U/L (ref 0–44)
AST: 16 U/L (ref 15–41)
Albumin: 4.6 g/dL (ref 3.5–5.0)
Alkaline Phosphatase: 62 U/L (ref 38–126)
Anion gap: 12 (ref 5–15)
BUN: 17 mg/dL (ref 6–20)
CO2: 24 mmol/L (ref 22–32)
Calcium: 9.8 mg/dL (ref 8.9–10.3)
Chloride: 103 mmol/L (ref 98–111)
Creatinine, Ser: 0.7 mg/dL (ref 0.44–1.00)
GFR calc Af Amer: >60 mL/min (ref >60)
GFR calc non Af Amer: >60 mL/min (ref >60)
Glucose, Bld: 132 mg/dL — ABNORMAL HIGH (ref 70–99)
Potassium: 3.9 mmol/L (ref 3.5–5.1)
Sodium: 139 mmol/L (ref 135–145)
Total Bilirubin: 0.6 mg/dL (ref 0.3–1.2)
Total Protein: 8.3 g/dL — ABNORMAL HIGH (ref 6.5–8.1)

## 2019-01-13 LAB — GLUCOSE, CAPILLARY
Glucose-Capillary: 85 mg/dL (ref 70–99)
Glucose-Capillary: 104 mg/dL — ABNORMAL HIGH (ref 70–99)
Glucose-Capillary: 206 mg/dL — ABNORMAL HIGH (ref 70–99)
Glucose-Capillary: 183 mg/dL — ABNORMAL HIGH (ref 70–99)

## 2019-01-13 LAB — BASIC METABOLIC PANEL
Anion gap: 7 (ref 5–15)
BUN: 16 mg/dL (ref 6–20)
CO2: 28 mmol/L (ref 22–32)
Calcium: 9.2 mg/dL (ref 8.9–10.3)
Chloride: 104 mmol/L (ref 98–111)
Creatinine, Ser: 0.62 mg/dL (ref 0.44–1.00)
GFR calc Af Amer: >60 mL/min (ref >60)
GFR calc non Af Amer: >60 mL/min (ref >60)
Glucose, Bld: 110 mg/dL — ABNORMAL HIGH (ref 70–99)
Potassium: 4.1 mmol/L (ref 3.5–5.1)
Sodium: 139 mmol/L (ref 135–145)

## 2019-01-13 LAB — HEMOGLOBIN A1C
Hgb A1c MFr Bld: 7.1 % — ABNORMAL HIGH (ref 4.8–5.6)
Mean Plasma Glucose: 157.07 mg/dL

## 2019-01-13 LAB — HIV ANTIBODY (ROUTINE TESTING W REFLEX): HIV Screen 4th Generation wRfx: NONREACTIVE

## 2019-01-13 MED ORDER — PIPERACILLIN-TAZOBACTAM 3.375 G IVPB 30 MIN: 3.3750 g | Freq: Four times a day (QID) | INTRAVENOUS | Status: DC

## 2019-01-13 MED ORDER — ONDANSETRON HCL 4 MG/2ML IJ SOLN
INTRAMUSCULAR | Status: AC
  Filled 2019-01-13: qty 2

## 2019-01-13 MED ORDER — ONDANSETRON HCL 4 MG/2ML IJ SOLN
4.0000 mg | Freq: Once | INTRAMUSCULAR | Status: AC
  Administered 2019-01-13: 03:00:00 4 mg via INTRAVENOUS

## 2019-01-13 MED ORDER — ONDANSETRON HCL 4 MG PO TABS: 4.0000 mg | ORAL_TABLET | Freq: Four times a day (QID) | ORAL | Status: DC | PRN

## 2019-01-13 MED ORDER — ROSUVASTATIN CALCIUM 10 MG PO TABS
10.0000 mg | ORAL_TABLET | Freq: Every day | ORAL | Status: DC
  Administered 2019-01-13: 18:00:00 10 mg via ORAL
  Filled 2019-01-13: qty 1

## 2019-01-13 MED ORDER — ENOXAPARIN SODIUM 40 MG/0.4ML ~~LOC~~ SOLN
40.0000 mg | SUBCUTANEOUS | Status: DC
  Administered 2019-01-14: 09:00:00 40 mg via SUBCUTANEOUS
  Filled 2019-01-13 (×2): qty 0.4

## 2019-01-13 MED ORDER — QUINAPRIL HCL 10 MG PO TABS
40.0000 mg | ORAL_TABLET | Freq: Every day | ORAL | Status: DC
  Administered 2019-01-13 – 2019-01-14 (×2): 40 mg via ORAL
  Filled 2019-01-13 (×3): qty 4

## 2019-01-13 MED ORDER — OXYCODONE-ACETAMINOPHEN 5-325 MG PO TABS
1.0000 | ORAL_TABLET | ORAL | Status: DC | PRN
  Administered 2019-01-13: 2 via ORAL
  Filled 2019-01-13: qty 2

## 2019-01-13 MED ORDER — VANCOMYCIN HCL 10 G IV SOLR
2000.0000 mg | Freq: Once | INTRAVENOUS | Status: AC
  Administered 2019-01-13: 04:00:00 2000 mg via INTRAVENOUS
  Filled 2019-01-13: qty 2000

## 2019-01-13 MED ORDER — CLINDAMYCIN PHOSPHATE 900 MG/50ML IV SOLN
900.0000 mg | Freq: Once | INTRAVENOUS | Status: AC
  Administered 2019-01-13: 900 mg via INTRAVENOUS
  Filled 2019-01-13: qty 50

## 2019-01-13 MED ORDER — PIPERACILLIN-TAZOBACTAM 3.375 G IVPB
3.3750 g | Freq: Three times a day (TID) | INTRAVENOUS | Status: DC
  Administered 2019-01-13 – 2019-01-14 (×4): 3.375 g via INTRAVENOUS
  Filled 2019-01-13 (×4): qty 50

## 2019-01-13 MED ORDER — LIDOCAINE HCL (PF) 1 % IJ SOLN
5.0000 mL | Freq: Once | INTRAMUSCULAR | Status: AC
  Administered 2019-01-13: 03:00:00 5 mL via INTRADERMAL
  Filled 2019-01-13: qty 5

## 2019-01-13 MED ORDER — CARVEDILOL 3.125 MG PO TABS
3.1250 mg | ORAL_TABLET | Freq: Two times a day (BID) | ORAL | Status: DC
  Administered 2019-01-13 – 2019-01-14 (×3): 3.125 mg via ORAL
  Filled 2019-01-13 (×3): qty 1

## 2019-01-13 MED ORDER — VANCOMYCIN HCL IN DEXTROSE 1-5 GM/200ML-% IV SOLN
1000.0000 mg | Freq: Two times a day (BID) | INTRAVENOUS | Status: DC
  Administered 2019-01-13 – 2019-01-14 (×2): 1000 mg via INTRAVENOUS
  Filled 2019-01-13 (×3): qty 200

## 2019-01-13 MED ORDER — ACETAMINOPHEN 325 MG PO TABS
650.0000 mg | ORAL_TABLET | Freq: Four times a day (QID) | ORAL | Status: DC | PRN
  Administered 2019-01-13 – 2019-01-14 (×2): 650 mg via ORAL
  Filled 2019-01-13 (×2): qty 2

## 2019-01-13 MED ORDER — INSULIN ASPART 100 UNIT/ML ~~LOC~~ SOLN
0.0000 [IU] | Freq: Three times a day (TID) | SUBCUTANEOUS | Status: DC
  Administered 2019-01-13: 18:00:00 7 [IU] via SUBCUTANEOUS
  Administered 2019-01-14 (×2): 3 [IU] via SUBCUTANEOUS
  Filled 2019-01-13 (×3): qty 1

## 2019-01-13 MED ORDER — AMLODIPINE BESYLATE 5 MG PO TABS
5.0000 mg | ORAL_TABLET | Freq: Every day | ORAL | Status: DC
  Administered 2019-01-13 – 2019-01-14 (×2): 5 mg via ORAL
  Filled 2019-01-13 (×2): qty 1

## 2019-01-13 MED ORDER — LIDOCAINE VISCOUS HCL 2 % MT SOLN
15.0000 mL | Freq: Once | OROMUCOSAL | Status: AC
  Administered 2019-01-13: 03:00:00 15 mL via OROMUCOSAL
  Filled 2019-01-13: qty 15

## 2019-01-13 MED ORDER — SODIUM CHLORIDE 0.9 % IV SOLN
INTRAVENOUS | Status: DC
  Administered 2019-01-13 (×2): via INTRAVENOUS

## 2019-01-13 MED ORDER — TRAZODONE HCL 50 MG PO TABS: 25.0000 mg | ORAL_TABLET | Freq: Every evening | ORAL | Status: DC | PRN

## 2019-01-13 MED ORDER — LABETALOL HCL 5 MG/ML IV SOLN: 20.0000 mg | INTRAVENOUS | Status: DC | PRN

## 2019-01-13 MED ORDER — INSULIN GLARGINE 100 UNIT/ML ~~LOC~~ SOLN
40.0000 [IU] | Freq: Every morning | SUBCUTANEOUS | Status: DC
  Administered 2019-01-13 – 2019-01-14 (×2): 40 [IU] via SUBCUTANEOUS
  Filled 2019-01-13 (×4): qty 0.4

## 2019-01-13 MED ORDER — MORPHINE SULFATE (PF) 2 MG/ML IV SOLN
2.0000 mg | Freq: Once | INTRAVENOUS | Status: AC
  Administered 2019-01-13: 2 mg via INTRAVENOUS
  Filled 2019-01-13: qty 1

## 2019-01-13 MED ORDER — MAGNESIUM HYDROXIDE 400 MG/5ML PO SUSP: 30.0000 mL | Freq: Every day | ORAL | Status: DC | PRN

## 2019-01-13 MED ORDER — ACETAMINOPHEN 650 MG RE SUPP: 650.0000 mg | Freq: Four times a day (QID) | RECTAL | Status: DC | PRN

## 2019-01-13 MED ORDER — EMPAGLIFLOZIN 10 MG PO TABS: 10.0000 mg | ORAL_TABLET | Freq: Every morning | ORAL | Status: DC

## 2019-01-13 MED ORDER — ONDANSETRON HCL 4 MG/2ML IJ SOLN: 4.0000 mg | Freq: Four times a day (QID) | INTRAMUSCULAR | Status: DC | PRN

## 2019-01-13 NOTE — ED Notes (Signed)
Dr. Owens Shark at the bedside to drain wound

## 2019-01-13 NOTE — Progress Notes (Signed)
Pharmacy Antibiotic Note  Julia Brown is a 48 y.o. female admitted on 01/13/2019 with cellulitis.  Pharmacy has been consulted for vancomycin dosing.  Plan: Patient received vanc 2g IV load and clindamycin 900 mg IV x 1. Vancomycin 1000 mg IV Q 12 hrs. Goal AUC 400-550. Expected AUC: 455.2 SCr used: 0.8 (actual 0.7) Cssmin: 12.1  Patient is also being started on zosyn 3.375g IV q8h will continue to monitor.  Height: 5\' 10"  (177.8 cm) Weight: 235 lb (106.6 kg) IBW/kg (Calculated) : 68.5  Temp (24hrs), Avg:98.6 F (37 C), Min:98.6 F (37 C), Max:98.6 F (37 C)  Recent Labs  Lab 01/13/19 0232  WBC 13.9*  CREATININE 0.70    Estimated Creatinine Clearance: 114.9 mL/min (by C-G formula based on SCr of 0.7 mg/dL).    Allergies  Allergen Reactions  . Statins Palpitations  . Bactrim [Sulfamethoxazole-Trimethoprim] Itching    itching  . Penicillins Rash  . Trulicity [Dulaglutide] Nausea Only    Thank you for allowing pharmacy to be a part of this patient's care.  Tobie Lords, PharmD, BCPS Clinical Pharmacist 01/13/2019 4:59 AM

## 2019-01-13 NOTE — ED Triage Notes (Signed)
Pt in with co right great toe pain since Monday, states started on clindamycin yesterday. States it could be due to ingrown toe nail, hx of the same.

## 2019-01-13 NOTE — ED Provider Notes (Signed)
[facility] Emergency Department Provider Note ________   First MD Initiated Contact with Patient 01/13/19 0215     (approximate)  I have reviewed the triage vital signs and the nursing notes.   HISTORY  Chief Complaint Wound Infection    HPI Julia Brown is a 48 y.o. female with below list of previous medical conditions including diabetes mellitus presents to the emergency department secondary to 4-day history of right great toe pain and swelling redness and purulent drainage which patient states she believes to be secondary to an ingrown toenail.  Patient admits to history of the same.  Patient was seen by her primary care provider via virtual visit yesterday and prescribed clindamycin and despite taking it symptoms have worsened prompting the patient's visit to the emergency department tonight.  Patient denies any fever afebrile on presentation.        Past Medical History:  Diagnosis Date  . Chicken pox   . Diabetes mellitus   . GERD (gastroesophageal reflux disease)   . Hyperlipidemia   . Hypertension   . UTI (lower urinary tract infection)     Patient Active Problem List   Diagnosis Date Noted  . Hepatic steatosis 09/14/2018  . Chronic low back pain with right-sided sciatica 12/13/2017  . Vaginal itching 12/13/2017  . Palpitations 05/20/2016  . Anxiety state 01/14/2016  . Vitamin D deficiency 05/07/2015  . Essential hypertension 01/31/2015  . Vulvovaginal candidiasis 11/01/2014  . Muscle spasm 04/26/2014  . Routine general medical examination at a health care facility 08/28/2013  . Adjustment disorder with mixed anxiety and depressed mood 08/28/2013  . Tobacco abuse 08/28/2013  . Obesity (BMI 30-39.9) 08/28/2013  . GERD (gastroesophageal reflux disease) 08/12/2012  . Hair loss 08/12/2012  . Hyperlipidemia 11/10/2010  . Diabetes type 2, controlled ([place]) 11/10/2010    Past Surgical History:  Procedure Laterality Date  .  ABLATION  2007   excessive bleeding  . CESAREAN SECTION  1994  . CHOLECYSTECTOMY  1993  . ORIF FEMUR FRACTURE Right    [place]  . TUBAL LIGATION    . uterine ablation      Prior to Admission medications   Medication Sig Start Date End Date Taking? Authorizing Provider  ACCU-CHEK FASTCLIX LANCETS [place]  01/19/17   [provider]  ACCU-CHEK GUIDE test strip  01/19/17   [provider]  amLODipine (NORVASC) 5 MG tablet Take 1 tablet (5 mg total) by mouth daily. 11/21/18   Burnard Hawthorne, FNP  Ascorbic Acid (VITAMIN C PO) Take 1 tablet by mouth.    [provider]  aspirin EC 81 MG tablet Take 1 tablet (81 mg total) by mouth daily. 06/10/16   Wende Bushy, MD  carvedilol (COREG) 3.125 MG tablet TAKE 1 TABLET BY MOUTH TWICE DAILY WITH A MEAL 01/05/17   Burnard Hawthorne, FNP  clindamycin (CLEOCIN) 300 MG capsule Take 1 capsule (300 mg total) by mouth 3 (three) times daily. 01/11/19   Jodelle Green, FNP  Insulin Glargine (BASAGLAR KWIKPEN) 100 UNIT/ML SOPN Inject 40 units under the skin every morning. 09/13/18   Burnard Hawthorne, FNP  Insulin Pen Needle (UNIFINE PENTIPS) 31G X 5 MM MISC USE AS DIRECTED 07/26/18   Burnard Hawthorne, FNP  JARDIANCE 10 MG TABS tablet Take 1 tablet by mouth every morning. 08/30/18   Burnard Hawthorne, FNP  metFORMIN (GLUCOPHAGE) 500 MG tablet Start 1000mg  po qpm, increase 500mg /week as tolerated to max dose of 2000mg /day.  09/09/18   [name], [name], FNP  omeprazole (PRILOSEC) 40 MG capsule Take 1 capsule (40 mg total) by mouth daily. 12/19/18   [name], [name], FNP  quinapril (ACCUPRIL) 40 MG tablet Take 1 tablet (40 mg total) by mouth daily. 08/10/18   [name], [name], FNP  rosuvastatin (CRESTOR) 10 MG tablet Take 1 tablet (10 mg total) by mouth daily. 02/28/18   [name], [name], FNP  Vitamin D, Ergocalciferol, (DRISDOL) 1.25 MG (50000 UT) CAPS capsule Take 1 capsule by mouth once a week. 02/23/18   [name], [name], FNP     Allergies Statins, Bactrim [sulfamethoxazole-trimethoprim], Penicillins, and Trulicity [dulaglutide]  Family History  Problem Relation Age of Onset  . Heart disease Father   . Hyperlipidemia Father   . Diabetes Father   . Hyperlipidemia Mother   . Hypertension Mother   . Schizophrenia Mother   . Depression Mother   . Diabetes Mother   . Heart disease Other   . Cancer Maternal Aunt        breast  . Diabetes Maternal Grandmother   . Diabetes Maternal Grandfather     Social History Social History   Tobacco Use  . Smoking status: Current Every Day Smoker    Packs/day: 0.50    Years: 10.00    Pack years: 5.00    Types: Cigarettes  . Smokeless tobacco: Never Used  . Tobacco comment: smoked < 1 pack a day  Substance Use Topics  . Alcohol use: Yes    Alcohol/week: 0.0 standard drinks    Comment: weekend 3-4drinks  . Drug use: No    Review of Systems Constitutional: No fever/chills Eyes: No visual changes. ENT: No sore throat. Cardiovascular: Denies chest pain. Respiratory: Denies shortness of breath. Gastrointestinal: No abdominal pain.  No nausea, no vomiting.  No diarrhea.  No constipation. Genitourinary: Negative for dysuria. Musculoskeletal: Negative for neck pain.  Negative for back pain.  Positive for right great toe pain swelling redness purulent drainage Integumentary: Negative for rash. Neurological: Negative for headaches, focal weakness or numbness.   ____________________________________________   PHYSICAL EXAM:  VITAL SIGNS: ED Triage Vitals  Enc Vitals Group     BP 01/13/19 0028 (!) 153/105     Pulse Rate 01/13/19 0028 (!) 104     Resp 01/13/19 0028 20     Temp 01/13/19 0028 98.6 F (37 C)     Temp Source 01/13/19 0028 Oral     SpO2 01/13/19 0028 100 %     Weight 01/13/19 0029 106.6 kg (235 lb)     Height 01/13/19 0029 1.778 m (5\' 10" )     Head Circumference --      Peak Flow --      Pain Score 01/13/19 0029 9     Pain Loc --      Pain  Edu? --      Excl. in GC? --     Constitutional: Alert and oriented.  Apparent discomfort Eyes: Conjunctivae are normal.  Mouth/Throat: Patient is wearing a mask. Neck: No stridor.  No meningeal signs.   Cardiovascular: Normal rate, regular rhythm. Good peripheral circulation. Grossly normal heart sounds. Respiratory: Normal respiratory effort.  No retractions. Gastrointestinal: Soft and nontender. No distention.   Musculoskeletal: No lower extremity tenderness nor edema. No gross deformities of extremities. Neurologic:  Normal speech and language. No gross focal neurologic deficits are appreciated.  Skin:     Psychiatric: Mood and affect are normal. Speech and behavior are normal.  ____________________________________________  LABS (all labs ordered are listed, but only abnormal results are displayed)  Labs Reviewed  CBC - Abnormal; Notable for the following components:      Result Value   WBC 13.9 (*)    All other components within normal limits  CULTURE, BLOOD (ROUTINE X 2)  CULTURE, BLOOD (ROUTINE X 2)  COMPREHENSIVE METABOLIC PANEL     .Marland KitchenIncision and Drainage  Date/Time: 01/13/2019 3:22 AM Performed by: Darci Current, MD Authorized by: Darci Current, MD   Consent:    Consent obtained:  Verbal   Consent given by:  Patient   Risks discussed:  Bleeding, infection, incomplete drainage and pain   Alternatives discussed:  Alternative treatment, delayed treatment and observation Location:    Type:  Abscess Pre-procedure details:    Skin preparation:  Betadine Anesthesia (see MAR for exact dosages):    Anesthesia method:  Local infiltration and topical application   Topical anesthetic:  Lidocaine gel   Local anesthetic:  Lidocaine 1% w/o epi Procedure type:    Complexity:  Complex Procedure details:    Incision types:  Single straight   Scalpel blade:  11   Drainage:  Purulent Post-procedure details:    Patient tolerance of procedure:  Tolerated well,  no immediate complications     ____________________________________________   INITIAL IMPRESSION / MDM / ASSESSMENT AND PLAN / ED COURSE  As part of my medical decision making, I reviewed the following data within the electronic MEDICAL RECORD NUMBER  48 year old female presented with above-stated history and physical exam consistent with right foot cellulitis and paronychia.  I&D performed with purulent drainage obtained.  Patient given clindamycin 900 mg IV.  Patient discussed with Dr. Arville Care hospitalist for admission for further evaluation and management. ____________________________________________  FINAL CLINICAL IMPRESSION(S) / ED DIAGNOSES  Final diagnoses:  Cellulitis and abscess of toe of right foot  Paronychia of great toe of right foot     MEDICATIONS GIVEN DURING THIS VISIT:  Medications  clindamycin (CLEOCIN) IVPB 900 mg (900 mg Intravenous New Bag/Given 01/13/19 0243)  morphine 2 MG/ML injection 2 mg (2 mg Intravenous Given 01/13/19 0229)  lidocaine (XYLOCAINE) 2 % viscous mouth solution 15 mL (15 mLs Mouth/Throat Given 01/13/19 0245)  lidocaine (PF) (XYLOCAINE) 1 % injection 5 mL (5 mLs Intradermal Given 01/13/19 0248)  ondansetron (ZOFRAN) injection 4 mg (4 mg Intravenous Given 01/13/19 0305)     ED Discharge Orders    None      *Please note:  Julia Brown was evaluated in Emergency Department on 01/13/2019 for the symptoms described in the history of present illness. She was evaluated in the context of the global COVID-19 pandemic, which necessitated consideration that the patient might be at risk for infection with the SARS-CoV-2 virus that causes COVID-19. Institutional protocols and algorithms that pertain to the evaluation of patients at risk for COVID-19 are in a state of rapid change based on information released by regulatory bodies including the CDC and federal and state organizations. These policies and algorithms were followed during the patient's care  in the ED.  Some ED evaluations and interventions may be delayed as a result of limited staffing during the pandemic.*  Note:  This document was prepared using Dragon voice recognition software and may include unintentional dictation errors.   Darci Current, MD 01/13/19 [phone]

## 2019-01-13 NOTE — Consult Note (Signed)
PODIATRY / FOOT AND ANKLE SURGERY CONSULTATION NOTE  Requesting Physician: Julia DavidJan Mansy, MD  Reason for consult: Right foot infection  Chief Complaint: Right big toe ingrown nail with drainage   HPI: Julia CrumbMonique T Brown is a 48 y.o. female who presents with a painful, red, swollen, draining right big toe in the area of her big toenail.  Patient presented to the emergency room yesterday evening due to acute onset of right big toe swelling with associated erythema, pain, drainage.  She states that this has been present since Tuesday of this week but has been worsening and is now throbbing with pain.  In the emergency room she was seen and an incision and drainage was performed releasing some purulent fluid in the area.  She was started on antibiotics and presents today resting in bed comfortably.  She states that overall she is feeling pretty good today but still has pain to the right big toe.  PMHx:  Past Medical History:  Diagnosis Date  . Chicken pox   . Diabetes mellitus   . GERD (gastroesophageal reflux disease)   . Hyperlipidemia   . Hypertension   . UTI (lower urinary tract infection)     Surgical Hx:  Past Surgical History:  Procedure Laterality Date  . ABLATION  2007   excessive bleeding  . CESAREAN SECTION  1994  . CHOLECYSTECTOMY  1993  . ORIF FEMUR FRACTURE Right    [place]  . TUBAL LIGATION    . uterine ablation      FHx:  Family History  Problem Relation Age of Onset  . Heart disease Father   . Hyperlipidemia Father   . Diabetes Father   . Hyperlipidemia Mother   . Hypertension Mother   . Schizophrenia Mother   . Depression Mother   . Diabetes Mother   . Heart disease Other   . Cancer Maternal Aunt        breast  . Diabetes Maternal Grandmother   . Diabetes Maternal Grandfather     Social History:  reports that she has been smoking cigarettes. She has a 5.00 pack-year smoking history. She has never used smokeless tobacco. She reports current alcohol use. She  reports that she does not use drugs.  Allergies:  Allergies  Allergen Reactions  . Statins Palpitations  . Bactrim [Sulfamethoxazole-Trimethoprim] Itching    itching  . Penicillins Rash  . Trulicity [Dulaglutide] Nausea Only    Review of Systems: General ROS: negative Psychological ROS: negative Respiratory ROS: no cough, shortness of breath, or wheezing Cardiovascular ROS: no chest pain or dyspnea on exertion Gastrointestinal ROS: no abdominal pain, change in bowel habits, or black or bloody stools Musculoskeletal ROS: positive for - joint pain, joint swelling and pain in toe - right Neurological ROS: positive for - numbness/tingling Dermatological ROS: positive for Right hallux ingrown toenail with abscess  Medications Prior to Admission  Medication Sig Dispense Refill  . ACCU-CHEK FASTCLIX LANCETS [place]   99  . ACCU-CHEK GUIDE test strip   99  . amLODipine (NORVASC) 5 MG tablet Take 1 tablet (5 mg total) by mouth daily. 30 tablet 5  . Ascorbic Acid (VITAMIN C PO) Take 1 tablet by mouth.    Marland Kitchen. aspirin EC 81 MG tablet Take 1 tablet (81 mg total) by mouth daily. 90 tablet 3  . carvedilol (COREG) 3.125 MG tablet TAKE 1 TABLET BY MOUTH TWICE DAILY WITH A MEAL 180 tablet 6  . clindamycin (CLEOCIN) 300 MG capsule Take 1 capsule (300  mg total) by mouth 3 (three) times daily. 30 capsule 0  . Insulin Glargine (BASAGLAR KWIKPEN) 100 UNIT/ML SOPN Inject 40 units under the skin every morning. 60 mL 2  . Insulin Pen Needle (UNIFINE PENTIPS) 31G X 5 MM MISC USE AS DIRECTED 200 each 6  . JARDIANCE 10 MG TABS tablet Take 1 tablet by mouth every morning. 90 tablet 1  . metFORMIN (GLUCOPHAGE) 500 MG tablet Start  po qpm, increase /week as tolerated to max dose of /day. 180 tablet 3  . omeprazole (PRILOSEC) 40 MG capsule Take 1 capsule (40 mg total) by mouth daily. 90 capsule 2  . quinapril (ACCUPRIL) 40 MG tablet Take 1 tablet (40 mg total) by mouth daily. 90 tablet 1  .  rosuvastatin (CRESTOR) 10 MG tablet Take 1 tablet (10 mg total) by mouth daily. 90 tablet 3  . Vitamin D, Ergocalciferol, (DRISDOL) 1.25 MG (50000 UT) CAPS capsule Take 1 capsule by mouth once a week. 12 capsule 8    Physical Exam: General: Alert and oriented.  No apparent distress.  Vascular: DP/PT pulses +1 bilateral, CFT intact to digits bilateral, erythema and edema present to the right hallux with no proximal extension.  Mild nonpitting bilateral lower extremity edema.  Neuro: Light touch sensation reduced to bilateral lower extremities.  Derm: Right hallux lateral border toenail appears to be incurvated with associated erythema and edema present with purulent drainage with erythema extending to the base of the hallux.  This area appears to be macerated.  The incision that was made in the emergency room at the lateral border appears to be open but not draining anything currently.  The purulence that is able to be expressed is coming from underneath the right hallux toenail.  MSK: Pain on palpation right hallux nail.  Results for orders placed or performed during the hospital encounter of 01/13/19 (from the past 48 hour(s))  CBC     Status: Abnormal   Collection Time: 01/13/19  2:32 AM  Result Value Ref Range   WBC 13.9 (H) 4.0 - 10.5 K/uL   RBC 4.84 3.87 - 5.11 MIL/uL   Hemoglobin 14.3 12.0 - 15.0 g/dL   HCT 16.1 09.6 - 04.5 %   MCV 90.3 80.0 - 100.0 fL   MCH 29.5 26.0 - 34.0 pg   MCHC 32.7 30.0 - 36.0 g/dL   RDW 40.9 81.1 - 91.4 %   Platelets 271 150 - 400 K/uL   nRBC 0.0 0.0 - 0.2 %    Comment: Performed at [facility], [address] [place], [place] [address]  Comprehensive metabolic panel     Status: Abnormal   Collection Time: 01/13/19  2:32 AM  Result Value Ref Range   Sodium 139 135 - 145 mmol/L   Potassium 3.9 3.5 - 5.1 mmol/L   Chloride 103 98 - 111 mmol/L   CO2 24 22 - 32 mmol/L   Glucose, Bld 132 (H) 70 - 99 mg/dL   BUN 17 6 - 20 mg/dL   Creatinine,  Ser 6.21 0.44 - 1.00 mg/dL   Calcium 9.8 8.9 - 30.8 mg/dL   Total Protein 8.3 (H) 6.5 - 8.1 g/dL   Albumin 4.6 3.5 - 5.0 g/dL   AST 16 15 - 41 U/L   ALT 17 0 - 44 U/L   Alkaline Phosphatase 62 38 - 126 U/L   Total Bilirubin 0.6 0.3 - 1.2 mg/dL   GFR calc non Af Amer >60 >60 mL/min   GFR calc  Af Amer >60 >60 mL/min   Anion gap 12 5 - 15    Comment: Performed at [facility], [address] [place], [place] [address]  Blood culture (routine x 2)     Status: None (Preliminary result)   Collection Time: 01/13/19  2:32 AM   Specimen: BLOOD  Result Value Ref Range   Specimen Description BLOOD LEFT ANTECUBITAL    Special Requests      BOTTLES DRAWN AEROBIC AND ANAEROBIC Blood Culture adequate volume   Culture      NO GROWTH < 12 HOURS Performed at [facility], [address], [place], [place] [address]    Report Status PENDING   Blood culture (routine x 2)     Status: None (Preliminary result)   Collection Time: 01/13/19  2:32 AM   Specimen: BLOOD  Result Value Ref Range   Specimen Description BLOOD BLOOD RIGHT FOREARM    Special Requests      BOTTLES DRAWN AEROBIC AND ANAEROBIC Blood Culture results may not be optimal due to an excessive volume of blood received in culture bottles   Culture      NO GROWTH < 12 HOURS Performed at [facility], [address], [place], [place] [address]    Report Status PENDING   Hemoglobin A1c     Status: Abnormal   Collection Time: 01/13/19  5:24 AM  Result Value Ref Range   Hgb A1c MFr Bld 7.1 (H) 4.8 - 5.6 %    Comment: (NOTE) Pre diabetes:          5.7%-6.4% Diabetes:              >6.4% Glycemic control for   <7.0% adults with diabetes    Mean Plasma Glucose 157.07 mg/dL    Comment: Performed at [facility], [address], [place], [place] [address]  HIV Antibody (routine testing w rflx)     Status: None   Collection Time: 01/13/19  5:24 AM  Result Value Ref Range   HIV Screen 4th Generation wRfx  NON REACTIVE NON REACTIVE    Comment: Performed at [facility], [address], [place], [place] [address]  Basic metabolic panel     Status: Abnormal   Collection Time: 01/13/19  5:24 AM  Result Value Ref Range   Sodium 139 135 - 145 mmol/L   Potassium 4.1 3.5 - 5.1 mmol/L   Chloride 104 98 - 111 mmol/L   CO2 28 22 - 32 mmol/L   Glucose, Bld 110 (H) 70 - 99 mg/dL   BUN 16 6 - 20 mg/dL   Creatinine, Ser 7.26 0.44 - 1.00 mg/dL   Calcium 9.2 8.9 - 20.3 mg/dL   GFR calc non Af Amer >60 >60 mL/min   GFR calc Af Amer >60 >60 mL/min   Anion gap 7 5 - 15    Comment: Performed at [facility], [address] [place], [place] [address]  CBC     Status: Abnormal   Collection Time: 01/13/19  5:24 AM  Result Value Ref Range   WBC 12.3 (H) 4.0 - 10.5 K/uL   RBC 4.50 3.87 - 5.11 MIL/uL   Hemoglobin 13.2 12.0 - 15.0 g/dL   HCT 16.3 84.5 - 36.4 %   MCV 89.6 80.0 - 100.0 fL   MCH 29.3 26.0 - 34.0 pg   MCHC 32.8 30.0 - 36.0 g/dL   RDW 68.0 32.1 - 22.4 %   Platelets 263  150 - 400 K/uL   nRBC 0.0 0.0 - 0.2 %    Comment: Performed at [facility], [address] [place], [place] [address]  Glucose, capillary     Status: None   Collection Time: 01/13/19  7:47 AM  Result Value Ref Range   Glucose-Capillary 85 70 - 99 mg/dL  Glucose, capillary     Status: Abnormal   Collection Time: 01/13/19 11:42 AM  Result Value Ref Range   Glucose-Capillary 104 (H) 70 - 99 mg/dL  Glucose, capillary     Status: Abnormal   Collection Time: 01/13/19  4:41 PM  Result Value Ref Range   Glucose-Capillary 206 (H) 70 - 99 mg/dL   No results found.  Blood pressure 126/81, pulse 84, temperature 98.3 F (36.8 C), temperature source Oral, resp. rate 16, height 5\' 10"  (1.778 m), weight 106.6 kg, last menstrual period 12/26/2018, SpO2 99 %.   Assessment 1. Paronychia right hallux with cellulitis 2. Abscess right hallux 3. Diabetes type 2 with polyneuropathy  Plan -Examine both  feet. -Discussed with patient all treatment options including both conservative and surgical attempts at correction.  Once the benefits and complications of surgical intervention were discussed, patient has elected for surgery consisting of complete removal of the right hallux toenail with washout.  Verbal consent obtained.  -PROCEDURE: The right hallux was cleaned with Betadine and prepped.  10 cc of 1% lidocaine plain was injected about the right hallux and a digital block.  The patient had paresthesia to the right hallux after the block was performed.  A toe tourniquet was placed around the right hallux.  The toe was then prepped further with Betadine.  A hemostat was used to free up the nail margins at the medial lateral borders of the right hallux as well as the base at its most proximal attachment.  The nail already appeared to be significantly loosened.  The total right hallux toenail was removed and passed off.  There was a mild amount of drainage present but it appeared to be mostly serous at this time.  A small amount of debridement was performed to the macerated skin margin in the area of the previous incision site of the I&D.  The surgical site was flushed with Betadine.  The toe tourniquet was removed and a prompt hyperemic response was noted to the right hallux.  A postoperative dressing was then applied consisting of Betadine followed by 4 x 4 gauze, Kling, Ace wrap.  -Leave dressing clean, dry, and intact.  Will likely change the dressing tomorrow.  If dressing becomes loosened or saturated for what ever reason change dressing consisting of applying Betadine soaked gauze to the nail site followed by 1 inch Kling and Coban. -Appreciate recommendations for antibiotic therapy.  Patient can likely switch to broad-spectrum oral antibiotics for discharge.  Continue to monitor white blood cell count.  WBC at 12 the day. -Surgical shoe ordered. -We will continue to monitor patient until discharge  with the appropriate follow-up.  Caroline More 01/13/2019, 6:26 PM

## 2019-01-13 NOTE — ED Notes (Signed)
Admitting MD at the bedside for pt evaluation.  

## 2019-01-13 NOTE — Plan of Care (Signed)
  Problem: Education: Goal: Knowledge of General Education information will improve Description: Including pain rating scale, medication(s)/side effects and non-pharmacologic comfort measures Outcome: Progressing  Pt understands her POC.  She has no other further questions.  Will continue to monitor and assess.   Problem: Clinical Measurements: Goal: Will remain free from infection Outcome: Progressing  S/Sx of infection monitored and assessed q4 hours/ PRN, along with VS.  Pt has been afebrile thus far.  She is on IV abx per MD's orders.  Will continue to monitored and assess.    Problem: Coping: Goal: Level of anxiety will decrease Outcome: Progressing  Pt has denied c/o anxiety r/t her hospitalization thus far.  Will continue to monitored and assess.   Problem: Pain Managment: Goal: General experience of comfort will improve Outcome: Progressing Pt has denied pain thus far.  Will continue to monitor and assess.   Problem: Skin Integrity: Goal: Risk for impaired skin integrity will decrease Outcome: Progressing Skin integrity monitored and assessed q-shift/ PRN.  Pt is on q2 hourly turns to prevent further skin impairment.  Tubes and drains assessed for device related pressures sores.  Will continue to monitor and assess.

## 2019-01-13 NOTE — ED Notes (Signed)
ED TO INPATIENT HANDOFF REPORT  ED Nurse Name and Phone #: Seth Bake #[phone]  S Name/Age/Gender Julia Brown 48 y.o. female Room/Bed: ED06A/ED06A  Code Status   Code Status: Full Code  Home/SNF/Other Home Patient oriented to: self, place, time and situation Is this baseline? Yes   Triage Complete: Triage complete  Chief Complaint Swollen foot  Triage Note Pt in with co right great toe pain since Monday, states started on clindamycin yesterday. States it could be due to ingrown toe nail, hx of the same.    Allergies Allergies  Allergen Reactions  . Statins Palpitations  . Bactrim [Sulfamethoxazole-Trimethoprim] Itching    itching  . Penicillins Rash  . Trulicity [Dulaglutide] Nausea Only    Level of Care/Admitting Diagnosis ED Disposition    ED Disposition Condition [place]: [place] [[id number]]  Level of Care: Med-Surg [16]  Covid Evaluation: Asymptomatic Screening Protocol (No Symptoms)  Diagnosis: Abscess or cellulitis of foot [166063]  Admitting Physician: Christel Mormon [[id number]]  Attending Physician: Christel Mormon [[id number]]  Estimated length of stay: past midnight tomorrow  Certification:: I certify this patient will need inpatient services for at least 2 midnights  PT Class (Do Not Modify): Inpatient [101]  PT Acc Code (Do Not Modify): Private [1]       B Medical/Surgery History Past Medical History:  Diagnosis Date  . Chicken pox   . Diabetes mellitus   . GERD (gastroesophageal reflux disease)   . Hyperlipidemia   . Hypertension   . UTI (lower urinary tract infection)    Past Surgical History:  Procedure Laterality Date  . ABLATION  2007   excessive bleeding  . CESAREAN SECTION  1994  . CHOLECYSTECTOMY  1993  . ORIF FEMUR FRACTURE Right    [place]  . TUBAL LIGATION    . uterine ablation       A IV Location/Drains/Wounds Patient Lines/Drains/Airways Status   Active Line/Drains/Airways    Name:   Placement date:   Placement time:   Site:   Days:   Peripheral IV 01/13/19 Left Antecubital   01/13/19    0228    Antecubital   less than 1          Intake/Output Last 24 hours No intake or output data in the 24 hours ending 01/13/19 0402  Labs/Imaging Results for orders placed or performed during the hospital encounter of 01/13/19 (from the past 48 hour(s))  CBC     Status: Abnormal   Collection Time: 01/13/19  2:32 AM  Result Value Ref Range   WBC 13.9 (H) 4.0 - 10.5 K/uL   RBC 4.84 3.87 - 5.11 MIL/uL   Hemoglobin 14.3 12.0 - 15.0 g/dL   HCT 43.7 36.0 - 46.0 %   MCV 90.3 80.0 - 100.0 fL   MCH 29.5 26.0 - 34.0 pg   MCHC 32.7 30.0 - 36.0 g/dL   RDW 12.3 11.5 - 15.5 %   Platelets 271 150 - 400 K/uL   nRBC 0.0 0.0 - 0.2 %    Comment: Performed at [facility], [address] [place], [place] [address]  Comprehensive metabolic panel     Status: Abnormal   Collection Time: 01/13/19  2:32 AM  Result Value Ref Range   Sodium 139 135 - 145 mmol/L   Potassium 3.9 3.5 - 5.1 mmol/L   Chloride 103 98 - 111 mmol/L   CO2 24 22 - 32 mmol/L   Glucose,  Bld 132 (H) 70 - 99 mg/dL   BUN 17 6 - 20 mg/dL   Creatinine, Ser 6.81 0.44 - 1.00 mg/dL   Calcium 9.8 8.9 - 15.7 mg/dL   Total Protein 8.3 (H) 6.5 - 8.1 g/dL   Albumin 4.6 3.5 - 5.0 g/dL   AST 16 15 - 41 U/L   ALT 17 0 - 44 U/L   Alkaline Phosphatase 62 38 - 126 U/L   Total Bilirubin 0.6 0.3 - 1.2 mg/dL   GFR calc non Af Amer >60 >60 mL/min   GFR calc Af Amer >60 >60 mL/min   Anion gap 12 5 - 15    Comment: Performed at [facility], [address], [place], [place] [address]   No results found.  Pending Labs Unresulted Labs (From admission, onward)    Start     Ordered   01/13/19 0500  Basic metabolic panel  Tomorrow morning,   STAT     01/13/19 0331   01/13/19 0500  CBC  Tomorrow morning,   STAT     01/13/19 0331   01/13/19 0323  HIV Antibody (routine testing w rflx)  (HIV Antibody (Routine  testing w reflex) panel)  Once,   STAT     01/13/19 0331   01/13/19 0322  Hemoglobin A1c  Once,   STAT    Comments: To assess prior glycemic control    01/13/19 0331   01/13/19 0214  Blood culture (routine x 2)  BLOOD CULTURE X 2,   STAT     01/13/19 0215          Vitals/Pain Today's Vitals   01/13/19 0028 01/13/19 0029 01/13/19 0209  BP: (!) 153/105  (!) 175/119  Pulse: (!) 104  (!) 104  Resp: 20  20  Temp: 98.6 F (37 C)    TempSrc: Oral    SpO2: 100%  100%  Weight:  106.6 kg   Height:  5\' 10"  (1.778 m)   PainSc:  9  9     Isolation Precautions No active isolations  Medications Medications  amLODipine (NORVASC) tablet 5 mg (has no administration in time range)  carvedilol (COREG) tablet 3.125 mg (has no administration in time range)  quinapril (ACCUPRIL) tablet 40 mg (has no administration in time range)  rosuvastatin (CRESTOR) tablet 10 mg (has no administration in time range)  Basaglar KwikPen KwikPen 40 Units (has no administration in time range)  empagliflozin (JARDIANCE) tablet 10 mg (has no administration in time range)  insulin aspart (novoLOG) injection 0-20 Units (has no administration in time range)  enoxaparin (LOVENOX) injection 40 mg (has no administration in time range)  0.9 %  sodium chloride infusion (has no administration in time range)  acetaminophen (TYLENOL) tablet 650 mg (has no administration in time range)    Or  acetaminophen (TYLENOL) suppository 650 mg (has no administration in time range)  traZODone (DESYREL) tablet 25 mg (has no administration in time range)  magnesium hydroxide (MILK OF MAGNESIA) suspension 30 mL (has no administration in time range)  ondansetron (ZOFRAN) tablet 4 mg (has no administration in time range)    Or  ondansetron (ZOFRAN) injection 4 mg (has no administration in time range)  piperacillin-tazobactam (ZOSYN) IVPB 3.375 g (has no administration in time range)  vancomycin (VANCOCIN) 2,000 mg in sodium chloride  0.9 % 500 mL IVPB (has no administration in time range)  morphine 2 MG/ML injection 2 mg (2 mg Intravenous Given 01/13/19 0229)  clindamycin (CLEOCIN) IVPB 900  mg (0 mg Intravenous Stopped 01/13/19 0313)  lidocaine (XYLOCAINE) 2 % viscous mouth solution 15 mL (15 mLs Mouth/Throat Given 01/13/19 0245)  lidocaine (PF) (XYLOCAINE) 1 % injection 5 mL (5 mLs Intradermal Given 01/13/19 0248)  ondansetron (ZOFRAN) injection 4 mg (4 mg Intravenous Given 01/13/19 0305)    Mobility walks Low fall risk   Focused Assessments Cardiac Assessment Handoff:    Lab Results  Component Value Date   CKTOTAL 85 11/29/2011   CKMB < 0.5 (L) 11/29/2011   TROPONINI < 0.02 11/30/2011   No results found for: DDIMER Does the Patient currently have chest pain? No     R Recommendations: See Admitting Provider Note  Report given to:   Additional Notes: n/a

## 2019-01-13 NOTE — Progress Notes (Addendum)
[place] [facility] [place] NAME: Julia Brown    MR#:  [id number]  DATE OF BIRTH:  1970-09-08  SUBJECTIVE:  CHIEF COMPLAINT:   Chief Complaint  Patient presents with  . Wound Infection  Patient seen and evaluated today Has pain in the right big toe No fever No shortness of breath and chest pain No headache dizziness blurry vision  REVIEW OF SYSTEMS:    ROS  CONSTITUTIONAL: No documented fever. No fatigue, weakness. No weight gain, no weight loss.  EYES: No blurry or double vision.  ENT: No tinnitus. No postnasal drip. No redness of the oropharynx.  RESPIRATORY: No cough, no wheeze, no hemoptysis. No dyspnea.  CARDIOVASCULAR: No chest pain. No orthopnea. No palpitations. No syncope.  GASTROINTESTINAL: No nausea, no vomiting or diarrhea. No abdominal pain. No melena or hematochezia.  GENITOURINARY: No dysuria or hematuria.  ENDOCRINE: No polyuria or nocturia. No heat or cold intolerance.  HEMATOLOGY: No anemia. No bruising. No bleeding.  INTEGUMENTARY: No rashes. No lesions.  MUSCULOSKELETAL: No arthritis. has swelling and pain in the right big toe. No gout.  NEUROLOGIC: No numbness, tingling, or ataxia. No seizure-type activity.  PSYCHIATRIC: No anxiety. No insomnia. No ADD.   DRUG ALLERGIES:   Allergies  Allergen Reactions  . Statins Palpitations  . Bactrim [Sulfamethoxazole-Trimethoprim] Itching    itching  . Penicillins Rash  . Trulicity [Dulaglutide] Nausea Only    VITALS:  Blood pressure 119/80, pulse 78, temperature 98.2 F (36.8 C), resp. rate 16, height 5\' 10"  (1.778 m), weight 106.6 kg, last menstrual period 12/26/2018, SpO2 100 %.  PHYSICAL EXAMINATION:   Physical Exam  GENERAL:  48 y.o.-year-old patient lying in the bed with no acute distress.  EYES: Pupils equal, round, reactive to light and accommodation. No scleral icterus. Extraocular muscles intact.  HEENT: Head atraumatic, normocephalic. Oropharynx and  nasopharynx clear.  NECK:  Supple, no jugular venous distention. No thyroid enlargement, no tenderness.  LUNGS: Normal breath sounds bilaterally, no wheezing, rales, rhonchi. No use of accessory muscles of respiration.  CARDIOVASCULAR: S1, S2 normal. No murmurs, rubs, or gallops.  ABDOMEN: Soft, nontender, nondistended. Bowel sounds present. No organomegaly or mass.  EXTREMITIES: No cyanosis, clubbing or edema b/l.    Right big toe bandage noted NEUROLOGIC: Cranial nerves II through XII are intact. No focal Motor or sensory deficits b/l.   PSYCHIATRIC: The patient is alert and oriented x 3.  SKIN: Swelling and purulent drainage big toe right foot     LABORATORY PANEL:   CBC Recent Labs  Lab 01/13/19 0524  WBC 12.3*  HGB 13.2  HCT 40.3  PLT 263   ------------------------------------------------------------------------------------------------------------------ Chemistries  Recent Labs  Lab 01/13/19 0232 01/13/19 0524  NA 139 139  K 3.9 4.1  CL 103 104  CO2 24 28  GLUCOSE 132* 110*  BUN 17 16  CREATININE 0.70 0.62  CALCIUM 9.8 9.2  AST 16  --   ALT 17  --   ALKPHOS 62  --   BILITOT 0.6  --    ------------------------------------------------------------------------------------------------------------------  Cardiac Enzymes No results for input(s): TROPONINI in the last 168 hours. ------------------------------------------------------------------------------------------------------------------  RADIOLOGY:  No results found.   ASSESSMENT AND PLAN:   48 year old female patient with history of type 2 diabetes mellitus, GERD, hyperlipidemia, hypertension, tobacco abuse currently under hospitalist service  -Right foot cellulitis with big toe abscess Acute paronychia with right big toe abscess Status post incision and drainage in the emergency room Continue IV vancomycin  and Zosyn antibiotic Podiatry consultation for further irrigation debridement if needed  Follow-up cultures and sensitivities  -Uncontrolled hypertension Probably secondary to pain As needed IV labetalol Continue beta-blocker, Norvasc and Accupril  -Tobacco abuse Tobacco cessation counseled to the patient for 6 minutes Nicotine patch offered  -Type 2 diabetes mellitus Diabetic diet with sliding scale coverage with insulin  -DVT prophylaxis subcu Lovenox daily  All the records are reviewed and case discussed with Care Management/Social Worker. Management plans discussed with the patient, family and they are in agreement.  CODE STATUS: Full code  DVT Prophylaxis: SCDs  TOTAL TIME TAKING CARE OF THIS PATIENT: 45 minutes.   POSSIBLE D/C IN 2 to 3 DAYS, DEPENDING ON CLINICAL CONDITION.  Ihor Austin M.D on 01/13/2019 at 11:01 AM  Between 7am to 6pm - Pager - [phone]  After 6pm go to [email or web address] - password [email or web address]  [facility]  Office  [phone]  CC: Primary care physician; Allegra Grana, FNP  Note: This dictation was prepared with Dragon dictation along with smaller phrase technology. Any transcriptional errors that result from this process are unintentional.

## 2019-01-13 NOTE — ED Notes (Signed)
Dr. Brown at the bedside for pt evaluation 

## 2019-01-13 NOTE — H&P (Addendum)
[facility] [place] at [facility]   PATIENT NAME: Julia Brown    MR#:  [id number]  DATE OF BIRTH:  10-05-1970  DATE OF ADMISSION:  01/13/2019  PRIMARY CARE PHYSICIAN: Allegra Grana, FNP   REQUESTING/REFERRING PHYSICIAN: Bayard Males, MD CHIEF COMPLAINT:   Chief Complaint  Patient presents with  . Wound Infection    HISTORY OF PRESENT ILLNESS:  Julia Brown  is a 48 y.o. African-American female with a known history of type 2 diabetes mellitus, GERD, dyslipidemia and hypertension, presented to the emergency room with acute onset of right big toe swelling with associated erythema, tenderness and warmth since Tuesday with significant worsening and throbbing starting today.  She denies any fever or chills.  No chest pain or dyspnea.  No cough or wheezing.  No sick exposures to COVID-19.  The patient apparently has been on p.o. clindamycin on outpatient basis without significant improvement.  Upon presentation to the emergency room, blood pressure was 153/105 and pulse 104 with otherwise normal vital signs.  Blood pressure later on was higher at 175/119.  Labs revealed leukocytosis otherwise were unremarkable..  The patient was given 900 mg of IV clindamycin.  She was given local lidocaine and had incision of paronychial abscess. The patient will be admitted to a medical bed for further evaluation and management.  PAST MEDICAL HISTORY:   Past Medical History:  Diagnosis Date  . Chicken pox   . Diabetes mellitus   . GERD (gastroesophageal reflux disease)   . Hyperlipidemia   . Hypertension   . UTI (lower urinary tract infection)     PAST SURGICAL HISTORY:   Past Surgical History:  Procedure Laterality Date  . ABLATION  2007   excessive bleeding  . CESAREAN SECTION  1994  . CHOLECYSTECTOMY  1993  . ORIF FEMUR FRACTURE Right    [place]  . TUBAL LIGATION    . uterine ablation      SOCIAL HISTORY:   Social History   Tobacco Use  .  Smoking status: Current Every Day Smoker    Packs/day: 0.50    Years: 10.00    Pack years: 5.00    Types: Cigarettes  . Smokeless tobacco: Never Used  . Tobacco comment: smoked < 1 pack a day  Substance Use Topics  . Alcohol use: Yes    Alcohol/week: 0.0 standard drinks    Comment: weekend 3-4drinks    FAMILY HISTORY:   Family History  Problem Relation Age of Onset  . Heart disease Father   . Hyperlipidemia Father   . Diabetes Father   . Hyperlipidemia Mother   . Hypertension Mother   . Schizophrenia Mother   . Depression Mother   . Diabetes Mother   . Heart disease Other   . Cancer Maternal Aunt        breast  . Diabetes Maternal Grandmother   . Diabetes Maternal Grandfather     DRUG ALLERGIES:   Allergies  Allergen Reactions  . Statins Palpitations  . Bactrim [Sulfamethoxazole-Trimethoprim] Itching    itching  . Penicillins Rash  . Trulicity [Dulaglutide] Nausea Only    REVIEW OF SYSTEMS:   ROS As per history of present illness. All pertinent systems were reviewed above. Constitutional,  HEENT, cardiovascular, respiratory, GI, GU, musculoskeletal, neuro, psychiatric, endocrine,  integumentary and hematologic systems were reviewed and are otherwise  negative/unremarkable except for positive findings mentioned above in the HPI.   MEDICATIONS AT HOME:   Prior to Admission medications  Medication Sig Start Date End Date Taking? Authorizing Provider  ACCU-CHEK FASTCLIX LANCETS [place]  01/19/17  Yes [provider]  ACCU-CHEK GUIDE test strip  01/19/17  Yes [provider]  amLODipine (NORVASC) 5 MG tablet Take 1 tablet (5 mg total) by mouth daily. 11/21/18  Yes ArnettYvetta Coder, FNP  Ascorbic Acid (VITAMIN C PO) Take 1 tablet by mouth.   Yes [provider]  aspirin EC 81 MG tablet Take 1 tablet (81 mg total) by mouth daily. 06/10/16  Yes Wende Bushy, MD  carvedilol (COREG) 3.125 MG tablet TAKE 1 TABLET BY MOUTH TWICE DAILY WITH A  MEAL 01/05/17  Yes Arnett, Yvetta Coder, FNP  clindamycin (CLEOCIN) 300 MG capsule Take 1 capsule (300 mg total) by mouth 3 (three) times daily. 01/11/19  Yes Guse, Jacquelynn Cree, FNP  Insulin Glargine (BASAGLAR KWIKPEN) 100 UNIT/ML SOPN Inject 40 units under the skin every morning. 09/13/18  Yes Arnett, Yvetta Coder, FNP  Insulin Pen Needle (UNIFINE PENTIPS) 31G X 5 MM MISC USE AS DIRECTED 07/26/18  Yes Arnett, Yvetta Coder, FNP  JARDIANCE 10 MG TABS tablet Take 1 tablet by mouth every morning. 08/30/18  Yes Burnard Hawthorne, FNP  metFORMIN (GLUCOPHAGE) 500 MG tablet Start 1000mg  po qpm, increase 500mg /week as tolerated to max dose of 2000mg /day. 09/09/18  Yes Burnard Hawthorne, FNP  omeprazole (PRILOSEC) 40 MG capsule Take 1 capsule (40 mg total) by mouth daily. 12/19/18  Yes Arnett, Yvetta Coder, FNP  quinapril (ACCUPRIL) 40 MG tablet Take 1 tablet (40 mg total) by mouth daily. 08/10/18  Yes Burnard Hawthorne, FNP  rosuvastatin (CRESTOR) 10 MG tablet Take 1 tablet (10 mg total) by mouth daily. 02/28/18  Yes Arnett, Yvetta Coder, FNP  Vitamin D, Ergocalciferol, (DRISDOL) 1.25 MG (50000 UT) CAPS capsule Take 1 capsule by mouth once a week. 02/23/18  Yes Burnard Hawthorne, FNP      VITAL SIGNS:  Blood pressure 125/79, pulse 81, temperature 98.6 F (37 C), temperature source Oral, resp. rate 18, height 5\' 10"  (1.778 m), weight 106.6 kg, SpO2 100 %.  PHYSICAL EXAMINATION:  Physical Exam  GENERAL:  48 y.o.-year-old African-American female patient lying in the bed with no acute distress.  EYES: Pupils equal, round, reactive to light and accommodation. No scleral icterus. Extraocular muscles intact.  HEENT: Head atraumatic, normocephalic. Oropharynx and nasopharynx clear.  NECK:  Supple, no jugular venous distention. No thyroid enlargement, no tenderness.  LUNGS: Normal breath sounds bilaterally, no wheezing, rales,rhonchi or crepitation. No use of accessory muscles of respiration.  CARDIOVASCULAR: Regular rate and  rhythm, S1, S2 normal. No murmurs, rubs, or gallops.  ABDOMEN: Soft, nondistended, nontender. Bowel sounds present. No organomegaly or mass.  EXTREMITIES: No pedal edema, cyanosis, or clubbing.  NEUROLOGIC: Cranial nerves II through XII are intact. Muscle strength 5/5 in all extremities. Sensation intact. Gait not checked.  PSYCHIATRIC: The patient is alert and oriented x 3.  Normal affect and good eye contact. SKIN: Right big toe diffuse swelling with paronychia and paronychial abscess status post drainage with erythema extending to the dorsum of the foot with associated tenderness.     LABORATORY PANEL:   CBC Recent Labs  Lab 01/13/19 0232  WBC 13.9*  HGB 14.3  HCT 43.7  PLT 271   ------------------------------------------------------------------------------------------------------------------  Chemistries  Recent Labs  Lab 01/13/19 0232  NA 139  K 3.9  CL 103  CO2 24  GLUCOSE 132*  BUN 17  CREATININE 0.70  CALCIUM 9.8  AST  16  ALT 17  ALKPHOS 62  BILITOT 0.6   ------------------------------------------------------------------------------------------------------------------  Cardiac Enzymes No results for input(s): TROPONINI in the last 168 hours. ------------------------------------------------------------------------------------------------------------------  RADIOLOGY:  No results found.    IMPRESSION AND PLAN:   1.  Right big toe and foot diabetic cellulitis secondary to complicated acute paronychia with right big toe perinychial abscess.  The patient has subsequent sepsis as manifested by leukocytosis and initial tachycardia.  The patient will be admitted to a medical bed.  We will continue antibiotic therapy with IV vancomycin and Zosyn.  Will follow wound Gram stain culture and sensitivity.  A podiatry consultation be obtained in a.m.  I notified the podiatrist about the consult.  2.  Hypertensive urgency.  This could be partly related to her throbbing  pain.  It has improved with incision and drainage of her abscess and 2 mg of IV morphine sulfate.  The patient will be placed on as needed IV labetalol and will continue her Coreg, amlodipine and Accupril.  3.  Type 2 diabetes mellitus.  She will be placed on supplement coverage with NovoLog we will continue her basal coverage.  4.  Ongoing tobacco abuse.  I counseled her for smoking cessation and she will receive further counseling here.  5.  DVT prophylaxis.  Subtenons Lovenox.   All the records are reviewed and case discussed with ED provider. The plan of care was discussed in details with the patient (and family). I answered all questions. The patient agreed to proceed with the above mentioned plan. Further management will depend upon hospital course.   CODE STATUS: Full code  TOTAL TIME TAKING CARE OF THIS PATIENT: 50 minutes.    Hannah BeatJan A Kiran Carline M.D on 01/13/2019 at 4:56 AM  Pager - [phone]  After 6pm go to [email or web address] - Social research officer, governmentpassword [email or web address]  [facility] [place] [facility]  Office  [phone]  CC: Primary care physician; [name], [name], FNP   Note: This dictation was prepared with Dragon dictation along with smaller phrase technology. Any transcriptional errors that result from this process are unintentional.

## 2019-01-13 NOTE — ED Notes (Signed)
Pt to room 6 via w/c; swelling/redness noted to rt great toe extending into foot; large pustule noted around nailbed; acuity level changed and MD notified

## 2019-01-13 NOTE — Progress Notes (Signed)
Advanced care plan.  Purpose of the Encounter: CODE STATUS  Parties in Attendance: Patient  Patient's Decision Capacity: Good  Subjective/Patient's story: Julia Brown  is a 48 y.o. African-American female with a known history of type 2 diabetes mellitus, GERD, dyslipidemia and hypertension, presented to the emergency room with acute onset of right big toe swelling with associated erythema, tenderness and warmth since Tuesday with significant worsening and throbbing starting today.  She denies any fever or chills.  No chest pain or dyspnea.  No cough or wheezing.  No sick exposures to COVID-19.  The patient apparently has been on p.o. clindamycin on outpatient basis without significant improvement.  Upon presentation to the emergency room, blood pressure was 153/105 and pulse 104 with otherwise normal vital signs.  Blood pressure later on was higher at 175/119.  Labs revealed leukocytosis otherwise were unremarkable..  The patient was given 900 mg of IV clindamycin.  She was given local lidocaine and had incision of paronychial abscess. The patient will be admitted to a medical bed for further evaluation and management.   Objective/Medical story Patient needs incision and drainage and broad-spectrum IV antibiotics Wound care Podiatry consult   Goals of care determination:  Advance care directives goals of care treatment plan discussed Patient wants everything done which includes CPR, intubation ventilator if the need arises   CODE STATUS: Full code   Time spent discussing advanced care planning: 16 minutes

## 2019-01-14 LAB — GLUCOSE, CAPILLARY
Glucose-Capillary: 140 mg/dL — ABNORMAL HIGH (ref 70–99)
Glucose-Capillary: 129 mg/dL — ABNORMAL HIGH (ref 70–99)

## 2019-01-14 MED ORDER — DIPHENHYDRAMINE HCL 25 MG PO CAPS: 25.0000 mg | ORAL_CAPSULE | Freq: Three times a day (TID) | ORAL | Status: DC | PRN

## 2019-01-14 MED ORDER — OXYCODONE-ACETAMINOPHEN 5-325 MG PO TABS: 1.0000 | ORAL_TABLET | Freq: Four times a day (QID) | ORAL | 0 refills | Status: DC | PRN

## 2019-01-14 MED ORDER — CIPROFLOXACIN HCL 500 MG PO TABS
500.0000 mg | ORAL_TABLET | Freq: Two times a day (BID) | ORAL | Status: DC
  Administered 2019-01-14: 500 mg via ORAL
  Filled 2019-01-14 (×2): qty 1

## 2019-01-14 MED ORDER — CLINDAMYCIN HCL 150 MG PO CAPS
600.0000 mg | ORAL_CAPSULE | Freq: Three times a day (TID) | ORAL | Status: DC
  Administered 2019-01-14 (×2): 600 mg via ORAL
  Filled 2019-01-14 (×3): qty 4

## 2019-01-14 MED ORDER — CIPROFLOXACIN HCL 500 MG PO TABS: 500.0000 mg | ORAL_TABLET | Freq: Two times a day (BID) | ORAL | 0 refills | Status: DC

## 2019-01-14 NOTE — Progress Notes (Addendum)
D: Pt alert and oriented. Pt reports experiencing pain in great toe w/activity rated 4/10, described as throbbing. Pt received meds for pain management, no further c/o pain.  A: Pt received discharge and medication education/information. Pt belongings were gathered and taken with pt upon discharge.   R: Pt verbalized understanding of discharge and medication education/information.  Pt escorted to medical mall lobby via wheelchair by staff where pt is picked up by spouse.   Work Note printed off and reviewed with pt as well.

## 2019-01-14 NOTE — Discharge Summary (Signed)
[facility] [place] [facility]   PATIENT NAME: Julia Brown    MR#:  [id number]  DATE OF BIRTH:  06/02/1970  DATE OF ADMISSION:  01/13/2019 ADMITTING PHYSICIAN: Hannah BeatJan A Mansy, MD  DATE OF DISCHARGE: 01/14/2019  PRIMARY CARE PHYSICIAN: [name], [name], FNP    ADMISSION DIAGNOSIS:  Paronychia of great toe of right foot [L03.031] Cellulitis and abscess of toe of right foot [L03.031, L02.611]  DISCHARGE DIAGNOSIS:  Paronychia Right hallux with cellulitis status post incision and drainage  SECONDARY DIAGNOSIS:   Past Medical History:  Diagnosis Date  . Chicken pox   . Diabetes mellitus   . GERD (gastroesophageal reflux disease)   . Hyperlipidemia   . Hypertension   . UTI (lower urinary tract infection)     HOSPITAL COURSE:  [id number] 48 year old female patient with history of type 2 diabetes mellitus, GERD, hyperlipidemia, hypertension, tobacco abuse currently under hospitalist service  *Acute paronychia with right big toe abscess -Status post incision and drainage by Dr Excell SeltzerBaker -Continue IV vancomycin and Zosyn antibiotic-- change to Cipro and clindamycin. Patient has itching with penicillin group of drug -Podiatry consultation with Dr. Excell SeltzerBaker appreciated.-- Patient will follow-up with Dr. Excell SeltzerBaker as outpatient.  *Uncontrolled hypertension Probably secondary to pain As needed IV labetalol Continue beta-blocker, Norvasc and Accupril  *Tobacco abuse Tobacco cessation counseled to the patient for 6 minutes Nicotine patch offered  *Type 2 diabetes mellitus Diabetic diet with sliding scale coverage with insulin -continue home does insulin, Jardiance and metformin  -DVT prophylaxis subcu Lovenox daily  Patient will discharged to home later today with outpatient follow-up with Dr. Excell SeltzerBaker. Work restrictions and dressing changes per Dr. Bernette RedbirdBaker's recommendation.  discussed with patient and she is agreeable with the plan  CONSULTS OBTAINED:  Treatment  Team:  Gwyneth RevelsFowler, Justin, DPM Rosetta PosnerBaker, Andrew, DPM  DRUG ALLERGIES:   Allergies  Allergen Reactions  . Statins Palpitations  . Bactrim [Sulfamethoxazole-Trimethoprim] Itching    itching  . Penicillins Rash  . Trulicity [Dulaglutide] Nausea Only    DISCHARGE MEDICATIONS:   Allergies as of 01/14/2019      Reactions   Statins Palpitations   Bactrim [sulfamethoxazole-trimethoprim] Itching   itching   Penicillins Rash   Trulicity [dulaglutide] Nausea Only      Medication List    TAKE these medications   Accu-Chek FastClix Lancets Misc   Accu-Chek Guide test strip Generic drug: glucose blood   amLODipine 5 MG tablet Commonly known as: NORVASC Take 1 tablet (5 mg total) by mouth daily.   aspirin EC 81 MG tablet Take 1 tablet (81 mg total) by mouth daily.   Basaglar KwikPen 100 UNIT/ML Sopn Inject 40 units under the skin every morning.   carvedilol 3.125 MG tablet Commonly known as: COREG TAKE 1 TABLET BY MOUTH TWICE DAILY WITH A MEAL   ciprofloxacin 500 MG tablet Commonly known as: CIPRO Take 1 tablet (500 mg total) by mouth 2 (two) times daily.   clindamycin 300 MG capsule Commonly known as: Cleocin Take 1 capsule (300 mg total) by mouth 3 (three) times daily.   Insulin Pen Needle 31G X 5 MM Misc Commonly known as: Unifine Pentips USE AS DIRECTED   Jardiance 10 MG Tabs tablet Generic drug: empagliflozin Take 1 tablet by mouth every morning.   metFORMIN 500 MG tablet Commonly known as: GLUCOPHAGE Start 1000mg  po qpm, increase 500mg /week as tolerated to max dose of 2000mg /day.   omeprazole 40 MG capsule Commonly known as: PRILOSEC Take 1 capsule (40 mg  total) by mouth daily.   oxyCODONE-acetaminophen 5-325 MG tablet Commonly known as: PERCOCET/ROXICET Take 1 tablet by mouth every 6 (six) hours as needed for severe pain.   quinapril 40 MG tablet Commonly known as: ACCUPRIL Take 1 tablet (40 mg total) by mouth daily.   rosuvastatin 10 MG  tablet Commonly known as: Crestor Take 1 tablet (10 mg total) by mouth daily.   VITAMIN C PO Take 1 tablet by mouth.   Vitamin D (Ergocalciferol) 1.25 MG (50000 UT) Caps capsule Commonly known as: DRISDOL Take 1 capsule by mouth once a week.       If you experience worsening of your admission symptoms, develop shortness of breath, life threatening emergency, suicidal or homicidal thoughts you must seek medical attention immediately by calling 911 or calling your MD immediately  if symptoms less severe.  You Must read complete instructions/literature along with all the possible adverse reactions/side effects for all the Medicines you take and that have been prescribed to you. Take any new Medicines after you have completely understood and accept all the possible adverse reactions/side effects.   Please note  You were cared for by a hospitalist during your hospital stay. If you have any questions about your discharge medications or the care you received while you were in the hospital after you are discharged, you can call the unit and asked to speak with the hospitalist on call if the hospitalist that took care of you is not available. Once you are discharged, your primary care physician will handle any further medical issues. Please note that NO REFILLS for any discharge medications will be authorized once you are discharged, as it is imperative that you return to your primary care physician (or establish a relationship with a primary care physician if you do not have one) for your aftercare needs so that they can reassess your need for medications and monitor your lab values. Today   SUBJECTIVE   No new complaints  VITAL SIGNS:  Blood pressure 121/72, pulse 75, temperature 98.4 F (36.9 C), temperature source Oral, resp. rate 16, height 5\' 10"  (1.778 m), weight 106.6 kg, last menstrual period 12/26/2018, SpO2 99 %.  I/O:    Intake/Output Summary (Last 24 hours) at 01/14/2019  1303 Last data filed at 01/14/2019 0940 Gross per 24 hour  Intake 470 ml  Output -  Net 470 ml    PHYSICAL EXAMINATION:  GENERAL:  48 y.o.-year-old patient lying in the bed with no acute distress.  EYES: Pupils equal, round, reactive to light and accommodation. No scleral icterus. Extraocular muscles intact.  HEENT: Head atraumatic, normocephalic. Oropharynx and nasopharynx clear.  NECK:  Supple, no jugular venous distention. No thyroid enlargement, no tenderness.  LUNGS: Normal breath sounds bilaterally, no wheezing, rales,rhonchi or crepitation. No use of accessory muscles of respiration.  CARDIOVASCULAR: S1, S2 normal. No murmurs, rubs, or gallops.  ABDOMEN: Soft, non-tender, non-distended. Bowel sounds present. No organomegaly or mass.  EXTREMITIES: No pedal edema, cyanosis, or clubbing. Right Foot dressing+ NEUROLOGIC: Cranial nerves II through XII are intact. Muscle strength 5/5 in all extremities. Sensation intact. Gait not checked.  PSYCHIATRIC: The patient is alert and oriented x 3.  SKIN: No obvious rash, lesion, or ulcer.   DATA REVIEW:   CBC  Recent Labs  Lab 01/13/19 0524  WBC 12.3*  HGB 13.2  HCT 40.3  PLT 263    Chemistries  Recent Labs  Lab 01/13/19 0232 01/13/19 0524  NA 139 139  K 3.9 4.1  CL 103 104  CO2 24 28  GLUCOSE 132* 110*  BUN 17 16  CREATININE 0.70 0.62  CALCIUM 9.8 9.2  AST 16  --   ALT 17  --   ALKPHOS 62  --   BILITOT 0.6  --     Microbiology Results   Recent Results (from the past 240 hour(s))  Blood culture (routine x 2)     Status: None (Preliminary result)   Collection Time: 01/13/19  2:32 AM   Specimen: BLOOD  Result Value Ref Range Status   Specimen Description BLOOD LEFT ANTECUBITAL  Final   Special Requests   Final    BOTTLES DRAWN AEROBIC AND ANAEROBIC Blood Culture adequate volume   Culture   Final    NO GROWTH 1 DAY Performed at [facility], [address], [place], [place] [address]    Report Status  PENDING  Incomplete  Blood culture (routine x 2)     Status: None (Preliminary result)   Collection Time: 01/13/19  2:32 AM   Specimen: BLOOD  Result Value Ref Range Status   Specimen Description BLOOD BLOOD RIGHT FOREARM  Final   Special Requests   Final    BOTTLES DRAWN AEROBIC AND ANAEROBIC Blood Culture results may not be optimal due to an excessive volume of blood received in culture bottles   Culture   Final    NO GROWTH 1 DAY Performed at [facility], [address] [place], [place] [address]    Report Status PENDING  Incomplete    RADIOLOGY:  Dg Foot 2 Views Right  Result Date: 01/13/2019 CLINICAL DATA:  Chronic nailbed ulceration, paronychia, no bone present on examination EXAM: RIGHT FOOT - 2 VIEW COMPARISON:  04/20/2017 FINDINGS: No fracture or dislocation of the right foot. There is very subtle erosion of the tuft of the distal phalanx of the right great toe which is generally similar in appearance to prior examination dated 04/20/2017. Joint spaces are well preserved. Soft tissue edema about the great toe with bandage material applied. IMPRESSION: There is very subtle erosion of the tuft of the distal phalanx of the right great toe, which is generally similar in appearance to prior examination dated 04/20/2017 although concerning for chronic osteomyelitis. MRI is more sensitive to assess for bone marrow edema and osteomyelitis if suspected. Electronically Signed   By: Lauralyn Primes M.D.   On: 01/13/2019 19:23     CODE STATUS:     Code Status Orders  (From admission, onward)         Start     Ordered   01/13/19 0324  Full code  Continuous     01/13/19 0331        Code Status History    This patient has a current code status but no historical code status.   Advance Care Planning Activity      TOTAL TIME TAKING CARE OF THIS PATIENT: *40* minutes.    Enedina Finner M.D on 01/14/2019 at 1:03 PM  Between 7am to 6pm - Pager - [phone] After 6pm go to  [email or web address] - Social research officer, government  [facility]  Office  [phone]  CC: Primary care physician; Allegra Grana, FNP

## 2019-01-14 NOTE — Discharge Instructions (Signed)
Podiatry Discharge Instructions: 1. Remove dressing to your right foot.  Ok to cleanse and dry clean.  Apply betadine to the wound and betadine gauze to the wound, apply gauze, tape or gauze roll.  Change daily or change for saturation or loosening/ 2. Take antibiotics as prescribed until gone 3. Followup in clinic back in 1 week.  Would like to keep you out of work for the next 2 weeks until your wound heals more 4. Weightbearing as tolerated in a surgical shoe.  Try to stay off your right foot as much as possible and elevate when at rest to reduce swelling.

## 2019-01-14 NOTE — Progress Notes (Addendum)
PODIATRY / FOOT AND ANKLE SURGERY PROGRESS NOTE  Requesting Physician: Valente David, MD  Reason for consult: Right foot infection  Chief Complaint: Right big toe ingrown nail with drainage   HPI: Julia Brown is a 48 y.o. female f/u R hallux total nail avulsion and I&D.  Pt doing well today overall and has mild pain to the R hallux.  She feels much better since admission.  Pt denies n/v/f/c.  PMHx:  Past Medical History:  Diagnosis Date   Chicken pox    Diabetes mellitus    GERD (gastroesophageal reflux disease)    Hyperlipidemia    Hypertension    UTI (lower urinary tract infection)     Surgical Hx:  Past Surgical History:  Procedure Laterality Date   ABLATION  2007   excessive bleeding   CESAREAN SECTION  1994   CHOLECYSTECTOMY  1993   ORIF FEMUR FRACTURE Right    [place]   TUBAL LIGATION     uterine ablation      FHx:  Family History  Problem Relation Age of Onset   Heart disease Father    Hyperlipidemia Father    Diabetes Father    Hyperlipidemia Mother    Hypertension Mother    Schizophrenia Mother    Depression Mother    Diabetes Mother    Heart disease Other    Cancer Maternal Aunt        breast   Diabetes Maternal Grandmother    Diabetes Maternal Grandfather     Social History:  reports that she has been smoking cigarettes. She has a 5.00 pack-year smoking history. She has never used smokeless tobacco. She reports current alcohol use. She reports that she does not use drugs.  Allergies:  Allergies  Allergen Reactions   Statins Palpitations   Bactrim [Sulfamethoxazole-Trimethoprim] Itching    itching   Penicillins Rash   Trulicity [Dulaglutide] Nausea Only    Review of Systems: General ROS: negative Psychological ROS: negative Respiratory ROS: no cough, shortness of breath, or wheezing Cardiovascular ROS: no chest pain or dyspnea on exertion Gastrointestinal ROS: no abdominal pain, change in bowel habits, or  black or bloody stools Musculoskeletal ROS: positive for - joint pain, joint swelling and pain in toe - right Neurological ROS: positive for - numbness/tingling Dermatological ROS: positive for R hallux wound  Medications Prior to Admission  Medication Sig Dispense Refill   ACCU-CHEK FASTCLIX LANCETS MISC   99   ACCU-CHEK GUIDE test strip   99   amLODipine (NORVASC) 5 MG tablet Take 1 tablet (5 mg total) by mouth daily. 30 tablet 5   Ascorbic Acid (VITAMIN C PO) Take 1 tablet by mouth.     aspirin EC 81 MG tablet Take 1 tablet (81 mg total) by mouth daily. 90 tablet 3   carvedilol (COREG) 3.125 MG tablet TAKE 1 TABLET BY MOUTH TWICE DAILY WITH A MEAL 180 tablet 6   clindamycin (CLEOCIN) 300 MG capsule Take 1 capsule (300 mg total) by mouth 3 (three) times daily. 30 capsule 0   Insulin Glargine (BASAGLAR KWIKPEN) 100 UNIT/ML SOPN Inject 40 units under the skin every morning. 60 mL 2   Insulin Pen Needle (UNIFINE PENTIPS) 31G X 5 MM MISC USE AS DIRECTED 200 each 6   JARDIANCE 10 MG TABS tablet Take 1 tablet by mouth every morning. 90 tablet 1   metFORMIN (GLUCOPHAGE) 500 MG tablet Start  po qpm, increase /week as tolerated to max dose of /day. 180 tablet 3  omeprazole (PRILOSEC) 40 MG capsule Take 1 capsule (40 mg total) by mouth daily. 90 capsule 2   quinapril (ACCUPRIL) 40 MG tablet Take 1 tablet (40 mg total) by mouth daily. 90 tablet 1   rosuvastatin (CRESTOR) 10 MG tablet Take 1 tablet (10 mg total) by mouth daily. 90 tablet 3   Vitamin D, Ergocalciferol, (DRISDOL) 1.25 MG (50000 UT) CAPS capsule Take 1 capsule by mouth once a week. 12 capsule 8    Physical Exam: General: Alert and oriented.  No apparent distress.  Vascular: DP/PT pulses +1 bilateral, CFT intact to digits bilateral. Mild nonpitting bilateral lower extremity edema.  Mild erythema and edema to the right hallux overall decrease since yesterday.  Neuro: Light touch sensation reduced to  bilateral lower extremities.  Derm: R hallux s/p total nail avulsion and I&D.  Serous drainage present today, no purulence expressed, decreased erythema and edema present since procedure.  No probing to bone, only subcutaneous tissue present.     MSK: Pain on palpation right hallux nail.  Results for orders placed or performed during the hospital encounter of 01/13/19 (from the past 48 hour(s))  CBC     Status: Abnormal   Collection Time: 01/13/19  2:32 AM  Result Value Ref Range   WBC 13.9 (H) 4.0 - 10.5 K/uL   RBC 4.84 3.87 - 5.11 MIL/uL   Hemoglobin 14.3 12.0 - 15.0 g/dL   HCT 40.943.7 81.136.0 - 91.446.0 %   MCV 90.3 80.0 - 100.0 fL   MCH 29.5 26.0 - 34.0 pg   MCHC 32.7 30.0 - 36.0 g/dL   RDW 78.212.3 95.611.5 - 21.315.5 %   Platelets 271 150 - 400 K/uL   nRBC 0.0 0.0 - 0.2 %    Comment: Performed at [facility], [address] [place], [place] [address]  Comprehensive metabolic panel     Status: Abnormal   Collection Time: 01/13/19  2:32 AM  Result Value Ref Range   Sodium 139 135 - 145 mmol/L   Potassium 3.9 3.5 - 5.1 mmol/L   Chloride 103 98 - 111 mmol/L   CO2 24 22 - 32 mmol/L   Glucose, Bld 132 (H) 70 - 99 mg/dL   BUN 17 6 - 20 mg/dL   Creatinine, Ser 8.460.70 0.44 - 1.00 mg/dL   Calcium 9.8 8.9 - 96.210.3 mg/dL   Total Protein 8.3 (H) 6.5 - 8.1 g/dL   Albumin 4.6 3.5 - 5.0 g/dL   AST 16 15 - 41 U/L   ALT 17 0 - 44 U/L   Alkaline Phosphatase 62 38 - 126 U/L   Total Bilirubin 0.6 0.3 - 1.2 mg/dL   GFR calc non Af Amer >60 >60 mL/min   GFR calc Af Amer >60 >60 mL/min   Anion gap 12 5 - 15    Comment: Performed at [facility], [address] [place], [place] [address]  Blood culture (routine x 2)     Status: None (Preliminary result)   Collection Time: 01/13/19  2:32 AM   Specimen: BLOOD  Result Value Ref Range   Specimen Description BLOOD LEFT ANTECUBITAL    Special Requests      BOTTLES DRAWN AEROBIC AND ANAEROBIC Blood Culture adequate volume   Culture      NO  GROWTH 1 DAY Performed at [facility], [address] [place], [place] [address]    Report Status PENDING   Blood culture (routine x 2)     Status: None (Preliminary  result)   Collection Time: 01/13/19  2:32 AM   Specimen: BLOOD  Result Value Ref Range   Specimen Description BLOOD BLOOD RIGHT FOREARM    Special Requests      BOTTLES DRAWN AEROBIC AND ANAEROBIC Blood Culture results may not be optimal due to an excessive volume of blood received in culture bottles   Culture      NO GROWTH 1 DAY Performed at [facility], [address] [place], [place] [address]    Report Status PENDING   Hemoglobin A1c     Status: Abnormal   Collection Time: 01/13/19  5:24 AM  Result Value Ref Range   Hgb A1c MFr Bld 7.1 (H) 4.8 - 5.6 %    Comment: (NOTE) Pre diabetes:          5.7%-6.4% Diabetes:              >6.4% Glycemic control for   <7.0% adults with diabetes    Mean Plasma Glucose 157.07 mg/dL    Comment: Performed at [facility] [address] , [place] [address]  HIV Antibody (routine testing w rflx)     Status: None   Collection Time: 01/13/19  5:24 AM  Result Value Ref Range   HIV Screen 4th Generation wRfx NON REACTIVE NON REACTIVE    Comment: Performed at [facility] [address], [place], [place] [address]  Basic metabolic panel     Status: Abnormal   Collection Time: 01/13/19  5:24 AM  Result Value Ref Range   Sodium 139 135 - 145 mmol/L   Potassium 4.1 3.5 - 5.1 mmol/L   Chloride 104 98 - 111 mmol/L   CO2 28 22 - 32 mmol/L   Glucose, Bld 110 (H) 70 - 99 mg/dL   BUN 16 6 - 20 mg/dL   Creatinine, Ser 0.62 0.44 - 1.00 mg/dL   Calcium 9.2 8.9 - 10.3 mg/dL   GFR calc non Af Amer >60 >60 mL/min   GFR calc Af Amer >60 >60 mL/min   Anion gap 7 5 - 15    Comment: Performed at [facility], [address] [place], [place] [address]  CBC     Status: Abnormal   Collection Time: 01/13/19  5:24 AM  Result Value Ref Range     WBC 12.3 (H) 4.0 - 10.5 K/uL   RBC 4.50 3.87 - 5.11 MIL/uL   Hemoglobin 13.2 12.0 - 15.0 g/dL   HCT 40.3 36.0 - 46.0 %   MCV 89.6 80.0 - 100.0 fL   MCH 29.3 26.0 - 34.0 pg   MCHC 32.8 30.0 - 36.0 g/dL   RDW 12.3 11.5 - 15.5 %   Platelets 263 150 - 400 K/uL   nRBC 0.0 0.0 - 0.2 %    Comment: Performed at [facility], [address] [place], [place] [address]  Glucose, capillary     Status: None   Collection Time: 01/13/19  7:47 AM  Result Value Ref Range   Glucose-Capillary 85 70 - 99 mg/dL  Glucose, capillary     Status: Abnormal   Collection Time: 01/13/19 11:42 AM  Result Value Ref Range   Glucose-Capillary 104 (H) 70 - 99 mg/dL  Glucose, capillary     Status: Abnormal   Collection Time: 01/13/19  4:41 PM  Result Value Ref Range   Glucose-Capillary 206 (H) 70 - 99 mg/dL  Glucose, capillary     Status: Abnormal   Collection  Time: 01/13/19  9:01 PM  Result Value Ref Range   Glucose-Capillary 183 (H) 70 - 99 mg/dL   Comment 1 Notify RN   Glucose, capillary     Status: Abnormal   Collection Time: 01/14/19  7:52 AM  Result Value Ref Range   Glucose-Capillary 140 (H) 70 - 99 mg/dL  Glucose, capillary     Status: Abnormal   Collection Time: 01/14/19 12:42 PM  Result Value Ref Range   Glucose-Capillary 129 (H) 70 - 99 mg/dL   Dg Foot 2 Views Right  Result Date: 01/13/2019 CLINICAL DATA:  Chronic nailbed ulceration, paronychia, no bone present on examination EXAM: RIGHT FOOT - 2 VIEW COMPARISON:  04/20/2017 FINDINGS: No fracture or dislocation of the right foot. There is very subtle erosion of the tuft of the distal phalanx of the right great toe which is generally similar in appearance to prior examination dated 04/20/2017. Joint spaces are well preserved. Soft tissue edema about the great toe with bandage material applied. IMPRESSION: There is very subtle erosion of the tuft of the distal phalanx of the right great toe, which is generally similar in appearance to  prior examination dated 04/20/2017 although concerning for chronic osteomyelitis. MRI is more sensitive to assess for bone marrow edema and osteomyelitis if suspected. Electronically Signed   By: Lauralyn Primes M.D.   On: 01/13/2019 19:23    Blood pressure 121/72, pulse 75, temperature 98.4 F (36.9 C), temperature source Oral, resp. rate 16, height 5\' 10"  (1.778 m), weight 106.6 kg, last menstrual period 12/26/2018, SpO2 99 %.   Assessment 1. Paronychia right hallux with cellulitis 2. Abscess right hallux 3. Diabetes type 2 with polyneuropathy  Plan -Examine both feet. -X-ray results reviewed and discussed with patient in detail.  No clinical signs for osteomyelitis.  X-rays as noted by radiologist appear to be similar to previous x-rays that were taken on 04/20/2017. -Right hallux appears to be much improved since incision and drainage with total nail avulsion.  Decreased erythema and edema present, moderate serous drainage present. -Redressed with Betadine gauze, Kling, Ace wrap.  Patient instructed on daily dressing changes or changes if increased saturation or loosening occurs.  Instructions placed in chart upon discharge. -Continue weightbearing as tolerated in surgical shoe to the right foot.  Try to stay off the right foot is much as possible.  Elevate right lower extremity when at rest.  Instructions placed in chart for this as well. -Appreciate recommendations per medicine for broad-spectrum antibiotics orally for home. -Discussed with patient that she will need to take at least 2 weeks off of work until this area heals up more.  Podiatry team to sign off at this time.  Discharge instructions placed in chart.  Patient to follow-up in 1 week.  04/22/2017 01/14/2019, 3:16 PM

## 2019-01-16 ENCOUNTER — Ambulatory Visit: Payer: BC Managed Care – PPO | Admitting: Podiatry

## 2019-01-16 ENCOUNTER — Encounter: Payer: Self-pay | Admitting: Family Medicine

## 2019-01-16 NOTE — Telephone Encounter (Signed)
Please schedule follow up after she sees podiatry

## 2019-01-18 ENCOUNTER — Inpatient Hospital Stay: Admission: RE | Admit: 2019-01-18 | Payer: BC Managed Care – PPO | Source: Ambulatory Visit

## 2019-01-18 LAB — CULTURE, BLOOD (ROUTINE X 2)
Culture: NO GROWTH
Culture: NO GROWTH
Special Requests: ADEQUATE

## 2019-01-19 ENCOUNTER — Other Ambulatory Visit: Payer: Self-pay

## 2019-01-19 ENCOUNTER — Ambulatory Visit (INDEPENDENT_AMBULATORY_CARE_PROVIDER_SITE_OTHER): Payer: BC Managed Care – PPO | Admitting: Family Medicine

## 2019-01-19 VITALS — Ht 70.0 in | Wt 239.0 lb

## 2019-01-19 DIAGNOSIS — L089 Local infection of the skin and subcutaneous tissue, unspecified: Secondary | ICD-10-CM | POA: Diagnosis not present

## 2019-01-19 NOTE — Progress Notes (Signed)
Patient ID: Julia Brown, female   DOB: 04-Jul-1970, 48 y.o.   MRN: [id number]    Virtual Visit via VIDEO Note  This visit type was conducted due to national recommendations for restrictions regarding the COVID-19 pandemic (e.g. social distancing).  This format is felt to be most appropriate for this patient at this time.  All issues noted in this document were discussed and addressed.  No physical exam was performed (except for noted visual exam findings with Video Visits).   I connected with Rudene Anda today at 11:00 AM EDT by a video enabled telemedicine application or telephone and verified that I am speaking with the correct person using two identifiers. Location patient: home Location provider: work or home office Persons participating in the virtual visit: patient, provider  I discussed the limitations, risks, security and privacy concerns of performing an evaluation and management service by video and the availability of in person appointments. I also discussed with the patient that there may be a patient responsible charge related to this service. The patient expressed understanding and agreed to proceed.  HPI: Patient and I connected via video to follow-up on hospital admission.  Was admitted for 1 night due to cellulitis and abscess of great toe.  She had been started on oral antibiotics by me, but the area became worse and she went to the emergency room.  She was admitted for treatment.  Podiatry ended up seeing her in the hospital and drained abscess around the toenail and removed toenail as well.  She followed up with podiatry yesterday on 10/28 and was told the area is healing well.  She will remain out of work for at least the 2 to 3 weeks to allow for time to heal.  His next follow-up with podiatry on 1118.  Patient overall states she is feeling well.  She is currently finishing clindamycin prescribed by me and also hospital discharge with course of Cipro.  She is eating and  drinking well, no fevers, redness seems to have resolved and pain in foot is improving each day.    ROS: See pertinent positives and negatives per HPI.  Past Medical History:  Diagnosis Date  . Chicken pox   . Diabetes mellitus   . GERD (gastroesophageal reflux disease)   . Hyperlipidemia   . Hypertension   . UTI (lower urinary tract infection)     Past Surgical History:  Procedure Laterality Date  . ABLATION  2007   excessive bleeding  . CESAREAN SECTION  1994  . CHOLECYSTECTOMY  1993  . ORIF FEMUR FRACTURE Right    [place]  . TUBAL LIGATION    . uterine ablation      Family History  Problem Relation Age of Onset  . Heart disease Father   . Hyperlipidemia Father   . Diabetes Father   . Hyperlipidemia Mother   . Hypertension Mother   . Schizophrenia Mother   . Depression Mother   . Diabetes Mother   . Heart disease Other   . Cancer Maternal Aunt        breast  . Diabetes Maternal Grandmother   . Diabetes Maternal Grandfather     Social History   Tobacco Use  . Smoking status: Current Every Day Smoker    Packs/day: 0.50    Years: 10.00    Pack years: 5.00    Types: Cigarettes  . Smokeless tobacco: Never Used  . Tobacco comment: smoked < 1 pack a day  Substance Use Topics  .  Alcohol use: Yes    Alcohol/week: 0.0 standard drinks    Comment: weekend 3-4drinks   Hospital admission reviewed by me via chart in epic   Current Outpatient Medications:  .  ACCU-CHEK FASTCLIX LANCETS MISC, , Disp: , Rfl: 99 .  ACCU-CHEK GUIDE test strip, , Disp: , Rfl: 99 .  amLODipine (NORVASC) 5 MG tablet, Take 1 tablet (5 mg total) by mouth daily., Disp: 30 tablet, Rfl: 5 .  Ascorbic Acid (VITAMIN C PO), Take 1 tablet by mouth., Disp: , Rfl:  .  aspirin EC 81 MG tablet, Take 1 tablet (81 mg total) by mouth daily., Disp: 90 tablet, Rfl: 3 .  carvedilol (COREG) 3.125 MG tablet, TAKE 1 TABLET BY MOUTH TWICE DAILY WITH A MEAL, Disp: 180 tablet, Rfl: 6 .  ciprofloxacin (CIPRO)  500 MG tablet, Take 1 tablet (500 mg total) by mouth 2 (two) times daily., Disp: 20 tablet, Rfl: 0 .  clindamycin (CLEOCIN) 300 MG capsule, Take 1 capsule (300 mg total) by mouth 3 (three) times daily., Disp: 30 capsule, Rfl: 0 .  Insulin Glargine (BASAGLAR KWIKPEN) 100 UNIT/ML SOPN, Inject 40 units under the skin every morning., Disp: 60 mL, Rfl: 2 .  Insulin Pen Needle (UNIFINE PENTIPS) 31G X 5 MM MISC, USE AS DIRECTED, Disp: 200 each, Rfl: 6 .  JARDIANCE 10 MG TABS tablet, Take 1 tablet by mouth every morning., Disp: 90 tablet, Rfl: 1 .  metFORMIN (GLUCOPHAGE) 500 MG tablet, Start 1000mg  po qpm, increase 500mg /week as tolerated to max dose of 2000mg /day., Disp: 180 tablet, Rfl: 3 .  omeprazole (PRILOSEC) 40 MG capsule, Take 1 capsule (40 mg total) by mouth daily., Disp: 90 capsule, Rfl: 2 .  oxyCODONE-acetaminophen (PERCOCET/ROXICET) 5-325 MG tablet, Take 1 tablet by mouth every 6 (six) hours as needed for severe pain., Disp: 20 tablet, Rfl: 0 .  quinapril (ACCUPRIL) 40 MG tablet, Take 1 tablet (40 mg total) by mouth daily., Disp: 90 tablet, Rfl: 1 .  rosuvastatin (CRESTOR) 10 MG tablet, Take 1 tablet (10 mg total) by mouth daily., Disp: 90 tablet, Rfl: 3 .  Vitamin D, Ergocalciferol, (DRISDOL) 1.25 MG (50000 UT) CAPS capsule, Take 1 capsule by mouth once a week., Disp: 12 capsule, Rfl: 8  EXAM:  GENERAL: alert, oriented, appears well and in no acute distress  HEENT: atraumatic, conjunttiva clear, no obvious abnormalities on inspection of external nose and ears  NECK: normal movements of the head and neck  LUNGS: on inspection no signs of respiratory distress, breathing rate appears normal, no obvious gross SOB, gasping or wheezing  CV: no obvious cyanosis  MS: moves all visible extremities without noticeable abnormality  TOE: Toe dressing currently in place.  PSYCH/NEURO: pleasant and cooperative, no obvious depression or anxiety, speech and thought processing grossly intact   ASSESSMENT AND PLAN:  Discussed the following assessment and plan:  Toe infection right great toe-she will continue treatment plan as laid out by her daughter upon discharge.  She will follow up with podiatry as planned.  She will do daily dressing changes as recommended by podiatry.  She will let know if anything changes or symptoms worsen in any way.   I discussed the assessment and treatment plan with the patient. The patient was provided an opportunity to ask questions and all were answered. The patient agreed with the plan and demonstrated an understanding of the instructions.   The patient was advised to call back or seek an in-person evaluation if the symptoms worsen or  if the condition fails to improve as anticipated.  Jodelle Green, FNP

## 2019-01-24 ENCOUNTER — Encounter: Payer: Self-pay | Admitting: Family Medicine

## 2019-01-24 DIAGNOSIS — B373 Candidiasis of vulva and vagina: Secondary | ICD-10-CM

## 2019-01-24 DIAGNOSIS — B3731 Acute candidiasis of vulva and vagina: Secondary | ICD-10-CM

## 2019-01-24 MED ORDER — FLUCONAZOLE 150 MG PO TABS: ORAL_TABLET | ORAL | 0 refills | Status: DC

## 2019-01-28 ENCOUNTER — Emergency Department
Admission: EM | Admit: 2019-01-28 | Discharge: 2019-01-28 | Disposition: A | Discharge status: Home / Self Care | Payer: BC Managed Care – PPO | Attending: Emergency Medicine | Admitting: Emergency Medicine

## 2019-01-28 ENCOUNTER — Emergency Department: Payer: BC Managed Care – PPO

## 2019-01-28 ENCOUNTER — Other Ambulatory Visit: Payer: Self-pay

## 2019-01-28 DIAGNOSIS — T886XXA Anaphylactic reaction due to adverse effect of correct drug or medicament properly administered, initial encounter: Secondary | ICD-10-CM | POA: Insufficient documentation

## 2019-01-28 DIAGNOSIS — Z79899 Other long term (current) drug therapy: Secondary | ICD-10-CM | POA: Insufficient documentation

## 2019-01-28 DIAGNOSIS — K2900 Acute gastritis without bleeding: Secondary | ICD-10-CM | POA: Insufficient documentation

## 2019-01-28 DIAGNOSIS — R112 Nausea with vomiting, unspecified: Secondary | ICD-10-CM | POA: Diagnosis present

## 2019-01-28 DIAGNOSIS — T508X5A Adverse effect of diagnostic agents, initial encounter: Secondary | ICD-10-CM

## 2019-01-28 DIAGNOSIS — Z7982 Long term (current) use of aspirin: Secondary | ICD-10-CM | POA: Insufficient documentation

## 2019-01-28 DIAGNOSIS — F17210 Nicotine dependence, cigarettes, uncomplicated: Secondary | ICD-10-CM | POA: Diagnosis not present

## 2019-01-28 DIAGNOSIS — Y69 Unspecified misadventure during surgical and medical care: Secondary | ICD-10-CM | POA: Insufficient documentation

## 2019-01-28 DIAGNOSIS — I10 Essential (primary) hypertension: Secondary | ICD-10-CM | POA: Insufficient documentation

## 2019-01-28 DIAGNOSIS — Z794 Long term (current) use of insulin: Secondary | ICD-10-CM | POA: Diagnosis not present

## 2019-01-28 DIAGNOSIS — E119 Type 2 diabetes mellitus without complications: Secondary | ICD-10-CM | POA: Insufficient documentation

## 2019-01-28 LAB — URINALYSIS, COMPLETE (UACMP) WITH MICROSCOPIC
Bacteria, UA: NONE SEEN
Bilirubin Urine: NEGATIVE
Glucose, UA: >=500 mg/dL — AB
Hgb urine dipstick: NEGATIVE
Ketones, ur: 20 mg/dL — AB
Leukocytes,Ua: NEGATIVE
Nitrite: NEGATIVE
Protein, ur: NEGATIVE mg/dL
Specific Gravity, Urine: 1.035 — ABNORMAL HIGH (ref 1.005–1.030)
pH: 6 (ref 5.0–8.0)

## 2019-01-28 LAB — CBC
HCT: 44.6 % (ref 36.0–46.0)
Hemoglobin: 15.3 g/dL — ABNORMAL HIGH (ref 12.0–15.0)
MCH: 29.4 pg (ref 26.0–34.0)
MCHC: 34.3 g/dL (ref 30.0–36.0)
MCV: 85.8 fL (ref 80.0–100.0)
Platelets: 373 10*3/uL (ref 150–400)
RBC: 5.2 MIL/uL — ABNORMAL HIGH (ref 3.87–5.11)
RDW: 11.9 % (ref 11.5–15.5)
WBC: 11.2 10*3/uL — ABNORMAL HIGH (ref 4.0–10.5)
nRBC: 0 % (ref 0.0–0.2)

## 2019-01-28 LAB — COMPREHENSIVE METABOLIC PANEL
ALT: 23 U/L (ref 0–44)
AST: 18 U/L (ref 15–41)
Albumin: 4.8 g/dL (ref 3.5–5.0)
Alkaline Phosphatase: 61 U/L (ref 38–126)
Anion gap: 13 (ref 5–15)
BUN: 13 mg/dL (ref 6–20)
CO2: 22 mmol/L (ref 22–32)
Calcium: 9.9 mg/dL (ref 8.9–10.3)
Chloride: 103 mmol/L (ref 98–111)
Creatinine, Ser: 0.67 mg/dL (ref 0.44–1.00)
GFR calc Af Amer: >60 mL/min (ref >60)
GFR calc non Af Amer: >60 mL/min (ref >60)
Glucose, Bld: 176 mg/dL — ABNORMAL HIGH (ref 70–99)
Potassium: 4.5 mmol/L (ref 3.5–5.1)
Sodium: 138 mmol/L (ref 135–145)
Total Bilirubin: 1.1 mg/dL (ref 0.3–1.2)
Total Protein: 8.3 g/dL — ABNORMAL HIGH (ref 6.5–8.1)

## 2019-01-28 LAB — POC URINE PREG, ED: Preg Test, Ur: NEGATIVE

## 2019-01-28 LAB — LIPASE, BLOOD: Lipase: 21 U/L (ref 11–51)

## 2019-01-28 MED ORDER — HYDROMORPHONE HCL 1 MG/ML IJ SOLN
1.0000 mg | Freq: Once | INTRAMUSCULAR | Status: AC
  Administered 2019-01-28: 10:00:00 1 mg via INTRAVENOUS
  Filled 2019-01-28: qty 1

## 2019-01-28 MED ORDER — SODIUM CHLORIDE 0.9% FLUSH
3.0000 mL | Freq: Once | INTRAVENOUS | Status: AC
  Administered 2019-01-28: 3 mL via INTRAVENOUS

## 2019-01-28 MED ORDER — SODIUM CHLORIDE 0.9 % IV BOLUS
1000.0000 mL | Freq: Once | INTRAVENOUS | Status: AC
  Administered 2019-01-28: 10:00:00 1000 mL via INTRAVENOUS

## 2019-01-28 MED ORDER — DIPHENHYDRAMINE HCL 50 MG/ML IJ SOLN
25.0000 mg | Freq: Once | INTRAMUSCULAR | Status: AC
  Administered 2019-01-28: 25 mg via INTRAVENOUS
  Filled 2019-01-28: qty 1

## 2019-01-28 MED ORDER — EPINEPHRINE 0.3 MG/0.3ML IJ SOAJ
0.3000 mg | Freq: Once | INTRAMUSCULAR | Status: AC
  Administered 2019-01-28: 12:00:00 0.3 mg via INTRAMUSCULAR
  Filled 2019-01-28: qty 0.3

## 2019-01-28 MED ORDER — DIPHENHYDRAMINE HCL 50 MG/ML IJ SOLN
25.0000 mg | Freq: Once | INTRAMUSCULAR | Status: AC
  Administered 2019-01-28: 10:00:00 25 mg via INTRAVENOUS
  Filled 2019-01-28: qty 1

## 2019-01-28 MED ORDER — METOCLOPRAMIDE HCL 10 MG PO TABS: 10.0000 mg | ORAL_TABLET | Freq: Four times a day (QID) | ORAL | 0 refills | Status: DC | PRN

## 2019-01-28 MED ORDER — IOHEXOL 300 MG/ML  SOLN
100.0000 mL | Freq: Once | INTRAMUSCULAR | Status: AC | PRN
  Administered 2019-01-28: 100 mL via INTRAVENOUS

## 2019-01-28 MED ORDER — METHYLPREDNISOLONE SODIUM SUCC 125 MG IJ SOLR
125.0000 mg | Freq: Once | INTRAMUSCULAR | Status: AC
  Administered 2019-01-28: 12:00:00 125 mg via INTRAVENOUS
  Filled 2019-01-28: qty 2

## 2019-01-28 MED ORDER — SENNOSIDES-DOCUSATE SODIUM 8.6-50 MG PO TABS: 2.0000 | ORAL_TABLET | Freq: Two times a day (BID) | ORAL | 0 refills | Status: DC

## 2019-01-28 MED ORDER — FAMOTIDINE IN NACL 20-0.9 MG/50ML-% IV SOLN
20.0000 mg | Freq: Once | INTRAVENOUS | Status: AC
  Administered 2019-01-28: 12:00:00 20 mg via INTRAVENOUS
  Filled 2019-01-28: qty 50

## 2019-01-28 MED ORDER — ONDANSETRON HCL 4 MG/2ML IJ SOLN
4.0000 mg | Freq: Once | INTRAMUSCULAR | Status: AC | PRN
  Administered 2019-01-28: 4 mg via INTRAVENOUS
  Filled 2019-01-28: qty 2

## 2019-01-28 MED ORDER — DIPHENHYDRAMINE HCL 25 MG PO CAPS: 50.0000 mg | ORAL_CAPSULE | Freq: Four times a day (QID) | ORAL | 0 refills | Status: DC | PRN

## 2019-01-28 MED ORDER — METOCLOPRAMIDE HCL 5 MG/ML IJ SOLN
10.0000 mg | Freq: Once | INTRAMUSCULAR | Status: AC
  Administered 2019-01-28: 10 mg via INTRAVENOUS
  Filled 2019-01-28: qty 2

## 2019-01-28 NOTE — ED Notes (Signed)
Report received - pt to ct.

## 2019-01-28 NOTE — Discharge Instructions (Addendum)
Your lab tests and CT scan were all okay today.    You had an allergic reaction to the IV contrast that was used for the CT scan.  In the future, please advise all healthcare providers that you are allergic to CT contrast to avoid repeat exposure.

## 2019-01-28 NOTE — ED Triage Notes (Signed)
Pt states that she had an episode Tuesday of nausea with vomiting, resolved after zofran, this am was awakened with severe nausea and 6-7 episodes zofran at 0600, pt recently discharged 2 weeks ago for foot infection, completed antibiotics tuesday

## 2019-01-28 NOTE — ED Provider Notes (Signed)
[facility] Emergency Department Provider Note  ____________________________________________  Time seen: Approximately 1:48 PM  I have reviewed the triage vital signs and the nursing notes.   HISTORY  Chief Complaint Emesis and Abdominal Pain    HPI Julia Brown is a 48 y.o. female with a history of diabetes GERD hypertension hyperlipidemia who comes the ED complaining of epigastric pain, nausea and vomiting that started  this morning about 6:00 AM, rapid onset.  Associated with about 6 episodes of vomiting.  Nonradiating, no aggravating or alleviating factors.  Unable to eat or drink anything.     Past Medical History:  Diagnosis Date  . Chicken pox   . Diabetes mellitus   . GERD (gastroesophageal reflux disease)   . Hyperlipidemia   . Hypertension   . UTI (lower urinary tract infection)      Patient Active Problem List   Diagnosis Date Noted  . Abscess or cellulitis of foot 01/13/2019  . Hepatic steatosis 09/14/2018  . Chronic low back pain with right-sided sciatica 12/13/2017  . Vaginal itching 12/13/2017  . Palpitations 05/20/2016  . Anxiety state 01/14/2016  . Vitamin D deficiency 05/07/2015  . Essential hypertension 01/31/2015  . Vulvovaginal candidiasis 11/01/2014  . Muscle spasm 04/26/2014  . Routine general medical examination at a health care facility 08/28/2013  . Adjustment disorder with mixed anxiety and depressed mood 08/28/2013  . Tobacco abuse 08/28/2013  . Obesity (BMI 30-39.9) 08/28/2013  . GERD (gastroesophageal reflux disease) 08/12/2012  . Hair loss 08/12/2012  . Hyperlipidemia 11/10/2010  . Diabetes type 2, controlled (HCC) 11/10/2010     Past Surgical History:  Procedure Laterality Date  . ABLATION  2007   excessive bleeding  . CESAREAN SECTION  1994  . CHOLECYSTECTOMY  1993  . ORIF FEMUR FRACTURE Right    [place]  . TUBAL LIGATION    . uterine ablation       Prior to Admission medications    Medication Sig Start Date End Date Taking? Authorizing Provider  ACCU-CHEK FASTCLIX LANCETS MISC  01/19/17   [provider]  ACCU-CHEK GUIDE test strip  01/19/17   [provider]  amLODipine (NORVASC) 5 MG tablet Take 1 tablet (5 mg total) by mouth daily. 11/21/18   [name], [name], FNP  Ascorbic Acid (VITAMIN C PO) Take 1 tablet by mouth.    [provider]  aspirin EC 81 MG tablet Take 1 tablet (81 mg total) by mouth daily. 06/10/16   Almond LintIngal, Aileen, MD  carvedilol (COREG) 3.125 MG tablet TAKE 1 TABLET BY MOUTH TWICE DAILY WITH A MEAL 01/05/17   [name], [name], FNP  ciprofloxacin (CIPRO) 500 MG tablet Take 1 tablet (500 mg total) by mouth 2 (two) times daily. 01/14/19   Enedina FinnerPatel, Sona, MD  clindamycin (CLEOCIN) 300 MG capsule Take 1 capsule (300 mg total) by mouth 3 (three) times daily. 01/11/19   Tracey HarriesGuse, Lauren M, FNP  diphenhydrAMINE (BENADRYL) 25 mg capsule Take 2 capsules (50 mg total) by mouth every 6 (six) hours as needed. 01/28/19   Sharman CheekStafford, Abdikadir Fohl, MD  fluconazole (DIFLUCAN) 150 MG tablet Take first dose on day 1, take second dose on day 4 01/24/19   Tracey HarriesGuse, Lauren M, FNP  Insulin Glargine [facility](BASAGLAR KWIKPEN) 100 UNIT/ML SOPN Inject 40 units under the skin every morning. 09/13/18   [name], [name], FNP  Insulin Pen Needle (UNIFINE PENTIPS) 31G X 5 MM MISC USE AS DIRECTED 07/26/18   [name], [name], FNP  JARDIANCE  10 MG TABS tablet Take 1 tablet by mouth every morning. 08/30/18   Allegra Grana, FNP  metFORMIN (GLUCOPHAGE) 500 MG tablet Start 1000mg  po qpm, increase 500mg /week as tolerated to max dose of 2000mg /day. 09/09/18   , FNP  metoCLOPramide (REGLAN) 10 MG tablet Take 1 tablet (10 mg total) by mouth every 6 (six) hours as needed. 01/28/19   09/11/18, MD  omeprazole (PRILOSEC) 40 MG capsule Take 1 capsule (40 mg total) by mouth daily. 12/19/18   13/7/20, FNP  oxyCODONE-acetaminophen (PERCOCET/ROXICET) 5-325 MG tablet  Take 1 tablet by mouth every 6 (six) hours as needed for severe pain. 01/14/19   12/21/18, MD  quinapril (ACCUPRIL) 40 MG tablet Take 1 tablet (40 mg total) by mouth daily. 08/10/18   01/16/19, FNP  rosuvastatin (CRESTOR) 10 MG tablet Take 1 tablet (10 mg total) by mouth daily. 02/28/18   08/12/18, FNP  senna-docusate (SENOKOT-S) 8.6-50 MG tablet Take 2 tablets by mouth 2 (two) times daily. 01/28/19   Allegra Grana, MD  Vitamin D, Ergocalciferol, (DRISDOL) 1.25 MG (50000 UT) CAPS capsule Take 1 capsule by mouth once a week. 02/23/18   13/7/20, FNP     Allergies Contrast media [iodinated diagnostic agents], Statins, Bactrim [sulfamethoxazole-trimethoprim], Penicillins, and Trulicity [dulaglutide]   Family History  Problem Relation Age of Onset  . Heart disease Father   . Hyperlipidemia Father   . Diabetes Father   . Hyperlipidemia Mother   . Hypertension Mother   . Schizophrenia Mother   . Depression Mother   . Diabetes Mother   . Heart disease Other   . Cancer Maternal Aunt        breast  . Diabetes Maternal Grandmother   . Diabetes Maternal Grandfather     Social History Social History   Tobacco Use  . Smoking status: Current Every Day Smoker    Packs/day: 0.50    Years: 10.00    Pack years: 5.00    Types: Cigarettes  . Smokeless tobacco: Never Used  . Tobacco comment: smoked < 1 pack a day  Substance Use Topics  . Alcohol use: Yes    Alcohol/week: 0.0 standard drinks    Comment: weekend 3-4drinks  . Drug use: No    Review of Systems  Constitutional:   No fever or chills.  ENT:   No sore throat. No rhinorrhea. Cardiovascular:   No chest pain or syncope. Respiratory:   No dyspnea or cough. Gastrointestinal:   Positive as above for abdominal pain and vomiting Musculoskeletal:   Negative for focal pain or swelling All other systems reviewed and are negative except as documented above in ROS and  HPI.  ____________________________________________   PHYSICAL EXAM:  VITAL SIGNS: ED Triage Vitals  Enc Vitals Group     BP 01/28/19 0827 (!) 165/98     Pulse Rate 01/28/19 0827 93     Resp 01/28/19 0827 20     Temp 01/28/19 0827 98.3 F (36.8 C)     Temp Source 01/28/19 0827 Oral     SpO2 01/28/19 0827 98 %     Weight 01/28/19 0828 239 lb (108.4 kg)     Height 01/28/19 0828 5\' 10"  (1.778 m)     Head Circumference --      Peak Flow --      Pain Score 01/28/19 0827 6     Pain Loc --      Pain Edu? --  Excl. in GC? --     Vital signs reviewed, nursing assessments reviewed.   Constitutional:   Alert and oriented. Non-toxic appearance. Eyes:   Conjunctivae are normal. EOMI. PERRL. ENT      Head:   Normocephalic and atraumatic.      Nose: Normal.      Mouth/Throat:   Normal, dry mucous membranes      Neck:   No meningismus. Full ROM. Hematological/Lymphatic/Immunilogical:   No cervical lymphadenopathy. Cardiovascular:   RRR. Symmetric bilateral radial and DP pulses.  No murmurs. Cap refill less than 2 seconds. Respiratory:   Normal respiratory effort without tachypnea/retractions. Breath sounds are clear and equal bilaterally. No wheezes/rales/rhonchi. Gastrointestinal:   Soft with epigastric tenderness Non distended. There is no CVA tenderness.  No rebound, rigidity, or guarding.  Musculoskeletal:   Normal range of motion in all extremities. No joint effusions.  No lower extremity tenderness.  No edema. Neurologic:   Normal speech and language.  Motor grossly intact. No acute focal neurologic deficits are appreciated.  Skin:    Skin is warm, dry and intact. No rash noted.  No petechiae, purpura, or bullae.  ____________________________________________    LABS (pertinent positives/negatives) (all labs ordered are listed, but only abnormal results are displayed) Labs Reviewed  COMPREHENSIVE METABOLIC PANEL - Abnormal; Notable for the following components:       Result Value   Glucose, Bld 176 (*)    Total Protein 8.3 (*)    All other components within normal limits  CBC - Abnormal; Notable for the following components:   WBC 11.2 (*)    RBC 5.20 (*)    Hemoglobin 15.3 (*)    All other components within normal limits  URINALYSIS, COMPLETE (UACMP) WITH MICROSCOPIC - Abnormal; Notable for the following components:   Color, Urine YELLOW (*)    APPearance CLEAR (*)    Specific Gravity, Urine 1.035 (*)    Glucose, UA >=500 (*)    Ketones, ur 20 (*)    All other components within normal limits  LIPASE, BLOOD  POC URINE PREG, ED   ____________________________________________   EKG    ____________________________________________    RADIOLOGY  Ct Abdomen Pelvis W Contrast  Result Date: 01/28/2019 CLINICAL DATA:  48 year old female with nausea and vomiting EXAM: CT ABDOMEN AND PELVIS WITH CONTRAST TECHNIQUE: Multidetector CT imaging of the abdomen and pelvis was performed using the standard protocol following bolus administration of intravenous contrast. CONTRAST:  OMNIPAQUE IOHEXOL 300 MG/ML  SOLN COMPARISON:  08/08/2017 FINDINGS: Lower chest: No acute finding of the lower chest. Hepatobiliary: Unremarkable liver.  Cholecystectomy. Pancreas: Unremarkable Spleen: Unremarkable Adrenals/Urinary Tract: - Right adrenal gland:  Unremarkable - Left adrenal gland: Unremarkable. - Right kidney: No hydronephrosis, nephrolithiasis, inflammation, or ureteral dilation. No focal lesion. - Left Kidney: No hydronephrosis, nephrolithiasis, inflammation, or ureteral dilation. No focal lesion. - Urinary Bladder: Unremarkable. Stomach/Bowel: - Stomach: Unremarkable. - Small bowel: Unremarkable - Appendix: Normal. - Colon: Large stool burden. No evidence of obstruction. No focal inflammatory changes. Vascular/Lymphatic: No significant vascular findings are present. No enlarged abdominal or pelvic lymph nodes. Reproductive: Unremarkable uterus/adnexa Other: Small  fat containing umbilical hernia. Musculoskeletal: Nonspecific infiltration of the fat of the low abdominal wall, asymmetric to the patient's left which is unchanged from the prior CT scan. No displaced fracture.  Mild degenerative changes. IMPRESSION: No acute finding of the abdomen. Ancillary findings as above Electronically Signed   By: Gilmer Mor D.O.   On: 01/28/2019 11:48  ____________________________________________   PROCEDURES .Critical Care Performed by: Carrie Mew, MD Authorized by: Carrie Mew, MD   Critical care provider statement:    Critical care time (minutes):  32   Critical care time was exclusive of:  Separately billable procedures and treating other patients   Critical care was necessary to treat or prevent imminent or life-threatening deterioration of the following conditions:  Shock, circulatory failure and respiratory failure   Critical care was time spent personally by me on the following activities:  Development of treatment plan with patient or surrogate, discussions with consultants, evaluation of patient's response to treatment, examination of patient, obtaining history from patient or surrogate, ordering and performing treatments and interventions, ordering and review of laboratory studies, ordering and review of radiographic studies, pulse oximetry, re-evaluation of patient's condition and review of old charts    ____________________________________________  DIFFERENTIAL DIAGNOSIS   Pancreatitis, gastritis, GERD, gastroparesis, bowel obstruction.   CLINICAL IMPRESSION / ASSESSMENT AND PLAN / ED COURSE  Medications ordered in the ED: Medications  sodium chloride flush (NS) 0.9 % injection 3 mL (3 mLs Intravenous Given 01/28/19 1015)  ondansetron (ZOFRAN) injection 4 mg (4 mg Intravenous Given 01/28/19 0839)  sodium chloride 0.9 % bolus 1,000 mL (0 mLs Intravenous Stopped 01/28/19 1224)  HYDROmorphone (DILAUDID) injection 1 mg (1 mg  Intravenous Given 01/28/19 1009)  diphenhydrAMINE (BENADRYL) injection 25 mg (25 mg Intravenous Given 01/28/19 1010)  metoCLOPramide (REGLAN) injection 10 mg (10 mg Intravenous Given 01/28/19 1009)  iohexol (OMNIPAQUE) 300 MG/ML solution 100 mL (100 mLs Intravenous Contrast Given 01/28/19 1115)  diphenhydrAMINE (BENADRYL) injection 25 mg (25 mg Intravenous Given 01/28/19 1145)  EPINEPHrine (EPI-PEN) injection 0.3 mg (0.3 mg Intramuscular Given 01/28/19 1149)  famotidine (PEPCID) IVPB 20 mg premix (0 mg Intravenous Stopped 01/28/19 1224)  methylPREDNISolone sodium succinate (SOLU-MEDROL) 125 mg/2 mL injection 125 mg (125 mg Intravenous Given 01/28/19 1143)    Pertinent labs & imaging results that were available during my care of the patient were reviewed by me and considered in my medical decision making (see chart for details).  Julia Brown was evaluated in Emergency Department on 01/28/2019 for the symptoms described in the history of present illness. She was evaluated in the context of the global COVID-19 pandemic, which necessitated consideration that the patient might be at risk for infection with the SARS-CoV-2 virus that causes COVID-19. Institutional protocols and algorithms that pertain to the evaluation of patients at risk for COVID-19 are in a state of rapid change based on information released by regulatory bodies including the CDC and federal and state organizations. These policies and algorithms were followed during the patient's care in the ED.   Patient presents with abdominal pain and vomiting since this morning.  Already status post cholecystectomy.  No relief with Zofran.  Will give Dilaudid 1 mg IV for pain control, Reglan IV and Benadryl IV for nausea relief.  Will need to obtain a CT scan due to her diabetes and severe pain.  No history of IV contrast allergy.  Clinical Course as of Jan 28 1444  Sat Jan 28, 2019  1135 Patient just returned from CT.  Soon after injection of IV  contrast, she developed shortness of breath and nonproductive cough.  On assessment in the room, room air oxygen saturation is 90%.  She has a mild expiratory wheeze.  No stridor.  She has some skin itching and urticaria as well.  Lips appear mildly edematous although she denies a feeling of tongue swelling or  facial swelling.  This is consistent with anaphylactic reaction to the IV contrast.  I will give her Pepcid, Benadryl, EpiPen.  Solu-Medrol.   [PS]  1215 CT abdomen unremarkable.  If allergy symptoms resolved patient will be stable for discharge home.   [PS]    Clinical Course User Index [PS] Sharman Cheek, MD     ----------------------------------------- 2:44 PM on 01/28/2019 -----------------------------------------  Feeling back to normal, oxygen saturation 99% on room air.  Lungs clear to auscultation bilaterally, no inducible wheezing or cough with FEV1 maneuver.  No itching or skin rash.  She is tolerating oral intake and states that symptomatically she feels much better.  Continue oral Reglan and Benadryl and NSAIDs at home, follow-up with primary care.  Recommended continuing the Benadryl throughout today due to IV contrast allergy, taking Reglan as needed for GI symptoms.  Senokot for constipation.  Patient counseled on her allergy to IV contrast that we discovered today, and this allergy was entered into the EMR.  ____________________________________________   FINAL CLINICAL IMPRESSION(S) / ED DIAGNOSES    Final diagnoses:  Acute gastritis without hemorrhage, unspecified gastritis type  Anaphylactic reaction to contrast media  Constipation   ED Discharge Orders         Ordered    metoCLOPramide (REGLAN) 10 MG tablet  Every 6 hours PRN     01/28/19 1442    diphenhydrAMINE (BENADRYL) 25 mg capsule  Every 6 hours PRN     01/28/19 1442    senna-docusate (SENOKOT-S) 8.6-50 MG tablet  2 times daily     01/28/19 1442          Portions of this note were  generated with dragon dictation software. Dictation errors may occur despite best attempts at proofreading.   Sharman Cheek, MD 01/28/19 1445

## 2019-01-28 NOTE — ED Notes (Signed)
Pt cont to vomit despite zofran administration

## 2019-02-12 ENCOUNTER — Other Ambulatory Visit: Payer: Self-pay | Admitting: Family

## 2019-02-12 DIAGNOSIS — I10 Essential (primary) hypertension: Secondary | ICD-10-CM

## 2019-02-12 DIAGNOSIS — E119 Type 2 diabetes mellitus without complications: Secondary | ICD-10-CM

## 2019-02-13 ENCOUNTER — Other Ambulatory Visit: Payer: Self-pay

## 2019-02-13 ENCOUNTER — Telehealth: Payer: Self-pay

## 2019-02-13 DIAGNOSIS — E119 Type 2 diabetes mellitus without complications: Secondary | ICD-10-CM

## 2019-02-13 MED ORDER — METFORMIN HCL 500 MG PO TABS: ORAL_TABLET | ORAL | 3 refills | Status: DC

## 2019-02-13 NOTE — Telephone Encounter (Signed)
I spoke with patient after receiving call from [facility] at [facility] for clarification on patient's current metformin dose. Patient stated that she was taking the metformin 500mg  two tablets to equal 1000mg  once a day. She stated that she could not titrate up like Joycelyn Schmid had suggested due to stomach upset. I called [facility] to let them know this was resent.

## 2019-02-14 ENCOUNTER — Other Ambulatory Visit: Payer: Self-pay | Admitting: Family

## 2019-02-14 DIAGNOSIS — E119 Type 2 diabetes mellitus without complications: Secondary | ICD-10-CM

## 2019-02-14 MED ORDER — METFORMIN HCL ER 500 MG PO TB24: 1000.0000 mg | ORAL_TABLET | Freq: Every day | ORAL | 0 refills | Status: DC

## 2019-02-14 NOTE — Telephone Encounter (Signed)
The XR is tolerated better.  I sent a 30 day  Supply of the 500 mg tablets with directions ot take 2 daily if tolerated

## 2019-02-14 NOTE — Telephone Encounter (Signed)
Jason with [facility] called back to say that patient medication was switched from XR to IR but need to be put back to the XR asking for a new Rx to be sent over with corrected dose and and correct medication. Please advise

## 2019-02-14 NOTE — Addendum Note (Signed)
Addended by: Crecencio Mc on: 02/14/2019 10:32 AM   Modules accepted: Orders

## 2019-02-28 ENCOUNTER — Other Ambulatory Visit: Payer: Self-pay | Admitting: Family

## 2019-02-28 DIAGNOSIS — E119 Type 2 diabetes mellitus without complications: Secondary | ICD-10-CM

## 2019-03-01 ENCOUNTER — Telehealth: Payer: Self-pay | Admitting: Family

## 2019-03-01 ENCOUNTER — Other Ambulatory Visit: Payer: Self-pay

## 2019-03-01 ENCOUNTER — Other Ambulatory Visit: Payer: Self-pay | Admitting: Internal Medicine

## 2019-03-01 DIAGNOSIS — E1165 Type 2 diabetes mellitus with hyperglycemia: Secondary | ICD-10-CM

## 2019-03-01 DIAGNOSIS — D72829 Elevated white blood cell count, unspecified: Secondary | ICD-10-CM

## 2019-03-01 DIAGNOSIS — E119 Type 2 diabetes mellitus without complications: Secondary | ICD-10-CM

## 2019-03-01 DIAGNOSIS — Z1329 Encounter for screening for other suspected endocrine disorder: Secondary | ICD-10-CM

## 2019-03-01 DIAGNOSIS — Z794 Long term (current) use of insulin: Secondary | ICD-10-CM

## 2019-03-01 MED ORDER — JARDIANCE 10 MG PO TABS: 10.0000 mg | ORAL_TABLET | Freq: Every morning | ORAL | 1 refills | Status: DC

## 2019-03-01 NOTE — Telephone Encounter (Signed)
Not all labs are due now  Shes had some from 10-01/2019 ordered labs we could do now   Call be to schedule fasting labs F/u with Arnett about labs and further labs

## 2019-03-01 NOTE — Telephone Encounter (Signed)
Pt has a virtual appt on 03/08/2019 with Arnett. She would like to have her labs done before her appt so Vidal Schwalbe has the results. No orders are in, could not schedule labs without a order.

## 2019-03-02 ENCOUNTER — Telehealth: Payer: Self-pay | Admitting: Family

## 2019-03-02 NOTE — Telephone Encounter (Signed)
LMTCB to scheduled labs ordered by [place].

## 2019-03-02 NOTE — Telephone Encounter (Signed)
Lm on vm to call office to make a lab appt before virtual appt.

## 2019-03-02 NOTE — Telephone Encounter (Signed)
I tried to call patient but was unable to leave message due to VM box being full. Pt needs to be scheduled for non-fasting lab appointment.

## 2019-03-08 ENCOUNTER — Encounter: Payer: Self-pay | Admitting: Family

## 2019-03-08 ENCOUNTER — Ambulatory Visit (INDEPENDENT_AMBULATORY_CARE_PROVIDER_SITE_OTHER): Payer: BC Managed Care – PPO | Admitting: Family

## 2019-03-08 DIAGNOSIS — R0683 Snoring: Secondary | ICD-10-CM | POA: Insufficient documentation

## 2019-03-08 DIAGNOSIS — E119 Type 2 diabetes mellitus without complications: Secondary | ICD-10-CM

## 2019-03-08 DIAGNOSIS — I10 Essential (primary) hypertension: Secondary | ICD-10-CM

## 2019-03-08 NOTE — Assessment & Plan Note (Addendum)
Largely controlled. No changes to regimen at this time.Patient understands to return in 04/2019 for repeat labs and f/u.

## 2019-03-08 NOTE — Assessment & Plan Note (Signed)
At goal, no changes to regimen. 

## 2019-03-08 NOTE — Patient Instructions (Signed)
Call  

## 2019-03-08 NOTE — Progress Notes (Signed)
Verbal consent for services obtained from patient prior to services given to TELEPHONE visit:   Location of call:  provider at work patient at home  Names of all persons present for services: [name], NP Chief complaint:   Doing well today. No complaints.   DM- compliant with medication. a1c 7.1 12/2018 . No hypoglycemic episodes  HTN- compliant with medications. at home around 110/ 70 No CP, SOB.  Snores and husband is worried about sleep apnea.   History, background, results pertinent:    A/P/next steps:  Problem List Items Addressed This Visit      Cardiovascular and Mediastinum   Essential hypertension (Chronic)    At goal, no changes to regimen.        Endocrine   Diabetes type 2, controlled ([place]) (Chronic)    Largely controlled. No changes to regimen at this time.Patient understands to return in 04/2019 for repeat labs and f/u.         Other   Snores    Referral to pulmonology. Will follow      Relevant Orders   Ambulatory referral to Pulmonology      I spent 15 min  discussing plan of care over the phone.

## 2019-03-09 NOTE — Assessment & Plan Note (Signed)
Referral to pulmonology. Will follow

## 2019-03-21 ENCOUNTER — Other Ambulatory Visit: Payer: Self-pay | Admitting: Internal Medicine

## 2019-03-27 ENCOUNTER — Encounter: Payer: Self-pay | Admitting: Family

## 2019-03-27 ENCOUNTER — Telehealth: Payer: Self-pay | Admitting: Family

## 2019-03-27 NOTE — Telephone Encounter (Signed)
Left message in regards to covid vaccine question from [employer]

## 2019-04-05 ENCOUNTER — Encounter: Payer: Self-pay | Admitting: Family

## 2019-04-16 ENCOUNTER — Other Ambulatory Visit: Payer: Self-pay | Admitting: Internal Medicine

## 2019-04-16 ENCOUNTER — Other Ambulatory Visit: Payer: Self-pay | Admitting: Family

## 2019-05-20 ENCOUNTER — Other Ambulatory Visit: Payer: Self-pay | Admitting: Family

## 2019-05-22 NOTE — Telephone Encounter (Signed)
Refill request for Vitamin D, last seen 03-08-19, last filled 02-23-18.  Please advise. Last Vitamin D lab was 08/2018

## 2019-05-22 NOTE — Telephone Encounter (Signed)
Call pt Advise that I dced the 50,000 vit d megadose as her last vit d normal She may take a women's vitamin with D in it

## 2019-05-25 NOTE — Telephone Encounter (Signed)
LM for patient to call back so that I can let her know to take OTC vitamin D. Mega dose was d/c.

## 2019-06-05 ENCOUNTER — Encounter: Payer: Self-pay | Admitting: Family

## 2019-06-05 ENCOUNTER — Other Ambulatory Visit: Payer: Self-pay

## 2019-06-05 MED ORDER — CARVEDILOL 3.125 MG PO TABS: ORAL_TABLET | ORAL | 6 refills | Status: DC

## 2019-06-25 ENCOUNTER — Other Ambulatory Visit: Payer: Self-pay | Admitting: Family

## 2019-06-27 ENCOUNTER — Encounter: Payer: Self-pay | Admitting: Family

## 2019-07-07 ENCOUNTER — Other Ambulatory Visit (INDEPENDENT_AMBULATORY_CARE_PROVIDER_SITE_OTHER): Payer: BC Managed Care – PPO

## 2019-07-07 ENCOUNTER — Other Ambulatory Visit: Payer: Self-pay | Admitting: Family

## 2019-07-07 ENCOUNTER — Other Ambulatory Visit: Payer: Self-pay

## 2019-07-07 ENCOUNTER — Other Ambulatory Visit: Payer: Self-pay | Admitting: Internal Medicine

## 2019-07-07 ENCOUNTER — Ambulatory Visit
Admission: RE | Admit: 2019-07-07 | Discharge: 2019-07-07 | Disposition: A | Discharge status: Home / Self Care | Payer: BC Managed Care – PPO | Source: Ambulatory Visit | Attending: Family | Admitting: Family

## 2019-07-07 DIAGNOSIS — Z794 Long term (current) use of insulin: Secondary | ICD-10-CM | POA: Diagnosis not present

## 2019-07-07 DIAGNOSIS — E119 Type 2 diabetes mellitus without complications: Secondary | ICD-10-CM

## 2019-07-07 DIAGNOSIS — Z1329 Encounter for screening for other suspected endocrine disorder: Secondary | ICD-10-CM

## 2019-07-07 DIAGNOSIS — N6320 Unspecified lump in the left breast, unspecified quadrant: Secondary | ICD-10-CM

## 2019-07-07 DIAGNOSIS — Z1231 Encounter for screening mammogram for malignant neoplasm of breast: Secondary | ICD-10-CM

## 2019-07-07 DIAGNOSIS — E1165 Type 2 diabetes mellitus with hyperglycemia: Secondary | ICD-10-CM | POA: Diagnosis not present

## 2019-07-07 DIAGNOSIS — D72829 Elevated white blood cell count, unspecified: Secondary | ICD-10-CM

## 2019-07-07 DIAGNOSIS — R928 Other abnormal and inconclusive findings on diagnostic imaging of breast: Secondary | ICD-10-CM

## 2019-07-07 LAB — T4, FREE: Free T4: 0.79 ng/dL (ref 0.60–1.60)

## 2019-07-07 LAB — CBC WITH DIFFERENTIAL/PLATELET
Basophils Absolute: 0.1 10*3/uL (ref 0.0–0.1)
Basophils Relative: 0.9 % (ref 0.0–3.0)
Eosinophils Absolute: 0.2 10*3/uL (ref 0.0–0.7)
Eosinophils Relative: 1.9 % (ref 0.0–5.0)
HCT: 42 % (ref 36.0–46.0)
Hemoglobin: 14 g/dL (ref 12.0–15.0)
Lymphocytes Relative: 24.1 % (ref 12.0–46.0)
Lymphs Abs: 2.5 10*3/uL (ref 0.7–4.0)
MCHC: 33.3 g/dL (ref 30.0–36.0)
MCV: 91.1 fl (ref 78.0–100.0)
Monocytes Absolute: 1 10*3/uL (ref 0.1–1.0)
Monocytes Relative: 9.3 % (ref 3.0–12.0)
Neutro Abs: 6.7 10*3/uL (ref 1.4–7.7)
Neutrophils Relative %: 63.8 % (ref 43.0–77.0)
Platelets: 260 10*3/uL (ref 150.0–400.0)
RBC: 4.61 Mil/uL (ref 3.87–5.11)
RDW: 13.1 % (ref 11.5–15.5)
WBC: 10.5 10*3/uL (ref 4.0–10.5)

## 2019-07-07 LAB — LIPID PANEL
Cholesterol: 194 mg/dL (ref 0–200)
HDL: 77.2 mg/dL (ref >39.00)
LDL Cholesterol: 97 mg/dL (ref 0–99)
NonHDL: 116.36
Total CHOL/HDL Ratio: 3
Triglycerides: 95 mg/dL (ref 0.0–149.0)
VLDL: 19 mg/dL (ref 0.0–40.0)

## 2019-07-07 LAB — TSH: TSH: 1.91 u[IU]/mL (ref 0.35–4.50)

## 2019-07-08 LAB — MICROALBUMIN / CREATININE URINE RATIO
Creatinine, Urine: 133 mg/dL (ref 20–275)
Microalb Creat Ratio: 5 mcg/mg creat (ref <30)
Microalb, Ur: 0.7 mg/dL

## 2019-07-10 ENCOUNTER — Encounter: Payer: Self-pay | Admitting: Family

## 2019-07-10 ENCOUNTER — Other Ambulatory Visit (INDEPENDENT_AMBULATORY_CARE_PROVIDER_SITE_OTHER): Payer: BC Managed Care – PPO

## 2019-07-10 ENCOUNTER — Other Ambulatory Visit: Payer: Self-pay | Admitting: Family

## 2019-07-10 ENCOUNTER — Other Ambulatory Visit: Payer: Self-pay

## 2019-07-10 DIAGNOSIS — E119 Type 2 diabetes mellitus without complications: Secondary | ICD-10-CM | POA: Diagnosis not present

## 2019-07-10 DIAGNOSIS — I10 Essential (primary) hypertension: Secondary | ICD-10-CM

## 2019-07-10 LAB — HEMOGLOBIN A1C: Hgb A1c MFr Bld: 7 % — ABNORMAL HIGH (ref 4.6–6.5)

## 2019-07-10 MED ORDER — ROSUVASTATIN CALCIUM 20 MG PO TABS: 20.0000 mg | ORAL_TABLET | ORAL | 3 refills | Status: DC

## 2019-07-10 NOTE — Progress Notes (Signed)
Subjective:    Patient ID: Julia Brown, female    DOB: 16-Nov-1970, 49 y.o.   MRN: [id number]  CC: [name] is a 49 y.o. female who presents today for follow up.   HPI: Has vaginal itching and thinks has a yeast infection. Last yeast infection 05/2019 which resolved on its own. 1-2 yeast infection in 2020.No vaginal discharge. No fever, dysuria.  HLD- on crestor 20mg  ( this has been increased)  DM- Following Mediterranean diet. 40 units lantus. Metformin 1000 mg qd.  No hypoglycemic episodes.   a1c 7      Following with [facility] at [facility].  HISTORY:  Past Medical History:  Diagnosis Date  . Chicken pox   . Diabetes mellitus   . GERD (gastroesophageal reflux disease)   . Hyperlipidemia   . Hypertension   . UTI (lower urinary tract infection)    Past Surgical History:  Procedure Laterality Date  . ABLATION  2007   excessive bleeding  . CESAREAN SECTION  1994  . CHOLECYSTECTOMY  1993  . ORIF FEMUR FRACTURE Right    [place]  . TUBAL LIGATION    . uterine ablation     Family History  Problem Relation Age of Onset  . Heart disease Father   . Hyperlipidemia Father   . Diabetes Father   . Hyperlipidemia Mother   . Hypertension Mother   . Schizophrenia Mother   . Depression Mother   . Diabetes Mother   . Heart disease Other   . Cancer Maternal Aunt        breast  . Diabetes Maternal Grandmother   . Diabetes Maternal Grandfather     Allergies: Contrast media [iodinated diagnostic agents], Statins, Bactrim [sulfamethoxazole-trimethoprim], Penicillins, and Trulicity [dulaglutide] Current Outpatient Medications on File Prior to Visit  Medication Sig Dispense Refill  . ACCU-CHEK FASTCLIX LANCETS MISC   99  . ACCU-CHEK GUIDE test strip   99  . amLODipine (NORVASC) 5 MG tablet Take 1 tablet (5 mg total) by mouth daily. 30 tablet 5  . Ascorbic Acid (VITAMIN C PO) Take 1 tablet by mouth.    2008 aspirin EC 81 MG tablet Take 1 tablet (81 mg total)  by mouth daily. 90 tablet 3  . carvedilol (COREG) 3.125 MG tablet TAKE 1 TABLET BY MOUTH TWICE DAILY WITH A MEAL 180 tablet 6  . diphenhydrAMINE (BENADRYL) 25 mg capsule Take 2 capsules (50 mg total) by mouth every 6 (six) hours as needed. 60 capsule 0  . empagliflozin (JARDIANCE) 10 MG TABS tablet Take 10 mg by mouth every morning. 90 tablet 1  . Insulin Glargine (BASAGLAR KWIKPEN) 100 UNIT/ML SOPN Inject 40 units under the skin every morning. 60 mL 2  . Insulin Pen Needle (UNIFINE PENTIPS) 31G X 5 MM MISC USE AS DIRECTED 200 each 6  . metFORMIN (GLUCOPHAGE-XR) 500 MG 24 hr tablet Take 2 tablets (1,000 mg total) by mouth daily with breakfast. 180 tablet 1  . metoCLOPramide (REGLAN) 10 MG tablet Take 1 tablet (10 mg total) by mouth every 6 (six) hours as needed. 30 tablet 0  . omeprazole (PRILOSEC) 40 MG capsule Take 1 capsule (40 mg total) by mouth daily. 90 capsule 2  . quinapril (ACCUPRIL) 40 MG tablet Take 1 tablet (40 mg total) by mouth daily. 90 tablet 1  . rosuvastatin (CRESTOR) 20 MG tablet Take 1 tablet (20 mg total) by mouth every other day. 45 tablet 3  . senna-docusate (SENOKOT-S) 8.6-50 MG  tablet Take 2 tablets by mouth 2 (two) times daily. 120 tablet 0   No current facility-administered medications on file prior to visit.    Social History   Tobacco Use  . Smoking status: Current Every Day Smoker    Packs/day: 0.50    Years: 10.00    Pack years: 5.00    Types: Cigarettes  . Smokeless tobacco: Never Used  . Tobacco comment: smoked < 1 pack a day  Substance Use Topics  . Alcohol use: Yes    Alcohol/week: 0.0 standard drinks    Comment: weekend 3-4drinks  . Drug use: No    Review of Systems  Constitutional: Negative for chills and fever.  Respiratory: Negative for cough.   Cardiovascular: Negative for chest pain and palpitations.  Gastrointestinal: Negative for nausea and vomiting.  Genitourinary: Negative for dysuria, menstrual problem, urgency, vaginal discharge and  vaginal pain.      Objective:    BP 130/80   Pulse 95   Temp (!) 96.8 F (36 C) (Temporal)   Ht 5\' 10"  (1.778 m)   Wt 236 lb 12.8 oz (107.4 kg)   SpO2 99%   BMI 33.98 kg/m  BP Readings from Last 3 Encounters:  07/19/19 130/80  01/28/19 128/79  01/14/19 (!) 122/91   Wt Readings from Last 3 Encounters:  07/19/19 236 lb 12.8 oz (107.4 kg)  03/08/19 239 lb (108.4 kg)  01/28/19 239 lb (108.4 kg)    Physical Exam Vitals reviewed.  Constitutional:      Appearance: She is well-developed.  Eyes:     Conjunctiva/sclera: Conjunctivae normal.  Cardiovascular:     Rate and Rhythm: Normal rate and regular rhythm.     Pulses: Normal pulses.     Heart sounds: Normal heart sounds.  Pulmonary:     Effort: Pulmonary effort is normal.     Breath sounds: Normal breath sounds. No wheezing, rhonchi or rales.  Skin:    General: Skin is warm and dry.  Neurological:     Mental Status: She is alert.  Psychiatric:        Speech: Speech normal.        Behavior: Behavior normal.        Thought Content: Thought content normal.        Assessment & Plan:   Problem List Items Addressed This Visit      Cardiovascular and Mediastinum   Essential hypertension - Primary (Chronic)    Stable, continue current regimen      Relevant Orders   Comprehensive metabolic panel   VITAMIN D 25 Hydroxy (Vit-D Deficiency, Fractures)     Endocrine   Diabetes type 2, controlled ([facility]) (Chronic)    Not quite at goal although patient has made significant dietary changes.  She politely declines discontinuing Jardiance in the setting of suspected yeast infection.  We have had a long discussion regards to this and  decided if she has 1 more yeast infection, that we will stop the Jardiance and increase Metformin.        Genitourinary   Vulvovaginal candidiasis   Relevant Medications   fluconazole (DIFLUCAN) 150 MG tablet     Other   Hyperlipidemia (Chronic)    On increased dose of Crestor.  Pending  CMP in 6 weeks          I have discontinued Mialee T. Setters's Vitamin D (Ergocalciferol) and oxyCODONE-acetaminophen. I am also having her start on fluconazole. Additionally, I am having her maintain her aspirin EC, Accu-Chek  Guide, Accu-Chek FastClix Lancets, Insulin Pen Needle, Ascorbic Acid (VITAMIN C PO), Basaglar KwikPen, omeprazole, metoCLOPramide, diphenhydrAMINE, senna-docusate, quinapril, Jardiance, metFORMIN, carvedilol, amLODipine, and rosuvastatin.   Meds ordered this encounter  Medications  . fluconazole (DIFLUCAN) 150 MG tablet    Sig: Take 1 tablet (150 mg total) by mouth once for 1 dose. Take one tablet PO once. If sxs persist, may take one tablet PO 3 days later.    Dispense:  2 tablet    Refill:  1    Order Specific Question:   Supervising Provider    Answer:   Crecencio Mc [[id number]]    Return precautions given.   Risks, benefits, and alternatives of the medications and treatment plan prescribed today were discussed, and patient expressed understanding.   Education regarding symptom management and diagnosis given to patient on AVS.  Continue to follow with Burnard Hawthorne, FNP for routine health maintenance.   Schuyler Amor and I agreed with plan.   [name], FNP

## 2019-07-12 ENCOUNTER — Encounter: Payer: Self-pay | Admitting: Family

## 2019-07-19 ENCOUNTER — Ambulatory Visit (INDEPENDENT_AMBULATORY_CARE_PROVIDER_SITE_OTHER): Payer: BC Managed Care – PPO | Admitting: Family

## 2019-07-19 ENCOUNTER — Encounter: Payer: Self-pay | Admitting: Family

## 2019-07-19 ENCOUNTER — Other Ambulatory Visit: Payer: Self-pay

## 2019-07-19 VITALS — BP 130/80 | HR 95 | Temp 96.8°F | Ht 70.0 in | Wt 236.8 lb

## 2019-07-19 DIAGNOSIS — E119 Type 2 diabetes mellitus without complications: Secondary | ICD-10-CM | POA: Diagnosis not present

## 2019-07-19 DIAGNOSIS — I10 Essential (primary) hypertension: Secondary | ICD-10-CM | POA: Diagnosis not present

## 2019-07-19 DIAGNOSIS — E785 Hyperlipidemia, unspecified: Secondary | ICD-10-CM | POA: Diagnosis not present

## 2019-07-19 DIAGNOSIS — B3731 Acute candidiasis of vulva and vagina: Secondary | ICD-10-CM

## 2019-07-19 DIAGNOSIS — B373 Candidiasis of vulva and vagina: Secondary | ICD-10-CM

## 2019-07-19 MED ORDER — FLUCONAZOLE 150 MG PO TABS: 150.0000 mg | ORAL_TABLET | Freq: Once | ORAL | 1 refills | Status: AC

## 2019-07-19 NOTE — Patient Instructions (Signed)
We can try the jardiance one more time, however if yeast infection recurs, I would strongly advise that we stop Jardiance and increase Metformin.  Please let me know how you are doing. Labs in 6 weeks.

## 2019-07-19 NOTE — Assessment & Plan Note (Signed)
On increased dose of Crestor.  Pending CMP in 6 weeks

## 2019-07-19 NOTE — Assessment & Plan Note (Signed)
Not quite at goal although patient has made significant dietary changes.  She politely declines discontinuing Jardiance in the setting of suspected yeast infection.  We have had a long discussion regards to this and  decided if she has 1 more yeast infection, that we will stop the Jardiance and increase Metformin.

## 2019-07-19 NOTE — Assessment & Plan Note (Signed)
Stable, continue current regimen 

## 2019-07-21 ENCOUNTER — Ambulatory Visit
Admission: RE | Admit: 2019-07-21 | Discharge: 2019-07-21 | Disposition: A | Discharge status: Home / Self Care | Payer: BC Managed Care – PPO | Source: Ambulatory Visit | Attending: Family | Admitting: Family

## 2019-07-21 ENCOUNTER — Telehealth: Payer: Self-pay | Admitting: Family

## 2019-07-21 DIAGNOSIS — N6320 Unspecified lump in the left breast, unspecified quadrant: Secondary | ICD-10-CM | POA: Diagnosis present

## 2019-07-21 DIAGNOSIS — R928 Other abnormal and inconclusive findings on diagnostic imaging of breast: Secondary | ICD-10-CM | POA: Diagnosis present

## 2019-07-21 NOTE — Telephone Encounter (Signed)
lvm to schedule 3 month follow up °

## 2019-08-22 ENCOUNTER — Other Ambulatory Visit: Payer: Self-pay | Admitting: Family

## 2019-08-22 DIAGNOSIS — E119 Type 2 diabetes mellitus without complications: Secondary | ICD-10-CM

## 2019-08-22 DIAGNOSIS — I10 Essential (primary) hypertension: Secondary | ICD-10-CM

## 2019-08-23 ENCOUNTER — Other Ambulatory Visit: Payer: Self-pay

## 2019-08-23 ENCOUNTER — Other Ambulatory Visit (INDEPENDENT_AMBULATORY_CARE_PROVIDER_SITE_OTHER): Payer: BC Managed Care – PPO

## 2019-08-23 DIAGNOSIS — I10 Essential (primary) hypertension: Secondary | ICD-10-CM | POA: Diagnosis not present

## 2019-08-24 LAB — VITAMIN D 25 HYDROXY (VIT D DEFICIENCY, FRACTURES): VITD: 39.28 ng/mL (ref 30.00–100.00)

## 2019-08-24 LAB — COMPREHENSIVE METABOLIC PANEL
ALT: 22 U/L (ref 0–35)
AST: 19 U/L (ref 0–37)
Albumin: 4.5 g/dL (ref 3.5–5.2)
Alkaline Phosphatase: 62 U/L (ref 39–117)
BUN: 17 mg/dL (ref 6–23)
CO2: 27 mEq/L (ref 19–32)
Calcium: 9.7 mg/dL (ref 8.4–10.5)
Chloride: 99 mEq/L (ref 96–112)
Creatinine, Ser: 0.86 mg/dL (ref 0.40–1.20)
GFR: 85.13 mL/min (ref >60.00)
Glucose, Bld: 169 mg/dL — ABNORMAL HIGH (ref 70–99)
Potassium: 4.3 mEq/L (ref 3.5–5.1)
Sodium: 135 mEq/L (ref 135–145)
Total Bilirubin: 0.4 mg/dL (ref 0.2–1.2)
Total Protein: 7.2 g/dL (ref 6.0–8.3)

## 2019-09-03 ENCOUNTER — Other Ambulatory Visit: Payer: Self-pay | Admitting: Family

## 2019-09-12 ENCOUNTER — Other Ambulatory Visit: Payer: Self-pay | Admitting: Family

## 2019-10-04 ENCOUNTER — Other Ambulatory Visit: Payer: Self-pay | Admitting: Internal Medicine

## 2019-10-05 ENCOUNTER — Other Ambulatory Visit: Payer: Self-pay | Admitting: Internal Medicine

## 2019-10-06 ENCOUNTER — Other Ambulatory Visit: Payer: Self-pay | Admitting: Internal Medicine

## 2019-12-24 ENCOUNTER — Other Ambulatory Visit: Payer: Self-pay | Admitting: Family

## 2019-12-25 ENCOUNTER — Other Ambulatory Visit: Payer: Self-pay

## 2019-12-25 ENCOUNTER — Other Ambulatory Visit: Payer: Self-pay | Admitting: Family

## 2019-12-25 MED ORDER — AMLODIPINE BESYLATE 5 MG PO TABS: 5.0000 mg | ORAL_TABLET | Freq: Every day | ORAL | 5 refills | Status: DC

## 2020-01-10 ENCOUNTER — Ambulatory Visit (INDEPENDENT_AMBULATORY_CARE_PROVIDER_SITE_OTHER): Payer: BC Managed Care – PPO | Admitting: Family

## 2020-01-10 ENCOUNTER — Encounter: Payer: Self-pay | Admitting: Family

## 2020-01-10 ENCOUNTER — Other Ambulatory Visit: Payer: Self-pay

## 2020-01-10 VITALS — BP 142/90 | HR 90 | Temp 98.5°F | Ht 70.0 in | Wt 235.4 lb

## 2020-01-10 DIAGNOSIS — I10 Essential (primary) hypertension: Secondary | ICD-10-CM | POA: Diagnosis not present

## 2020-01-10 DIAGNOSIS — Z8616 Personal history of COVID-19: Secondary | ICD-10-CM

## 2020-01-10 DIAGNOSIS — E119 Type 2 diabetes mellitus without complications: Secondary | ICD-10-CM

## 2020-01-10 DIAGNOSIS — E785 Hyperlipidemia, unspecified: Secondary | ICD-10-CM

## 2020-01-10 DIAGNOSIS — E559 Vitamin D deficiency, unspecified: Secondary | ICD-10-CM | POA: Diagnosis not present

## 2020-01-10 LAB — COMPREHENSIVE METABOLIC PANEL
ALT: 20 U/L (ref 0–35)
AST: 19 U/L (ref 0–37)
Albumin: 4.4 g/dL (ref 3.5–5.2)
Alkaline Phosphatase: 58 U/L (ref 39–117)
BUN: 13 mg/dL (ref 6–23)
CO2: 29 mEq/L (ref 19–32)
Calcium: 9.7 mg/dL (ref 8.4–10.5)
Chloride: 101 mEq/L (ref 96–112)
Creatinine, Ser: 0.66 mg/dL (ref 0.40–1.20)
GFR: 104.01 mL/min (ref >60.00)
Glucose, Bld: 88 mg/dL (ref 70–99)
Potassium: 4.5 mEq/L (ref 3.5–5.1)
Sodium: 138 mEq/L (ref 135–145)
Total Bilirubin: 0.6 mg/dL (ref 0.2–1.2)
Total Protein: 6.8 g/dL (ref 6.0–8.3)

## 2020-01-10 LAB — LIPID PANEL
Cholesterol: 199 mg/dL (ref 0–200)
HDL: 74.8 mg/dL (ref >39.00)
LDL Cholesterol: 102 mg/dL — ABNORMAL HIGH (ref 0–99)
NonHDL: 123.7
Total CHOL/HDL Ratio: 3
Triglycerides: 109 mg/dL (ref 0.0–149.0)
VLDL: 21.8 mg/dL (ref 0.0–40.0)

## 2020-01-10 LAB — CBC WITH DIFFERENTIAL/PLATELET
Basophils Absolute: 0.1 10*3/uL (ref 0.0–0.1)
Basophils Relative: 0.8 % (ref 0.0–3.0)
Eosinophils Absolute: 0.1 10*3/uL (ref 0.0–0.7)
Eosinophils Relative: 1.5 % (ref 0.0–5.0)
HCT: 40.7 % (ref 36.0–46.0)
Hemoglobin: 13.9 g/dL (ref 12.0–15.0)
Lymphocytes Relative: 27.5 % (ref 12.0–46.0)
Lymphs Abs: 2.2 10*3/uL (ref 0.7–4.0)
MCHC: 34.1 g/dL (ref 30.0–36.0)
MCV: 87.8 fl (ref 78.0–100.0)
Monocytes Absolute: 0.5 10*3/uL (ref 0.1–1.0)
Monocytes Relative: 6.4 % (ref 3.0–12.0)
Neutro Abs: 5.2 10*3/uL (ref 1.4–7.7)
Neutrophils Relative %: 63.8 % (ref 43.0–77.0)
Platelets: 288 10*3/uL (ref 150.0–400.0)
RBC: 4.64 Mil/uL (ref 3.87–5.11)
RDW: 13 % (ref 11.5–15.5)
WBC: 8.1 10*3/uL (ref 4.0–10.5)

## 2020-01-10 LAB — HEMOGLOBIN A1C: Hgb A1c MFr Bld: 7.4 % — ABNORMAL HIGH (ref 4.6–6.5)

## 2020-01-10 LAB — VITAMIN D 25 HYDROXY (VIT D DEFICIENCY, FRACTURES): VITD: 45.5 ng/mL (ref 30.00–100.00)

## 2020-01-10 NOTE — Assessment & Plan Note (Addendum)
Presume controlled. Pending a1c. Continue jardiance 10mg  , metformin 1000mg  bid and lantus 40 units daily.  Diabetic foot exam raised concern for PVD right foot. Pending vascular referral.

## 2020-01-10 NOTE — Assessment & Plan Note (Signed)
Elevated. She hasnt been taking coreg 3.125mg  BID, she will start this. She will continue norvasc 5mg  qd, quinapril 40mg .

## 2020-01-10 NOTE — Patient Instructions (Addendum)
Referral to vascular  Let us know if you dont hear back within a week in regards to an appointment being scheduled.   Start taking coreg TWICE PER DAY as discussed.   It is imperative that you are seen AT least twice per year for labs and monitoring. Monitor blood pressure at home and me 5-6 reading on separate days. Goal is less than 120/80, based on newest guidelines, however we certainly want to be less than 130/80;  if persistently higher, please make sooner follow up appointment so we can recheck you blood pressure and manage/ adjust medications.   Stop 2000 unit of vitamin D , resume 1000 units once per day.   This is  Dr. Melina Schools  example of a  "Low GI"  Diet:  It will allow you to lose 4 to 8  lbs  per month if you follow it carefully.  Your goal with exercise is a minimum of 30 minutes of aerobic exercise 5 days per week (Walking does not count once it becomes easy!)    All of the foods can be found at grocery stores and in bulk at [employer].  The Atkins protein bars and shakes are available in more varieties at Target, WalMart and [employer]'s Foods.     7 AM Breakfast:  Choose from the following:  Low carbohydrate Protein  Shakes (I recommend the  Premier Protein chocolate shakes,  EAS AdvantEdge "Carb Control" shakes  Or the Atkins shakes all are under 3 net carbs)     a scrambled egg/bacon/cheese burrito made with Mission's "carb balance" whole wheat tortilla  (about 10 net carbs )  Medical laboratory scientific officer (basically a quiche without the pastry crust) that is eaten cold and very convenient way to get your eggs.  8 carbs)  If you make your own protein shakes, avoid bananas and pineapple,  And use low carb greek yogurt or original /unsweetened almond or soy milk    Avoid cereal and bananas, oatmeal and cream of wheat and grits. They are loaded with carbohydrates!   10 AM: high protein snack:  Protein bar by Atkins (the snack size, under 200 cal, usually < 6 net carbs).     A stick of cheese:  Around 1 carb,  100 cal     Dannon Light n Fit [place] Yogurt  (80 cal, 8 carbs)  Other so called "protein bars" and Greek yogurts tend to be loaded with carbohydrates.  Remember, in food advertising, the word "energy" is synonymous for " carbohydrate."  Lunch:   A Sandwich using the bread choices listed, Can use any  Eggs,  lunchmeat, grilled meat or canned tuna), avocado, regular mayo/mustard  and cheese.  A Salad using blue cheese, ranch,  Goddess or vinagrette,  Avoid taco shells, croutons or "confetti" and no "candied nuts" but regular nuts OK.   No pretzels, nabs  or chips.  Pickles and miniature sweet peppers are a good low carb alternative that provide a "crunch"  The bread is the only source of carbohydrate in a sandwich and  can be decreased by trying some of the attached alternatives to traditional loaf bread   Avoid "Low fat dressings, as well as Reyne Dumas and [employer] dressings They are loaded with sugar!   3 PM/ Mid day  Snack:  Consider  1 ounce of  almonds, walnuts, pistachios, pecans, peanuts,  Macadamia nuts or a nut medley.  Avoid "granola and granola bars "  Mixed nuts are ok  in moderation as long as there are no raisins,  cranberries or dried fruit.   KIND bars are OK if you get the low glycemic index variety   Try the prosciutto/mozzarella cheese sticks by Fiorruci  In deli /backery section   High protein      6 PM  Dinner:     Meat/fowl/fish with a green salad, and either broccoli, cauliflower, green beans, spinach, brussel sprouts or  Lima beans. DO NOT BREAD THE PROTEIN!!      There is a low carb pasta by Dreamfield's that is acceptable and tastes great: only 5 digestible carbs/serving.( All grocery stores but BJs carry it ) Several ready made meals are available low carb:   Try Michel Angelo's chicken piccata or chicken or eggplant parm over low carb pasta.(Lowes and BJs)   Clifton Custard Sanchez's "Carnitas" (pulled pork, no sauce,  0 carbs) or  his beef pot roast to make a dinner burrito (at BJ's)  Pesto over low carb pasta (bj's sells a good quality pesto in the center refrigerated section of the deli   Try satueeing  Roosvelt Harps with mushroooms as a good side   Green Giant makes a mashed cauliflower that tastes like mashed potatoes  Whole wheat pasta is still full of digestible carbs and  Not as low in glycemic index as Dreamfield's.   Brown rice is still rice,  So skip the rice and noodles if you eat [place] or [place] (or at least limit to 1/2 cup)  9 PM snack :   Breyer's "low carb" fudgsicle or  ice cream bar (Carb Smart line), or  Weight Watcher's ice cream bar , or another "no sugar added" ice cream;  a serving of fresh berries/cherries with whipped cream   Cheese or DANNON'S LlGHT N FIT GREEK YOGURT  8 ounces of [place] unsweetened almond/cococunut milk    Treat yourself to a parfait made with whipped cream blueberiies, walnuts and vanilla greek yogurt  Avoid bananas, pineapple, grapes  and watermelon on a regular basis because they are high in sugar.  THINK OF THEM AS DESSERT  Remember that snack Substitutions should be less than 10 NET carbs per serving and meals < 20 carbs. Remember to subtract fiber grams to get the "net carbs."      Carbohydrate Counting for Diabetes Mellitus, Adult  Carbohydrate counting is a method of keeping track of how many carbohydrates you eat. Eating carbohydrates naturally increases the amount of sugar (glucose) in the blood. Counting how many carbohydrates you eat helps keep your blood glucose within normal limits, which helps you manage your diabetes (diabetes mellitus). It is important to know how many carbohydrates you can safely have in each meal. This is different for every person. A diet and nutrition specialist (registered dietitian) can help you make a meal plan and calculate how many carbohydrates you should have at each meal and snack. Carbohydrates are found in the following  foods:  Grains, such as breads and cereals.  Dried beans and soy products.  Starchy vegetables, such as potatoes, peas, and corn.  Fruit and fruit juices.  Milk and yogurt.  Sweets and snack foods, such as cake, cookies, candy, chips, and soft drinks. How do I count carbohydrates? There are two ways to count carbohydrates in food. You can use either of the methods or a combination of both. Reading "Nutrition Facts" on packaged food The "Nutrition Facts" list is included on the labels of almost all packaged foods and beverages in  the [place] It includes:  The serving size.  Information about nutrients in each serving, including the grams (g) of carbohydrate per serving. To use the "Nutrition Facts":  Decide how many servings you will have.  Multiply the number of servings by the number of carbohydrates per serving.  The resulting number is the total amount of carbohydrates that you will be having. Learning standard serving sizes of other foods When you eat carbohydrate foods that are not packaged or do not include "Nutrition Facts" on the label, you need to measure the servings in order to count the amount of carbohydrates:  Measure the foods that you will eat with a food scale or measuring cup, if needed.  Decide how many standard-size servings you will eat.  Multiply the number of servings by 15. Most carbohydrate-rich foods have about 15 g of carbohydrates per serving. ? For example, if you eat 8 oz (170 g) of strawberries, you will have eaten 2 servings and 30 g of carbohydrates (2 servings x 15 g = 30 g).  For foods that have more than one food mixed, such as soups and casseroles, you must count the carbohydrates in each food that is included. The following list contains standard serving sizes of common carbohydrate-rich foods. Each of these servings has about 15 g of carbohydrates:   hamburger bun or  English muffin.   oz (15 mL) syrup.   oz (14 g) jelly.  1 slice  of bread.  1 six-inch tortilla.  3 oz (85 g) cooked rice or pasta.  4 oz (113 g) cooked dried beans.  4 oz (113 g) starchy vegetable, such as peas, corn, or potatoes.  4 oz (113 g) hot cereal.  4 oz (113 g) mashed potatoes or  of a large baked potato.  4 oz (113 g) canned or frozen fruit.  4 oz (120 mL) fruit juice.  4-6 crackers.  6 chicken nuggets.  6 oz (170 g) unsweetened dry cereal.  6 oz (170 g) plain fat-free yogurt or yogurt sweetened with artificial sweeteners.  8 oz (240 mL) milk.  8 oz (170 g) fresh fruit or one small piece of fruit.  24 oz (680 g) popped popcorn. Example of carbohydrate counting Sample meal  3 oz (85 g) chicken breast.  6 oz (170 g) brown rice.  4 oz (113 g) corn.  8 oz (240 mL) milk.  8 oz (170 g) strawberries with sugar-free whipped topping. Carbohydrate calculation 1. Identify the foods that contain carbohydrates: ? Rice. ? Corn. ? Milk. ? Strawberries. 2. Calculate how many servings you have of each food: ? 2 servings rice. ? 1 serving corn. ? 1 serving milk. ? 1 serving strawberries. 3. Multiply each number of servings by 15 g: ? 2 servings rice x 15 g = 30 g. ? 1 serving corn x 15 g = 15 g. ? 1 serving milk x 15 g = 15 g. ? 1 serving strawberries x 15 g = 15 g. 4. Add together all of the amounts to find the total grams of carbohydrates eaten: ? 30 g + 15 g + 15 g + 15 g = 75 g of carbohydrates total. Summary  Carbohydrate counting is a method of keeping track of how many carbohydrates you eat.  Eating carbohydrates naturally increases the amount of sugar (glucose) in the blood.  Counting how many carbohydrates you eat helps keep your blood glucose within normal limits, which helps you manage your diabetes.  A diet and nutrition  specialist (registered dietitian) can help you make a meal plan and calculate how many carbohydrates you should have at each meal and snack. This information is not intended to replace  advice given to you by your health care provider. Make sure you discuss any questions you have with your health care provider. Document Revised: 10/01/2016 Document Reviewed: 08/21/2015 Elsevier Patient Education  2020 [employer]

## 2020-01-10 NOTE — Progress Notes (Signed)
Subjective:    Patient ID: Julia Brown, female    DOB: 1970/07/21, 49 y.o.   MRN: [id number]  CC: Julia Brown is a 49 y.o. female who presents today for follow up.   HPI: Feels well today No complaints Taking vitamin d 2000 units since covid diagnosis 2 months ago.   DM- compliant with jardiance, metformin, 40 units lantus. FBG 120. No hypoglycemic episodes. Has seen nutritionist in the past.   No further yeast infections. Feet are always cold. No wounds. No pain in calves with ambulation.    HTN- at work, 117/70. Took amlodipine , coreg qd, and quinapril ; took medications an hour ago.No cp, sob.       HISTORY:  Past Medical History:  Diagnosis Date  . Chicken pox   . Diabetes mellitus   . GERD (gastroesophageal reflux disease)   . Hyperlipidemia   . Hypertension   . UTI (lower urinary tract infection)    Past Surgical History:  Procedure Laterality Date  . ABLATION  2007   excessive bleeding  . CESAREAN SECTION  1994  . CHOLECYSTECTOMY  1993  . ORIF FEMUR FRACTURE Right    [place]  . TUBAL LIGATION    . uterine ablation     Family History  Problem Relation Age of Onset  . Heart disease Father   . Hyperlipidemia Father   . Diabetes Father   . Hyperlipidemia Mother   . Hypertension Mother   . Schizophrenia Mother   . Depression Mother   . Diabetes Mother   . Heart disease Other   . Cancer Maternal Aunt        breast  . Diabetes Maternal Grandmother   . Diabetes Maternal Grandfather     Allergies: Contrast media [iodinated diagnostic agents], Statins, Bactrim [sulfamethoxazole-trimethoprim], Penicillins, and Trulicity [dulaglutide] Current Outpatient Medications on File Prior to Visit  Medication Sig Dispense Refill  . ACCU-CHEK FASTCLIX LANCETS MISC   99  . ACCU-CHEK GUIDE test strip   99  . amLODipine (NORVASC) 5 MG tablet Take 1 tablet (5 mg total) by mouth daily. 30 tablet 5  . Ascorbic Acid (VITAMIN C PO) Take 1 tablet by mouth.    Marland Kitchen  aspirin EC 81 MG tablet Take 1 tablet (81 mg total) by mouth daily. 90 tablet 3  . azelastine (ASTELIN) 0.1 % nasal spray Place 1 spray into the nose in the morning and at bedtime.    . carvedilol (COREG) 3.125 MG tablet TAKE 1 TABLET BY MOUTH TWICE DAILY WITH A MEAL 180 tablet 6  . cholecalciferol (VITAMIN D3) 25 MCG (1000 UNIT) tablet Take 1,000 Units by mouth daily.    . Insulin Glargine (BASAGLAR KWIKPEN) 100 UNIT/ML Inject 40 units under the skin every morning. 60 mL 1  . Insulin Pen Needle (BD PEN NEEDLE NANO U/F) 32G X 4 MM MISC See admin instructions.    Marland Kitchen JARDIANCE 10 MG TABS tablet Take 1 tablet by mouth every morning. 90 tablet 1  . metFORMIN (GLUCOPHAGE-XR) 500 MG 24 hr tablet Take 2 tablets (1,000 mg total) by mouth daily with breakfast. 180 tablet 1  . omeprazole (PRILOSEC) 40 MG capsule Take 1 capsule (40 mg total) by mouth daily. 90 capsule 2  . quinapril (ACCUPRIL) 40 MG tablet Take 1 tablet (40 mg total) by mouth daily. 90 tablet 1  . rosuvastatin (CRESTOR) 20 MG tablet Take 1 tablet (20 mg total) by mouth every other day. 45 tablet 3  . senna-docusate (  SENOKOT-S) 8.6-50 MG tablet Take 2 tablets by mouth 2 (two) times daily. 120 tablet 0   No current facility-administered medications on file prior to visit.    Social History   Tobacco Use  . Smoking status: Current Every Day Smoker    Packs/day: 0.50    Years: 10.00    Pack years: 5.00    Types: Cigarettes  . Smokeless tobacco: Never Used  . Tobacco comment: smoked < 1 pack a day  Vaping Use  . Vaping Use: Never used  Substance Use Topics  . Alcohol use: Yes    Alcohol/week: 0.0 standard drinks    Comment: weekend 3-4drinks  . Drug use: No    Review of Systems  Constitutional: Negative for chills and fever.  Respiratory: Negative for cough.   Cardiovascular: Negative for chest pain, palpitations and leg swelling.  Gastrointestinal: Negative for nausea and vomiting.  Skin: Negative for color change and wound.    Neurological: Negative for numbness.      Objective:    BP (!) 142/90   Pulse 90   Temp 98.5 F (36.9 C)   Ht 5\' 10"  (1.778 m)   Wt 235 lb 6.4 oz (106.8 kg)   SpO2 99%   BMI 33.78 kg/m  BP Readings from Last 3 Encounters:  01/10/20 (!) 142/90  07/19/19 130/80  01/28/19 128/79   Wt Readings from Last 3 Encounters:  01/10/20 235 lb 6.4 oz (106.8 kg)  07/19/19 236 lb 12.8 oz (107.4 kg)  03/08/19 239 lb (108.4 kg)    Physical Exam Vitals reviewed.  Constitutional:      Appearance: She is well-developed.  Eyes:     Conjunctiva/sclera: Conjunctivae normal.  Cardiovascular:     Rate and Rhythm: Normal rate and regular rhythm.     Pulses: Normal pulses.     Heart sounds: Normal heart sounds.  Pulmonary:     Effort: Pulmonary effort is normal.     Breath sounds: Normal breath sounds. No wheezing, rhonchi or rales.  Musculoskeletal:     Right lower leg: No edema.     Left lower leg: No edema.  Skin:    General: Skin is warm and dry.  Neurological:     Mental Status: She is alert.  Psychiatric:        Speech: Speech normal.        Behavior: Behavior normal.        Thought Content: Thought content normal.        Assessment & Plan:   Problem List Items Addressed This Visit      Cardiovascular and Mediastinum   Essential hypertension - Primary (Chronic)    Elevated. She hasnt been taking coreg 3.125mg  BID, she will start this. She will continue norvasc 5mg  qd, quinapril 40mg .       Relevant Orders   Comprehensive metabolic panel   CBC with Differential/Platelet     Endocrine   Diabetes type 2, controlled (HCC) (Chronic)    Presume controlled. Pending a1c. Continue jardiance 10mg  , metformin 1000mg  bid and lantus 40 units daily.  Diabetic foot exam raised concern for PVD right foot. Pending vascular referral.      Relevant Orders   Lipid panel   Hemoglobin A1c   Ambulatory referral to Vascular Surgery     Other   Hyperlipidemia (Chronic)    Presume at  goal, Pending lipid panel. Continue crestor 20mg       Relevant Orders   Lipid panel   Vitamin D deficiency  Relevant Orders   VITAMIN D 25 Hydroxy (Vit-D Deficiency, Fractures)    Other Visit Diagnoses    History of 2019 novel coronavirus disease (COVID-19)       Relevant Orders   SARS-CoV-2 Semi-Quantitative Total Antibody, Spike       I have discontinued Breah T. Oldenburg's metoCLOPramide, diphenhydrAMINE, and Insulin Pen Needle. I am also having her maintain her aspirin EC, Accu-Chek Guide, Accu-Chek FastClix Lancets, Ascorbic Acid (VITAMIN C PO), senna-docusate, carvedilol, rosuvastatin, quinapril, Jardiance, omeprazole, Basaglar KwikPen, metFORMIN, amLODipine, azelastine, BD Pen Needle Nano U/F, and cholecalciferol.   No orders of the defined types were placed in this encounter.   Return precautions given.   Risks, benefits, and alternatives of the medications and treatment plan prescribed today were discussed, and patient expressed understanding.   Education regarding symptom management and diagnosis given to patient on AVS.  Continue to follow with Allegra Grana, FNP for routine health maintenance.   [name] and I agreed with plan.   Rennie Plowman, FNP

## 2020-01-10 NOTE — Assessment & Plan Note (Signed)
Presume at goal, Pending lipid panel. Continue crestor 20mg 

## 2020-01-14 LAB — SARS-COV-2 SEMI-QUANTITATIVE TOTAL ANTIBODY, SPIKE: SARS COV2 AB, Total Spike Semi QN: >2500 U/mL — ABNORMAL HIGH (ref <0.8)

## 2020-01-15 ENCOUNTER — Other Ambulatory Visit: Payer: Self-pay | Admitting: Family

## 2020-01-15 DIAGNOSIS — E119 Type 2 diabetes mellitus without complications: Secondary | ICD-10-CM

## 2020-01-15 MED ORDER — EMPAGLIFLOZIN 25 MG PO TABS: 25.0000 mg | ORAL_TABLET | Freq: Every day | ORAL | 1 refills | Status: DC

## 2020-02-09 ENCOUNTER — Other Ambulatory Visit (INDEPENDENT_AMBULATORY_CARE_PROVIDER_SITE_OTHER): Payer: Self-pay | Admitting: Vascular Surgery

## 2020-02-09 DIAGNOSIS — R0989 Other specified symptoms and signs involving the circulatory and respiratory systems: Secondary | ICD-10-CM

## 2020-02-09 DIAGNOSIS — R209 Unspecified disturbances of skin sensation: Secondary | ICD-10-CM

## 2020-02-09 DIAGNOSIS — E119 Type 2 diabetes mellitus without complications: Secondary | ICD-10-CM

## 2020-02-13 ENCOUNTER — Encounter (INDEPENDENT_AMBULATORY_CARE_PROVIDER_SITE_OTHER): Payer: BC Managed Care – PPO

## 2020-02-13 ENCOUNTER — Encounter (INDEPENDENT_AMBULATORY_CARE_PROVIDER_SITE_OTHER): Payer: BC Managed Care – PPO | Admitting: Vascular Surgery

## 2020-02-14 ENCOUNTER — Other Ambulatory Visit: Payer: Self-pay | Admitting: Family

## 2020-02-14 DIAGNOSIS — I10 Essential (primary) hypertension: Secondary | ICD-10-CM

## 2020-03-05 ENCOUNTER — Encounter (INDEPENDENT_AMBULATORY_CARE_PROVIDER_SITE_OTHER): Payer: BC Managed Care – PPO

## 2020-03-05 ENCOUNTER — Encounter (INDEPENDENT_AMBULATORY_CARE_PROVIDER_SITE_OTHER): Payer: BC Managed Care – PPO | Admitting: Vascular Surgery

## 2020-03-08 ENCOUNTER — Encounter (INDEPENDENT_AMBULATORY_CARE_PROVIDER_SITE_OTHER): Payer: BC Managed Care – PPO | Admitting: Vascular Surgery

## 2020-03-21 ENCOUNTER — Other Ambulatory Visit: Payer: Self-pay | Admitting: Internal Medicine

## 2020-03-29 ENCOUNTER — Encounter: Payer: Self-pay | Admitting: Family

## 2020-04-11 ENCOUNTER — Telehealth: Payer: Self-pay

## 2020-04-11 NOTE — Telephone Encounter (Signed)
Tried to reach patient to let her know appointment was changed to virtual but pt's voicemail is full. Sent myChart message.

## 2020-04-12 ENCOUNTER — Other Ambulatory Visit: Payer: Self-pay

## 2020-04-12 ENCOUNTER — Telehealth (INDEPENDENT_AMBULATORY_CARE_PROVIDER_SITE_OTHER): Payer: BC Managed Care – PPO | Admitting: Family

## 2020-04-12 ENCOUNTER — Encounter: Payer: Self-pay | Admitting: Family

## 2020-04-12 VITALS — Ht 70.0 in

## 2020-04-12 DIAGNOSIS — I10 Essential (primary) hypertension: Secondary | ICD-10-CM

## 2020-04-12 DIAGNOSIS — Z1211 Encounter for screening for malignant neoplasm of colon: Secondary | ICD-10-CM | POA: Diagnosis not present

## 2020-04-12 DIAGNOSIS — E119 Type 2 diabetes mellitus without complications: Secondary | ICD-10-CM | POA: Diagnosis not present

## 2020-04-12 DIAGNOSIS — E785 Hyperlipidemia, unspecified: Secondary | ICD-10-CM | POA: Diagnosis not present

## 2020-04-12 MED ORDER — BASAGLAR KWIKPEN 100 UNIT/ML ~~LOC~~ SOPN: 20.0000 [IU] | PEN_INJECTOR | Freq: Every day | SUBCUTANEOUS | 1 refills | Status: DC

## 2020-04-12 MED ORDER — LIRAGLUTIDE 18 MG/3ML ~~LOC~~ SOPN: PEN_INJECTOR | SUBCUTANEOUS | 12 refills | Status: DC

## 2020-04-12 NOTE — Progress Notes (Signed)
Virtual Visit via Video Note  I connected with@  on 04/15/20 at  4:00 PM EST by a video enabled telemedicine application and verified that I am speaking with the correct person using two identifiers.  Location patient: home Location provider:work  Persons participating in the virtual visit: patient, provider  I discussed the limitations of evaluation and management by telemedicine and the availability of in person appointments. The patient expressed understanding and agreed to proceed.   HPI: Feels well today.  No new complaints  Rash has resolved on leg.   DM- FBG highest is 120. compliant with metformin 1000mg , basaglar 40 units qam, jardiance 25mg  ( increased 12months ago) . No recent yeast infections. No hypoglycemic episodes. Has tried victoza in the past with some nausea however liked the weight loss. Had a lot of nausea with Trulicity.   NO personal or family h/o thyroid cancer.   HTN-compliant with quinapril 40mg , coreg 3.125mg  BID . BP at work 119/86 and occasional 140/80 however this is usually after commute, walk to work. Thinks average BP is 120-130/ 70-80.  No cp, sob.  HLD- compliant with crestor 20mg  QOD.   ROS: See pertinent positives and negatives per HPI.    EXAM:  VITALS per patient if applicable: Ht 5\' 10"  (1.778 m)   BMI 33.78 kg/m  BP Readings from Last 3 Encounters:  01/10/20 (!) 142/90  07/19/19 130/80  01/28/19 128/79   Wt Readings from Last 3 Encounters:  01/10/20 235 lb 6.4 oz (106.8 kg)  07/19/19 236 lb 12.8 oz (107.4 kg)  03/08/19 239 lb (108.4 kg)    GENERAL: alert, oriented, appears well and in no acute distress  HEENT: atraumatic, conjunttiva clear, no obvious abnormalities on inspection of external nose and ears  NECK: normal movements of the head and neck  LUNGS: on inspection no signs of respiratory distress, breathing rate appears normal, no obvious gross SOB, gasping or wheezing  CV: no obvious cyanosis  MS: moves all visible  extremities without noticeable abnormality  PSYCH/NEURO: pleasant and cooperative, no obvious depression or anxiety, speech and thought processing grossly intact  ASSESSMENT AND PLAN:  Discussed the following assessment and plan:  Problem List Items Addressed This Visit      Cardiovascular and Mediastinum   Essential hypertension (Chronic)    Controlled. Continue quinapril 40mg , coreg 3.125mg  BID         Endocrine   Diabetes type 2, controlled (HCC) - Primary (Chronic)    Lab Results  Component Value Date   HGBA1C 7.4 (H) 01/10/2020   Appears better control. Discussed desire to loose weight. Trial of decrease basaglar to 20 units ( from 40units) and start victoza 0.6mg . continue metformin 1000mg ,jardiance 25mg . Goal to get her off of basaglar entirely. Close follow up.       Relevant Medications   liraglutide (VICTOZA) 18 MG/3ML SOPN   Insulin Glargine (BASAGLAR KWIKPEN) 100 UNIT/ML   Other Relevant Orders   Comprehensive metabolic panel   Hemoglobin A1c   Lipid panel     Other   Hyperlipidemia (Chronic)    Uncontrolled. Continue crestor 20mg  QOD.pending lipid panel.       Other Visit Diagnoses    Screen for colon cancer       Relevant Orders   Ambulatory referral to Gastroenterology      -we discussed possible serious and likely etiologies, options for evaluation and workup, limitations of telemedicine visit vs in person visit, treatment, treatment risks and precautions. Pt prefers to treat via telemedicine  empirically rather then risking or undertaking an in person visit at this moment.  .   I discussed the assessment and treatment plan with the patient. The patient was provided an opportunity to ask questions and all were answered. The patient agreed with the plan and demonstrated an understanding of the instructions.   The patient was advised to call back or seek an in-person evaluation if the symptoms worsen or if the condition fails to improve as  anticipated.   Rennie Plowman, FNP

## 2020-04-12 NOTE — Patient Instructions (Signed)
Decrease basaglar  To 20 units the first week, and the second week, I would decrease to 10 units.   Start victoza  Monitor fasting blood gluocose , goal < 120. Call if > 200 or < 70.

## 2020-04-15 ENCOUNTER — Encounter: Payer: Self-pay | Admitting: Family

## 2020-04-15 NOTE — Assessment & Plan Note (Addendum)
Uncontrolled. Continue crestor 20mg  QOD.pending lipid panel.

## 2020-04-15 NOTE — Assessment & Plan Note (Signed)
Controlled. Continue quinapril 40mg , coreg 3.125mg  BID

## 2020-04-15 NOTE — Assessment & Plan Note (Addendum)
Lab Results  Component Value Date   HGBA1C 7.4 (H) 01/10/2020   Appears better control. Discussed desire to loose weight. Trial of decrease basaglar to 20 units ( from 40units) and start victoza 0.6mg . continue metformin 1000mg ,jardiance 25mg . Goal to get her off of basaglar entirely. Close follow up.

## 2020-04-16 NOTE — Progress Notes (Signed)
Pt stated that she had been scheduled, but that they cancelled initial appointment. She said that when she went to reschedule it was the middle of the holiday craziness. She will call back to make sure that she gets an appointment & will let us know if any issues.

## 2020-04-18 ENCOUNTER — Telehealth (INDEPENDENT_AMBULATORY_CARE_PROVIDER_SITE_OTHER): Payer: Self-pay | Admitting: Gastroenterology

## 2020-04-18 ENCOUNTER — Other Ambulatory Visit: Payer: Self-pay

## 2020-04-18 DIAGNOSIS — Z1211 Encounter for screening for malignant neoplasm of colon: Secondary | ICD-10-CM

## 2020-04-18 NOTE — Progress Notes (Signed)
Gastroenterology Pre-Procedure Review  Request Date: Friday 08/16/20 Requesting Physician: Dr. Tobi Bastos  PATIENT REVIEW QUESTIONS: The patient responded to the following health history questions as indicated:    1. Are you having any GI issues? no 2. Do you have a personal history of Polyps? no 3. Do you have a family history of Colon Cancer or Polyps? no 4. Diabetes Mellitus? yes (type 2) 5. Joint replacements in the past 12 months?no 6. Major health problems in the past 3 months?no 7. Any artificial heart valves, MVP, or defibrillator?no    MEDICATIONS & ALLERGIES:    Patient reports the following regarding taking any anticoagulation/antiplatelet therapy:   Plavix, Coumadin, Eliquis, Xarelto, Lovenox, Pradaxa, Brilinta, or Effient? no Aspirin? yes (81 mg daily)  Patient confirms/reports the following medications:  Current Outpatient Medications  Medication Sig Dispense Refill  . ACCU-CHEK FASTCLIX LANCETS MISC   99  . ACCU-CHEK GUIDE test strip   99  . amLODipine (NORVASC) 5 MG tablet Take 1 tablet (5 mg total) by mouth daily. 30 tablet 5  . aspirin EC 81 MG tablet Take 1 tablet (81 mg total) by mouth daily. 90 tablet 3  . azelastine (ASTELIN) 0.1 % nasal spray Place 1 spray into the nose in the morning and at bedtime.    . carvedilol (COREG) 3.125 MG tablet TAKE 1 TABLET BY MOUTH TWICE DAILY WITH A MEAL 180 tablet 6  . empagliflozin (JARDIANCE) 25 MG TABS tablet Take 1 tablet (25 mg total) by mouth daily before breakfast. 90 tablet 1  . Insulin Glargine (BASAGLAR KWIKPEN) 100 UNIT/ML Inject 20 Units into the skin daily. 60 mL 1  . Insulin Pen Needle (BD PEN NEEDLE NANO U/F) 32G X 4 MM MISC See admin instructions.    . liraglutide (VICTOZA) 18 MG/3ML SOPN Start 0.6 mg [place] qd x 1 week, then 1.2 mg [place] qd thereafter 6 mL 12  . metFORMIN (GLUCOPHAGE-XR) 500 MG 24 hr tablet Take 2 tablets (1,000 mg total) by mouth daily with breakfast. 180 tablet 1  . omeprazole (PRILOSEC) 40 MG capsule  Take 1 capsule (40 mg total) by mouth daily. 90 capsule 2  . Prenatal Vit-Fe Fumarate-FA (PRENATAL VITAMIN PO) Take by mouth.    . quinapril (ACCUPRIL) 40 MG tablet Take 1 tablet (40 mg total) by mouth daily. 90 tablet 1  . rosuvastatin (CRESTOR) 20 MG tablet Take 1 tablet (20 mg total) by mouth every other day. 45 tablet 3  . vitamin B-12 (CYANOCOBALAMIN) 1000 MCG tablet Take 1,000 mcg by mouth daily.     No current facility-administered medications for this visit.    Patient confirms/reports the following allergies:  Allergies  Allergen Reactions  . Contrast Media [Iodinated Diagnostic Agents] Anaphylaxis    Wheezing and rash immediately after CT w/.  . Statins Palpitations  . Bactrim [Sulfamethoxazole-Trimethoprim] Itching    itching  . Penicillins Rash  . Trulicity [Dulaglutide] Nausea Only    No orders of the defined types were placed in this encounter.   AUTHORIZATION INFORMATION Primary Insurance: 1D#: Group #:  Secondary Insurance: 1D#: Group #:  SCHEDULE INFORMATION: Date: Friday 08/14/20 Time: Location:[facility]

## 2020-04-29 ENCOUNTER — Telehealth: Payer: Self-pay | Admitting: Family

## 2020-04-29 NOTE — Telephone Encounter (Signed)
Pt no show appt.   [facility].

## 2020-05-21 ENCOUNTER — Other Ambulatory Visit: Payer: Self-pay | Admitting: Family

## 2020-05-21 DIAGNOSIS — E119 Type 2 diabetes mellitus without complications: Secondary | ICD-10-CM

## 2020-05-23 ENCOUNTER — Other Ambulatory Visit: Payer: Self-pay | Admitting: Family

## 2020-05-24 ENCOUNTER — Other Ambulatory Visit: Payer: Self-pay | Admitting: Family

## 2020-06-19 ENCOUNTER — Inpatient Hospital Stay
Admission: EM | Admit: 2020-06-19 | Discharge: 2020-06-23 | DRG: 639 | Disposition: A | Discharge status: Home / Self Care | Payer: BC Managed Care – PPO | Attending: Internal Medicine | Admitting: Internal Medicine

## 2020-06-19 ENCOUNTER — Encounter: Payer: Self-pay | Admitting: Emergency Medicine

## 2020-06-19 ENCOUNTER — Emergency Department: Payer: BC Managed Care – PPO

## 2020-06-19 ENCOUNTER — Other Ambulatory Visit: Payer: Self-pay

## 2020-06-19 DIAGNOSIS — Z8619 Personal history of other infectious and parasitic diseases: Secondary | ICD-10-CM

## 2020-06-19 DIAGNOSIS — Z888 Allergy status to other drugs, medicaments and biological substances status: Secondary | ICD-10-CM | POA: Diagnosis not present

## 2020-06-19 DIAGNOSIS — Z720 Tobacco use: Secondary | ICD-10-CM | POA: Diagnosis not present

## 2020-06-19 DIAGNOSIS — R1084 Generalized abdominal pain: Secondary | ICD-10-CM

## 2020-06-19 DIAGNOSIS — E1110 Type 2 diabetes mellitus with ketoacidosis without coma: Principal | ICD-10-CM | POA: Diagnosis present

## 2020-06-19 DIAGNOSIS — E119 Type 2 diabetes mellitus without complications: Secondary | ICD-10-CM

## 2020-06-19 DIAGNOSIS — R110 Nausea: Secondary | ICD-10-CM | POA: Diagnosis not present

## 2020-06-19 DIAGNOSIS — R933 Abnormal findings on diagnostic imaging of other parts of digestive tract: Secondary | ICD-10-CM

## 2020-06-19 DIAGNOSIS — R112 Nausea with vomiting, unspecified: Secondary | ICD-10-CM

## 2020-06-19 DIAGNOSIS — K219 Gastro-esophageal reflux disease without esophagitis: Secondary | ICD-10-CM | POA: Diagnosis present

## 2020-06-19 DIAGNOSIS — Z9049 Acquired absence of other specified parts of digestive tract: Secondary | ICD-10-CM

## 2020-06-19 DIAGNOSIS — Z20822 Contact with and (suspected) exposure to covid-19: Secondary | ICD-10-CM | POA: Diagnosis present

## 2020-06-19 DIAGNOSIS — K2970 Gastritis, unspecified, without bleeding: Secondary | ICD-10-CM | POA: Diagnosis present

## 2020-06-19 DIAGNOSIS — Z880 Allergy status to penicillin: Secondary | ICD-10-CM | POA: Diagnosis not present

## 2020-06-19 DIAGNOSIS — E1143 Type 2 diabetes mellitus with diabetic autonomic (poly)neuropathy: Secondary | ICD-10-CM | POA: Diagnosis present

## 2020-06-19 DIAGNOSIS — Z881 Allergy status to other antibiotic agents status: Secondary | ICD-10-CM

## 2020-06-19 DIAGNOSIS — E669 Obesity, unspecified: Secondary | ICD-10-CM | POA: Diagnosis present

## 2020-06-19 DIAGNOSIS — Z87440 Personal history of urinary (tract) infections: Secondary | ICD-10-CM

## 2020-06-19 DIAGNOSIS — F1020 Alcohol dependence, uncomplicated: Secondary | ICD-10-CM | POA: Diagnosis present

## 2020-06-19 DIAGNOSIS — Z6833 Body mass index (BMI) 33.0-33.9, adult: Secondary | ICD-10-CM

## 2020-06-19 DIAGNOSIS — R1030 Lower abdominal pain, unspecified: Secondary | ICD-10-CM

## 2020-06-19 DIAGNOSIS — Z79899 Other long term (current) drug therapy: Secondary | ICD-10-CM

## 2020-06-19 DIAGNOSIS — Z833 Family history of diabetes mellitus: Secondary | ICD-10-CM | POA: Diagnosis not present

## 2020-06-19 DIAGNOSIS — E785 Hyperlipidemia, unspecified: Secondary | ICD-10-CM | POA: Diagnosis present

## 2020-06-19 DIAGNOSIS — Z7982 Long term (current) use of aspirin: Secondary | ICD-10-CM

## 2020-06-19 DIAGNOSIS — I10 Essential (primary) hypertension: Secondary | ICD-10-CM | POA: Diagnosis present

## 2020-06-19 DIAGNOSIS — Z8249 Family history of ischemic heart disease and other diseases of the circulatory system: Secondary | ICD-10-CM | POA: Diagnosis not present

## 2020-06-19 DIAGNOSIS — F17210 Nicotine dependence, cigarettes, uncomplicated: Secondary | ICD-10-CM | POA: Diagnosis present

## 2020-06-19 DIAGNOSIS — Z794 Long term (current) use of insulin: Secondary | ICD-10-CM

## 2020-06-19 DIAGNOSIS — D72829 Elevated white blood cell count, unspecified: Secondary | ICD-10-CM | POA: Diagnosis present

## 2020-06-19 DIAGNOSIS — Z7984 Long term (current) use of oral hypoglycemic drugs: Secondary | ICD-10-CM

## 2020-06-19 DIAGNOSIS — R109 Unspecified abdominal pain: Secondary | ICD-10-CM

## 2020-06-19 DIAGNOSIS — Z91041 Radiographic dye allergy status: Secondary | ICD-10-CM | POA: Diagnosis not present

## 2020-06-19 DIAGNOSIS — Z83438 Family history of other disorder of lipoprotein metabolism and other lipidemia: Secondary | ICD-10-CM

## 2020-06-19 LAB — BASIC METABOLIC PANEL
Anion gap: 14 (ref 5–15)
Anion gap: 15 (ref 5–15)
Anion gap: 14 (ref 5–15)
Anion gap: 12 (ref 5–15)
BUN: 14 mg/dL (ref 6–20)
BUN: 14 mg/dL (ref 6–20)
BUN: 14 mg/dL (ref 6–20)
BUN: 15 mg/dL (ref 6–20)
CO2: 15 mmol/L — ABNORMAL LOW (ref 22–32)
CO2: 14 mmol/L — ABNORMAL LOW (ref 22–32)
CO2: 16 mmol/L — ABNORMAL LOW (ref 22–32)
CO2: 17 mmol/L — ABNORMAL LOW (ref 22–32)
Calcium: 8.8 mg/dL — ABNORMAL LOW (ref 8.9–10.3)
Calcium: 9.2 mg/dL (ref 8.9–10.3)
Calcium: 9.1 mg/dL (ref 8.9–10.3)
Calcium: 9 mg/dL (ref 8.9–10.3)
Chloride: 107 mmol/L (ref 98–111)
Chloride: 107 mmol/L (ref 98–111)
Chloride: 107 mmol/L (ref 98–111)
Chloride: 106 mmol/L (ref 98–111)
Creatinine, Ser: 0.71 mg/dL (ref 0.44–1.00)
Creatinine, Ser: 0.8 mg/dL (ref 0.44–1.00)
Creatinine, Ser: 0.79 mg/dL (ref 0.44–1.00)
Creatinine, Ser: 0.78 mg/dL (ref 0.44–1.00)
GFR, Estimated: >60 mL/min (ref >60)
GFR, Estimated: >60 mL/min (ref >60)
GFR, Estimated: >60 mL/min (ref >60)
GFR, Estimated: >60 mL/min (ref >60)
Glucose, Bld: 142 mg/dL — ABNORMAL HIGH (ref 70–99)
Glucose, Bld: 132 mg/dL — ABNORMAL HIGH (ref 70–99)
Glucose, Bld: 137 mg/dL — ABNORMAL HIGH (ref 70–99)
Glucose, Bld: 147 mg/dL — ABNORMAL HIGH (ref 70–99)
Potassium: 4.3 mmol/L (ref 3.5–5.1)
Potassium: 4.6 mmol/L (ref 3.5–5.1)
Potassium: 4.8 mmol/L (ref 3.5–5.1)
Potassium: 4.6 mmol/L (ref 3.5–5.1)
Sodium: 136 mmol/L (ref 135–145)
Sodium: 136 mmol/L (ref 135–145)
Sodium: 137 mmol/L (ref 135–145)
Sodium: 135 mmol/L (ref 135–145)

## 2020-06-19 LAB — CBC WITH DIFFERENTIAL/PLATELET
Abs Immature Granulocytes: 0.13 10*3/uL — ABNORMAL HIGH (ref 0.00–0.07)
Basophils Absolute: 0.1 10*3/uL (ref 0.0–0.1)
Basophils Relative: 1 %
Eosinophils Absolute: 0 10*3/uL (ref 0.0–0.5)
Eosinophils Relative: 0 %
HCT: 46.4 % — ABNORMAL HIGH (ref 36.0–46.0)
Hemoglobin: 16 g/dL — ABNORMAL HIGH (ref 12.0–15.0)
Immature Granulocytes: 1 %
Lymphocytes Relative: 8 %
Lymphs Abs: 1.3 10*3/uL (ref 0.7–4.0)
MCH: 30.3 pg (ref 26.0–34.0)
MCHC: 34.5 g/dL (ref 30.0–36.0)
MCV: 87.9 fL (ref 80.0–100.0)
Monocytes Absolute: 0.7 10*3/uL (ref 0.1–1.0)
Monocytes Relative: 4 %
Neutro Abs: 14.2 10*3/uL — ABNORMAL HIGH (ref 1.7–7.7)
Neutrophils Relative %: 86 %
Platelets: 294 10*3/uL (ref 150–400)
RBC: 5.28 MIL/uL — ABNORMAL HIGH (ref 3.87–5.11)
RDW: 12.4 % (ref 11.5–15.5)
WBC: 16.4 10*3/uL — ABNORMAL HIGH (ref 4.0–10.5)
nRBC: 0 % (ref 0.0–0.2)

## 2020-06-19 LAB — URINE DRUG SCREEN, QUALITATIVE (ARMC ONLY)
Amphetamines, Ur Screen: NOT DETECTED
Barbiturates, Ur Screen: NOT DETECTED
Benzodiazepine, Ur Scrn: NOT DETECTED
Cannabinoid 50 Ng, Ur ~~LOC~~: NOT DETECTED
Cocaine Metabolite,Ur ~~LOC~~: NOT DETECTED
MDMA (Ecstasy)Ur Screen: NOT DETECTED
Methadone Scn, Ur: NOT DETECTED
Opiate, Ur Screen: POSITIVE — AB
Phencyclidine (PCP) Ur S: NOT DETECTED
Tricyclic, Ur Screen: NOT DETECTED

## 2020-06-19 LAB — GLUCOSE, CAPILLARY
Glucose-Capillary: 136 mg/dL — ABNORMAL HIGH (ref 70–99)
Glucose-Capillary: 123 mg/dL — ABNORMAL HIGH (ref 70–99)
Glucose-Capillary: 124 mg/dL — ABNORMAL HIGH (ref 70–99)
Glucose-Capillary: 135 mg/dL — ABNORMAL HIGH (ref 70–99)
Glucose-Capillary: 145 mg/dL — ABNORMAL HIGH (ref 70–99)
Glucose-Capillary: 105 mg/dL — ABNORMAL HIGH (ref 70–99)
Glucose-Capillary: 152 mg/dL — ABNORMAL HIGH (ref 70–99)
Glucose-Capillary: 138 mg/dL — ABNORMAL HIGH (ref 70–99)
Glucose-Capillary: 154 mg/dL — ABNORMAL HIGH (ref 70–99)

## 2020-06-19 LAB — URINALYSIS, ROUTINE W REFLEX MICROSCOPIC
Bacteria, UA: NONE SEEN
Bilirubin Urine: NEGATIVE
Glucose, UA: >=500 mg/dL — AB
Hgb urine dipstick: NEGATIVE
Ketones, ur: 80 mg/dL — AB
Leukocytes,Ua: NEGATIVE
Nitrite: NEGATIVE
Protein, ur: NEGATIVE mg/dL
Specific Gravity, Urine: 1.028 (ref 1.005–1.030)
pH: 5 (ref 5.0–8.0)

## 2020-06-19 LAB — COMPREHENSIVE METABOLIC PANEL
ALT: 29 U/L (ref 0–44)
AST: 28 U/L (ref 15–41)
Albumin: 5 g/dL (ref 3.5–5.0)
Alkaline Phosphatase: 63 U/L (ref 38–126)
Anion gap: 15 (ref 5–15)
BUN: 15 mg/dL (ref 6–20)
CO2: 19 mmol/L — ABNORMAL LOW (ref 22–32)
Calcium: 9.8 mg/dL (ref 8.9–10.3)
Chloride: 101 mmol/L (ref 98–111)
Creatinine, Ser: 0.72 mg/dL (ref 0.44–1.00)
GFR, Estimated: >60 mL/min (ref >60)
Glucose, Bld: 179 mg/dL — ABNORMAL HIGH (ref 70–99)
Potassium: 4.5 mmol/L (ref 3.5–5.1)
Sodium: 135 mmol/L (ref 135–145)
Total Bilirubin: 1.2 mg/dL (ref 0.3–1.2)
Total Protein: 8.6 g/dL — ABNORMAL HIGH (ref 6.5–8.1)

## 2020-06-19 LAB — HEMOGLOBIN A1C
Hgb A1c MFr Bld: 7.2 % — ABNORMAL HIGH (ref 4.8–5.6)
Mean Plasma Glucose: 159.94 mg/dL

## 2020-06-19 LAB — LIPASE, BLOOD: Lipase: 27 U/L (ref 11–51)

## 2020-06-19 LAB — RESP PANEL BY RT-PCR (FLU A&B, COVID) ARPGX2
Influenza A by PCR: NEGATIVE
Influenza B by PCR: NEGATIVE
SARS Coronavirus 2 by RT PCR: NEGATIVE

## 2020-06-19 LAB — PHOSPHORUS: Phosphorus: 4.5 mg/dL (ref 2.5–4.6)

## 2020-06-19 LAB — MAGNESIUM: Magnesium: 2.4 mg/dL (ref 1.7–2.4)

## 2020-06-19 LAB — BETA-HYDROXYBUTYRIC ACID
Beta-Hydroxybutyric Acid: 4.78 mmol/L — ABNORMAL HIGH (ref 0.05–0.27)
Beta-Hydroxybutyric Acid: 3.56 mmol/L — ABNORMAL HIGH (ref 0.05–0.27)

## 2020-06-19 LAB — HIV ANTIBODY (ROUTINE TESTING W REFLEX): HIV Screen 4th Generation wRfx: NONREACTIVE

## 2020-06-19 LAB — MRSA PCR SCREENING: MRSA by PCR: NEGATIVE

## 2020-06-19 MED ORDER — PANTOPRAZOLE SODIUM 40 MG IV SOLR
40.0000 mg | Freq: Once | INTRAVENOUS | Status: AC
  Administered 2020-06-19: 40 mg via INTRAVENOUS
  Filled 2020-06-19: qty 40

## 2020-06-19 MED ORDER — INSULIN REGULAR(HUMAN) IN NACL 100-0.9 UT/100ML-% IV SOLN
INTRAVENOUS | Status: AC
  Administered 2020-06-19: 3 [IU]/h via INTRAVENOUS
  Administered 2020-06-21: 3.2 [IU]/h via INTRAVENOUS
  Filled 2020-06-19 (×2): qty 100

## 2020-06-19 MED ORDER — ASPIRIN EC 81 MG PO TBEC
81.0000 mg | DELAYED_RELEASE_TABLET | Freq: Every day | ORAL | Status: DC
  Administered 2020-06-19 – 2020-06-23 (×3): 81 mg via ORAL
  Filled 2020-06-19 (×4): qty 1

## 2020-06-19 MED ORDER — ENOXAPARIN SODIUM 40 MG/0.4ML ~~LOC~~ SOLN
40.0000 mg | SUBCUTANEOUS | Status: DC
  Administered 2020-06-19 – 2020-06-20 (×2): 40 mg via SUBCUTANEOUS
  Filled 2020-06-19 (×2): qty 0.4

## 2020-06-19 MED ORDER — SODIUM CHLORIDE 0.9 % IV SOLN: INTRAVENOUS | Status: DC

## 2020-06-19 MED ORDER — SODIUM CHLORIDE 0.9 % IV BOLUS (SEPSIS)
1000.0000 mL | Freq: Once | INTRAVENOUS | Status: AC
  Administered 2020-06-19: 1000 mL via INTRAVENOUS

## 2020-06-19 MED ORDER — ROSUVASTATIN CALCIUM 10 MG PO TABS
20.0000 mg | ORAL_TABLET | ORAL | Status: DC
  Administered 2020-06-19 – 2020-06-23 (×2): 20 mg via ORAL
  Filled 2020-06-19: qty 1
  Filled 2020-06-19: qty 2

## 2020-06-19 MED ORDER — DEXTROSE IN LACTATED RINGERS 5 % IV SOLN: INTRAVENOUS | Status: DC

## 2020-06-19 MED ORDER — CARVEDILOL 3.125 MG PO TABS
3.1250 mg | ORAL_TABLET | Freq: Two times a day (BID) | ORAL | Status: DC
  Administered 2020-06-19 – 2020-06-23 (×7): 3.125 mg via ORAL
  Filled 2020-06-19 (×7): qty 1

## 2020-06-19 MED ORDER — AMLODIPINE BESYLATE 5 MG PO TABS
5.0000 mg | ORAL_TABLET | Freq: Every day | ORAL | Status: DC
Start: 2020-06-19 — End: 2020-06-23
  Administered 2020-06-19 – 2020-06-23 (×4): 5 mg via ORAL
  Filled 2020-06-19 (×4): qty 1

## 2020-06-19 MED ORDER — QUINAPRIL HCL 10 MG PO TABS
40.0000 mg | ORAL_TABLET | Freq: Every day | ORAL | Status: DC
Start: 2020-06-19 — End: 2020-06-23
  Administered 2020-06-20 – 2020-06-23 (×3): 40 mg via ORAL
  Filled 2020-06-19 (×6): qty 4

## 2020-06-19 MED ORDER — LORAZEPAM 1 MG PO TABS: 1.0000 mg | ORAL_TABLET | ORAL | Status: AC | PRN

## 2020-06-19 MED ORDER — METOCLOPRAMIDE HCL 5 MG/ML IJ SOLN
10.0000 mg | Freq: Once | INTRAMUSCULAR | Status: AC
  Administered 2020-06-19: 10 mg via INTRAVENOUS
  Filled 2020-06-19: qty 2

## 2020-06-19 MED ORDER — METOCLOPRAMIDE HCL 5 MG/ML IJ SOLN
10.0000 mg | Freq: Four times a day (QID) | INTRAMUSCULAR | Status: AC
  Administered 2020-06-19 (×3): 10 mg via INTRAVENOUS
  Filled 2020-06-19 (×3): qty 2

## 2020-06-19 MED ORDER — METRONIDAZOLE IN NACL 5-0.79 MG/ML-% IV SOLN
500.0000 mg | Freq: Once | INTRAVENOUS | Status: AC
  Administered 2020-06-19: 500 mg via INTRAVENOUS
  Filled 2020-06-19: qty 100

## 2020-06-19 MED ORDER — CHLORHEXIDINE GLUCONATE CLOTH 2 % EX PADS
6.0000 | MEDICATED_PAD | Freq: Every day | CUTANEOUS | Status: DC
  Administered 2020-06-19 – 2020-06-23 (×4): 6 via TOPICAL

## 2020-06-19 MED ORDER — ONDANSETRON HCL 4 MG/2ML IJ SOLN
4.0000 mg | Freq: Four times a day (QID) | INTRAMUSCULAR | Status: DC | PRN
  Administered 2020-06-19 – 2020-06-23 (×11): 4 mg via INTRAVENOUS
  Filled 2020-06-19 (×11): qty 2

## 2020-06-19 MED ORDER — ONDANSETRON HCL 4 MG/2ML IJ SOLN
4.0000 mg | Freq: Once | INTRAMUSCULAR | Status: AC
  Administered 2020-06-19: 4 mg via INTRAVENOUS
  Filled 2020-06-19: qty 2

## 2020-06-19 MED ORDER — THIAMINE HCL 100 MG PO TABS
100.0000 mg | ORAL_TABLET | Freq: Every day | ORAL | Status: DC
  Administered 2020-06-20 – 2020-06-23 (×2): 100 mg via ORAL
  Filled 2020-06-19 (×3): qty 1

## 2020-06-19 MED ORDER — FOLIC ACID 1 MG PO TABS
1.0000 mg | ORAL_TABLET | Freq: Every day | ORAL | Status: DC
  Administered 2020-06-20 – 2020-06-23 (×2): 1 mg via ORAL
  Filled 2020-06-19 (×3): qty 1

## 2020-06-19 MED ORDER — LACTATED RINGERS IV SOLN: INTRAVENOUS | Status: DC

## 2020-06-19 MED ORDER — POTASSIUM CHLORIDE 10 MEQ/100ML IV SOLN: 10.0000 meq | INTRAVENOUS | Status: AC

## 2020-06-19 MED ORDER — SODIUM CHLORIDE 0.9 % IV SOLN
25.0000 mg | Freq: Once | INTRAVENOUS | Status: AC
  Administered 2020-06-19: 25 mg via INTRAVENOUS
  Filled 2020-06-19 (×2): qty 1

## 2020-06-19 MED ORDER — ADULT MULTIVITAMIN W/MINERALS CH
1.0000 | ORAL_TABLET | Freq: Every day | ORAL | Status: DC
  Administered 2020-06-20 – 2020-06-23 (×2): 1 via ORAL
  Filled 2020-06-19 (×3): qty 1

## 2020-06-19 MED ORDER — PANTOPRAZOLE SODIUM 40 MG IV SOLR
40.0000 mg | INTRAVENOUS | Status: DC
  Administered 2020-06-19 – 2020-06-23 (×5): 40 mg via INTRAVENOUS
  Filled 2020-06-19 (×5): qty 40

## 2020-06-19 MED ORDER — MORPHINE SULFATE (PF) 2 MG/ML IV SOLN: 2.0000 mg | INTRAVENOUS | Status: DC | PRN

## 2020-06-19 MED ORDER — CIPROFLOXACIN IN D5W 400 MG/200ML IV SOLN
400.0000 mg | Freq: Once | INTRAVENOUS | Status: AC
  Administered 2020-06-19: 400 mg via INTRAVENOUS
  Filled 2020-06-19: qty 200

## 2020-06-19 MED ORDER — LORAZEPAM 2 MG/ML IJ SOLN
1.0000 mg | INTRAMUSCULAR | Status: AC | PRN
Start: 2020-06-19 — End: 2020-06-22

## 2020-06-19 MED ORDER — LACTATED RINGERS IV BOLUS: 20.0000 mL/kg | Freq: Once | INTRAVENOUS | Status: DC

## 2020-06-19 MED ORDER — DEXTROSE 50 % IV SOLN
0.0000 mL | INTRAVENOUS | Status: DC | PRN
Start: 2020-06-19 — End: 2020-06-23

## 2020-06-19 MED ORDER — MORPHINE SULFATE (PF) 4 MG/ML IV SOLN
4.0000 mg | Freq: Once | INTRAVENOUS | Status: AC
Start: 2020-06-19 — End: 2020-06-19
  Administered 2020-06-19: 4 mg via INTRAVENOUS
  Filled 2020-06-19: qty 1

## 2020-06-19 MED ORDER — THIAMINE HCL 100 MG/ML IJ SOLN
100.0000 mg | Freq: Every day | INTRAMUSCULAR | Status: DC
  Administered 2020-06-21: 100 mg via INTRAVENOUS
  Filled 2020-06-19: qty 2

## 2020-06-19 MED ORDER — DEXTROSE 10 % IV SOLN: INTRAVENOUS | Status: DC

## 2020-06-19 MED ORDER — SODIUM CHLORIDE 0.9 % IV BOLUS
1000.0000 mL | Freq: Once | INTRAVENOUS | Status: AC
  Administered 2020-06-19: 1000 mL via INTRAVENOUS

## 2020-06-19 MED ORDER — DIPHENHYDRAMINE HCL 50 MG/ML IJ SOLN
25.0000 mg | Freq: Once | INTRAMUSCULAR | Status: AC
  Administered 2020-06-19: 25 mg via INTRAVENOUS
  Filled 2020-06-19: qty 1

## 2020-06-19 NOTE — ED Notes (Signed)
ED Provider at bedside. 

## 2020-06-19 NOTE — ED Notes (Signed)
Report given to CPOD Nurse Wynona Canes

## 2020-06-19 NOTE — ED Notes (Signed)
Hospitalist at bedside 

## 2020-06-19 NOTE — ED Triage Notes (Signed)
Pt to triage via w/c with no distress noted; pt reports since last night having N/V and generalized abd pain after eating popcorn

## 2020-06-19 NOTE — ED Notes (Signed)
Patient transported to CT 

## 2020-06-19 NOTE — Consult Note (Signed)
GI Inpatient Consult Note  Reason for Consult: Bilateral lower abdominal pain with associated nausea and vomiting, abnormal CT scan   Attending Requesting Consult: Dr. Lucile Shutters, MD  History of Present Illness: Julia Brown is a 50 y.o. female seen for evaluation of nausea, vomiting, and bilateral lower abdominal pain in setting of abnormal CT scan - abnormal ileocecal valve at the request of Dr. Joylene Igo. Pt has a PMH of HTN, HLD, IDDM, and GERD. She presented to the [facility] ED early this morning after acute onset of nausea, vomiting, and diffuse abdominal pain that started last night about 30 minutes after eating popcorn. She reports she had about two episodes per hour of non-bloody and non-bilious emesis prior to arrival to the ED. She denies any change in her bowel habits, diarrhea, constipation, hematochezia, or melena. She reports she had formed stool last night which was brown in color. Upon presentation to the ED, she had labs showing leukocytosis WBC 16.4, hemoglobin 16.0, platelets 294K, sodium 135, potassium 4.5, BUN 15, creatinine 0.72, and normal LFTs. Urine drug screen was positive for opiates. Urinalysis withn normal limits. Non-contrasted CT scan showed enlargement of ileocecal valve protruding into the cecal lumen which could be secondary to local edema or infiltration. Repeat electrolytes today show worsening metabolic acidosis (serum bicarb 14) with anion gap of 14. She is being started on insulin drip for DKA. Patient reports her abdominal pain, nausea, and vomiting symptoms are consistent with her previous ED presentations. She last presented to the ED 01/2019 for acute onset epigastric abdominal pain, nausea, and vomiting where she was treated for gastritis with Reglan, Benadryl, Senna-S, and PPI. CT scan at this time was negative for any acute findings. She was also seen in the ED 07/2017 for presumed gastritis flare and was treated with Carafate and Pepcid. She reports her pain  this time is in the bilateral lower abdomen and this is new as previously her pain was located in epigastrium or in her upper abdominal quadrants. Patient denies any associated change in her bowel habits, hematochezia, melena, or diarrhea. She denies fever. She reports last episode of emesis was one hour ago and was light yellow in color. She has been given IV fluid hydration, IV PPI, scheduled IV Reglan, and was started on empiric antibiotic therapy with Cipro and Flagyl. She currently rates pain 6/10 in severity. Nausea bothers her more than the pain. She is scheduled for outpatient screening colonoscopy with Dr. Tobi Bastos in two months. No prior endoscopy. She denies any significant use of NSAIDs. She denies chronic narcotic use. She denies any family history of inflammatory bowel disease.    Past Medical History:  Past Medical History:  Diagnosis Date  . Chicken pox   . Diabetes mellitus   . GERD (gastroesophageal reflux disease)   . Hyperlipidemia   . Hypertension   . UTI (lower urinary tract infection)     Problem List: Patient Active Problem List   Diagnosis Date Noted  . Abdominal pain 06/19/2020  . Snores 03/08/2019  . Abscess or cellulitis of foot 01/13/2019  . Hepatic steatosis 09/14/2018  . Chronic low back pain with right-sided sciatica 12/13/2017  . Vaginal itching 12/13/2017  . Palpitations 05/20/2016  . Anxiety state 01/14/2016  . Vitamin D deficiency 05/07/2015  . Essential hypertension 01/31/2015  . Vulvovaginal candidiasis 11/01/2014  . Muscle spasm 04/26/2014  . Routine general medical examination at a health care facility 08/28/2013  . Adjustment disorder with mixed anxiety and depressed mood 08/28/2013  .  Tobacco abuse 08/28/2013  . Obesity (BMI 30-39.9) 08/28/2013  . GERD (gastroesophageal reflux disease) 08/12/2012  . Hair loss 08/12/2012  . Hyperlipidemia 11/10/2010  . Diabetes type 2, controlled (HCC) 11/10/2010    Past Surgical History: Past Surgical  History:  Procedure Laterality Date  . ABLATION  2007   excessive bleeding  . CESAREAN SECTION  1994  . CHOLECYSTECTOMY  1993  . ORIF FEMUR FRACTURE Right    [place]  . TUBAL LIGATION    . uterine ablation      Allergies: Allergies  Allergen Reactions  . Contrast Media [Iodinated Diagnostic Agents] Anaphylaxis    Wheezing and rash immediately after CT w/.  . Statins Palpitations  . Bactrim [Sulfamethoxazole-Trimethoprim] Itching    itching  . Penicillins Rash  . Trulicity [Dulaglutide] Nausea Only    Home Medications: (Not in a hospital admission)  Home medication reconciliation was completed with the patient.   Scheduled Inpatient Medications:   . amLODipine  5 mg Oral Daily  . aspirin EC  81 mg Oral Daily  . carvedilol  3.125 mg Oral BID WC  . enoxaparin (LOVENOX) injection  40 mg Subcutaneous Q24H  . metoCLOPramide (REGLAN) injection  10 mg Intravenous Q6H  . pantoprazole (PROTONIX) IV  40 mg Intravenous Q24H  . quinapril  40 mg Oral Daily  . rosuvastatin  20 mg Oral QODAY    Continuous Inpatient Infusions:   . sodium chloride    . metronidazole 500 mg (06/19/20 1047)    PRN Inpatient Medications:  morphine injection  Family History: family history includes Cancer in her maternal aunt; Depression in her mother; Diabetes in her father, maternal grandfather, maternal grandmother, and mother; Heart disease in her father and another family member; Hyperlipidemia in her father and mother; Hypertension in her mother; Schizophrenia in her mother.  The patient's family history is negative for inflammatory bowel disorders, GI malignancy, or solid organ transplantation.  Social History:   reports that she has been smoking cigarettes. She has a 5.00 pack-year smoking history. She has never used smokeless tobacco. She reports current alcohol use. She reports that she does not use drugs. The patient denies ETOH, tobacco, or drug use.   Review of Systems: Constitutional:  Weight is stable.  Eyes: No changes in vision. ENT: No oral lesions, sore throat.  GI: see HPI.  Heme/Lymph: No easy bruising.  CV: No chest pain.  GU: No hematuria.  Integumentary: No rashes.  Neuro: No headaches.  Psych: No depression/anxiety.  Endocrine: No heat/cold intolerance.  Allergic/Immunologic: No urticaria.  Resp: No cough, SOB.  Musculoskeletal: No joint swelling.    Physical Examination: BP (!) 171/83   Pulse (!) 114   Temp 98.5 F (36.9 C) (Oral)   Resp 18   Ht 5\' 10"  (1.778 m)   Wt 106.6 kg   SpO2 96%   BMI 33.72 kg/m  Gen: NAD, alert and oriented x 4 HEENT: PEERLA, EOMI, Neck: supple, no JVD or thyromegaly Chest: CTA bilaterally, no wheezes, crackles, or other adventitious sounds CV: RRR, no m/g/c/r Abd: soft, NT, ND, +BS in all four quadrants; no HSM, guarding, ridigity, or rebound tenderness Ext: no edema, well perfused with 2+ pulses, Skin: no rash or lesions noted Lymph: no LAD  Data: Lab Results  Component Value Date   WBC 16.4 (H) 06/19/2020   HGB 16.0 (H) 06/19/2020   HCT 46.4 (H) 06/19/2020   MCV 87.9 06/19/2020   PLT 294 06/19/2020   Recent Labs  Lab 06/19/20  0516  HGB 16.0*   Lab Results  Component Value Date   NA 135 06/19/2020   K 4.5 06/19/2020   CL 101 06/19/2020   CO2 19 (L) 06/19/2020   BUN 15 06/19/2020   CREATININE 0.72 06/19/2020   GLU 219 09/15/2013   Lab Results  Component Value Date   ALT 29 06/19/2020   AST 28 06/19/2020   ALKPHOS 63 06/19/2020   BILITOT 1.2 06/19/2020   No results for input(s): APTT, INR, PTT in the last 168 hours.   CT scan without contrast 06/19/2020: FINDINGS: Lower chest: The visualized lung bases are clear bilaterally. Visualized heart and pericardium are unremarkable.  Hepatobiliary: No focal liver abnormality is seen. Status post cholecystectomy. No biliary dilatation.  Pancreas: Unremarkable  Spleen: Unremarkable  Adrenals/Urinary Tract: Adrenal glands are  unremarkable. Kidneys are normal, without renal calculi, focal lesion, or hydronephrosis. Bladder is unremarkable.  Stomach/Bowel: The ileocecal valve appears enlarged and protrudes into the colonic lumen, best noted on coronal imaging. This is new since prior examination. There is no evidence of obstruction. This may relate to localized edema though an infiltrative mass could appear similarly. Mild cecal diverticulosis is also identified. No significant pericecal inflammatory change, however, is identified. The stomach, small bowel, and large bowel are otherwise unremarkable. Appendix normal. No free intraperitoneal gas or fluid.  Vascular/Lymphatic: No significant vascular findings are present. No enlarged abdominal or pelvic lymph nodes.  Reproductive: Uterus and bilateral adnexa are unremarkable.  Other: Mild subcutaneous infiltration of the pannus is again identified possibly representing benign edema though inflammatory conditions, such as panniculitis, could appear in similar fashion.  Musculoskeletal: Right hip pinning has been performed. No acute bone abnormality. No lytic or blastic bone lesions are identified.  IMPRESSION: Enlargement of the ileocecal valve which protrudes into the cecal lumen. This may relate to local edema or infiltration and correlation with the patient's recent screening endoscopy is recommended. No evidence of obstruction at this time.   Assessment/Plan:  50 y/o AA female with a PMH of HTN, HLD, IDDM, and GERD admitted for severe bilateral lower abdominal pain with associated nausea and vomiting. CT scan showed enlargement of the ileocecal valve which protrudes into the cecal lumen.  1. Bilateral lower abdominal pain - unclear etiology at present time. Differential includes bacterial/viral gastroenteritis, partial gastric outlet obstruction, peptic ulcer disease +/- H pylori, gastritis, diabetic gastroparesis, small bowel Crohn's disease, functional GI  disorder, or malignancy   2. Non-intractable nausea and vomiting  3. Abnormal CT scan - enlargement of ileocecal valve which protrudes into the cecal lumen, possibly 2/2 local edema or infiltration  4. Leukocytosis with left shift - WBC 16.4 on admission with left shift and increased neutrophils  5. Insulin-dependent diabetes mellitus - evidence of DKA based on labs from today with metabolic acidosis. Hospitalist following and started insulin drip.   COVID-19 TEST: NEGATIVE   Recommendations:  - We will order upper GI series with small bowel follow through for structural evaluation to rule out gastric outlet obstruction, peptic ulcer, mass lesion, small bowel ulcerations, etc - Check H pylori stool antigen - No indication for urgent colonoscopy at present time. No concerning LGI symptoms such as diarrhea, change in bowel habits, hematochezia, melena, severe anemia, etc - Patient does not colonoscopy for visualization of her terminal ileum and cecum and for colon cancer screening. This can likely be performed as an outpatient after acute symptoms resolve. - Continue supportive care with pain control and anti-emetics - Continue Reglan IV scheduled  for now - If no significant clinical improvement in 24-48 hours, will consider inpatient endoscopic procedures - Following along with you   Thank you for the consult. Please call with questions or concerns.  Mickle Mallory [facility] [phone] (Cell)

## 2020-06-19 NOTE — ED Provider Notes (Signed)
-----------------------------------------   10:15 AM on 06/19/2020 -----------------------------------------  Patient care assumed from Dr. Elesa Massed.  Patient continues with significant nausea despite multiple rounds of antiemetics.  We will continue to IV hydrate as well as treat nausea.  Patient CT scan shows enlargement of the ileocecal valve protruding into the cecal lumen possible local edema or infiltration.  Patient actually has a colonoscopy scheduled for next month.  However given this finding I do believe covering the patient for possible focal colitis would be warranted.  As the patient is still unable to tolerate anything by mouth we will discuss with the hospitalist for admission.  Patient agreeable to plan of care.   Minna Antis, MD 06/19/20 1016

## 2020-06-19 NOTE — H&P (Addendum)
History and Physical    [name] UYQ:[id number] DOB: [birth year] DOA: 06/19/2020  PCP: Allegra Grana, FNP   Patient coming from: Home  I have personally briefly reviewed patient's old medical records in [facility]  Chief Complaint: Abdominal pain  HPI: Julia Brown is a 50 y.o. female with medical history significant for insulin-dependent diabetes mellitus, GERD, gastritis, hypertension, dyslipidemia who presents to the ER for evaluation of abdominal pain which started acutely at about 7 PM last night after patient ate some popcorn.  Abdominal pain is diffuse, rated an 8 x 10 in intensity at its worst associated with nausea and multiple episodes of vomiting. She denies having any changes in her bowel habits prior to coming to the emergency room.  No constipation or diarrhea.  She denies having any fever, no chills, no nocturia, no dysuria, no dizziness, no lightheadedness, no headache, no shortness of breath, no chest pain, no palpitations, no diaphoresis, no dysuria, no frequency of urination. Patient denies having a known history of diverticulosis.  Labs show sodium 135, potassium 4.5 chloride 101, bicarb 19, glucose 179, BUN 15, creatinine 0.72, calcium 9.8, alkaline phosphatase 63, albumin 5.0, lipase 20, AST 28, ALT 29, total protein 8, total bilirubin 1.2, white count 16.4, hemoglobin 16.0, hematocrit 46.4, MCV 87.9, RDW 12.4, platelet count 294 Urine analysis is sterile Urine drug screen is positive for phencyclidine CT scan of abdomen and pelvis shows enlargement of the ileocecal valve which protrudes into the cecal lumen. This may relate to local edema or infiltration and correlation with the patient's recent screening endoscopy is recommended. No evidence of obstruction at this time.     ED Course: 50 year old African-American female with a past medical history significant for insulin-dependent diabetes mellitus, hypertension, nicotine/alcohol dependence who  presents to the ER for evaluation of diffuse abdominal pain that started acutely after she ate some popcorn associated with nausea and multiple episodes of emesis.  Patient received multiple antiemetics in the ER as well as IV morphine with significant improvement in his symptoms.  She also received IV fluids and empiric antibiotic therapy with ciprofloxacin and Flagyl.  She will be referred to observation status for further evaluation.  Review of Systems: As per HPI otherwise all other systems reviewed and negative.    Past Medical History:  Diagnosis Date  . Chicken pox   . Diabetes mellitus   . GERD (gastroesophageal reflux disease)   . Hyperlipidemia   . Hypertension   . UTI (lower urinary tract infection)     Past Surgical History:  Procedure Laterality Date  . ABLATION  2007   excessive bleeding  . CESAREAN SECTION  1994  . CHOLECYSTECTOMY  1993  . ORIF FEMUR FRACTURE Right    [place]  . TUBAL LIGATION    . uterine ablation       reports that she has been smoking cigarettes. She has a 5.00 pack-year smoking history. She has never used smokeless tobacco. She reports current alcohol use. She reports that she does not use drugs.  Allergies  Allergen Reactions  . Contrast Media [Iodinated Diagnostic Agents] Anaphylaxis    Wheezing and rash immediately after CT w/.  . Statins Palpitations  . Bactrim [Sulfamethoxazole-Trimethoprim] Itching    itching  . Penicillins Rash  . Trulicity [Dulaglutide] Nausea Only    Family History  Problem Relation Age of Onset  . Heart disease Father   . Hyperlipidemia Father   . Diabetes Father   . Hyperlipidemia Mother   .  Hypertension Mother   . Schizophrenia Mother   . Depression Mother   . Diabetes Mother   . Heart disease Other   . Cancer Maternal Aunt        breast  . Diabetes Maternal Grandmother   . Diabetes Maternal Grandfather   . Thyroid cancer Neg Hx       Prior to Admission medications   Medication Sig Start Date  End Date Taking? Authorizing Provider  amLODipine (NORVASC) 5 MG tablet Take 1 tablet (5 mg total) by mouth daily. 12/25/19  Yes Allegra Grana, FNP  aspirin EC 81 MG tablet Take 1 tablet (81 mg total) by mouth daily. 06/10/16  Yes Almond Lint, MD  azelastine (ASTELIN) 0.1 % nasal spray Place 1 spray into the nose 2 (two) times daily as needed for rhinitis or allergies.   Yes [provider]  carvedilol (COREG) 3.125 MG tablet TAKE 1 TABLET BY MOUTH TWICE DAILY WITH A MEAL Patient taking differently: Take 3.125 mg by mouth 2 (two) times daily with a meal. TAKE 1 TABLET BY MOUTH TWICE DAILY WITH A MEAL 06/05/19  Yes Allegra Grana, FNP  empagliflozin (JARDIANCE) 25 MG TABS tablet Take 1 tablet (25 mg total) by mouth daily before breakfast. 01/15/20  Yes Arnett, Lyn Records, FNP  Insulin Glargine (BASAGLAR KWIKPEN) 100 UNIT/ML Inject 40 units under the skin every morning Patient taking differently: Inject 40 Units into the skin daily. 05/21/20  Yes Allegra Grana, FNP  metFORMIN (GLUCOPHAGE-XR) 500 MG 24 hr tablet Take 2 tablets (1,000 mg total) by mouth daily with breakfast. 03/25/20  Yes Sherlene Shams, MD  Multiple Vitamins-Minerals (MULTIVITAMIN WITH MINERALS) tablet Take 1 tablet by mouth daily.   Yes [provider]  omeprazole (PRILOSEC) 40 MG capsule Take 1 capsule (40 mg total) by mouth daily. 05/24/20  Yes Allegra Grana, FNP  quinapril (ACCUPRIL) 40 MG tablet Take 1 tablet (40 mg total) by mouth daily. 02/17/20  Yes Allegra Grana, FNP  rosuvastatin (CRESTOR) 20 MG tablet Take 1 tablet (20 mg total) by mouth every other day. 07/10/19  Yes Arnett, Lyn Records, FNP  vitamin B-12 (CYANOCOBALAMIN) 1000 MCG tablet Take 1,000 mcg by mouth daily.   Yes [provider]  liraglutide (VICTOZA) 18 MG/3ML SOPN Start 0.6 mg [place] qd x 1 week, then 1.2 mg Revloc qd thereafter 04/12/20   Allegra Grana, FNP    Physical Exam: Vitals:   06/19/20 0730 06/19/20 0930  06/19/20 1000 06/19/20 1030  BP: (!) 180/90 (!) 177/95 (!) 177/96 (!) 171/83  Pulse: (!) 106 (!) 109 (!) 110 (!) 114  Resp:   18   Temp:      TempSrc:      SpO2: 98% 96% 99% 96%  Weight:      Height:         Vitals:   06/19/20 0730 06/19/20 0930 06/19/20 1000 06/19/20 1030  BP: (!) 180/90 (!) 177/95 (!) 177/96 (!) 171/83  Pulse: (!) 106 (!) 109 (!) 110 (!) 114  Resp:   18   Temp:      TempSrc:      SpO2: 98% 96% 99% 96%  Weight:      Height:          Constitutional: Alert and oriented x 3 .  Appears uncomfortable.  Holding onto emesis bag.   HEENT:      Head: Normocephalic and atraumatic.         Eyes: PERLA, EOMI,  Conjunctivae are normal. Sclera is non-icteric.       Mouth/Throat: Mucous membranes are moist.       Neck: Supple with no signs of meningismus. Cardiovascular:  Tachycardic. No murmurs, gallops, or rubs. 2+ symmetrical distal pulses are present . No JVD. No LE edema Respiratory: Respiratory effort normal .Lungs sounds clear bilaterally. No wheezes, crackles, or rhonchi.  Gastrointestinal: Soft, non tender, and non distended with positive bowel sounds.  No area of focal tenderness Genitourinary: No CVA tenderness. Musculoskeletal: Nontender with normal range of motion in all extremities. No cyanosis, or erythema of extremities. Neurologic:  Face is symmetric. Moving all extremities. No gross focal neurologic deficits  Skin: Skin is warm, dry.  No rash or ulcers Psychiatric: Mood and affect are normal   Labs on Admission: I have personally reviewed following labs and imaging studies  CBC: Recent Labs  Lab 06/19/20 0516  WBC 16.4*  NEUTROABS 14.2*  HGB 16.0*  HCT 46.4*  MCV 87.9  PLT 294   Basic Metabolic Panel: Recent Labs  Lab 06/19/20 0516  NA 135  K 4.5  CL 101  CO2 19*  GLUCOSE 179*  BUN 15  CREATININE 0.72  CALCIUM 9.8   GFR: Estimated Creatinine Clearance: 112.4 mL/min (by C-G formula based on SCr of 0.72 mg/dL). Liver Function  Tests: Recent Labs  Lab 06/19/20 0516  AST 28  ALT 29  ALKPHOS 63  BILITOT 1.2  PROT 8.6*  ALBUMIN 5.0   Recent Labs  Lab 06/19/20 0516  LIPASE 27   No results for input(s): AMMONIA in the last 168 hours. Coagulation Profile: No results for input(s): INR, PROTIME in the last 168 hours. Cardiac Enzymes: No results for input(s): CKTOTAL, CKMB, CKMBINDEX, TROPONINI in the last 168 hours. BNP (last 3 results) No results for input(s): PROBNP in the last 8760 hours. HbA1C: No results for input(s): HGBA1C in the last 72 hours. CBG: No results for input(s): GLUCAP in the last 168 hours. Lipid Profile: No results for input(s): CHOL, HDL, LDLCALC, TRIG, CHOLHDL, LDLDIRECT in the last 72 hours. Thyroid Function Tests: No results for input(s): TSH, T4TOTAL, FREET4, T3FREE, THYROIDAB in the last 72 hours. Anemia Panel: No results for input(s): VITAMINB12, FOLATE, FERRITIN, TIBC, IRON, RETICCTPCT in the last 72 hours. Urine analysis:    Component Value Date/Time   COLORURINE YELLOW (A) 06/19/2020 0924   APPEARANCEUR CLEAR (A) 06/19/2020 0924   APPEARANCEUR Turbid 11/29/2011 2215   LABSPEC 1.028 06/19/2020 0924   LABSPEC 1.030 11/29/2011 2215   PHURINE 5.0 06/19/2020 0924   GLUCOSEU >=500 (A) 06/19/2020 0924   GLUCOSEU 50 mg/dL 99/83/3825 0539   HGBUR NEGATIVE 06/19/2020 0924   BILIRUBINUR NEGATIVE 06/19/2020 0924   BILIRUBINUR NEG 01/02/2016 0913   BILIRUBINUR Negative 11/29/2011 2215   KETONESUR 80 (A) 06/19/2020 0924   PROTEINUR NEGATIVE 06/19/2020 0924   UROBILINOGEN 0.2 01/02/2016 0913   NITRITE NEGATIVE 06/19/2020 0924   LEUKOCYTESUR NEGATIVE 06/19/2020 0924   LEUKOCYTESUR Negative 11/29/2011 2215    Radiological Exams on Admission: CT ABDOMEN PELVIS WO CONTRAST  Result Date: 06/19/2020 CLINICAL DATA:  Acute, nonlocalized abdominal pain, nausea, vomiting EXAM: CT ABDOMEN AND PELVIS WITHOUT CONTRAST TECHNIQUE: Multidetector CT imaging of the abdomen and pelvis was  performed following the standard protocol without IV contrast. COMPARISON:  None. FINDINGS: Lower chest: The visualized lung bases are clear bilaterally. Visualized heart and pericardium are unremarkable. Hepatobiliary: No focal liver abnormality is seen. Status post cholecystectomy. No biliary dilatation. Pancreas: Unremarkable Spleen: Unremarkable Adrenals/Urinary Tract: Adrenal  glands are unremarkable. Kidneys are normal, without renal calculi, focal lesion, or hydronephrosis. Bladder is unremarkable. Stomach/Bowel: The ileocecal valve appears enlarged and protrudes into the colonic lumen, best noted on coronal imaging. This is new since prior examination. There is no evidence of obstruction. This may relate to localized edema though an infiltrative mass could appear similarly. Mild cecal diverticulosis is also identified. No significant pericecal inflammatory change, however, is identified. The stomach, small bowel, and large bowel are otherwise unremarkable. Appendix normal. No free intraperitoneal gas or fluid. Vascular/Lymphatic: No significant vascular findings are present. No enlarged abdominal or pelvic lymph nodes. Reproductive: Uterus and bilateral adnexa are unremarkable. Other: Mild subcutaneous infiltration of the pannus is again identified possibly representing benign edema though inflammatory conditions, such as panniculitis, could appear in similar fashion. Musculoskeletal: Right hip pinning has been performed. No acute bone abnormality. No lytic or blastic bone lesions are identified. IMPRESSION: Enlargement of the ileocecal valve which protrudes into the cecal lumen. This may relate to local edema or infiltration and correlation with the patient's recent screening endoscopy is recommended. No evidence of obstruction at this time. Electronically Signed   By: Helyn NumbersAshesh  Parikh MD   On: 06/19/2020 07:13     Assessment/Plan Principal Problem:   Abdominal pain Active Problems:   Type 2 diabetes  mellitus without complication (HCC)   Tobacco abuse   Essential hypertension   Alcohol dependence (HCC)     Abdominal pain Unclear etiology Acute in onset but nonfocal with associated leukocytosis ??  Secondary to diabetic gastroparesis -we will keep patient n.p.o., IV fluid hydration, IV PPI and scheduled Reglan over the next 24 hours ??  DKA -patient has a history of insulin-dependent diabetes mellitus and has nonfocal abdominal pain with leukocytosis and anion gap metabolic acidosis.  Continue IV fluid hydration.  Repeat electrolytes and if patient has worsening metabolic acidosis we will start an insulin drip. Patient received a dose of ciprofloxacin and Flagyl in the ER, administer any further antibiotic therapy since she is afebrile and pain is nonfocal.  Imaging does not show any abscess or diverticulitis. We will consult GI for abnormal CT scan findings   Hypertension Blood pressure is uncontrolled Resume oral antihypertensive medications which include amlodipine, carvedilol and quinapril.    Nicotine dependence Smoking cessation has been discussed with patient in detail She declines a nicotine transdermal patch Patient has been counseled on the need to abstain from further nicotine use   Alcohol dependence Denies having symptoms of alcohol withdrawal when she does not drink We will place patient on CIWA protocol administer lorazepam for CIWA scores of 8 or greater   Diabetes mellitus On long-acting insulin as well as oral agents Hold oral agents and Lantus Aggressive IV fluid hydration Sliding scale coverage with Accu-Cheks every 4 hours   DVT prophylaxis: Lovenox Code Status: full code  Family Communication: Greater than 50% of time was spent discussing patient's condition and plan of care with her at the bedside.  All questions and concerns have been addressed.  She verbalizes understanding and agrees with the plan. Disposition Plan: Back to previous home  environment Consults called: Gastroenterology Status: Observation    Tamarion Haymond MD [facility]     06/19/2020, 12:01 PM

## 2020-06-19 NOTE — ED Notes (Signed)
Pharmacy tech at bedside 

## 2020-06-19 NOTE — Progress Notes (Addendum)
Repeat electrolytes shows worsening metabolic acidosis, serum bicarbonate is down to 14 from 19 and patient has an anion gap of 14 but is euglycemic. This may be related to her use of Jardiance. Patient to be started on D10 infusion since she is euglycemic to prevent development of hypoglycemia on insulin drip.  We will monitor serum bicarbonate levels and ketones closely. We will admit patient to stepdown and start her on an insulin drip for DKA. Orders placed

## 2020-06-19 NOTE — Progress Notes (Signed)
Inpatient Diabetes Program Recommendations  AACE/ADA: New Consensus Statement on Inpatient Glycemic Control (2015)  Target Ranges:  Prepandial:   less than 140 mg/dL      Peak postprandial:   less than 180 mg/dL (1-2 hours)      Critically ill patients:  140 - 180 mg/dL   Lab Results  Component Value Date   GLUCAP 129 (H) 01/14/2019   HGBA1C 7.4 (H) 01/10/2020    Review of Glycemic Control Results for Julia Brown, Julia Brown (MRN [id number]) as of 06/19/2020 14:49  Ref. Range 06/19/2020 12:16  Sodium Latest Ref Range: 135 - 145 mmol/L 136  Potassium Latest Ref Range: 3.5 - 5.1 mmol/L 4.3  Chloride Latest Ref Range: 98 - 111 mmol/L 107  CO2 Latest Ref Range: 22 - 32 mmol/L 15 (L)  Glucose Latest Ref Range: 70 - 99 mg/dL 964 (H)  BUN Latest Ref Range: 6 - 20 mg/dL 14  Creatinine Latest Ref Range: 0.44 - 1.00 mg/dL 3.83  Calcium Latest Ref Range: 8.9 - 10.3 mg/dL 8.8 (L)  Anion gap Latest Ref Range: 5 - 15  14   Diabetes history: DM2 Outpatient Diabetes medications:  Lantus 40 units qd + Jardiance 25 mg qd + Victoza 0.6 mg weekly + Metformin 1 gm bid  Current orders for Inpatient glycemic control: IV insulin  Inpatient Diabetes Program Recommendations:   Received consult regarding insulin management for patient regarding CO2 now 15.  Discussed with Dr. Joylene Igo and pharmacist Dorothea Ogle regarding adding D10 until acidosis clears.  Thank you, Julia Brown. Iyani Dresner, RN, MSN, CDE  Diabetes Coordinator Inpatient Glycemic Control Team Team Pager [phone] (8am-5pm) 06/19/2020 3:05 PM

## 2020-06-19 NOTE — ED Notes (Signed)
Pt returned from CT dept via stretcher

## 2020-06-19 NOTE — ED Provider Notes (Signed)
[facility] Emergency Department Provider Note  ____________________________________________   Event Date/Time   First MD Initiated Contact with Patient 06/19/20 0500     (approximate)  I have reviewed the triage vital signs and the nursing notes.   HISTORY  Chief Complaint Abdominal Pain    HPI Julia Brown is a 50 y.o. female with history of hypertension, hyperlipidemia, diabetes, GERD who presents to the emergency department with diffuse, sharp, severe abdominal pain with nausea and vomiting that started tonight after eating popcorn.  She states this feels similar to her previous episodes of gastritis.  This is her fourth visit to the emergency department in the past several years for the same.  She has not followed up with a gastroenterologist.  She has had previous history of cholecystectomy and C-section.  Denies fevers, chest pain or shortness of breath, diarrhea, bloody stools or melena, dysuria or hematuria, vaginal bleeding or discharge.  Took Zofran at home without any relief.  She states the nausea is bothering her more than pain.        Past Medical History:  Diagnosis Date  . Chicken pox   . Diabetes mellitus   . GERD (gastroesophageal reflux disease)   . Hyperlipidemia   . Hypertension   . UTI (lower urinary tract infection)     Patient Active Problem List   Diagnosis Date Noted  . Snores 03/08/2019  . Abscess or cellulitis of foot 01/13/2019  . Hepatic steatosis 09/14/2018  . Chronic low back pain with right-sided sciatica 12/13/2017  . Vaginal itching 12/13/2017  . Palpitations 05/20/2016  . Anxiety state 01/14/2016  . Vitamin D deficiency 05/07/2015  . Essential hypertension 01/31/2015  . Vulvovaginal candidiasis 11/01/2014  . Muscle spasm 04/26/2014  . Routine general medical examination at a health care facility 08/28/2013  . Adjustment disorder with mixed anxiety and depressed mood 08/28/2013  . Tobacco abuse  08/28/2013  . Obesity (BMI 30-39.9) 08/28/2013  . GERD (gastroesophageal reflux disease) 08/12/2012  . Hair loss 08/12/2012  . Hyperlipidemia 11/10/2010  . Diabetes type 2, controlled (HCC) 11/10/2010    Past Surgical History:  Procedure Laterality Date  . ABLATION  2007   excessive bleeding  . CESAREAN SECTION  1994  . CHOLECYSTECTOMY  1993  . ORIF FEMUR FRACTURE [place]    [place]  . TUBAL LIGATION    . uterine ablation      Prior to Admission medications   Medication Sig Start Date End Date Taking? Authorizing Provider  ACCU-CHEK FASTCLIX LANCETS MISC  01/19/17   [provider]  ACCU-CHEK GUIDE test strip  01/19/17   [provider]  amLODipine (NORVASC) 5 MG tablet Take 1 tablet (5 mg total) by mouth daily. 12/25/19   Allegra Grana, FNP  aspirin EC 81 MG tablet Take 1 tablet (81 mg total) by mouth daily. 06/10/16   Almond Lint, MD  azelastine (ASTELIN) 0.1 % nasal spray Place 1 spray into the nose in the morning and at bedtime. 10/13/19   [provider]  carvedilol (COREG) 3.125 MG tablet TAKE 1 TABLET BY MOUTH TWICE DAILY WITH A MEAL 06/05/19   Allegra Grana, FNP  empagliflozin (JARDIANCE) 25 MG TABS tablet Take 1 tablet (25 mg total) by mouth daily before breakfast. 01/15/20   Allegra Grana, FNP  Insulin Glargine [facility] KWIKPEN) 100 UNIT/ML Inject 40 units under the skin every morning 05/21/20   Allegra Grana, FNP  Insulin Pen Needle (BD PEN NEEDLE NANO  U/F) 32G X 4 MM MISC See admin instructions. 07/26/18   [provider]  liraglutide (VICTOZA) 18 MG/3ML SOPN Start 0.6 mg [place] qd x 1 week, then 1.2 mg [employer] qd thereafter 04/12/20   Allegra Grana, FNP  metFORMIN (GLUCOPHAGE-XR) 500 MG 24 hr tablet Take 2 tablets (1,000 mg total) by mouth daily with breakfast. 03/25/20   Sherlene Shams, MD  omeprazole (PRILOSEC) 40 MG capsule Take 1 capsule (40 mg total) by mouth daily. 05/24/20   Allegra Grana, FNP  omeprazole (PRILOSEC)  40 MG capsule Take 1 capsule (40 mg total) by mouth daily. 05/24/20   Allegra Grana, FNP  Prenatal Vit-Fe Fumarate-FA (PRENATAL VITAMIN PO) Take by mouth.    [provider]  quinapril (ACCUPRIL) 40 MG tablet Take 1 tablet (40 mg total) by mouth daily. 02/17/20   Allegra Grana, FNP  rosuvastatin (CRESTOR) 20 MG tablet Take 1 tablet (20 mg total) by mouth every other day. 07/10/19   Allegra Grana, FNP  vitamin B-12 (CYANOCOBALAMIN) 1000 MCG tablet Take 1,000 mcg by mouth daily.    [provider]    Allergies Contrast media [iodinated diagnostic agents], Statins, Bactrim [sulfamethoxazole-trimethoprim], Penicillins, and Trulicity [dulaglutide]  Family History  Problem Relation Age of Onset  . Heart disease Father   . Hyperlipidemia Father   . Diabetes Father   . Hyperlipidemia Mother   . Hypertension Mother   . Schizophrenia Mother   . Depression Mother   . Diabetes Mother   . Heart disease Other   . Cancer Maternal Aunt        breast  . Diabetes Maternal Grandmother   . Diabetes Maternal Grandfather   . Thyroid cancer Neg Hx     Social History Social History   Tobacco Use  . Smoking status: Current Every Day Smoker    Packs/day: 0.50    Years: 10.00    Pack years: 5.00    Types: Cigarettes  . Smokeless tobacco: Never Used  . Tobacco comment: smoked < 1 pack a day  Vaping Use  . Vaping Use: Never used  Substance Use Topics  . Alcohol use: Yes    Alcohol/week: 0.0 standard drinks    Comment: weekend 3-4drinks  . Drug use: No    Review of Systems Constitutional: No fever. Eyes: No visual changes. ENT: No sore throat. Cardiovascular: Denies chest pain. Respiratory: Denies shortness of breath. Gastrointestinal: + Nausea and vomiting.  No diarrhea. Genitourinary: Negative for dysuria. Musculoskeletal: Negative for back pain. Skin: Negative for rash. Neurological: Negative for focal weakness or  numbness.  ____________________________________________   PHYSICAL EXAM:  VITAL SIGNS: ED Triage Vitals  Enc Vitals Group     BP 06/19/20 0459 (!) 155/95     Pulse Rate 06/19/20 0459 (!) 106     Resp 06/19/20 0459 (!) 22     Temp 06/19/20 0459 98.5 F (36.9 C)     Temp Source 06/19/20 0459 Oral     SpO2 06/19/20 0459 100 %     Weight 06/19/20 0450 235 lb (106.6 kg)     Height 06/19/20 0450 5\' 10"  (1.778 m)     Head Circumference --      Peak Flow --      Pain Score 06/19/20 0450 8     Pain Loc --      Pain Edu? --      Excl. in GC? --    CONSTITUTIONAL: Alert and oriented and responds appropriately to  questions.  Actively dry heaving.  Appears uncomfortable. HEAD: Normocephalic EYES: Conjunctivae clear, pupils appear equal, EOM appear intact ENT: normal nose; moist mucous membranes NECK: Supple, normal ROM CARD: Regular and tachycardic; S1 and S2 appreciated; no murmurs, no clicks, no rubs, no gallops RESP: Normal chest excursion without splinting or tachypnea; breath sounds clear and equal bilaterally; no wheezes, no rhonchi, no rales, no hypoxia or respiratory distress, speaking full sentences ABD/GI: Normal bowel sounds; non-distended; soft, non-tender, no rebound, no guarding, no peritoneal signs, no hepatosplenomegaly BACK: The back appears normal EXT: Normal ROM in all joints; no deformity noted, no edema; no cyanosis SKIN: Normal color for age and race; warm; no rash on exposed skin NEURO: Moves all extremities equally PSYCH: The patient's mood and manner are appropriate.  ____________________________________________   LABS (all labs ordered are listed, but only abnormal results are displayed)  Labs Reviewed  CBC WITH DIFFERENTIAL/PLATELET - Abnormal; Notable for the following components:      Result Value   WBC 16.4 (*)    RBC 5.28 (*)    Hemoglobin 16.0 (*)    HCT 46.4 (*)    Neutro Abs 14.2 (*)    Abs Immature Granulocytes 0.13 (*)    All other  components within normal limits  COMPREHENSIVE METABOLIC PANEL - Abnormal; Notable for the following components:   CO2 19 (*)    Glucose, Bld 179 (*)    Total Protein 8.6 (*)    All other components within normal limits  LIPASE, BLOOD  URINALYSIS, ROUTINE W REFLEX MICROSCOPIC  URINE DRUG SCREEN, QUALITATIVE ([facility] ONLY)   ____________________________________________  EKG  none ____________________________________________  RADIOLOGY I, [place]ha Behlke, personally viewed and evaluated these images (plain radiographs) as part of my medical decision making, as well as reviewing the written report by the radiologist.  ED MD interpretation:  none  Official radiology report(s): No results found.  ____________________________________________   PROCEDURES  Procedure(s) performed (including Critical Care):  Procedures   ____________________________________________   INITIAL IMPRESSION / ASSESSMENT AND PLAN / ED COURSE  As part of my medical decision making, I reviewed the following data within the electronic MEDICAL RECORD NUMBER Nursing notes reviewed and incorporated, Labs reviewed , Old chart reviewed, Notes from prior ED visits and [place] Controlled Substance Database         Patient here with diffuse abdominal pain, nausea and vomiting after eating popcorn last night.  Her abdominal exam is benign and nontender to palpation.  She has had gastritis before and similar presentations.  It appears her symptoms previously have improved with Haldol and also with Reglan.  Her last CT scan of her abdomen pelvis was in November 2020 and showed no acute abnormality at that time when she had similar symptoms.  I do not feel repeat imaging is indicated currently but will check labs, urine.  Suspect recurrence of her gastritis versus gastroparesis.  I have recommended gastroenterology follow-up if she is discharged today.  Low suspicion for colitis, diverticulitis, perforation, bowel obstruction,  appendicitis based on exam.  Will give IV fluids, Protonix, Reglan, Benadryl and reassess.  ED PROGRESS  6:05 AM  Patient's labs show leukocytosis of 16,000 with left shift.  Given her leukocytosis and pain that she still rates an 8/10, will obtain Noncon CT of abdomen pelvis.  She reports allergy to contrast media.  Will give IV morphine, Phenergan for continued symptomatic relief.  7:05 AM  CTAP and urine pending.  Signed out to oncoming EDP Dr. Lenard Lance.  I  reviewed all nursing notes and pertinent previous records as available.  I have reviewed and interpreted any EKGs, lab and urine results, imaging (as available).  ____________________________________________   FINAL CLINICAL IMPRESSION(S) / ED DIAGNOSES  Final diagnoses:  Nausea and vomiting in adult     ED Discharge Orders    None      *Please note:  Julia Brown was evaluated in Emergency Department on 06/19/2020 for the symptoms described in the history of present illness. She was evaluated in the context of the global COVID-19 pandemic, which necessitated consideration that the patient might be at risk for infection with the SARS-CoV-2 virus that causes COVID-19. Institutional protocols and algorithms that pertain to the evaluation of patients at risk for COVID-19 are in a state of rapid change based on information released by regulatory bodies including the CDC and federal and state organizations. These policies and algorithms were followed during the patient's care in the ED.  Some ED evaluations and interventions may be delayed as a result of limited staffing during and the pandemic.*   Note:  This document was prepared using Dragon voice recognition software and may include unintentional dictation errors.   Day Deery, Layla MawKristen N, DO 06/19/20 [phone]

## 2020-06-19 NOTE — ED Notes (Signed)
Pt noted to be sitting at edge of bed holding emesis bag -- moaning and facial grimacing noted.  No active vomiting however pt observed to be dry heaving.  Pt reports to provider h/o gastritis -- states in addition to eating popcorn prior to symptom onset she also had a hamburger.  C/O 9/10 generalized abd pain with associated n/v prior to arrival - denies diarrhea/fevers or genitourinary complaints.   Reglan/Benadryl/Protonix since administered and 1L bolus now infusing via gravity to 20G L AC.  Pt denies any immediate needs, questions, concerns at this time - updated on urine specimen needed with next void.

## 2020-06-20 ENCOUNTER — Encounter: Payer: Self-pay | Admitting: Internal Medicine

## 2020-06-20 DIAGNOSIS — I10 Essential (primary) hypertension: Secondary | ICD-10-CM

## 2020-06-20 DIAGNOSIS — E1110 Type 2 diabetes mellitus with ketoacidosis without coma: Principal | ICD-10-CM

## 2020-06-20 DIAGNOSIS — R1084 Generalized abdominal pain: Secondary | ICD-10-CM

## 2020-06-20 LAB — BASIC METABOLIC PANEL
Anion gap: 10 (ref 5–15)
Anion gap: 11 (ref 5–15)
Anion gap: 12 (ref 5–15)
BUN: 14 mg/dL (ref 6–20)
BUN: 13 mg/dL (ref 6–20)
BUN: 11 mg/dL (ref 6–20)
CO2: 19 mmol/L — ABNORMAL LOW (ref 22–32)
CO2: 19 mmol/L — ABNORMAL LOW (ref 22–32)
CO2: 21 mmol/L — ABNORMAL LOW (ref 22–32)
Calcium: 9.1 mg/dL (ref 8.9–10.3)
Calcium: 9.2 mg/dL (ref 8.9–10.3)
Calcium: 9.2 mg/dL (ref 8.9–10.3)
Chloride: 107 mmol/L (ref 98–111)
Chloride: 106 mmol/L (ref 98–111)
Chloride: 101 mmol/L (ref 98–111)
Creatinine, Ser: 0.66 mg/dL (ref 0.44–1.00)
Creatinine, Ser: 0.75 mg/dL (ref 0.44–1.00)
Creatinine, Ser: 0.6 mg/dL (ref 0.44–1.00)
GFR, Estimated: >60 mL/min (ref >60)
GFR, Estimated: >60 mL/min (ref >60)
GFR, Estimated: >60 mL/min (ref >60)
Glucose, Bld: 153 mg/dL — ABNORMAL HIGH (ref 70–99)
Glucose, Bld: 160 mg/dL — ABNORMAL HIGH (ref 70–99)
Glucose, Bld: 171 mg/dL — ABNORMAL HIGH (ref 70–99)
Potassium: 4.5 mmol/L (ref 3.5–5.1)
Potassium: 4.2 mmol/L (ref 3.5–5.1)
Potassium: 4.1 mmol/L (ref 3.5–5.1)
Sodium: 136 mmol/L (ref 135–145)
Sodium: 136 mmol/L (ref 135–145)
Sodium: 134 mmol/L — ABNORMAL LOW (ref 135–145)

## 2020-06-20 LAB — BETA-HYDROXYBUTYRIC ACID
Beta-Hydroxybutyric Acid: 2.06 mmol/L — ABNORMAL HIGH (ref 0.05–0.27)
Beta-Hydroxybutyric Acid: 1.37 mmol/L — ABNORMAL HIGH (ref 0.05–0.27)

## 2020-06-20 LAB — GLUCOSE, CAPILLARY
Glucose-Capillary: 134 mg/dL — ABNORMAL HIGH (ref 70–99)
Glucose-Capillary: 137 mg/dL — ABNORMAL HIGH (ref 70–99)
Glucose-Capillary: 138 mg/dL — ABNORMAL HIGH (ref 70–99)
Glucose-Capillary: 141 mg/dL — ABNORMAL HIGH (ref 70–99)
Glucose-Capillary: 147 mg/dL — ABNORMAL HIGH (ref 70–99)
Glucose-Capillary: 149 mg/dL — ABNORMAL HIGH (ref 70–99)
Glucose-Capillary: 150 mg/dL — ABNORMAL HIGH (ref 70–99)
Glucose-Capillary: 159 mg/dL — ABNORMAL HIGH (ref 70–99)
Glucose-Capillary: 161 mg/dL — ABNORMAL HIGH (ref 70–99)
Glucose-Capillary: 162 mg/dL — ABNORMAL HIGH (ref 70–99)
Glucose-Capillary: 163 mg/dL — ABNORMAL HIGH (ref 70–99)
Glucose-Capillary: 164 mg/dL — ABNORMAL HIGH (ref 70–99)
Glucose-Capillary: 167 mg/dL — ABNORMAL HIGH (ref 70–99)
Glucose-Capillary: 170 mg/dL — ABNORMAL HIGH (ref 70–99)
Glucose-Capillary: 170 mg/dL — ABNORMAL HIGH (ref 70–99)
Glucose-Capillary: 180 mg/dL — ABNORMAL HIGH (ref 70–99)
Glucose-Capillary: 180 mg/dL — ABNORMAL HIGH (ref 70–99)
Glucose-Capillary: 184 mg/dL — ABNORMAL HIGH (ref 70–99)
Glucose-Capillary: 200 mg/dL — ABNORMAL HIGH (ref 70–99)
Glucose-Capillary: 204 mg/dL — ABNORMAL HIGH (ref 70–99)
Glucose-Capillary: 210 mg/dL — ABNORMAL HIGH (ref 70–99)

## 2020-06-20 LAB — CBC
HCT: 43.6 % (ref 36.0–46.0)
Hemoglobin: 14.5 g/dL (ref 12.0–15.0)
MCH: 30 pg (ref 26.0–34.0)
MCHC: 33.3 g/dL (ref 30.0–36.0)
MCV: 90.3 fL (ref 80.0–100.0)
Platelets: 252 10*3/uL (ref 150–400)
RBC: 4.83 MIL/uL (ref 3.87–5.11)
RDW: 12.6 % (ref 11.5–15.5)
WBC: 15.8 10*3/uL — ABNORMAL HIGH (ref 4.0–10.5)
nRBC: 0 % (ref 0.0–0.2)

## 2020-06-20 LAB — HM DIABETES EYE EXAM

## 2020-06-20 LAB — HEMOGLOBIN A1C
Hgb A1c MFr Bld: 7.1 % — ABNORMAL HIGH (ref 4.8–5.6)
Mean Plasma Glucose: 157.07 mg/dL

## 2020-06-20 MED ORDER — ENOXAPARIN SODIUM 60 MG/0.6ML ~~LOC~~ SOLN
0.5000 mg/kg | SUBCUTANEOUS | Status: DC
  Administered 2020-06-21 – 2020-06-23 (×3): 52.5 mg via SUBCUTANEOUS
  Filled 2020-06-20 (×3): qty 0.6

## 2020-06-20 MED ORDER — HYDRALAZINE HCL 20 MG/ML IJ SOLN: 10.0000 mg | Freq: Four times a day (QID) | INTRAMUSCULAR | Status: DC | PRN

## 2020-06-20 MED ORDER — DEXTROSE 10 % IV SOLN: INTRAVENOUS | Status: AC

## 2020-06-20 MED ORDER — SODIUM CHLORIDE 0.9 % IV SOLN
12.5000 mg | Freq: Four times a day (QID) | INTRAVENOUS | Status: DC | PRN
  Administered 2020-06-20 – 2020-06-21 (×5): 12.5 mg via INTRAVENOUS
  Filled 2020-06-20 (×8): qty 0.5

## 2020-06-20 MED ORDER — DEXTROSE 10 % IV SOLN: INTRAVENOUS | Status: DC

## 2020-06-20 NOTE — Progress Notes (Addendum)
Inpatient Diabetes Program Recommendations  AACE/ADA: New Consensus Statement on Inpatient Glycemic Control (2015)  Target Ranges:  Prepandial:   less than 140 mg/dL      Peak postprandial:   less than 180 mg/dL (1-2 hours)      Critically ill patients:  140 - 180 mg/dL   Lab Results  Component Value Date   GLUCAP 161 (H) 06/20/2020   HGBA1C 7.1 (H) 06/19/2020    Review of Glycemic Control Results for Julia Brown, Julia Brown (MRN [id number]) as of 06/20/2020 08:44  Ref. Range 06/20/2020 05:56  Sodium Latest Ref Range: 135 - 145 mmol/L 136  Potassium Latest Ref Range: 3.5 - 5.1 mmol/L 4.2  Chloride Latest Ref Range: 98 - 111 mmol/L 106  CO2 Latest Ref Range: 22 - 32 mmol/L 19 (L)  Glucose Latest Ref Range: 70 - 99 mg/dL 597 (H)  BUN Latest Ref Range: 6 - 20 mg/dL 13  Creatinine Latest Ref Range: 0.44 - 1.00 mg/dL 4.16  Calcium Latest Ref Range: 8.9 - 10.3 mg/dL 9.2  Anion gap Latest Ref Range: 5 - 15  11   Diabetes history: DM2 Outpatient Diabetes medications:  Lantus 40 units qd + Jardiance 25 mg qd + Victoza 0.6 mg weekly + Metformin 1 gm bid  Current orders for Inpatient glycemic control: IV insulin  Inpatient Diabetes Program Recommendations:   Labs much improved with CO2 19 + anion gap 11. When ready to transition off of IV insulin: -Give basal insulin 2 hrs. Prior to IV insulin discontinued and cover CBG with Novolog correction scale when IV insulin discontinued. Consider Lantus 10 units bid and adjust as needed -Novolog 0-9 units q 4 hrs. While NPO  Spoke with patient @ bedside and patient states no changes in nutrition, water intake, or medications over the past few weeks.  Thank you, Billy Fischer. Leola Fiore, RN, MSN, CDE  Diabetes Coordinator Inpatient Glycemic Control Team Team Pager [phone] (8am-5pm) 06/20/2020 9:49 AM

## 2020-06-20 NOTE — Progress Notes (Addendum)
Initial Nutrition Assessment  DOCUMENTATION CODES:   Obesity unspecified  INTERVENTION:   RD will add supplements once diet advanced  RD will provide diabetes diet education prior to discharge  NUTRITION DIAGNOSIS:   Inadequate oral intake related to acute illness as evidenced by NPO status.  GOAL:   Patient will meet greater than or equal to 90% of their needs  MONITOR:   Supplement acceptance,PO intake,Labs,Weight trends,Skin,I & O's  REASON FOR ASSESSMENT:   Consult Diet education  ASSESSMENT:   50 y.o. female with medical history significant for IDDM on Lantus and Jardiance, GERD, gastritis, hypertension, dyslipidemia, obesity, tobacco use disorder and alcohol use disorder who is admitted with DKA   Attempted to see patient twice today to provide diabetes diet education. Pt with nausea and vomiting this morning and sleeping this afternoon after receiving phenergan. Pt remains NPO in hospital. Suspect pt with poor appetite and oral intake for one day pta r/t nausea and vomiting. RD will monitor for diet advancement and add supplements if needed. RD will attempt to provide pt with diabetes diet education prior to discharge. Per chart, pt appears fairly weight stable at baseline. Pt is likely at refeed risk.   Medications reviewed and include: aspirin, lovenox, folic acid, MVI, protonix, thiamine, 10% dextrose @100ml /hr, insulin, phenergan   Labs reviewed: wbc- 15.8(H) cbgs- 161, 149, 180, 170, 167 x 24 hrs AIC 7.1(H)- 3/30  NUTRITION - FOCUSED PHYSICAL EXAM:  Flowsheet Row Most Recent Value  Orbital Region No depletion  Upper Arm Region No depletion  Thoracic and Lumbar Region No depletion  Buccal Region No depletion  Temple Region No depletion  Clavicle Bone Region No depletion  Clavicle and Acromion Bone Region No depletion  Scapular Bone Region No depletion  Dorsal Hand No depletion  Patellar Region No depletion  Anterior Thigh Region No depletion   Posterior Calf Region No depletion  Edema (RD Assessment) None  Hair Reviewed  Eyes Reviewed  Mouth Reviewed  Skin Reviewed  Nails Reviewed     Diet Order:   Diet Order            Diet NPO time specified  Diet effective now                EDUCATION NEEDS:   Not appropriate for education at this time  Skin:  Skin Assessment: Reviewed RN Assessment  Last BM:  3/29  Height:   Ht Readings from Last 1 Encounters:  06/19/20 5\' 10"  (1.778 m)    Weight:   Wt Readings from Last 1 Encounters:  06/19/20 106.6 kg    Ideal Body Weight:  68.18 kg  BMI:  Body mass index is 33.72 kg/m.  Estimated Nutritional Needs:   Kcal:  2100-2400kcal/day  Protein:  110-120g/day  Fluid:  2.1-2.4L/day  MS, RD, LDN Please refer to [facility] for RD and/or RD on-call/weekend/after hours pager

## 2020-06-20 NOTE — Progress Notes (Signed)
Pt remains on Insulin gtt overnight. Serial BMP's completed overnight, see results. BG stable overnight on insulin and D5 in LR. Endotool protocol in place. Pt intermittently nauseas overnight, given scheduled Reglan and PRN Zofran overnight. Purewick in place, pt voiding adequately overnight. Pt in no acute distress @ this time. Will continue to monitor.

## 2020-06-20 NOTE — Progress Notes (Signed)
PHARMACIST - PHYSICIAN COMMUNICATION  CONCERNING:  Enoxaparin (Lovenox) for DVT Prophylaxis    RECOMMENDATION: Patient was prescribed enoxaparin 40mg  q24 hours for VTE prophylaxis.   Filed Weights   06/19/20 0450  Weight: 106.6 kg (235 lb)    Body mass index is 33.72 kg/m.  Estimated Creatinine Clearance: 112.4 mL/min (by C-G formula based on SCr of 0.75 mg/dL).   Based on [facility] policy patient is candidate for enoxaparin 0.5mg /kg TBW SQ every 24 hours based on BMI being >30.  DESCRIPTION: Pharmacy has adjusted enoxaparin dose per [facility] policy.  Patient is now receiving enoxaparin 52.5 mg every 24 hours   [facility] 06/20/2020 2:48 PM

## 2020-06-20 NOTE — Progress Notes (Signed)
Progress Note    Julia Brown  WGN:[id number] DOB: [birth year]  DOA: 06/19/2020 PCP: Allegra Grana, FNP      Brief Narrative:    Medical records reviewed and are as summarized below:  Julia Brown is a 50 y.o. female with medical history significant for IDDM on Lantus and Jardiance, GERD, gastritis, hypertension, dyslipidemia, obesity, tobacco use disorder, alcohol use disorder.  She presented to the hospital with nausea, vomiting and abdominal pain.  She was admitted to the hospital for euglycemic DKA.  She was treated with IV fluids and IV insulin infusion.    Assessment/Plan:   Principal Problem:   Abdominal pain Active Problems:   DKA, type 2, not at goal [facility])   Tobacco abuse   Essential hypertension   Alcohol dependence (HCC)   DKA (diabetic ketoacidosis) (HCC)    Body mass index is 33.72 kg/m.  (Obesity)  Insulin-dependent diabetes mellitus with euglycemic DKA: Continue IV insulin infusion and dextrose infusion.  Monitor glucose levels closely per protocol.  Monitor BMP.  Nausea: Antiemetics as needed.  Vomiting and abdominal pain have resolved.  Follow-up with gastroenterologist.  Hypertension: Continue antihypertensives  Tobacco and alcohol use disorder: Patient has been counseled to quit decreasing alcohol cigarettes.  Ativan as needed per CIWA protocol.  Diet Order            Diet NPO time specified  Diet effective now                    Consultants:  Gastroenterologist  Procedures:  None    Medications:   . amLODipine  5 mg Oral Daily  . aspirin EC  81 mg Oral Daily  . carvedilol  3.125 mg Oral BID WC  . Chlorhexidine Gluconate Cloth  6 each Topical Daily  . enoxaparin (LOVENOX) injection  40 mg Subcutaneous Q24H  . folic acid  1 mg Oral Daily  . multivitamin with minerals  1 tablet Oral Daily  . pantoprazole (PROTONIX) IV  40 mg Intravenous Q24H  . quinapril  40 mg Oral Daily  . rosuvastatin  20 mg Oral QODAY   . thiamine  100 mg Oral Daily   Or  . thiamine  100 mg Intravenous Daily   Continuous Infusions: . dextrose 100 mL/hr at 06/20/20 0811  . insulin 2.2 mL/hr at 06/20/20 0700     Anti-infectives (From admission, onward)   Start     Dose/Rate Route Frequency Ordered Stop   06/19/20 1030  ciprofloxacin (CIPRO) IVPB 400 mg        400 mg 200 mL/hr over 60 Minutes Intravenous  Once 06/19/20 1015 06/19/20 1137   06/19/20 1030  metroNIDAZOLE (FLAGYL) IVPB 500 mg        500 mg 100 mL/hr over 60 Minutes Intravenous  Once 06/19/20 1015 06/19/20 1147             Family Communication/Anticipated D/C date and plan/Code Status   DVT prophylaxis: enoxaparin (LOVENOX) injection 40 mg Start: 06/19/20 1200     Code Status: Full Code  Family Communication: None Disposition Plan:    Status is: Inpatient  Remains inpatient appropriate because:IV treatments appropriate due to intensity of illness or inability to take PO and Inpatient level of care appropriate due to severity of illness   Dispo: The patient is from: Home              Anticipated d/c is to: Home  Patient currently is not medically stable to d/c.   Difficult to place patient No           Subjective:   C/o nausea.  Abdominal pain is better.  No diarrhea or constipation.  Objective:    Vitals:   06/20/20 0500 06/20/20 0600 06/20/20 0700 06/20/20 0800  BP: (!) 160/104 (!) 167/97 (!) 162/95 (!) 162/94  Pulse: (!) 102 (!) 104 (!) 101 99  Resp: 13 15 13 14   Temp:      TempSrc:      SpO2: 99% 97% 99% 96%  Weight:      Height:       No data found.   Intake/Output Summary (Last 24 hours) at 06/20/2020 0957 Last data filed at 06/20/2020 0700 Gross per 24 hour  Intake 3378.36 ml  Output 2700 ml  Net 678.36 ml   Filed Weights   06/19/20 0450  Weight: 106.6 kg    Exam:  GEN: NAD SKIN: No rash EYES: EOMI ENT: MMM CV: RRR PULM: CTA B ABD: soft, obese, NT, +BS CNS: AAO x 3, non  focal EXT: No edema or tenderness        Data Reviewed:   I have personally reviewed following labs and imaging studies:  Labs: Labs show the following:   Results for Julia, Brown (MRN [name]) as of 06/20/2020 14:10  Ref. Range 06/19/2020 16:43 06/19/2020 19:09 06/19/2020 21:50 06/20/2020 01:36 06/20/2020 05:56  Beta-Hydroxybutyric Acid Latest Ref Range: 0.05 - 0.27 mmol/L 4.78 (H)  3.56 (H)  2.06 (H)    Basic Metabolic Panel: Recent Labs  Lab 06/19/20 1643 06/19/20 1909 06/19/20 2150 06/20/20 0136 06/20/20 0556  NA 136 137 135 136 136  K 4.6 4.8 4.6 4.5 4.2  CL 107 107 106 107 106  CO2 14* 16* 17* 19* 19*  GLUCOSE 132* 137* 147* 153* 160*  BUN 14 14 15 14 13   CREATININE 0.80 0.79 0.78 0.66 0.75  CALCIUM 9.2 9.1 9.0 9.1 9.2  MG 2.4  --   --   --   --   PHOS 4.5  --   --   --   --    GFR Estimated Creatinine Clearance: 112.4 mL/min (by C-G formula based on SCr of 0.75 mg/dL). Liver Function Tests: Recent Labs  Lab 06/19/20 0516  AST 28  ALT 29  ALKPHOS 63  BILITOT 1.2  PROT 8.6*  ALBUMIN 5.0   Recent Labs  Lab 06/19/20 0516  LIPASE 27   No results for input(s): AMMONIA in the last 168 hours. Coagulation profile No results for input(s): INR, PROTIME in the last 168 hours.  CBC: Recent Labs  Lab 06/19/20 0516 06/20/20 0556  WBC 16.4* 15.8*  NEUTROABS 14.2*  --   HGB 16.0* 14.5  HCT 46.4* 43.6  MCV 87.9 90.3  PLT 294 252   Cardiac Enzymes: No results for input(s): CKTOTAL, CKMB, CKMBINDEX, TROPONINI in the last 168 hours. BNP (last 3 results) No results for input(s): PROBNP in the last 8760 hours. CBG: Recent Labs  Lab 06/20/20 0429 06/20/20 0536 06/20/20 0625 06/20/20 0730 06/20/20 0825  GLUCAP 134* 159* 163* 141* 161*   D-Dimer: No results for input(s): DDIMER in the last 72 hours. Hgb A1c: Recent Labs    06/19/20 1216 06/19/20 1643  HGBA1C 7.2* 7.1*   Lipid Profile: No results for input(s): CHOL, HDL, LDLCALC, TRIG,  CHOLHDL, LDLDIRECT in the last 72 hours. Thyroid function studies: No results for input(s): TSH, T4TOTAL, T3FREE, THYROIDAB  in the last 72 hours.  Invalid input(s): FREET3 Anemia work up: No results for input(s): VITAMINB12, FOLATE, FERRITIN, TIBC, IRON, RETICCTPCT in the last 72 hours. Sepsis Labs: Recent Labs  Lab 06/19/20 0516 06/20/20 0556  WBC 16.4* 15.8*    Microbiology Recent Results (from the past 240 hour(s))  Resp Panel by RT-PCR (Flu A&B, Covid) Nasopharyngeal Swab     Status: None   Collection Time: 06/19/20 10:48 AM   Specimen: Nasopharyngeal Swab; Nasopharyngeal(NP) swabs in vial transport medium  Result Value Ref Range Status   SARS Coronavirus 2 by RT PCR NEGATIVE NEGATIVE Final    Comment: (NOTE) SARS-CoV-2 target nucleic acids are NOT DETECTED.  The SARS-CoV-2 RNA is generally detectable in upper respiratory specimens during the acute phase of infection. The lowest concentration of SARS-CoV-2 viral copies this assay can detect is 138 copies/mL. A negative result does not preclude SARS-Cov-2 infection and should not be used as the sole basis for treatment or other patient management decisions. A negative result may occur with  improper specimen collection/handling, submission of specimen other than nasopharyngeal swab, presence of viral mutation(s) within the areas targeted by this assay, and inadequate number of viral copies(<138 copies/mL). A negative result must be combined with clinical observations, patient history, and epidemiological information. The expected result is Negative.  Fact Sheet for Patients:  BloggerCourse.comhttps://www.fda.gov/media/152166/download  Fact Sheet for Healthcare Providers:  SeriousBroker.ithttps://www.fda.gov/media/152162/download  This test is no t yet approved or cleared by the [place] FDA and  has been authorized for detection and/or diagnosis of SARS-CoV-2 by FDA under an Emergency Use Authorization (EUA). This EUA will remain  in effect  (meaning this test can be used) for the duration of the COVID-19 declaration under Section 564(b)(1) of the Act, 21 U.S.C.section 360bbb-3(b)(1), unless the authorization is terminated  or revoked sooner.       Influenza A by PCR NEGATIVE NEGATIVE Final   Influenza B by PCR NEGATIVE NEGATIVE Final    Comment: (NOTE) The Xpert Xpress SARS-CoV-2/FLU/RSV plus assay is intended as an aid in the diagnosis of influenza from Nasopharyngeal swab specimens and should not be used as a sole basis for treatment. Nasal washings and aspirates are unacceptable for Xpert Xpress SARS-CoV-2/FLU/RSV testing.  Fact Sheet for Patients: BloggerCourse.comhttps://www.fda.gov/media/152166/download  Fact Sheet for Healthcare Providers: SeriousBroker.ithttps://www.fda.gov/media/152162/download  This test is not yet approved or cleared by the [place] FDA and has been authorized for detection and/or diagnosis of SARS-CoV-2 by FDA under an Emergency Use Authorization (EUA). This EUA will remain in effect (meaning this test can be used) for the duration of the COVID-19 declaration under Section 564(b)(1) of the Act, 21 U.S.C. section 360bbb-3(b)(1), unless the authorization is terminated or revoked.  Performed at [facility], [address] [place], [place] [address]   MRSA PCR Screening     Status: None   Collection Time: 06/19/20  4:47 PM   Specimen: Nasal Mucosa; Nasopharyngeal  Result Value Ref Range Status   MRSA by PCR NEGATIVE NEGATIVE Final    Comment:        The GeneXpert MRSA Assay (FDA approved for NASAL specimens only), is one component of a comprehensive MRSA colonization surveillance program. It is not intended to diagnose MRSA infection nor to guide or monitor treatment for MRSA infections. Performed at [facility], [address] [place], [place] [address]     Procedures and diagnostic studies:  CT ABDOMEN PELVIS WO CONTRAST  Result Date: 06/19/2020 CLINICAL DATA:  Acute,  nonlocalized abdominal pain,  nausea, vomiting EXAM: CT ABDOMEN AND PELVIS WITHOUT CONTRAST TECHNIQUE: Multidetector CT imaging of the abdomen and pelvis was performed following the standard protocol without IV contrast. COMPARISON:  None. FINDINGS: Lower chest: The visualized lung bases are clear bilaterally. Visualized heart and pericardium are unremarkable. Hepatobiliary: No focal liver abnormality is seen. Status post cholecystectomy. No biliary dilatation. Pancreas: Unremarkable Spleen: Unremarkable Adrenals/Urinary Tract: Adrenal glands are unremarkable. Kidneys are normal, without renal calculi, focal lesion, or hydronephrosis. Bladder is unremarkable. Stomach/Bowel: The ileocecal valve appears enlarged and protrudes into the colonic lumen, best noted on coronal imaging. This is new since prior examination. There is no evidence of obstruction. This may relate to localized edema though an infiltrative mass could appear similarly. Mild cecal diverticulosis is also identified. No significant pericecal inflammatory change, however, is identified. The stomach, small bowel, and large bowel are otherwise unremarkable. Appendix normal. No free intraperitoneal gas or fluid. Vascular/Lymphatic: No significant vascular findings are present. No enlarged abdominal or pelvic lymph nodes. Reproductive: Uterus and bilateral adnexa are unremarkable. Other: Mild subcutaneous infiltration of the pannus is again identified possibly representing benign edema though inflammatory conditions, such as panniculitis, could appear in similar fashion. Musculoskeletal: Right hip pinning has been performed. No acute bone abnormality. No lytic or blastic bone lesions are identified. IMPRESSION: Enlargement of the ileocecal valve which protrudes into the cecal lumen. This may relate to local edema or infiltration and correlation with the patient's recent screening endoscopy is recommended. No evidence of obstruction at this time.  Electronically Signed   By: Helyn Numbers MD   On: 06/19/2020 07:13               LOS: 1 day   Adrion Menz  [facility]   Pager on [email or web address]. If 7PM-7AM, please contact night-coverage at [email or web address]     06/20/2020, 9:57 AM

## 2020-06-20 NOTE — Progress Notes (Signed)
GI Inpatient Follow-up Note  Subjective:  Patient seen in follow-up for nausea, vomiting, and abdominal pain and euglycemic DKA. No acute events overnight. No new complaints. Pt reports her abdominal pain has resolved. She has continued to have nausea and dry heaving, but no significant emesis. She has remained NPO. No LGI complaints such as diarrhea, rectal bleeding, or rectal pain.   Scheduled Inpatient Medications:  . amLODipine  5 mg Oral Daily  . aspirin EC  81 mg Oral Daily  . carvedilol  3.125 mg Oral BID WC  . Chlorhexidine Gluconate Cloth  6 each Topical Daily  . [START ON 06/21/2020] enoxaparin (LOVENOX) injection  0.5 mg/kg Subcutaneous Q24H  . folic acid  1 mg Oral Daily  . multivitamin with minerals  1 tablet Oral Daily  . pantoprazole (PROTONIX) IV  40 mg Intravenous Q24H  . quinapril  40 mg Oral Daily  . rosuvastatin  20 mg Oral QODAY  . thiamine  100 mg Oral Daily   Or  . thiamine  100 mg Intravenous Daily    Continuous Inpatient Infusions:   . dextrose 100 mL/hr at 06/20/20 1700  . insulin 2.2 mL/hr at 06/20/20 1700  . promethazine (PHENERGAN) injection Stopped (06/20/20 1207)    PRN Inpatient Medications:  dextrose, hydrALAZINE, LORazepam **OR** LORazepam, morphine injection, ondansetron (ZOFRAN) IV, promethazine (PHENERGAN) injection  Review of Systems: Constitutional: Weight is stable.  Eyes: No changes in vision. ENT: No oral lesions, sore throat.  GI: see HPI.  Heme/Lymph: No easy bruising.  CV: No chest pain.  GU: No hematuria.  Integumentary: No rashes.  Neuro: No headaches.  Psych: No depression/anxiety.  Endocrine: No heat/cold intolerance.  Allergic/Immunologic: No urticaria.  Resp: No cough, SOB.  Musculoskeletal: No joint swelling.    Physical Examination: BP (!) 157/86   Pulse (!) 101   Temp 98.2 F (36.8 C) (Oral)   Resp 17   Ht 5\' 10"  (1.778 m)   Wt 106.6 kg   SpO2 98%   BMI 33.72 kg/m   Somnolent, but arouseable and answers all  questions appropriately. No family bedside.  Gen: NAD, alert and oriented x 4 HEENT: PEERLA, EOMI, Neck: supple, no JVD or thyromegaly Chest: CTA bilaterally, no wheezes, crackles, or other adventitious sounds CV: RRR, no m/g/c/r Abd: soft, NT, ND, +BS in all four quadrants; no HSM, guarding, ridigity, or rebound tenderness Ext: no edema, well perfused with 2+ pulses, Skin: no rash or lesions noted Lymph: no LAD  Data: Lab Results  Component Value Date   WBC 15.8 (H) 06/20/2020   HGB 14.5 06/20/2020   HCT 43.6 06/20/2020   MCV 90.3 06/20/2020   PLT 252 06/20/2020   Recent Labs  Lab 06/19/20 0516 06/20/20 0556  HGB 16.0* 14.5   Lab Results  Component Value Date   NA 134 (L) 06/20/2020   K 4.1 06/20/2020   CL 101 06/20/2020   CO2 21 (L) 06/20/2020   BUN 11 06/20/2020   CREATININE 0.60 06/20/2020   GLU 219 09/15/2013   Lab Results  Component Value Date   ALT 29 06/19/2020   AST 28 06/19/2020   ALKPHOS 63 06/19/2020   BILITOT 1.2 06/19/2020   No results for input(s): APTT, INR, PTT in the last 168 hours. Assessment/Plan:  50 y/o AA female with a PMH of HTN, HLD, IDDM, and GERD admitted for severe bilateral lower abdominal pain with associated nausea and vomiting. CT scan showed enlargement of the ileocecal valve which protrudes into the cecal lumen.  Patient also found to have euglycemic   1. Bilateral lower abdominal pain - resolved today. - Differential includes bacterial/viral gastroenteritis, partial gastric outlet obstruction, peptic ulcer disease +/- H pylori, gastritis, diabetic gastroparesis, small bowel Crohn's disease, functional GI disorder, or malignancy. - Upper GI series not performed today  2. Non-intractable nausea and vomiting - nausea improving on scheduled anti-emetics. No significant emesis.   3. Abnormal CT scan - enlargement of ileocecal valve which protrudes into the cecal lumen, possibly 2/2 local edema or infiltration  4. Leukocytosis  with left shift - WBC 16.4 on admission with left shift and increased neutrophils. Improving. Continue to monitor.   5. Euglycemic DKA - she is on IV insulin infusion and dextrose.  - Appreciate hospitalist care  COVID-19 TEST: NEGATIVE   Recommendations:  1. UGI obstructive symptoms are improving. Nausea is biggest challenge, but she is responding to scheduled anti-emetics 2. Upper GI series w/ SBFT not performed today due to hourly glucose checks and likely not able to handle oral contrast 3. Attempt UGIS s/ SBFT tomorrow 4. Collect H pylori stool antigen 5. Ideally perform colonoscopy as an elective procedure as an outpatient 6. Following with you   Please call with questions or concerns.    Jacob Moores, PA-C [facility] [phone] (Cell)

## 2020-06-21 ENCOUNTER — Inpatient Hospital Stay: Payer: BC Managed Care – PPO

## 2020-06-21 LAB — BETA-HYDROXYBUTYRIC ACID
Beta-Hydroxybutyric Acid: 0.32 mmol/L — ABNORMAL HIGH (ref 0.05–0.27)
Beta-Hydroxybutyric Acid: 0.35 mmol/L — ABNORMAL HIGH (ref 0.05–0.27)

## 2020-06-21 LAB — BASIC METABOLIC PANEL
Anion gap: 10 (ref 5–15)
Anion gap: 10 (ref 5–15)
BUN: 10 mg/dL (ref 6–20)
BUN: 11 mg/dL (ref 6–20)
CO2: 22 mmol/L (ref 22–32)
CO2: 23 mmol/L (ref 22–32)
Calcium: 9 mg/dL (ref 8.9–10.3)
Calcium: 9.1 mg/dL (ref 8.9–10.3)
Chloride: 100 mmol/L (ref 98–111)
Chloride: 98 mmol/L (ref 98–111)
Creatinine, Ser: 0.51 mg/dL (ref 0.44–1.00)
Creatinine, Ser: 0.62 mg/dL (ref 0.44–1.00)
GFR, Estimated: >60 mL/min (ref >60)
GFR, Estimated: >60 mL/min (ref >60)
Glucose, Bld: 176 mg/dL — ABNORMAL HIGH (ref 70–99)
Glucose, Bld: 191 mg/dL — ABNORMAL HIGH (ref 70–99)
Potassium: 3.5 mmol/L (ref 3.5–5.1)
Potassium: 3.6 mmol/L (ref 3.5–5.1)
Sodium: 131 mmol/L — ABNORMAL LOW (ref 135–145)
Sodium: 132 mmol/L — ABNORMAL LOW (ref 135–145)

## 2020-06-21 LAB — GLUCOSE, CAPILLARY
Glucose-Capillary: 144 mg/dL — ABNORMAL HIGH (ref 70–99)
Glucose-Capillary: 144 mg/dL — ABNORMAL HIGH (ref 70–99)
Glucose-Capillary: 159 mg/dL — ABNORMAL HIGH (ref 70–99)
Glucose-Capillary: 166 mg/dL — ABNORMAL HIGH (ref 70–99)
Glucose-Capillary: 166 mg/dL — ABNORMAL HIGH (ref 70–99)
Glucose-Capillary: 175 mg/dL — ABNORMAL HIGH (ref 70–99)
Glucose-Capillary: 175 mg/dL — ABNORMAL HIGH (ref 70–99)
Glucose-Capillary: 179 mg/dL — ABNORMAL HIGH (ref 70–99)
Glucose-Capillary: 200 mg/dL — ABNORMAL HIGH (ref 70–99)
Glucose-Capillary: 204 mg/dL — ABNORMAL HIGH (ref 70–99)
Glucose-Capillary: 236 mg/dL — ABNORMAL HIGH (ref 70–99)

## 2020-06-21 MED ORDER — INSULIN ASPART 100 UNIT/ML ~~LOC~~ SOLN
0.0000 [IU] | Freq: Three times a day (TID) | SUBCUTANEOUS | Status: DC
  Administered 2020-06-21: 5 [IU] via SUBCUTANEOUS
  Administered 2020-06-21 – 2020-06-22 (×2): 2 [IU] via SUBCUTANEOUS
  Administered 2020-06-22 – 2020-06-23 (×3): 3 [IU] via SUBCUTANEOUS
  Administered 2020-06-23: 2 [IU] via SUBCUTANEOUS
  Filled 2020-06-21 (×7): qty 1

## 2020-06-21 MED ORDER — INSULIN ASPART 100 UNIT/ML ~~LOC~~ SOLN: 0.0000 [IU] | Freq: Every day | SUBCUTANEOUS | Status: DC

## 2020-06-21 MED ORDER — INSULIN GLARGINE 100 UNIT/ML ~~LOC~~ SOLN
10.0000 [IU] | Freq: Two times a day (BID) | SUBCUTANEOUS | Status: DC
  Administered 2020-06-21 (×2): 10 [IU] via SUBCUTANEOUS
  Filled 2020-06-21 (×4): qty 0.1

## 2020-06-21 NOTE — Progress Notes (Signed)
Progress Note    Julia HETT  Brown:[id number] DOB: 11/22/1970  DOA: 06/19/2020 PCP: Allegra Grana, FNP      Brief Narrative:    Medical records reviewed and are as summarized below:  Julia Brown is a 50 y.o. female with medical history significant for IDDM on Lantus and Jardiance, GERD, gastritis, hypertension, dyslipidemia, obesity, tobacco use disorder, alcohol use disorder.  She presented to the hospital with nausea, vomiting and abdominal pain.  She was admitted to the hospital for euglycemic DKA.  She was treated with IV fluids and IV insulin infusion.    Assessment/Plan:   Principal Problem:   Abdominal pain Active Problems:   DKA, type 2, not at goal [facility])   Tobacco abuse   Essential hypertension   Alcohol dependence (HCC)   DKA (diabetic ketoacidosis) (HCC)    Body mass index is 33.72 kg/m.  (Obesity)  Insulin-dependent diabetes mellitus with euglycemic DKA: DKA has resolved.  IV insulin infusion and dextrose infusion has been discontinued.  She has been started on low-dose Lantus because of poor oral intake.    Nausea: Antiemetics as needed.  Vomiting and abdominal pain have resolved.  No esophageal abnormality was noted on x-ray of esophagus.  Hypertension: Continue antihypertensives  Tobacco and alcohol use disorder: Patient has been counseled to quit decreasing alcohol cigarettes.  Ativan as needed per CIWA protocol.  Possible discharge home tomorrow.  Diet Order            Diet heart healthy/carb modified Room service appropriate? Yes; Fluid consistency: Thin  Diet effective now                    Consultants:  Gastroenterologist  Procedures:  None    Medications:   . amLODipine  5 mg Oral Daily  . aspirin EC  81 mg Oral Daily  . carvedilol  3.125 mg Oral BID WC  . Chlorhexidine Gluconate Cloth  6 each Topical Daily  . enoxaparin (LOVENOX) injection  0.5 mg/kg Subcutaneous Q24H  . folic acid  1 mg Oral Daily  .  insulin aspart  0-15 Units Subcutaneous TID WC  . insulin aspart  0-5 Units Subcutaneous QHS  . insulin glargine  10 Units Subcutaneous BID  . multivitamin with minerals  1 tablet Oral Daily  . pantoprazole (PROTONIX) IV  40 mg Intravenous Q24H  . quinapril  40 mg Oral Daily  . rosuvastatin  20 mg Oral QODAY  . thiamine  100 mg Oral Daily   Or  . thiamine  100 mg Intravenous Daily   Continuous Infusions: . promethazine (PHENERGAN) injection Stopped (06/21/20 0953)     Anti-infectives (From admission, onward)   Start     Dose/Rate Route Frequency Ordered Stop   06/19/20 1030  ciprofloxacin (CIPRO) IVPB 400 mg        400 mg 200 mL/hr over 60 Minutes Intravenous  Once 06/19/20 1015 06/19/20 1137   06/19/20 1030  metroNIDAZOLE (FLAGYL) IVPB 500 mg        500 mg 100 mL/hr over 60 Minutes Intravenous  Once 06/19/20 1015 06/19/20 1147             Family Communication/Anticipated D/C date and plan/Code Status   DVT prophylaxis:      Code Status: Full Code  Family Communication: None Disposition Plan:    Status is: Inpatient  Remains inpatient appropriate because:IV treatments appropriate due to intensity of illness or inability to take PO and Inpatient  level of care appropriate due to severity of illness   Dispo: The patient is from: Home              Anticipated d/c is to: Home              Patient currently is not medically stable to d/c.   Difficult to place patient No           Subjective:   C/o nausea and dry heaves.  No vomiting, diarrhea, constipation or abdominal pain  Objective:    Vitals:   06/21/20 1100 06/21/20 1200 06/21/20 1300 06/21/20 1400  BP: (!) 166/97 (!) 158/103 (!) 164/101 (!) 167/107  Pulse: 86 88 91 (!) 101  Resp: 11 17 16 13   Temp:      TempSrc:      SpO2: 96% 98% 96% 97%  Weight:      Height:       No data found.   Intake/Output Summary (Last 24 hours) at 06/21/2020 1614 Last data filed at 06/21/2020 1500 Gross per  24 hour  Intake 1655.99 ml  Output 1450 ml  Net 205.99 ml   Filed Weights   06/19/20 0450  Weight: 106.6 kg    Exam:  GEN: NAD SKIN: No rash EYES: EOMI ENT: MMM CV: RRR PULM: CTA B ABD: soft, obese, NT, +BS CNS: AAO x 3, non focal EXT: No edema or tenderness          Data Reviewed:   I have personally reviewed following labs and imaging studies:  Labs: Labs show the following:   Results for ALBENA, COMES (MRN [name]) as of 06/20/2020 14:10  Ref. Range 06/19/2020 16:43 06/19/2020 19:09 06/19/2020 21:50 06/20/2020 01:36 06/20/2020 05:56  Beta-Hydroxybutyric Acid Latest Ref Range: 0.05 - 0.27 mmol/L 4.78 (H)  3.56 (H)  2.06 (H)    Basic Metabolic Panel: Recent Labs  Lab 06/19/20 1643 06/19/20 1909 06/20/20 0136 06/20/20 0556 06/20/20 1559 06/21/20 0016 06/21/20 0730  NA 136   < > 136 136 134* 132* 131*  K 4.6   < > 4.5 4.2 4.1 3.5 3.6  CL 107   < > 107 106 101 100 98  CO2 14*   < > 19* 19* 21* 22 23  GLUCOSE 132*   < > 153* 160* 171* 176* 191*  BUN 14   < > 14 13 11 11 10   CREATININE 0.80   < > 0.66 0.75 0.60 0.51 0.62  CALCIUM 9.2   < > 9.1 9.2 9.2 9.1 9.0  MG 2.4  --   --   --   --   --   --   PHOS 4.5  --   --   --   --   --   --    < > = values in this interval not displayed.   GFR Estimated Creatinine Clearance: 112.4 mL/min (by C-G formula based on SCr of 0.62 mg/dL). Liver Function Tests: Recent Labs  Lab 06/19/20 0516  AST 28  ALT 29  ALKPHOS 63  BILITOT 1.2  PROT 8.6*  ALBUMIN 5.0   Recent Labs  Lab 06/19/20 0516  LIPASE 27   No results for input(s): AMMONIA in the last 168 hours. Coagulation profile No results for input(s): INR, PROTIME in the last 168 hours.  CBC: Recent Labs  Lab 06/19/20 0516 06/20/20 0556  WBC 16.4* 15.8*  NEUTROABS 14.2*  --   HGB 16.0* 14.5  HCT 46.4* 43.6  MCV 87.9 90.3  PLT 294 252   Cardiac Enzymes: No results for input(s): CKTOTAL, CKMB, CKMBINDEX, TROPONINI in the last 168 hours. BNP  (last 3 results) No results for input(s): PROBNP in the last 8760 hours. CBG: Recent Labs  Lab 06/21/20 0629 06/21/20 0727 06/21/20 0905 06/21/20 1200 06/21/20 1559  GLUCAP 166* 200* 236* 204* 144*   D-Dimer: No results for input(s): DDIMER in the last 72 hours. Hgb A1c: Recent Labs    06/19/20 1216 06/19/20 1643  HGBA1C 7.2* 7.1*   Lipid Profile: No results for input(s): CHOL, HDL, LDLCALC, TRIG, CHOLHDL, LDLDIRECT in the last 72 hours. Thyroid function studies: No results for input(s): TSH, T4TOTAL, T3FREE, THYROIDAB in the last 72 hours.  Invalid input(s): FREET3 Anemia work up: No results for input(s): VITAMINB12, FOLATE, FERRITIN, TIBC, IRON, RETICCTPCT in the last 72 hours. Sepsis Labs: Recent Labs  Lab 06/19/20 0516 06/20/20 0556  WBC 16.4* 15.8*    Microbiology Recent Results (from the past 240 hour(s))  Resp Panel by RT-PCR (Flu A&B, Covid) Nasopharyngeal Swab     Status: None   Collection Time: 06/19/20 10:48 AM   Specimen: Nasopharyngeal Swab; Nasopharyngeal(NP) swabs in vial transport medium  Result Value Ref Range Status   SARS Coronavirus 2 by RT PCR NEGATIVE NEGATIVE Final    Comment: (NOTE) SARS-CoV-2 target nucleic acids are NOT DETECTED.  The SARS-CoV-2 RNA is generally detectable in upper respiratory specimens during the acute phase of infection. The lowest concentration of SARS-CoV-2 viral copies this assay can detect is 138 copies/mL. A negative result does not preclude SARS-Cov-2 infection and should not be used as the sole basis for treatment or other patient management decisions. A negative result may occur with  improper specimen collection/handling, submission of specimen other than nasopharyngeal swab, presence of viral mutation(s) within the areas targeted by this assay, and inadequate number of viral copies(<138 copies/mL). A negative result must be combined with clinical observations, patient history, and  epidemiological information. The expected result is Negative.  Fact Sheet for Patients:  BloggerCourse.comhttps://www.fda.gov/media/152166/download  Fact Sheet for Healthcare Providers:  SeriousBroker.ithttps://www.fda.gov/media/152162/download  This test is no t yet approved or cleared by the [place] FDA and  has been authorized for detection and/or diagnosis of SARS-CoV-2 by FDA under an Emergency Use Authorization (EUA). This EUA will remain  in effect (meaning this test can be used) for the duration of the COVID-19 declaration under Section 564(b)(1) of the Act, 21 U.S.C.section 360bbb-3(b)(1), unless the authorization is terminated  or revoked sooner.       Influenza A by PCR NEGATIVE NEGATIVE Final   Influenza B by PCR NEGATIVE NEGATIVE Final    Comment: (NOTE) The Xpert Xpress SARS-CoV-2/FLU/RSV plus assay is intended as an aid in the diagnosis of influenza from Nasopharyngeal swab specimens and should not be used as a sole basis for treatment. Nasal washings and aspirates are unacceptable for Xpert Xpress SARS-CoV-2/FLU/RSV testing.  Fact Sheet for Patients: BloggerCourse.comhttps://www.fda.gov/media/152166/download  Fact Sheet for Healthcare Providers: SeriousBroker.ithttps://www.fda.gov/media/152162/download  This test is not yet approved or cleared by the [place] FDA and has been authorized for detection and/or diagnosis of SARS-CoV-2 by FDA under an Emergency Use Authorization (EUA). This EUA will remain in effect (meaning this test can be used) for the duration of the COVID-19 declaration under Section 564(b)(1) of the Act, 21 U.S.C. section 360bbb-3(b)(1), unless the authorization is terminated or revoked.  Performed at [facility], [address] [place], [place] [address]   MRSA PCR Screening     Status:  None   Collection Time: 06/19/20  4:47 PM   Specimen: Nasal Mucosa; Nasopharyngeal  Result Value Ref Range Status   MRSA by PCR NEGATIVE NEGATIVE Final    Comment:        The GeneXpert MRSA  Assay (FDA approved for NASAL specimens only), is one component of a comprehensive MRSA colonization surveillance program. It is not intended to diagnose MRSA infection nor to guide or monitor treatment for MRSA infections. Performed at [facility], [address] [place], [place] [address]     Procedures and diagnostic studies:  DG ESOPHAGUS W DOUBLE CM (HD)  Result Date: 06/21/2020 CLINICAL DATA:  Nausea and vomiting accompanied by lower abdominal pain EXAM: ESOPHOGRAM/BARIUM SWALLOW TECHNIQUE: Single contrast examination was performed using thin barium or water soluble. FLUOROSCOPY TIME:  Fluoroscopy Time:  0.2 minute Radiation Exposure Index (if provided by the fluoroscopic device): 2.2 mGy Number of Acquired Spot Images: 0 COMPARISON:  None. FINDINGS: Patient is extremely nauseous. Moderate amount of stool in the transverse and proximal descending colon. Normal pharyngeal anatomy and motility. Contrast flowed freely through the esophagus without evidence of a stricture or mass. Normal esophageal mucosa without evidence of irregularity or ulceration. Esophageal motility was normal. Patient was unable to drink more than 1 mouthful of contrast due to nausea. IMPRESSION: 1. Patient was unable to drink more than 1 mouthful of contrast due to nausea. No esophageal abnormality. Electronically Signed   By: Elige Ko   On: 06/21/2020 14:31               LOS: 2 days   Corine Solorio  [facility]   Pager on [email or web address]. If 7PM-7AM, please contact night-coverage at [email or web address]     06/21/2020, 4:14 PM

## 2020-06-21 NOTE — H&P (View-Only) (Signed)
GI Inpatient Follow-up Note  Subjective:  Patient seen in follow-up for persistent nausea, vomiting, and euglycemic DKA. No acute events overnight. She remains on insulin gtt and D10 overnight. She continues to have intermittent bouts of nausea and vomiting. Upper GI series performed this morning, but patient unable to drink more than 1 mouthful of contrast due to severe nausea. There was no evidence of overt stricture or mass lesion. Esophageal mucosa looked grossly normal with no evidence of ulceration or inflammation. Pt denies any abdominal pain. No new complaints.    Scheduled Inpatient Medications:  . amLODipine  5 mg Oral Daily  . aspirin EC  81 mg Oral Daily  . carvedilol  3.125 mg Oral BID WC  . Chlorhexidine Gluconate Cloth  6 each Topical Daily  . enoxaparin (LOVENOX) injection  0.5 mg/kg Subcutaneous Q24H  . folic acid  1 mg Oral Daily  . insulin aspart  0-15 Units Subcutaneous TID WC  . insulin aspart  0-5 Units Subcutaneous QHS  . insulin glargine  10 Units Subcutaneous BID  . multivitamin with minerals  1 tablet Oral Daily  . pantoprazole (PROTONIX) IV  40 mg Intravenous Q24H  . quinapril  40 mg Oral Daily  . rosuvastatin  20 mg Oral QODAY  . thiamine  100 mg Oral Daily   Or  . thiamine  100 mg Intravenous Daily    Continuous Inpatient Infusions:   . promethazine (PHENERGAN) injection Stopped (06/21/20 0953)    PRN Inpatient Medications:  dextrose, hydrALAZINE, LORazepam **OR** LORazepam, morphine injection, ondansetron (ZOFRAN) IV, promethazine (PHENERGAN) injection  Review of Systems: Constitutional: Weight is stable.  Eyes: No changes in vision. ENT: No oral lesions, sore throat.  GI: see HPI.  Heme/Lymph: No easy bruising.  CV: No chest pain.  GU: No hematuria.  Integumentary: No rashes.  Neuro: No headaches.  Psych: No depression/anxiety.  Endocrine: No heat/cold intolerance.  Allergic/Immunologic: No urticaria.  Resp: No cough, SOB.   Musculoskeletal: No joint swelling.    Physical Examination: BP (!) 151/104   Pulse 91   Temp 98.2 F (36.8 C) (Oral)   Resp 16   Ht 5\' 10"  (1.778 m)   Wt 106.6 kg   SpO2 100%   BMI 33.72 kg/m  Gen: NAD, alert and oriented x 4 HEENT: PEERLA, EOMI, Neck: supple, no JVD or thyromegaly Chest: CTA bilaterally, no wheezes, crackles, or other adventitious sounds CV: RRR, no m/g/c/r Abd: soft, NT, ND, +BS in all four quadrants; no HSM, guarding, ridigity, or rebound tenderness Ext: no edema, well perfused with 2+ pulses, Skin: no rash or lesions noted Lymph: no LAD  Data: Lab Results  Component Value Date   WBC 15.8 (H) 06/20/2020   HGB 14.5 06/20/2020   HCT 43.6 06/20/2020   MCV 90.3 06/20/2020   PLT 252 06/20/2020   Recent Labs  Lab 06/19/20 0516 06/20/20 0556  HGB 16.0* 14.5   Lab Results  Component Value Date   NA 131 (L) 06/21/2020   K 3.6 06/21/2020   CL 98 06/21/2020   CO2 23 06/21/2020   BUN 10 06/21/2020   CREATININE 0.62 06/21/2020   GLU 219 09/15/2013   Lab Results  Component Value Date   ALT 29 06/19/2020   AST 28 06/19/2020   ALKPHOS 63 06/19/2020   BILITOT 1.2 06/19/2020   No results for input(s): APTT, INR, PTT in the last 168 hours.   Upper GI Series: 06/21/20: FINDINGS: Patient is extremely nauseous. Moderate amount of stool in the  transverse and proximal descending colon.  Normal pharyngeal anatomy and motility. Contrast flowed freely through the esophagus without evidence of a stricture or mass. Normal esophageal mucosa without evidence of irregularity or ulceration. Esophageal motility was normal.  Patient was unable to drink more than 1 mouthful of contrast due to nausea.  IMPRESSION: 1. Patient was unable to drink more than 1 mouthful of contrast due to nausea. No esophageal abnormality.  Assessment/Plan:  50 y/o AA female with aPMH of HTN, HLD,IDDM, and GERDadmitted for severe bilateral lower abdominal pain with  associated nausea and vomiting. CT scan showed enlargement of the ileocecal valve which protrudes into the cecal lumen. Patient also found to have euglycemic   1. Persistent nausea and vomiting - s/p UGI series today but patient only able to drink one mouthful of contrast. Grossly normal esophagus with no stricture or ulceration.   2. Bilateral lower abdominal pain - resolved   3. Abnormal CT scan - enlargement of ileocecal valve which protrudes into the cecal lumen, possibly 2/2 local edema or infiltration  4. Euglycemic DKA - she is on IV insulin infusion and dextrose.  - Appreciate hospitalist care. Improving  COVID-19 TEST: NEGATIVE  Recommendations:  - Due to persistent nausea and vomiting without clear etiology, we will plan for luminal evaluation with EGD tomorrow morning with Dr. Norma Fredrickson - Addition of Reglan IV to anti-emetic regimen - EGD tomorrow with Dr. Norma Fredrickson. NPO after midnight. - See procedure note for findings and further recommendations  I reviewed the risks (including bleeding, perforation, infection, anesthesia complications, cardiac/respiratory complications), benefits and alternatives of EGD. Patient consents to proceed.    Please call with questions or concerns.    Jacob Moores, PA-C [facility] [phone] (Cell)

## 2020-06-21 NOTE — Progress Notes (Signed)
GI Inpatient Follow-up Note  Subjective:  Patient seen in follow-up for persistent nausea, vomiting, and euglycemic DKA. No acute events overnight. She remains on insulin gtt and D10 overnight. She continues to have intermittent bouts of nausea and vomiting. Upper GI series performed this morning, but patient unable to drink more than 1 mouthful of contrast due to severe nausea. There was no evidence of overt stricture or mass lesion. Esophageal mucosa looked grossly normal with no evidence of ulceration or inflammation. Pt denies any abdominal pain. No new complaints.    Scheduled Inpatient Medications:  . amLODipine  5 mg Oral Daily  . aspirin EC  81 mg Oral Daily  . carvedilol  3.125 mg Oral BID WC  . Chlorhexidine Gluconate Cloth  6 each Topical Daily  . enoxaparin (LOVENOX) injection  0.5 mg/kg Subcutaneous Q24H  . folic acid  1 mg Oral Daily  . insulin aspart  0-15 Units Subcutaneous TID WC  . insulin aspart  0-5 Units Subcutaneous QHS  . insulin glargine  10 Units Subcutaneous BID  . multivitamin with minerals  1 tablet Oral Daily  . pantoprazole (PROTONIX) IV  40 mg Intravenous Q24H  . quinapril  40 mg Oral Daily  . rosuvastatin  20 mg Oral QODAY  . thiamine  100 mg Oral Daily   Or  . thiamine  100 mg Intravenous Daily    Continuous Inpatient Infusions:   . promethazine (PHENERGAN) injection Stopped (06/21/20 0953)    PRN Inpatient Medications:  dextrose, hydrALAZINE, LORazepam **OR** LORazepam, morphine injection, ondansetron (ZOFRAN) IV, promethazine (PHENERGAN) injection  Review of Systems: Constitutional: Weight is stable.  Eyes: No changes in vision. ENT: No oral lesions, sore throat.  GI: see HPI.  Heme/Lymph: No easy bruising.  CV: No chest pain.  GU: No hematuria.  Integumentary: No rashes.  Neuro: No headaches.  Psych: No depression/anxiety.  Endocrine: No heat/cold intolerance.  Allergic/Immunologic: No urticaria.  Resp: No cough, SOB.   Musculoskeletal: No joint swelling.    Physical Examination: BP (!) 151/104   Pulse 91   Temp 98.2 F (36.8 C) (Oral)   Resp 16   Ht 5\' 10"  (1.778 m)   Wt 106.6 kg   SpO2 100%   BMI 33.72 kg/m  Gen: NAD, alert and oriented x 4 HEENT: PEERLA, EOMI, Neck: supple, no JVD or thyromegaly Chest: CTA bilaterally, no wheezes, crackles, or other adventitious sounds CV: RRR, no m/g/c/r Abd: soft, NT, ND, +BS in all four quadrants; no HSM, guarding, ridigity, or rebound tenderness Ext: no edema, well perfused with 2+ pulses, Skin: no rash or lesions noted Lymph: no LAD  Data: Lab Results  Component Value Date   WBC 15.8 (H) 06/20/2020   HGB 14.5 06/20/2020   HCT 43.6 06/20/2020   MCV 90.3 06/20/2020   PLT 252 06/20/2020   Recent Labs  Lab 06/19/20 0516 06/20/20 0556  HGB 16.0* 14.5   Lab Results  Component Value Date   NA 131 (L) 06/21/2020   K 3.6 06/21/2020   CL 98 06/21/2020   CO2 23 06/21/2020   BUN 10 06/21/2020   CREATININE 0.62 06/21/2020   GLU 219 09/15/2013   Lab Results  Component Value Date   ALT 29 06/19/2020   AST 28 06/19/2020   ALKPHOS 63 06/19/2020   BILITOT 1.2 06/19/2020   No results for input(s): APTT, INR, PTT in the last 168 hours.   Upper GI Series: 06/21/20: FINDINGS: Patient is extremely nauseous. Moderate amount of stool in the  transverse and proximal descending colon.  Normal pharyngeal anatomy and motility. Contrast flowed freely through the esophagus without evidence of a stricture or mass. Normal esophageal mucosa without evidence of irregularity or ulceration. Esophageal motility was normal.  Patient was unable to drink more than 1 mouthful of contrast due to nausea.  IMPRESSION: 1. Patient was unable to drink more than 1 mouthful of contrast due to nausea. No esophageal abnormality.  Assessment/Plan:  50 y/o AA female with aPMH of HTN, HLD,IDDM, and GERDadmitted for severe bilateral lower abdominal pain with  associated nausea and vomiting. CT scan showed enlargement of the ileocecal valve which protrudes into the cecal lumen. Patient also found to have euglycemic   1. Persistent nausea and vomiting - s/p UGI series today but patient only able to drink one mouthful of contrast. Grossly normal esophagus with no stricture or ulceration.   2. Bilateral lower abdominal pain - resolved   3. Abnormal CT scan - enlargement of ileocecal valve which protrudes into the cecal lumen, possibly 2/2 local edema or infiltration  4. Euglycemic DKA - she is on IV insulin infusion and dextrose.  - Appreciate hospitalist care. Improving  COVID-19 TEST: NEGATIVE  Recommendations:  - Due to persistent nausea and vomiting without clear etiology, we will plan for luminal evaluation with EGD tomorrow morning with Dr. Toledo - Addition of Reglan IV to anti-emetic regimen - EGD tomorrow with Dr. Toledo. NPO after midnight. - See procedure note for findings and further recommendations  I reviewed the risks (including bleeding, perforation, infection, anesthesia complications, cardiac/respiratory complications), benefits and alternatives of EGD. Patient consents to proceed.    Please call with questions or concerns.    Mason Shoua Ulloa, PA-C [facility] [phone] (Cell)    

## 2020-06-21 NOTE — Progress Notes (Signed)
Pt remains on insulin gtt and D10 overnight. Endotool protocol continues to be followed. BG stable @ this time. Pt continues to have intermittent bouts of n/v. PRN Zofran and Phenergan given overnight. Pt in no acute distress @ this time. Will continue to monitor.

## 2020-06-22 ENCOUNTER — Encounter: Payer: Self-pay | Admitting: Internal Medicine

## 2020-06-22 ENCOUNTER — Inpatient Hospital Stay: Payer: BC Managed Care – PPO | Admitting: Anesthesiology

## 2020-06-22 ENCOUNTER — Encounter
Admission: EM | Disposition: A | Discharge status: Home / Self Care | Payer: Self-pay | Source: Home / Self Care | Attending: Internal Medicine

## 2020-06-22 DIAGNOSIS — R110 Nausea: Secondary | ICD-10-CM

## 2020-06-22 HISTORY — PX: ESOPHAGOGASTRODUODENOSCOPY: SHX5428

## 2020-06-22 LAB — GLUCOSE, CAPILLARY
Glucose-Capillary: 158 mg/dL — ABNORMAL HIGH (ref 70–99)
Glucose-Capillary: 136 mg/dL — ABNORMAL HIGH (ref 70–99)
Glucose-Capillary: 154 mg/dL — ABNORMAL HIGH (ref 70–99)
Glucose-Capillary: 140 mg/dL — ABNORMAL HIGH (ref 70–99)
Glucose-Capillary: 196 mg/dL — ABNORMAL HIGH (ref 70–99)
Glucose-Capillary: 153 mg/dL — ABNORMAL HIGH (ref 70–99)

## 2020-06-22 SURGERY — EGD (ESOPHAGOGASTRODUODENOSCOPY)
Anesthesia: General

## 2020-06-22 MED ORDER — INSULIN GLARGINE 100 UNIT/ML ~~LOC~~ SOLN
10.0000 [IU] | Freq: Two times a day (BID) | SUBCUTANEOUS | Status: DC
  Administered 2020-06-22 – 2020-06-23 (×2): 10 [IU] via SUBCUTANEOUS
  Filled 2020-06-22 (×3): qty 0.1

## 2020-06-22 MED ORDER — SUCCINYLCHOLINE CHLORIDE 20 MG/ML IJ SOLN
INTRAMUSCULAR | Status: DC | PRN
  Administered 2020-06-22: 120 mg via INTRAVENOUS

## 2020-06-22 MED ORDER — DIPHENHYDRAMINE HCL 50 MG/ML IJ SOLN: 25.0000 mg | Freq: Once | INTRAMUSCULAR | Status: AC

## 2020-06-22 MED ORDER — ONDANSETRON HCL 4 MG/2ML IJ SOLN
INTRAMUSCULAR | Status: DC | PRN
  Administered 2020-06-22: 4 mg via INTRAVENOUS

## 2020-06-22 MED ORDER — PROPOFOL 10 MG/ML IV BOLUS
INTRAVENOUS | Status: AC
  Filled 2020-06-22: qty 20

## 2020-06-22 MED ORDER — LIDOCAINE HCL (CARDIAC) PF 100 MG/5ML IV SOSY
PREFILLED_SYRINGE | INTRAVENOUS | Status: DC | PRN
  Administered 2020-06-22: 100 mg via INTRAVENOUS

## 2020-06-22 MED ORDER — METOCLOPRAMIDE HCL 5 MG/ML IJ SOLN
10.0000 mg | Freq: Four times a day (QID) | INTRAMUSCULAR | Status: DC
  Administered 2020-06-22 – 2020-06-23 (×5): 10 mg via INTRAVENOUS
  Filled 2020-06-22 (×5): qty 2

## 2020-06-22 MED ORDER — LIDOCAINE HCL (PF) 2 % IJ SOLN
INTRAMUSCULAR | Status: AC
  Filled 2020-06-22: qty 5

## 2020-06-22 MED ORDER — FENTANYL CITRATE (PF) 100 MCG/2ML IJ SOLN
INTRAMUSCULAR | Status: AC
  Filled 2020-06-22: qty 2

## 2020-06-22 MED ORDER — ONDANSETRON HCL 4 MG/2ML IJ SOLN
INTRAMUSCULAR | Status: AC
  Filled 2020-06-22: qty 2

## 2020-06-22 MED ORDER — FENTANYL CITRATE (PF) 100 MCG/2ML IJ SOLN
INTRAMUSCULAR | Status: DC | PRN
  Administered 2020-06-22: 50 ug via INTRAVENOUS

## 2020-06-22 MED ORDER — DEXAMETHASONE SODIUM PHOSPHATE 10 MG/ML IJ SOLN
INTRAMUSCULAR | Status: DC | PRN
  Administered 2020-06-22: 5 mg via INTRAVENOUS

## 2020-06-22 MED ORDER — DIPHENHYDRAMINE HCL 50 MG/ML IJ SOLN
INTRAMUSCULAR | Status: AC
  Administered 2020-06-22: 25 mg via INTRAVENOUS
  Filled 2020-06-22: qty 1

## 2020-06-22 MED ORDER — SUCCINYLCHOLINE CHLORIDE 200 MG/10ML IV SOSY
PREFILLED_SYRINGE | INTRAVENOUS | Status: AC
  Filled 2020-06-22: qty 10

## 2020-06-22 MED ORDER — SODIUM CHLORIDE 0.9 % IV SOLN: INTRAVENOUS | Status: DC

## 2020-06-22 MED ORDER — DEXAMETHASONE SODIUM PHOSPHATE 10 MG/ML IJ SOLN
INTRAMUSCULAR | Status: AC
  Filled 2020-06-22: qty 1

## 2020-06-22 MED ORDER — PROPOFOL 10 MG/ML IV BOLUS
INTRAVENOUS | Status: DC | PRN
  Administered 2020-06-22: 160 mg via INTRAVENOUS

## 2020-06-22 MED ORDER — ALPRAZOLAM 0.25 MG PO TABS
0.2500 mg | ORAL_TABLET | Freq: Every day | ORAL | Status: DC | PRN
  Administered 2020-06-22: 0.25 mg via ORAL
  Filled 2020-06-22: qty 1

## 2020-06-22 NOTE — Anesthesia Preprocedure Evaluation (Signed)
Anesthesia Evaluation  Patient identified by MRN, date of birth, ID band Patient awake  General Assessment Comment:Intractable nausea and vomiting, dry heaving with barf bag on preop eval  Reviewed: Allergy & Precautions, NPO status , Patient's Chart, lab work & pertinent test results  History of Anesthesia Complications Negative for: history of anesthetic complications  Airway Mallampati: III  TM Distance: >3 FB Neck ROM: Full    Dental no notable dental hx. (+) Teeth Intact   Pulmonary neg sleep apnea, neg COPD, Current Smoker and Patient abstained from smoking.,    Pulmonary exam normal breath sounds clear to auscultation       Cardiovascular Exercise Tolerance: Good METShypertension, (-) CAD and (-) Past MI (-) dysrhythmias  Rhythm:Regular Rate:Normal - Systolic murmurs    Neuro/Psych PSYCHIATRIC DISORDERS Anxiety negative neurological ROS     GI/Hepatic GERD  ,(+)     (-) substance abuse  ,   Endo/Other  diabetes, Poorly Controlled  Renal/GU negative Renal ROS     Musculoskeletal   Abdominal   Peds  Hematology   Anesthesia Other Findings Past Medical History: No date: Chicken pox No date: Diabetes mellitus No date: GERD (gastroesophageal reflux disease) No date: Hyperlipidemia No date: Hypertension No date: UTI (lower urinary tract infection)  Reproductive/Obstetrics                             Anesthesia Physical Anesthesia Plan  ASA: III  Anesthesia Plan: General   Post-op Pain Management:    Induction: Intravenous and Rapid sequence  PONV Risk Score and Plan: 2 and Ondansetron and Dexamethasone  Airway Management Planned: Oral ETT  Additional Equipment: None  Intra-op Plan:   Post-operative Plan: Extubation in OR  Informed Consent: I have reviewed the patients History and Physical, chart, labs and discussed the procedure including the risks, benefits and  alternatives for the proposed anesthesia with the patient or authorized representative who has indicated his/her understanding and acceptance.     Dental advisory given  Plan Discussed with: CRNA and Surgeon  Anesthesia Plan Comments: (Discussed risks of anesthesia with patient, including PONV, sore throat, aspiration, lip/dental damage. Rare risks discussed as well, such as cardiorespiratory and neurological sequelae. Patient understands. Patient counseled on benefits of smoking cessation, and increased perioperative risks associated with continued smoking. )        Anesthesia Quick Evaluation

## 2020-06-22 NOTE — Progress Notes (Signed)
Patient complaining of feeling anxious and would like something to help with anxiety. States she has family health issues that she is trying to deal with re her mother who is in another state. MD messaged and awaiting further instructions.

## 2020-06-22 NOTE — Transfer of Care (Signed)
Immediate Anesthesia Transfer of Care Note  Patient: SADIYA DURAND  Procedure(s) Performed: ESOPHAGOGASTRODUODENOSCOPY (EGD) (N/A )  Patient Location: PACU  Anesthesia Type:General  Level of Consciousness: awake, alert  and oriented  Airway & Oxygen Therapy: Patient Spontanous Breathing and Patient connected to nasal cannula oxygen  Post-op Assessment: Report given to RN and Post -op Vital signs reviewed and stable  Post vital signs: Reviewed and stable  Last Vitals:  Vitals Value Taken Time  BP 154/103 06/22/20 1113  Temp 37 C 06/22/20 1110  Pulse 108 06/22/20 1113  Resp 16 06/22/20 1110  SpO2 98 % 06/22/20 1113    Last Pain:  Vitals:   06/22/20 1110  TempSrc: Temporal  PainSc: 0-No pain      Patients Stated Pain Goal: 5 (06/19/20 1635)  Complications: No complications documented.

## 2020-06-22 NOTE — Interval H&P Note (Signed)
History and Physical Interval Note:  06/22/2020 10:23 AM  Julia Brown  has presented today for surgery, with the diagnosis of Intractable nausea, intermittent vomiting.  The various methods of treatment have been discussed with the patient and family. After consideration of risks, benefits and other options for treatment, the patient has consented to  Procedure(s): ESOPHAGOGASTRODUODENOSCOPY (EGD) (N/A) as a surgical intervention.  The patient's history has been reviewed, patient examined, no change in status, stable for surgery.  I have reviewed the patient's chart and labs.  Questions were answered to the patient's satisfaction.     [place], [place]

## 2020-06-22 NOTE — Anesthesia Postprocedure Evaluation (Signed)
Anesthesia Post Note  Patient: Julia Brown  Procedure(s) Performed: ESOPHAGOGASTRODUODENOSCOPY (EGD) (N/A )  Patient location during evaluation: PACU Anesthesia Type: General Level of consciousness: awake and alert Pain management: pain level controlled Vital Signs Assessment: post-procedure vital signs reviewed and stable Respiratory status: spontaneous breathing, nonlabored ventilation, respiratory function stable and patient connected to nasal cannula oxygen Cardiovascular status: blood pressure returned to baseline and stable Postop Assessment: no apparent nausea or vomiting Anesthetic complications: no   No complications documented.   Last Vitals:  Vitals:   06/22/20 1413 06/22/20 1527  BP: (!) 145/89 (!) 168/106  Pulse: (!) 102 (!) 106  Resp: 16 17  Temp: 37.1 C 36.9 C  SpO2: 100% 95%    Last Pain:  Vitals:   06/22/20 1527  TempSrc: Oral  PainSc:                  Corinda Gubler

## 2020-06-22 NOTE — Progress Notes (Addendum)
Progress Note    Julia CrumbMonique T Gallentine  ZOX:[id number] DOB: 01/25/1971  DOA: 06/19/2020 PCP: [name], [name], FNP      Brief Narrative:    Medical records reviewed and are as summarized below:  Julia Brown is a 50 y.o. female with medical history significant for IDDM on Lantus and Jardiance, GERD, gastritis, hypertension, dyslipidemia, obesity, tobacco use disorder, alcohol use disorder.  She presented to the hospital with nausea, vomiting and abdominal pain.  She was admitted to the hospital for euglycemic DKA.  She was treated with IV fluids and IV insulin infusion.    Assessment/Plan:   Principal Problem:   Abdominal pain Active Problems:   DKA, type 2, not at goal [facility](HCC)   Tobacco abuse   Essential hypertension   Alcohol dependence (HCC)   DKA (diabetic ketoacidosis) (HCC)   Nausea    Body mass index is 33.72 kg/m.  (Obesity)  Insulin-dependent diabetes mellitus with euglycemic DKA: DKA has resolved.  Lantus was held this morning because of n.p.o. status for endoscopy and poor oral intake.  Nausea, gastritis: S/p EGD today which revealed gastritis.  Continue IV Protonix and antiemetics as needed.  She is on clear liquid diet.  Advance diet as tolerated.  Hypertension: Continue antihypertensives  Tobacco and alcohol use disorder: Patient has been counseled to quit using alcohol or cigarettes.  Ativan as needed per CIWA protocol.  Plan to discharge to home tomorrow.  Diet Order            Diet full liquid Room service appropriate? Yes; Fluid consistency: Thin  Diet effective now           Diet clear liquid Room service appropriate? Yes; Fluid consistency: Thin  Diet effective now                    Consultants:  Gastroenterologist  Procedures:  None    Medications:   . amLODipine  5 mg Oral Daily  . aspirin EC  81 mg Oral Daily  . carvedilol  3.125 mg Oral BID WC  . Chlorhexidine Gluconate Cloth  6 each Topical Daily  . enoxaparin  (LOVENOX) injection  0.5 mg/kg Subcutaneous Q24H  . folic acid  1 mg Oral Daily  . insulin aspart  0-15 Units Subcutaneous TID WC  . insulin aspart  0-5 Units Subcutaneous QHS  . insulin glargine  10 Units Subcutaneous BID  . metoCLOPramide (REGLAN) injection  10 mg Intravenous Q6H  . multivitamin with minerals  1 tablet Oral Daily  . pantoprazole (PROTONIX) IV  40 mg Intravenous Q24H  . quinapril  40 mg Oral Daily  . rosuvastatin  20 mg Oral QODAY  . thiamine  100 mg Oral Daily   Or  . thiamine  100 mg Intravenous Daily   Continuous Infusions:    Anti-infectives (From admission, onward)   Start     Dose/Rate Route Frequency Ordered Stop   06/19/20 1030  ciprofloxacin (CIPRO) IVPB 400 mg        400 mg 200 mL/hr over 60 Minutes Intravenous  Once 06/19/20 1015 06/19/20 1137   06/19/20 1030  metroNIDAZOLE (FLAGYL) IVPB 500 mg        500 mg 100 mL/hr over 60 Minutes Intravenous  Once 06/19/20 1015 06/19/20 1147             Family Communication/Anticipated D/C date and plan/Code Status   DVT prophylaxis:      Code Status: Full Code  Family  Communication: None Disposition Plan:    Status is: Inpatient  Remains inpatient appropriate because:IV treatments appropriate due to intensity of illness or inability to take PO and Inpatient level of care appropriate due to severity of illness   Dispo: The patient is from: Home              Anticipated d/c is to: Home              Patient currently is not medically stable to d/c.   Difficult to place patient No           Subjective:   C/o nausea.  No abdominal pain no vomiting.  Objective:    Vitals:   06/22/20 1238 06/22/20 1300 06/22/20 1413 06/22/20 1527  BP: (!) 157/103 (!) 142/88 (!) 145/89 (!) 168/106  Pulse: 95 97 (!) 102 (!) 106  Resp: 14  16 17   Temp:  98 F (36.7 C) 98.7 F (37.1 C) 98.5 F (36.9 C)  TempSrc:   Oral Oral  SpO2: 100% 97% 100% 95%  Weight:      Height:       No data  found.   Intake/Output Summary (Last 24 hours) at 06/22/2020 1637 Last data filed at 06/22/2020 1200 Gross per 24 hour  Intake 450 ml  Output 990 ml  Net -540 ml   Filed Weights   06/19/20 0450  Weight: 106.6 kg    Exam:  GEN: NAD SKIN: No rash EYES: EOMI ENT: MMM CV: RRR PULM: CTA B ABD: soft, obese, NT, +BS CNS: AAO x 3, non focal EXT: No edema or tenderness         Data Reviewed:   I have personally reviewed following labs and imaging studies:  Labs: Labs show the following:   Results for RAINY, ROTHMAN (MRN Julia Crumb) as of 06/20/2020 14:10  Ref. Range 06/19/2020 16:43 06/19/2020 19:09 06/19/2020 21:50 06/20/2020 01:36 06/20/2020 05:56  Beta-Hydroxybutyric Acid Latest Ref Range: 0.05 - 0.27 mmol/L 4.78 (H)  3.56 (H)  2.06 (H)    Basic Metabolic Panel: Recent Labs  Lab 06/19/20 1643 06/19/20 1909 06/20/20 0136 06/20/20 0556 06/20/20 1559 06/21/20 0016 06/21/20 0730  NA 136   < > 136 136 134* 132* 131*  K 4.6   < > 4.5 4.2 4.1 3.5 3.6  CL 107   < > 107 106 101 100 98  CO2 14*   < > 19* 19* 21* 22 23  GLUCOSE 132*   < > 153* 160* 171* 176* 191*  BUN 14   < > 14 13 11 11 10   CREATININE 0.80   < > 0.66 0.75 0.60 0.51 0.62  CALCIUM 9.2   < > 9.1 9.2 9.2 9.1 9.0  MG 2.4  --   --   --   --   --   --   PHOS 4.5  --   --   --   --   --   --    < > = values in this interval not displayed.   GFR Estimated Creatinine Clearance: 112.4 mL/min (by C-G formula based on SCr of 0.62 mg/dL). Liver Function Tests: Recent Labs  Lab 06/19/20 0516  AST 28  ALT 29  ALKPHOS 63  BILITOT 1.2  PROT 8.6*  ALBUMIN 5.0   Recent Labs  Lab 06/19/20 0516  LIPASE 27   No results for input(s): AMMONIA in the last 168 hours. Coagulation profile No results for input(s): INR, PROTIME in the last 168  hours.  CBC: Recent Labs  Lab 06/19/20 0516 06/20/20 0556  WBC 16.4* 15.8*  NEUTROABS 14.2*  --   HGB 16.0* 14.5  HCT 46.4* 43.6  MCV 87.9 90.3  PLT 294 252    Cardiac Enzymes: No results for input(s): CKTOTAL, CKMB, CKMBINDEX, TROPONINI in the last 168 hours. BNP (last 3 results) No results for input(s): PROBNP in the last 8760 hours. CBG: Recent Labs  Lab 06/21/20 2128 06/22/20 0746 06/22/20 1030 06/22/20 1116 06/22/20 1208  GLUCAP 144* 158* 154* 136* 140*   D-Dimer: No results for input(s): DDIMER in the last 72 hours. Hgb A1c: Recent Labs    06/19/20 1643  HGBA1C 7.1*   Lipid Profile: No results for input(s): CHOL, HDL, LDLCALC, TRIG, CHOLHDL, LDLDIRECT in the last 72 hours. Thyroid function studies: No results for input(s): TSH, T4TOTAL, T3FREE, THYROIDAB in the last 72 hours.  Invalid input(s): FREET3 Anemia work up: No results for input(s): VITAMINB12, FOLATE, FERRITIN, TIBC, IRON, RETICCTPCT in the last 72 hours. Sepsis Labs: Recent Labs  Lab 06/19/20 0516 06/20/20 0556  WBC 16.4* 15.8*    Microbiology Recent Results (from the past 240 hour(s))  Resp Panel by RT-PCR (Flu A&B, Covid) Nasopharyngeal Swab     Status: None   Collection Time: 06/19/20 10:48 AM   Specimen: Nasopharyngeal Swab; Nasopharyngeal(NP) swabs in vial transport medium  Result Value Ref Range Status   SARS Coronavirus 2 by RT PCR NEGATIVE NEGATIVE Final    Comment: (NOTE) SARS-CoV-2 target nucleic acids are NOT DETECTED.  The SARS-CoV-2 RNA is generally detectable in upper respiratory specimens during the acute phase of infection. The lowest concentration of SARS-CoV-2 viral copies this assay can detect is 138 copies/mL. A negative result does not preclude SARS-Cov-2 infection and should not be used as the sole basis for treatment or other patient management decisions. A negative result may occur with  improper specimen collection/handling, submission of specimen other than nasopharyngeal swab, presence of viral mutation(s) within the areas targeted by this assay, and inadequate number of viral copies(<138 copies/mL). A negative result  must be combined with clinical observations, patient history, and epidemiological information. The expected result is Negative.  Fact Sheet for Patients:  BloggerCourse.com  Fact Sheet for Healthcare Providers:  SeriousBroker.it  This test is no t yet approved or cleared by the [place] FDA and  has been authorized for detection and/or diagnosis of SARS-CoV-2 by FDA under an Emergency Use Authorization (EUA). This EUA will remain  in effect (meaning this test can be used) for the duration of the COVID-19 declaration under Section 564(b)(1) of the Act, 21 U.S.C.section 360bbb-3(b)(1), unless the authorization is terminated  or revoked sooner.       Influenza A by PCR NEGATIVE NEGATIVE Final   Influenza B by PCR NEGATIVE NEGATIVE Final    Comment: (NOTE) The Xpert Xpress SARS-CoV-2/FLU/RSV plus assay is intended as an aid in the diagnosis of influenza from Nasopharyngeal swab specimens and should not be used as a sole basis for treatment. Nasal washings and aspirates are unacceptable for Xpert Xpress SARS-CoV-2/FLU/RSV testing.  Fact Sheet for Patients: BloggerCourse.com  Fact Sheet for Healthcare Providers: SeriousBroker.it  This test is not yet approved or cleared by the [place] FDA and has been authorized for detection and/or diagnosis of SARS-CoV-2 by FDA under an Emergency Use Authorization (EUA). This EUA will remain in effect (meaning this test can be used) for the duration of the COVID-19 declaration under Section 564(b)(1) of the Act, 21 U.S.C. section  360bbb-3(b)(1), unless the authorization is terminated or revoked.  Performed at [facility], [address] [place], [place] [address]   MRSA PCR Screening     Status: None   Collection Time: 06/19/20  4:47 PM   Specimen: Nasal Mucosa; Nasopharyngeal  Result Value Ref Range Status   MRSA by  PCR NEGATIVE NEGATIVE Final    Comment:        The GeneXpert MRSA Assay (FDA approved for NASAL specimens only), is one component of a comprehensive MRSA colonization surveillance program. It is not intended to diagnose MRSA infection nor to guide or monitor treatment for MRSA infections. Performed at [facility], [address] [place], [place] [address]     Procedures and diagnostic studies:  DG ESOPHAGUS W DOUBLE CM (HD)  Result Date: 06/21/2020 CLINICAL DATA:  Nausea and vomiting accompanied by lower abdominal pain EXAM: ESOPHOGRAM/BARIUM SWALLOW TECHNIQUE: Single contrast examination was performed using thin barium or water soluble. FLUOROSCOPY TIME:  Fluoroscopy Time:  0.2 minute Radiation Exposure Index (if provided by the fluoroscopic device): 2.2 mGy Number of Acquired Spot Images: 0 COMPARISON:  None. FINDINGS: Patient is extremely nauseous. Moderate amount of stool in the transverse and proximal descending colon. Normal pharyngeal anatomy and motility. Contrast flowed freely through the esophagus without evidence of a stricture or mass. Normal esophageal mucosa without evidence of irregularity or ulceration. Esophageal motility was normal. Patient was unable to drink more than 1 mouthful of contrast due to nausea. IMPRESSION: 1. Patient was unable to drink more than 1 mouthful of contrast due to nausea. No esophageal abnormality. Electronically Signed   By: Elige Ko   On: 06/21/2020 14:31               LOS: 3 days   Dorrell Mitcheltree  [facility]   Pager on [email or web address]. If 7PM-7AM, please contact night-coverage at [email or web address]     06/22/2020, 4:37 PM

## 2020-06-22 NOTE — Op Note (Signed)
[facility] Gastroenterology Patient Name: Julia Brown Procedure Date: 06/22/2020 10:12 AM MRN: [id number] Account #: [id number] Date of Birth: 03-21-1971 Admit Type: Inpatient Age: 49 Room: [facility] ENDO ROOM 4 Gender: Female Note Status: Finalized Procedure:             Upper GI endoscopy Indications:           Persistent vomiting of unknown cause Providers:             Boykin Nearing. Norma Fredrickson MD, MD Referring MD:          No Local Md, MD (Referring MD) Medicines:             Propofol per Anesthesia Complications:         No immediate complications. Procedure:             Pre-Anesthesia Assessment:                        - The risks and benefits of the procedure and the                         sedation options and risks were discussed with the                         patient. All questions were answered and informed                         consent was obtained.                        - Patient identification and proposed procedure were                         verified prior to the procedure by the nurse. The                         procedure was verified in the procedure room.                        - ASA Grade Assessment: III - A patient with severe                         systemic disease.                        - After reviewing the risks and benefits, the patient                         was deemed in satisfactory condition to undergo the                         procedure.                        After obtaining informed consent, the endoscope was                         passed under direct vision. Throughout the procedure,                         the patient's  blood pressure, pulse, and oxygen                         saturations were monitored continuously. The Endoscope                         was introduced through the mouth, and advanced to the                         third part of duodenum. The upper GI endoscopy was                         accomplished without  difficulty. The patient tolerated                         the procedure well. Findings:      The esophagus was normal.      Diffuse moderate inflammation characterized by congestion (edema) and       erythema was found in the entire examined stomach. Two biopsies were       obtained in the gastric body with cold forceps for Helicobacter pylori       testing.      The examined duodenum was normal.      The exam was otherwise without abnormality. Impression:            - Normal esophagus.                        - Gastritis.                        - Normal examined duodenum.                        - The examination was otherwise normal.                        - Two biopsies were obtained in the gastric body. Recommendation:        - Await pathology results.                        - Return patient to hospital ward for ongoing care.                        - Continue present medications.                        - Clear liquid diet today.                        - Advance diet as tolerated daily. Procedure Code(s):     --- Professional ---                        [phone], Esophagogastroduodenoscopy, flexible,                         transoral; with biopsy, single or multiple Diagnosis Code(s):     --- Professional ---                        R11.15, Cyclical vomiting  syndrome unrelated to                         migraine                        K29.70, Gastritis, unspecified, without bleeding CPT copyright 2019 American Medical Association. All rights reserved. The codes documented in this report are preliminary and upon coder review may  be revised to meet current compliance requirements. Stanton Kidney MD, MD 06/22/2020 11:17:18 AM This report has been signed electronically. Number of Addenda: 0 Note Initiated On: 06/22/2020 10:12 AM Estimated Blood Loss:  Estimated blood loss: none.      [facility]

## 2020-06-22 NOTE — Progress Notes (Signed)
Neuro: A&O x 4, moves well, up to bedside commode with standby assist Resp: stable on room air CV: afebrile, vital signs stable at baseline GIGU: voiding in BSC, no BM, nausea with dry heaving prior to EGD Skin: clean dry and intact Social: Patient communicating with family via personal cell phone.  Events: Patient had an EGD this AM.  Transferred to 1A. Report given to [employer]

## 2020-06-22 NOTE — Anesthesia Procedure Notes (Signed)
Procedure Name: Intubation Date/Time: 06/22/2020 10:43 AM Performed by: Ginger Carne, CRNA Pre-anesthesia Checklist: Patient identified, Emergency Drugs available, Suction available, Patient being monitored and Timeout performed Patient Re-evaluated:Patient Re-evaluated prior to induction Oxygen Delivery Method: Circle system utilized Preoxygenation: Pre-oxygenation with 100% oxygen Induction Type: IV induction and Rapid sequence Ventilation: Mask ventilation without difficulty Laryngoscope Size: McGraph and 3 Grade View: Grade I Tube type: Oral Number of attempts: 1 Airway Equipment and Method: Stylet and Video-laryngoscopy Placement Confirmation: ETT inserted through vocal cords under direct vision,  positive ETCO2 and breath sounds checked- equal and bilateral Secured at: 21 cm Tube secured with: Tape Dental Injury: Teeth and Oropharynx as per pre-operative assessment

## 2020-06-23 LAB — GLUCOSE, CAPILLARY
Glucose-Capillary: 149 mg/dL — ABNORMAL HIGH (ref 70–99)
Glucose-Capillary: 181 mg/dL — ABNORMAL HIGH (ref 70–99)

## 2020-06-23 MED ORDER — METOCLOPRAMIDE HCL 10 MG PO TABS: 10.0000 mg | ORAL_TABLET | Freq: Three times a day (TID) | ORAL | 0 refills | Status: DC | PRN

## 2020-06-23 MED ORDER — PANTOPRAZOLE SODIUM 40 MG PO TBEC: 40.0000 mg | DELAYED_RELEASE_TABLET | Freq: Every day | ORAL | 0 refills | Status: DC

## 2020-06-23 NOTE — Progress Notes (Signed)
Pt given d/c instructions, letter for work given per request. Pt also given sub. Abuse resource papers. Pt IV removed. Pt aware of 2 meds to pick up, pt questions answered. Pt family here to take her home, volunteers to take to personal car by [facility]. Pt aox4 and has all belongings

## 2020-06-23 NOTE — Discharge Summary (Signed)
Physician Discharge Summary  Julia CrumbMonique T Palinkas WUJ:[id number] DOB: 09/04/1970 DOA: 06/19/2020  PCP: [name], [name], FNP  Admit date: 06/19/2020 Discharge date: 06/23/2020  Discharge disposition: Home   Recommendations for Outpatient Follow-Up:   Follow-up with PCP in 1 week  Discharge Diagnosis:   Principal Problem:   Abdominal pain Active Problems:   DKA, type 2, not at goal [facility](HCC)   Tobacco abuse   Essential hypertension   Alcohol dependence (HCC)   DKA (diabetic ketoacidosis) (HCC)   Nausea    Discharge Condition: Stable.  Diet recommendation:  Diet Order            Diet heart healthy/carb modified Room service appropriate? Yes; Fluid consistency: Thin  Diet effective now           Diet - low sodium heart healthy           Diet Carb Modified                   Code Status: Full Code     Hospital Course:    Ms. Julia Brown is a 50 y.o. female with medical history significant for IDDM on Lantus and Jardiance, GERD, gastritis, hypertension, dyslipidemia, obesity, tobacco use disorder, alcohol use disorder.  She presented to the hospital with nausea, vomiting and abdominal pain.  She was admitted to the hospital for euglycemic DKA.  She was treated with IV fluids and IV insulin infusion.  DKA has resolved.  Euglycemic DKA was attributed to Jardiance.  He complained of persistent nausea as well with poor oral intake even after resolution of DKA.  She was evaluated by the gastroenterologist. She underwent EGD which showed gastritis.  Her condition has improved and she has been able to tolerate a diet without any vomiting or significant nausea.  She is deemed stable for discharge to home today.  She has been advised to avoid alcohol tobacco/cigarettes.     Medical Consultants:    Gastroenterologist   Discharge Exam:    Vitals:   06/22/20 1932 06/23/20 0103 06/23/20 0407 06/23/20 0801  BP: 134/80 133/90 140/86 139/81  Pulse: 100 (!) 104 (!) 103  95  Resp: 16 16 16 17   Temp: 99.3 F (37.4 C) 98.5 F (36.9 C) 98.7 F (37.1 C) 98.1 F (36.7 C)  TempSrc: Oral Oral Oral Oral  SpO2: 100% 100% 99% 94%  Weight:      Height:         GEN: NAD SKIN: No rash EYES: EOMI ENT: MMM CV: RRR PULM: CTA B ABD: soft, obese, NT, +BS CNS: AAO x 3, non focal EXT: No edema or tenderness   The results of significant diagnostics from this hospitalization (including imaging, microbiology, ancillary and laboratory) are listed below for reference.     Procedures and Diagnostic Studies:   CT ABDOMEN PELVIS WO CONTRAST  Result Date: 06/19/2020 CLINICAL DATA:  Acute, nonlocalized abdominal pain, nausea, vomiting EXAM: CT ABDOMEN AND PELVIS WITHOUT CONTRAST TECHNIQUE: Multidetector CT imaging of the abdomen and pelvis was performed following the standard protocol without IV contrast. COMPARISON:  None. FINDINGS: Lower chest: The visualized lung bases are clear bilaterally. Visualized heart and pericardium are unremarkable. Hepatobiliary: No focal liver abnormality is seen. Status post cholecystectomy. No biliary dilatation. Pancreas: Unremarkable Spleen: Unremarkable Adrenals/Urinary Tract: Adrenal glands are unremarkable. Kidneys are normal, without renal calculi, focal lesion, or hydronephrosis. Bladder is unremarkable. Stomach/Bowel: The ileocecal valve appears enlarged and protrudes into the colonic lumen, best noted on coronal imaging.  This is new since prior examination. There is no evidence of obstruction. This may relate to localized edema though an infiltrative mass could appear similarly. Mild cecal diverticulosis is also identified. No significant pericecal inflammatory change, however, is identified. The stomach, small bowel, and large bowel are otherwise unremarkable. Appendix normal. No free intraperitoneal gas or fluid. Vascular/Lymphatic: No significant vascular findings are present. No enlarged abdominal or pelvic lymph nodes. Reproductive:  Uterus and bilateral adnexa are unremarkable. Other: Mild subcutaneous infiltration of the pannus is again identified possibly representing benign edema though inflammatory conditions, such as panniculitis, could appear in similar fashion. Musculoskeletal: Right hip pinning has been performed. No acute bone abnormality. No lytic or blastic bone lesions are identified. IMPRESSION: Enlargement of the ileocecal valve which protrudes into the cecal lumen. This may relate to local edema or infiltration and correlation with the patient's recent screening endoscopy is recommended. No evidence of obstruction at this time. Electronically Signed   By: Helyn Numbers MD   On: 06/19/2020 07:13     Labs:   Basic Metabolic Panel: Recent Labs  Lab 06/19/20 1643 06/19/20 1909 06/20/20 0136 06/20/20 0556 06/20/20 1559 06/21/20 0016 06/21/20 0730  NA 136   < > 136 136 134* 132* 131*  K 4.6   < > 4.5 4.2 4.1 3.5 3.6  CL 107   < > 107 106 101 100 98  CO2 14*   < > 19* 19* 21* 22 23  GLUCOSE 132*   < > 153* 160* 171* 176* 191*  BUN 14   < > 14 13 11 11 10   CREATININE 0.80   < > 0.66 0.75 0.60 0.51 0.62  CALCIUM 9.2   < > 9.1 9.2 9.2 9.1 9.0  MG 2.4  --   --   --   --   --   --   PHOS 4.5  --   --   --   --   --   --    < > = values in this interval not displayed.   GFR Estimated Creatinine Clearance: 112.4 mL/min (by C-G formula based on SCr of 0.62 mg/dL). Liver Function Tests: Recent Labs  Lab 06/19/20 0516  AST 28  ALT 29  ALKPHOS 63  BILITOT 1.2  PROT 8.6*  ALBUMIN 5.0   Recent Labs  Lab 06/19/20 0516  LIPASE 27   No results for input(s): AMMONIA in the last 168 hours. Coagulation profile No results for input(s): INR, PROTIME in the last 168 hours.  CBC: Recent Labs  Lab 06/19/20 0516 06/20/20 0556  WBC 16.4* 15.8*  NEUTROABS 14.2*  --   HGB 16.0* 14.5  HCT 46.4* 43.6  MCV 87.9 90.3  PLT 294 252   Cardiac Enzymes: No results for input(s): CKTOTAL, CKMB, CKMBINDEX,  TROPONINI in the last 168 hours. BNP: Invalid input(s): POCBNP CBG: Recent Labs  Lab 06/22/20 1116 06/22/20 1208 06/22/20 1636 06/22/20 2121 06/23/20 0804  GLUCAP 136* 140* 196* 153* 149*   D-Dimer No results for input(s): DDIMER in the last 72 hours. Hgb A1c No results for input(s): HGBA1C in the last 72 hours. Lipid Profile No results for input(s): CHOL, HDL, LDLCALC, TRIG, CHOLHDL, LDLDIRECT in the last 72 hours. Thyroid function studies No results for input(s): TSH, T4TOTAL, T3FREE, THYROIDAB in the last 72 hours.  Invalid input(s): FREET3 Anemia work up No results for input(s): VITAMINB12, FOLATE, FERRITIN, TIBC, IRON, RETICCTPCT in the last 72 hours. Microbiology Recent Results (from the past 240 hour(s))  Resp Panel by RT-PCR (Flu A&B, Covid) Nasopharyngeal Swab     Status: None   Collection Time: 06/19/20 10:48 AM   Specimen: Nasopharyngeal Swab; Nasopharyngeal(NP) swabs in vial transport medium  Result Value Ref Range Status   SARS Coronavirus 2 by RT PCR NEGATIVE NEGATIVE Final    Comment: (NOTE) SARS-CoV-2 target nucleic acids are NOT DETECTED.  The SARS-CoV-2 RNA is generally detectable in upper respiratory specimens during the acute phase of infection. The lowest concentration of SARS-CoV-2 viral copies this assay can detect is 138 copies/mL. A negative result does not preclude SARS-Cov-2 infection and should not be used as the sole basis for treatment or other patient management decisions. A negative result may occur with  improper specimen collection/handling, submission of specimen other than nasopharyngeal swab, presence of viral mutation(s) within the areas targeted by this assay, and inadequate number of viral copies(<138 copies/mL). A negative result must be combined with clinical observations, patient history, and epidemiological information. The expected result is Negative.  Fact Sheet for Patients:   BloggerCourse.com  Fact Sheet for Healthcare Providers:  SeriousBroker.it  This test is no t yet approved or cleared by the [place] FDA and  has been authorized for detection and/or diagnosis of SARS-CoV-2 by FDA under an Emergency Use Authorization (EUA). This EUA will remain  in effect (meaning this test can be used) for the duration of the COVID-19 declaration under Section 564(b)(1) of the Act, 21 U.S.C.section 360bbb-3(b)(1), unless the authorization is terminated  or revoked sooner.       Influenza A by PCR NEGATIVE NEGATIVE Final   Influenza B by PCR NEGATIVE NEGATIVE Final    Comment: (NOTE) The Xpert Xpress SARS-CoV-2/FLU/RSV plus assay is intended as an aid in the diagnosis of influenza from Nasopharyngeal swab specimens and should not be used as a sole basis for treatment. Nasal washings and aspirates are unacceptable for Xpert Xpress SARS-CoV-2/FLU/RSV testing.  Fact Sheet for Patients: BloggerCourse.com  Fact Sheet for Healthcare Providers: SeriousBroker.it  This test is not yet approved or cleared by the [place] FDA and has been authorized for detection and/or diagnosis of SARS-CoV-2 by FDA under an Emergency Use Authorization (EUA). This EUA will remain in effect (meaning this test can be used) for the duration of the COVID-19 declaration under Section 564(b)(1) of the Act, 21 U.S.C. section 360bbb-3(b)(1), unless the authorization is terminated or revoked.  Performed at [facility], [address] [place], [place] [address]   MRSA PCR Screening     Status: None   Collection Time: 06/19/20  4:47 PM   Specimen: Nasal Mucosa; Nasopharyngeal  Result Value Ref Range Status   MRSA by PCR NEGATIVE NEGATIVE Final    Comment:        The GeneXpert MRSA Assay (FDA approved for NASAL specimens only), is one component of a comprehensive MRSA  colonization surveillance program. It is not intended to diagnose MRSA infection nor to guide or monitor treatment for MRSA infections. Performed at [facility], [address], [place], [place] [address]      Discharge Instructions:   Discharge Instructions    Diet - low sodium heart healthy   Complete by: As directed    Diet Carb Modified   Complete by: As directed    Increase activity slowly   Complete by: As directed      Allergies as of 06/23/2020      Reactions   Contrast Media [iodinated Diagnostic Agents] Anaphylaxis   Wheezing and rash  immediately after CT w/.   Statins Palpitations   Bactrim [sulfamethoxazole-trimethoprim] Itching   itching   Penicillins Rash   Trulicity [dulaglutide] Nausea Only      Medication List    STOP taking these medications   aspirin EC 81 MG tablet   empagliflozin 25 MG Tabs tablet Commonly known as: JARDIANCE   omeprazole 40 MG capsule Commonly known as: PRILOSEC     TAKE these medications   amLODipine 5 MG tablet Commonly known as: NORVASC Take 1 tablet (5 mg total) by mouth daily.   azelastine 0.1 % nasal spray Commonly known as: ASTELIN Place 1 spray into the nose 2 (two) times daily as needed for rhinitis or allergies.   Basaglar KwikPen 100 UNIT/ML Inject 40 units under the skin every morning What changed: See the new instructions.   carvedilol 3.125 MG tablet Commonly known as: COREG TAKE 1 TABLET BY MOUTH TWICE DAILY WITH A MEAL What changed:   how much to take  how to take this  when to take this   liraglutide 18 MG/3ML Sopn Commonly known as: VICTOZA Start 0.6 mg [place] qd x 1 week, then 1.2 mg [place] qd thereafter   metFORMIN 500 MG 24 hr tablet Commonly known as: GLUCOPHAGE-XR Take 2 tablets (1,000 mg total) by mouth daily with breakfast.   metoCLOPramide 10 MG tablet Commonly known as: REGLAN Take 1 tablet (10 mg total) by mouth every 8 (eight) hours as needed for up to 4 days for nausea  or vomiting.   multivitamin with minerals tablet Take 1 tablet by mouth daily.   pantoprazole 40 MG tablet Commonly known as: Protonix Take 1 tablet (40 mg total) by mouth daily.   quinapril 40 MG tablet Commonly known as: ACCUPRIL Take 1 tablet (40 mg total) by mouth daily.   rosuvastatin 20 MG tablet Commonly known as: Crestor Take 1 tablet (20 mg total) by mouth every other day.   vitamin B-12 1000 MCG tablet Commonly known as: CYANOCOBALAMIN Take 1,000 mcg by mouth daily.       Follow-up Information    Schedule an appointment as soon as possible for a visit  with Pasty Spillers, MD.   Specialty: Gastroenterology Contact information: [address] [place] [address] [phone]                Time coordinating discharge: 27 minutes  Signed:  Lurene Shadow  [facility] 06/23/2020, 10:59 AM   Pager on [email or web address]. If 7PM-7AM, please contact night-coverage at [email or web address]

## 2020-06-23 NOTE — TOC Transition Note (Signed)
Transition of Care [facility]) - CM/SW Discharge Note   Patient Details  Name: Julia Brown MRN: [id number] Date of Birth: March 11, 1971  Transition of Care [facility] CM/SW Contact:  Luvenia Redden, RN Phone Number:[phone] 06/23/2020, 11:13 AM   Clinical Narrative:    RN spoke with pt today concerning substance abuse counseling. Pt aware of the risk concerning her medical condition with the use of substance abuse. Pt verbalized an understanding and receptive to available resources. Provided and placed in discharge packet.   Pt reports she has supportive system in the home alone with her two children and spouse who with provider the necessary transportation services and picking pt up from the hospital later today. Pt lives in an apartment and able to afford all her medications at a local pharmacy. No additional needs presented at this time.  TOC team will continue to follow for d/c needs.  Final next level of care: Home/Self Care Barriers to Discharge: No Barriers Identified   Patient Goals and CMS Choice        Discharge Placement                  Name of family member notified: Pt has called her spouse Julia Brown Patient and family notified of of transfer: 06/23/20  Discharge Plan and Services   Discharge Planning Services: CM Consult                                 Social Determinants of Health (SDOH) Interventions     Readmission Risk Interventions No flowsheet data found.

## 2020-06-24 ENCOUNTER — Encounter: Payer: Self-pay | Admitting: Family

## 2020-06-24 ENCOUNTER — Encounter: Payer: Self-pay | Admitting: Internal Medicine

## 2020-06-25 LAB — SURGICAL PATHOLOGY

## 2020-06-26 ENCOUNTER — Other Ambulatory Visit: Payer: Self-pay

## 2020-06-26 ENCOUNTER — Ambulatory Visit (INDEPENDENT_AMBULATORY_CARE_PROVIDER_SITE_OTHER): Payer: BC Managed Care – PPO | Admitting: Family

## 2020-06-26 ENCOUNTER — Encounter: Payer: Self-pay | Admitting: Family

## 2020-06-26 VITALS — BP 88/56 | HR 96 | Temp 97.6°F | Ht 70.0 in | Wt 219.8 lb

## 2020-06-26 DIAGNOSIS — E119 Type 2 diabetes mellitus without complications: Secondary | ICD-10-CM

## 2020-06-26 DIAGNOSIS — I10 Essential (primary) hypertension: Secondary | ICD-10-CM | POA: Diagnosis not present

## 2020-06-26 DIAGNOSIS — E1110 Type 2 diabetes mellitus with ketoacidosis without coma: Secondary | ICD-10-CM

## 2020-06-26 LAB — CBC WITH DIFFERENTIAL/PLATELET
Basophils Absolute: 0.1 10*3/uL (ref 0.0–0.1)
Basophils Relative: 0.6 % (ref 0.0–3.0)
Eosinophils Absolute: 0.1 10*3/uL (ref 0.0–0.7)
Eosinophils Relative: 1.1 % (ref 0.0–5.0)
HCT: 42.5 % (ref 36.0–46.0)
Hemoglobin: 14.3 g/dL (ref 12.0–15.0)
Lymphocytes Relative: 15.3 % (ref 12.0–46.0)
Lymphs Abs: 1.9 10*3/uL (ref 0.7–4.0)
MCHC: 33.6 g/dL (ref 30.0–36.0)
MCV: 90 fl (ref 78.0–100.0)
Monocytes Absolute: 0.9 10*3/uL (ref 0.1–1.0)
Monocytes Relative: 7.4 % (ref 3.0–12.0)
Neutro Abs: 9.2 10*3/uL — ABNORMAL HIGH (ref 1.4–7.7)
Neutrophils Relative %: 75.6 % (ref 43.0–77.0)
Platelets: 278 10*3/uL (ref 150.0–400.0)
RBC: 4.72 Mil/uL (ref 3.87–5.11)
RDW: 12.7 % (ref 11.5–15.5)
WBC: 12.2 10*3/uL — ABNORMAL HIGH (ref 4.0–10.5)

## 2020-06-26 LAB — BASIC METABOLIC PANEL
BUN: 19 mg/dL (ref 6–23)
CO2: 26 mEq/L (ref 19–32)
Calcium: 9.6 mg/dL (ref 8.4–10.5)
Chloride: 95 mEq/L — ABNORMAL LOW (ref 96–112)
Creatinine, Ser: 1.12 mg/dL (ref 0.40–1.20)
GFR: 57.92 mL/min — ABNORMAL LOW (ref >60.00)
Glucose, Bld: 255 mg/dL — ABNORMAL HIGH (ref 70–99)
Potassium: 3.5 mEq/L (ref 3.5–5.1)
Sodium: 132 mEq/L — ABNORMAL LOW (ref 135–145)

## 2020-06-26 LAB — GLUCOSE, POCT (MANUAL RESULT ENTRY): POC Glucose: 282 mg/dl — AB (ref 70–99)

## 2020-06-26 MED ORDER — QUINAPRIL HCL 40 MG PO TABS: 20.0000 mg | ORAL_TABLET | Freq: Every day | ORAL | 1 refills | Status: DC

## 2020-06-26 MED ORDER — FREESTYLE LIBRE 2 SENSOR MISC: 2 refills | Status: DC

## 2020-06-26 MED ORDER — BASAGLAR KWIKPEN 100 UNIT/ML ~~LOC~~ SOPN: 43.0000 [IU] | PEN_INJECTOR | Freq: Every morning | SUBCUTANEOUS | 1 refills | Status: DC

## 2020-06-26 MED ORDER — METFORMIN HCL ER 500 MG PO TB24: 1000.0000 mg | ORAL_TABLET | Freq: Two times a day (BID) | ORAL | 1 refills | Status: DC

## 2020-06-26 NOTE — Assessment & Plan Note (Signed)
Concerned regarding precipitous drop of blood pressure after hospitalization. We discussed likely related to rapid weight loss in last week. Advised to decrease quinlapril to 20mg  from 40mg . Continue coreg 3.125mg  bid, amlodipine 5mg . Close follow up.

## 2020-06-26 NOTE — Assessment & Plan Note (Addendum)
Uncontrolled. Hospitalization reviewed. Stop jardiance and added as an allergy. Continue basaglar 40 units, metformin 1000mg  qd. We will hold victoza for now however likely we resume GLP -1 after completion of GI work up ( colonoscopy) in setting of ileocecal valve enlarged. Advised to resume checking blood sugars as post prandial blood sugar today 282 and send to me glucose log in 5 days. Start libre CGM. Appointment with me in 2 weeks.

## 2020-06-26 NOTE — Patient Instructions (Addendum)
Please check fasting blood sugars especially as on 40 units basaglar.   Decrease quinlapril to 20mg  from the 40mg .  It is imperative that you are seen AT least twice per year for labs and monitoring. Monitor blood pressure at home and me 5-6 reading on separate days. Goal is less than 120/80, based on newest guidelines, however we certainly want to be less than 130/80;  if persistently higher, please make sooner follow up appointment so we can recheck you blood pressure and manage/ adjust medications.  KEYS TO REMEMBER WITH INSULIN THERAPY:   Pair protein ( such as egg, peanut butter, avocado, bacon, yogurt, deli meat) with your breakfast , particularly if primary consists of carbs.  This will stabilize blood sugar and also keep you fuller for longer.   PLENTY OF WATER.    Always carry glucose tablets with you. You may find these over the counter and in lots of different flavors.    Fasting blood sugar goal in the morning is greater than 80 and less than 120.     If eating low carb meal, take LESS fast acting insulin.   Call [facility] clinic if: BG < 70 or > 300.  BOTTOM line: if most of your blood sugars fall between 140-150 whether fasting in the morning, before meals, then we will reach our goal of an a1c of 6.5%,    If you have any symptoms of low blood sugar ( sweating, shakiness, lightheaded, dizzy) , please notify me. If you have a low blood sugar, please drink a glass of orange juice and recheck blood sugar every 5 minutes until you don't feel symptomatic AND blood sugar is above 80.   Call me with any concerns.

## 2020-06-26 NOTE — Progress Notes (Signed)
Subjective:    Patient ID: Julia Brown, female    DOB: 1970-07-27, 50 y.o.   MRN: [id number]  CC: Julia Brown is a 50 y.o. female who presents today for follow up.   HPI:  Complains of  feeling 'foggy' this morning. No syncope, dizziness, HA, vision changes. She last ate 3 hours ago and reports eating eggs, one piece of bacon, and potatoes today without nausea, abdominal pain. Drinking plenty of water.   No polyuria, polydipsia, blurry vision, constipation.   Nausea resolved and she has been eating more regularly.  She hasnt had to use reglan in 4 days. She attributes weight loss to 7 days ago when she was nauseated and not eating.   Hospitalized for euglycemic DKA, attributed to jardiance.  Admitted 06/19/20, Discharged 06/23/20  Colonoscopy planned for 08/16/20 Dr Tobi Bastos 06/21/20- barium swallow incomplete.  EDG showed gastritis 06/22/20  Presented to ED 06/19/20 for abdominal pain and admitted.   Jardiance, aspirin and omeprazole discontinued.   Started on basaglar 40 units qam. No hypoglycemic episodes. She hasnt checked blood sugar. She has stopped victoza due to nausea.   Shei is compliant with metformin 1000mg  QD She is on protonix 30mg  qd  CT a /p 06/19/20 - ileocecal valve enlarged. She is seeing Dr tomorrow.   HTN- compliant with Coreg 3.125 mg bid, quinapril 40mg  qd, amlodipine 5mg . No sob, cp.    HISTORY:   Past Medical History:  Diagnosis Date  . Chicken pox   . Diabetes mellitus   . GERD (gastroesophageal reflux disease)   . Hyperlipidemia   . Hypertension   . UTI (lower urinary tract infection)    Past Surgical History:  Procedure Laterality Date  . ABLATION  2007   excessive bleeding  . CESAREAN SECTION  1994  . CHOLECYSTECTOMY  1993  . ESOPHAGOGASTRODUODENOSCOPY N/A 06/22/2020   Procedure: ESOPHAGOGASTRODUODENOSCOPY (EGD);  Surgeon: Toledo, , MD;  Location: [facility] ENDOSCOPY;  Service: Gastroenterology;  Laterality: N/A;  . ORIF FEMUR  FRACTURE Right    [place]  . TUBAL LIGATION    . uterine ablation     Family History  Problem Relation Age of Onset  . Heart disease Father   . Hyperlipidemia Father   . Diabetes Father   . Hyperlipidemia Mother   . Hypertension Mother   . Schizophrenia Mother   . Depression Mother   . Diabetes Mother   . Heart disease Other   . Cancer Maternal Aunt        breast  . Diabetes Maternal Grandmother   . Diabetes Maternal Grandfather   . Thyroid cancer Neg Hx     Allergies: Contrast media [iodinated diagnostic agents], Statins, Jardiance [empagliflozin], Bactrim [sulfamethoxazole-trimethoprim], Penicillins, and Trulicity [dulaglutide] Current Outpatient Medications on File Prior to Visit  Medication Sig Dispense Refill  . amLODipine (NORVASC) 5 MG tablet Take 1 tablet (5 mg total) by mouth daily. 30 tablet 5  . azelastine (ASTELIN) 0.1 % nasal spray Place 1 spray into the nose 2 (two) times daily as needed for rhinitis or allergies.    . carvedilol (COREG) 3.125 MG tablet TAKE 1 TABLET BY MOUTH TWICE DAILY WITH A MEAL (Patient taking differently: Take 3.125 mg by mouth 2 (two) times daily with a meal. TAKE 1 TABLET BY MOUTH TWICE DAILY WITH A MEAL) 180 tablet 6  . Insulin Glargine (BASAGLAR KWIKPEN) 100 UNIT/ML Inject 40 units under the skin every morning (Patient taking differently: Inject 40 Units into the skin  daily.) 60 mL 1  . metFORMIN (GLUCOPHAGE-XR) 500 MG 24 hr tablet Take 2 tablets (1,000 mg total) by mouth daily with breakfast. 180 tablet 1  . metoCLOPramide (REGLAN) 10 MG tablet Take 1 tablet (10 mg total) by mouth every 8 (eight) hours as needed for up to 4 days for nausea or vomiting. 12 tablet 0  . Multiple Vitamins-Minerals (MULTIVITAMIN WITH MINERALS) tablet Take 1 tablet by mouth daily.    . pantoprazole (PROTONIX) 40 MG tablet Take 1 tablet (40 mg total) by mouth daily. 30 tablet 0  . rosuvastatin (CRESTOR) 20 MG tablet Take 1 tablet (20 mg total) by mouth every other  day. 45 tablet 3  . vitamin B-12 (CYANOCOBALAMIN) 1000 MCG tablet Take 1,000 mcg by mouth daily.     No current facility-administered medications on file prior to visit.    Social History   Tobacco Use  . Smoking status: Current Every Day Smoker    Packs/day: 0.50    Years: 10.00    Pack years: 5.00    Types: Cigarettes  . Smokeless tobacco: Never Used  . Tobacco comment: smoked < 1 pack a day  Vaping Use  . Vaping Use: Never used  Substance Use Topics  . Alcohol use: Yes    Alcohol/week: 0.0 standard drinks    Comment: weekend 3-4drinks  . Drug use: No    Review of Systems  Constitutional: Negative for chills and fever.  Eyes: Negative for visual disturbance.  Respiratory: Negative for cough.   Cardiovascular: Negative for chest pain and palpitations.  Gastrointestinal: Negative for abdominal distention, nausea (resolved) and vomiting.  Neurological: Negative for dizziness and headaches.      Objective:    BP (!) 88/56 (BP Location: Right Arm, Patient Position: Sitting, Cuff Size: Large)   Pulse 96   Temp 97.6 F (36.4 C)   Ht 5\' 10"  (1.778 m)   Wt 219 lb 12.8 oz (99.7 kg)   SpO2 99%   BMI 31.54 kg/m  BP Readings from Last 3 Encounters:  06/26/20 (!) 88/56  06/23/20 (!) 141/81  01/10/20 (!) 142/90   Wt Readings from Last 3 Encounters:  06/26/20 219 lb 12.8 oz (99.7 kg)  06/19/20 235 lb (106.6 kg)  01/10/20 235 lb 6.4 oz (106.8 kg)    Physical Exam Vitals reviewed.  Constitutional:      Appearance: She is well-developed.  Eyes:     Conjunctiva/sclera: Conjunctivae normal.  Cardiovascular:     Rate and Rhythm: Normal rate and regular rhythm.     Pulses: Normal pulses.     Heart sounds: Normal heart sounds.  Pulmonary:     Effort: Pulmonary effort is normal.     Breath sounds: Normal breath sounds. No wheezing, rhonchi or rales.  Skin:    General: Skin is warm and dry.  Neurological:     Mental Status: She is alert.  Psychiatric:        Speech:  Speech normal.        Behavior: Behavior normal.        Thought Content: Thought content normal.        Assessment & Plan:   Problem List Items Addressed This Visit      Cardiovascular and Mediastinum   Essential hypertension - Primary (Chronic)    Concerned regarding precipitous drop of blood pressure after hospitalization. We discussed likely related to rapid weight loss in last week. Advised to decrease quinlapril to 20mg  from 40mg . Continue coreg 3.125mg  bid, amlodipine 5mg .  Close follow up.       Relevant Medications   quinapril (ACCUPRIL) 40 MG tablet   Other Relevant Orders   Basic metabolic panel   CBC with Differential/Platelet     Endocrine   DKA, type 2, not at goal [facility])    Uncontrolled. Hospitalization reviewed. Stop jardiance and added as an allergy. Continue basaglar 40 units, metformin 1000mg  qd. We will hold victoza for now however likely we resume GLP -1 after completion of GI work up ( colonoscopy) in setting of ileocecal valve enlarged. Advised to resume checking blood sugars as post prandial blood sugar today 282 and send to me glucose log in 5 days. Start libre CGM. Appointment with me in 2 weeks.       Relevant Medications   quinapril (ACCUPRIL) 40 MG tablet   Continuous Blood Gluc Sensor (FREESTYLE LIBRE 2 SENSOR) MISC    Other Visit Diagnoses    Controlled type 2 diabetes mellitus without complication, without long-term current use of insulin (HCC)       Relevant Medications   quinapril (ACCUPRIL) 40 MG tablet   Continuous Blood Gluc Sensor (FREESTYLE LIBRE 2 SENSOR) MISC   Other Relevant Orders   POCT Glucose (CBG) (Completed)       I have discontinued Kollins T. Brase's liraglutide. I have also changed her quinapril. Additionally, I am having her start on FreeStyle Libre 2 Sensor. Lastly, I am having her maintain her carvedilol, rosuvastatin, amLODipine, azelastine, metFORMIN, vitamin B-12, Basaglar KwikPen, multivitamin with minerals,  pantoprazole, and metoCLOPramide.   Meds ordered this encounter  Medications  . quinapril (ACCUPRIL) 40 MG tablet    Sig: Take 0.5 tablets (20 mg total) by mouth daily.    Dispense:  90 tablet    Refill:  1    Order Specific Question:   Supervising Provider    Answer:   L [[id number]]  . Continuous Blood Gluc Sensor (FREESTYLE LIBRE 2 SENSOR) MISC    Sig: Place once sensor every 14 days.    Dispense:  2 each    Refill:  2    Order Specific Question:   Supervising Provider    Answer:   Duncan Dull [[id number]]    Return precautions given.   Risks, benefits, and alternatives of the medications and treatment plan prescribed today were discussed, and patient expressed understanding.   Education regarding symptom management and diagnosis given to patient on AVS.  Continue to follow with Sherlene Shams, FNP for routine health maintenance.   Allegra Grana and I agreed with plan.   [name], FNP

## 2020-06-27 ENCOUNTER — Ambulatory Visit: Payer: BC Managed Care – PPO | Admitting: Gastroenterology

## 2020-06-27 ENCOUNTER — Other Ambulatory Visit: Payer: Self-pay

## 2020-06-27 ENCOUNTER — Encounter: Payer: Self-pay | Admitting: Gastroenterology

## 2020-06-27 ENCOUNTER — Encounter: Payer: Self-pay | Admitting: Family

## 2020-06-27 ENCOUNTER — Ambulatory Visit (INDEPENDENT_AMBULATORY_CARE_PROVIDER_SITE_OTHER): Payer: BC Managed Care – PPO

## 2020-06-27 VITALS — BP 122/82 | HR 96 | Ht 70.0 in | Wt 222.0 lb

## 2020-06-27 DIAGNOSIS — Z09 Encounter for follow-up examination after completed treatment for conditions other than malignant neoplasm: Secondary | ICD-10-CM

## 2020-06-27 DIAGNOSIS — R9389 Abnormal findings on diagnostic imaging of other specified body structures: Secondary | ICD-10-CM | POA: Diagnosis not present

## 2020-06-27 DIAGNOSIS — E1110 Type 2 diabetes mellitus with ketoacidosis without coma: Secondary | ICD-10-CM | POA: Diagnosis not present

## 2020-06-27 NOTE — Progress Notes (Signed)
Patient presented for Freestyle libre placement and instruction. Per Providers order from 06-26-20. Patient voiced no concerns nor showed signs of distress during placement. Also instructed patient on how to record and give access to libre view so we can have accurate monitoring system. Instructed patient that if further questions, they can call clinic to be given further direction. 

## 2020-06-27 NOTE — Progress Notes (Signed)
Done

## 2020-06-27 NOTE — Progress Notes (Signed)
Wyline Mood MD, MRCP(U.K) [address]  [address]  [place], [place] [address]  Main: [phone]  Fax: [phone]   Gastroenterology Consultation  Referring Provider:     Allegra Grana, FNP Primary Care Physician:  Allegra Grana, FNP Primary Gastroenterologist:  Dr. Wyline Mood  Reason for Consultation: Hospital follow-up        HPI:   Julia Brown is a 50 y.o. y/o female referred for consultation & management  by  Allegra Grana, FNP.    She was recently admitted to the hospital on 06/19/2020 for nausea vomiting and abdominal pain.  Symptoms began on the morning of admission after eating popcorn.  She was diagnosed with diabetic ketoacidosis.  She had a CT scan of the abdomen that showed enlargement of the ileocecal valve which protrudes into the cecal lumen.  She also had a barium swallow at the same time which showed no esophageal dysmotility.  She subsequently underwent an upper endoscopy by Dr. Norma Fredrickson on 06/22/2020 showed some erythema in the entire stomach and biopsies were taken.  It showed no abnormalities. She has a colonoscopy scheduled with me in May 2022.   Since discharge has had no issues with nausea vomiting or any discomfort.No change in bowel habits.  No family history of colon cancer or polyps.  No rectal bleeding.  No weight loss.  Past Medical History:  Diagnosis Date  . Chicken pox   . Diabetes mellitus   . GERD (gastroesophageal reflux disease)   . Hyperlipidemia   . Hypertension   . UTI (lower urinary tract infection)     Past Surgical History:  Procedure Laterality Date  . ABLATION  2007   excessive bleeding  . CESAREAN SECTION  1994  . CHOLECYSTECTOMY  1993  . ESOPHAGOGASTRODUODENOSCOPY N/A 06/22/2020   Procedure: ESOPHAGOGASTRODUODENOSCOPY (EGD);  Surgeon: Toledo, Boykin Nearing, MD;  Location: [facility] ENDOSCOPY;  Service: Gastroenterology;  Laterality: N/A;  . ORIF FEMUR FRACTURE Right    [place]  . TUBAL LIGATION    . uterine  ablation      Prior to Admission medications   Medication Sig Start Date End Date Taking? Authorizing Provider  amLODipine (NORVASC) 5 MG tablet Take 1 tablet (5 mg total) by mouth daily. 12/25/19   Allegra Grana, FNP  azelastine (ASTELIN) 0.1 % nasal spray Place 1 spray into the nose 2 (two) times daily as needed for rhinitis or allergies.    [provider]  carvedilol (COREG) 3.125 MG tablet TAKE 1 TABLET BY MOUTH TWICE DAILY WITH A MEAL Patient taking differently: Take 3.125 mg by mouth 2 (two) times daily with a meal. TAKE 1 TABLET BY MOUTH TWICE DAILY WITH A MEAL 06/05/19   Allegra Grana, FNP  Continuous Blood Gluc Sensor (FREESTYLE LIBRE 2 SENSOR) MISC Place once sensor every 14 days. 06/26/20   Allegra Grana, FNP  Insulin Glargine (BASAGLAR KWIKPEN) 100 UNIT/ML Inject 43 Units into the skin in the morning. 06/26/20   Allegra Grana, FNP  metFORMIN (GLUCOPHAGE-XR) 500 MG 24 hr tablet Take 2 tablets (1,000 mg total) by mouth 2 (two) times daily with a meal. 06/26/20   Arnett, Lyn Records, FNP  metoCLOPramide (REGLAN) 10 MG tablet Take 1 tablet (10 mg total) by mouth every 8 (eight) hours as needed for up to 4 days for nausea or vomiting. 06/23/20 06/27/20  Lurene Shadow, MD  Multiple Vitamins-Minerals (MULTIVITAMIN WITH MINERALS) tablet Take 1 tablet by mouth daily.    [provider]  pantoprazole (PROTONIX) 40 MG tablet Take 1 tablet (40 mg total) by mouth daily. 06/23/20   Lurene Shadow, MD  quinapril (ACCUPRIL) 40 MG tablet Take 0.5 tablets (20 mg total) by mouth daily. 06/26/20   Allegra Grana, FNP  rosuvastatin (CRESTOR) 20 MG tablet Take 1 tablet (20 mg total) by mouth every other day. 07/10/19   Allegra Grana, FNP  vitamin B-12 (CYANOCOBALAMIN) 1000 MCG tablet Take 1,000 mcg by mouth daily.    [provider]    Family History  Problem Relation Age of Onset  . Heart disease Father   . Hyperlipidemia Father   . Diabetes Father   .  Hyperlipidemia Mother   . Hypertension Mother   . Schizophrenia Mother   . Depression Mother   . Diabetes Mother   . Heart disease Other   . Cancer Maternal Aunt        breast  . Diabetes Maternal Grandmother   . Diabetes Maternal Grandfather   . Thyroid cancer Neg Hx      Social History   Tobacco Use  . Smoking status: Current Every Day Smoker    Packs/day: 0.50    Years: 10.00    Pack years: 5.00    Types: Cigarettes  . Smokeless tobacco: Never Used  . Tobacco comment: smoked < 1 pack a day  Vaping Use  . Vaping Use: Never used  Substance Use Topics  . Alcohol use: Yes    Alcohol/week: 0.0 standard drinks    Comment: weekend 3-4drinks  . Drug use: No    Allergies as of 06/27/2020 - Review Complete 06/22/2020  Allergen Reaction Noted  . Contrast media [iodinated diagnostic agents] Anaphylaxis 01/28/2019  . Statins Palpitations 01/22/2012  . Jardiance [empagliflozin]  06/26/2020  . Bactrim [sulfamethoxazole-trimethoprim] Itching 07/26/2018  . Penicillins Rash 01/22/2012  . Trulicity [dulaglutide] Nausea Only 09/05/2018    Review of Systems:    All systems reviewed and negative except where noted in HPI.   Physical Exam:  There were no vitals taken for this visit. No LMP recorded. Patient has had an ablation. Psych:  Alert and cooperative. Normal mood and affect. General:   Alert,  Well-developed, well-nourished, pleasant and cooperative in NAD Head:  Normocephalic and atraumatic. Eyes:  Sclera clear, no icterus.   Conjunctiva pink. Lungs:  Respirations even and unlabored.  Clear throughout to auscultation.   No wheezes, crackles, or rhonchi. No acute distress. Heart:  Regular rate and rhythm; no murmurs, clicks, rubs, or gallops. Abdomen:  Normal bowel sounds.  No bruits.  Soft, non-tender and non-distended without masses, hepatosplenomegaly or hernias noted.  No guarding or rebound tenderness.    Neurologic:  Alert and oriented x3;  grossly normal  neurologically. Psych:  Alert and cooperative. Normal mood and affect.  Imaging Studies: CT ABDOMEN PELVIS WO CONTRAST  Result Date: 06/19/2020 CLINICAL DATA:  Acute, nonlocalized abdominal pain, nausea, vomiting EXAM: CT ABDOMEN AND PELVIS WITHOUT CONTRAST TECHNIQUE: Multidetector CT imaging of the abdomen and pelvis was performed following the standard protocol without IV contrast. COMPARISON:  None. FINDINGS: Lower chest: The visualized lung bases are clear bilaterally. Visualized heart and pericardium are unremarkable. Hepatobiliary: No focal liver abnormality is seen. Status post cholecystectomy. No biliary dilatation. Pancreas: Unremarkable Spleen: Unremarkable Adrenals/Urinary Tract: Adrenal glands are unremarkable. Kidneys are normal, without renal calculi, focal lesion, or hydronephrosis. Bladder is unremarkable. Stomach/Bowel: The ileocecal valve appears enlarged and protrudes into the colonic lumen, best noted on coronal imaging. This is  new since prior examination. There is no evidence of obstruction. This may relate to localized edema though an infiltrative mass could appear similarly. Mild cecal diverticulosis is also identified. No significant pericecal inflammatory change, however, is identified. The stomach, small bowel, and large bowel are otherwise unremarkable. Appendix normal. No free intraperitoneal gas or fluid. Vascular/Lymphatic: No significant vascular findings are present. No enlarged abdominal or pelvic lymph nodes. Reproductive: Uterus and bilateral adnexa are unremarkable. Other: Mild subcutaneous infiltration of the pannus is again identified possibly representing benign edema though inflammatory conditions, such as panniculitis, could appear in similar fashion. Musculoskeletal: Right hip pinning has been performed. No acute bone abnormality. No lytic or blastic bone lesions are identified. IMPRESSION: Enlargement of the ileocecal valve which protrudes into the cecal lumen. This  may relate to local edema or infiltration and correlation with the patient's recent screening endoscopy is recommended. No evidence of obstruction at this time. Electronically Signed   By: Helyn Numbers MD   On: 06/19/2020 07:13   DG ESOPHAGUS W DOUBLE CM (HD)  Result Date: 06/21/2020 CLINICAL DATA:  Nausea and vomiting accompanied by lower abdominal pain EXAM: ESOPHOGRAM/BARIUM SWALLOW TECHNIQUE: Single contrast examination was performed using thin barium or water soluble. FLUOROSCOPY TIME:  Fluoroscopy Time:  0.2 minute Radiation Exposure Index (if provided by the fluoroscopic device): 2.2 mGy Number of Acquired Spot Images: 0 COMPARISON:  None. FINDINGS: Patient is extremely nauseous. Moderate amount of stool in the transverse and proximal descending colon. Normal pharyngeal anatomy and motility. Contrast flowed freely through the esophagus without evidence of a stricture or mass. Normal esophageal mucosa without evidence of irregularity or ulceration. Esophageal motility was normal. Patient was unable to drink more than 1 mouthful of contrast due to nausea. IMPRESSION: 1. Patient was unable to drink more than 1 mouthful of contrast due to nausea. No esophageal abnormality. Electronically Signed   By: Elige Ko   On: 06/21/2020 14:31    Assessment and Plan:   SIYONA COTO is a 50 y.o. y/o female has been referred for hospital follow-up.  Admitted a week back to the hospital with acute nausea vomiting and abdominal pain.  Very likely this is secondary to the diabetic ketoacidosis that she presented with.  Upper endoscopy showed no gross abnormality except mild erythema of the stomach and biopsies showed no abnormality.  CAT scan showed possible prolapsing ileocecal valve which requires endoscopy evaluation.   Plan 1.  Proceed with colonoscopy which has been scheduled in May 2022.  We will evaluate the ileocecal valve which was abnormal on the CAT scan.  I will try and reschedule her colonoscopy  at an earlier date 2.  Abdominal pain nausea vomiting very likely to diabetic ketoacidosis which she was admitted with.  GI symptoms are very strongly associated with diabetic ketoacidosis.  Blood sugars checked yesterday was 255 which is also very elevated.  There is also a degree of gastroparesis associated with hyperglycemia.  Suggest tight glycemic control.  Presently has no abdominal symptoms.  Explained to her that if she has recurrence to consider checking her blood sugars which could be an early indicator of DKA.  I have discussed alternative options, risks & benefits,  which include, but are not limited to, bleeding, infection, perforation,respiratory complication & drug reaction.  The patient agrees with this plan & written consent will be obtained.     Follow up in as needed  Dr Wyline Mood MD,MRCP(U.K)

## 2020-06-28 ENCOUNTER — Other Ambulatory Visit: Payer: Self-pay

## 2020-06-28 DIAGNOSIS — I10 Essential (primary) hypertension: Secondary | ICD-10-CM

## 2020-06-28 MED ORDER — QUINAPRIL HCL 40 MG PO TABS: 20.0000 mg | ORAL_TABLET | Freq: Every day | ORAL | 1 refills | Status: DC

## 2020-06-28 MED ORDER — QUINAPRIL HCL 20 MG PO TABS: 20.0000 mg | ORAL_TABLET | Freq: Every day | ORAL | 3 refills | Status: DC

## 2020-06-28 NOTE — Progress Notes (Signed)
Pt has & I will be able to download Julia Brown next week when she has more readings.

## 2020-06-28 NOTE — Progress Notes (Signed)
Sent updated instructions to patient via my chart and mailed.

## 2020-06-30 ENCOUNTER — Other Ambulatory Visit: Payer: Self-pay | Admitting: Family

## 2020-07-01 ENCOUNTER — Other Ambulatory Visit: Payer: Self-pay | Admitting: Family

## 2020-07-01 ENCOUNTER — Encounter: Payer: Self-pay | Admitting: Family

## 2020-07-02 ENCOUNTER — Telehealth: Payer: Self-pay | Admitting: Family

## 2020-07-02 NOTE — Telephone Encounter (Signed)
Pt called to follow up on Message  Pt stated that her blood sugar keep raising

## 2020-07-02 NOTE — Telephone Encounter (Signed)
Spoke with pt She is taking basaglar 40 units, metformin 1000mg  BID.  Took basaglar 43 units today No hypoglycemic episodes. FGB 173, 174, 202, 215, 244 Post prandial breakfast 298, 257 Post prandial lunch 207, 207 Appetite is good. No nausea, abdominal pain Dr , concern for gastroparesis.   Plan Increase basaglar 43 qam.  Goal of FBG < 150 Hold asa due to gastritis seen on EGD 06/22/2020.  Continue metformin 1000mg  BID. Will avoid GLP -1 due to gastroparesis. Call me if FBG > 200.

## 2020-07-10 ENCOUNTER — Encounter: Payer: Self-pay | Admitting: Family

## 2020-07-10 ENCOUNTER — Ambulatory Visit: Payer: BC Managed Care – PPO | Admitting: Family

## 2020-07-10 ENCOUNTER — Other Ambulatory Visit: Payer: Self-pay

## 2020-07-10 VITALS — BP 136/80 | HR 81 | Temp 97.9°F | Ht 70.0 in | Wt 238.6 lb

## 2020-07-10 DIAGNOSIS — Z794 Long term (current) use of insulin: Secondary | ICD-10-CM | POA: Diagnosis not present

## 2020-07-10 DIAGNOSIS — I10 Essential (primary) hypertension: Secondary | ICD-10-CM

## 2020-07-10 DIAGNOSIS — E119 Type 2 diabetes mellitus without complications: Secondary | ICD-10-CM | POA: Diagnosis not present

## 2020-07-10 DIAGNOSIS — R350 Frequency of micturition: Secondary | ICD-10-CM | POA: Diagnosis not present

## 2020-07-10 MED ORDER — BASAGLAR KWIKPEN 100 UNIT/ML ~~LOC~~ SOPN: 30.0000 [IU] | PEN_INJECTOR | Freq: Every morning | SUBCUTANEOUS | 1 refills | Status: DC

## 2020-07-10 MED ORDER — RYBELSUS 3 MG PO TABS: 3.0000 mg | ORAL_TABLET | Freq: Every day | ORAL | 1 refills | Status: DC

## 2020-07-10 NOTE — Assessment & Plan Note (Addendum)
Uncontrolled. Consulted with Dr Darrick Huntsman and dr Tobi Bastos ( via staff message) regarding starting GLP 1, Rybelsus, ahead of colonoscopy and both in agreement that okay. Start rybelsus 3mg  and decrease lantus from 46 units to 30 units. Continue to remain of jardiance indefinitely with h/o DKA. She will closely monitor blood glucose during this process and decrease lantus as she approaches FBG 150. Close follow up.

## 2020-07-10 NOTE — Assessment & Plan Note (Signed)
Uncontrolled. Improved as she resting in exam room. Recent increase of quinlapril and we agreed to give a full 7 days.  Continue quinlapril  40mg  ,coreg 3.125mg  bid, amlodipine 5mg .

## 2020-07-10 NOTE — Patient Instructions (Addendum)
Keep glucose tablets with you.   Please start  rybelsus and if you have abdominal pain or nausea, please let me know  I have sent a note to Dr Tobi Bastos as well  Decrease lantus to 30 units daily . As fasting blood sugars approach 150, please call the office so we can further DECREASE the basal insulin.

## 2020-07-10 NOTE — Progress Notes (Signed)
Subjective:    Patient ID: Julia Brown, female    DOB: 06/16/1970, 50 y.o.   MRN: [id number]  CC: Julia Brown is a 50 y.o. female who presents today for follow up.   HPI: Feels well today.  No new concerns. DM- compliant with basaglar 46 qam, metformin 1000mg  BID.  Average glucose 214, variability 19.4%. No hypoglycemic episodes. She continues to endorse urinary frequency. No dysuria, fever, flank pain, nausea.   Blood sugar elevated 2 hours after after breakfast and 2 hours of lunch. Eating grapes or pita chips as snacks in between.  She is wearing CGM 80% of the time.   HTN- she increased quinlapril from 20mg  from 40mg  3 days ago. Compliant with coreg 3.125mg  bid, amlodipine 5mg . No cp.    She is no longer on ASA 81mg     HISTORY:  Past Medical History:  Diagnosis Date  . Chicken pox   . Diabetes mellitus   . GERD (gastroesophageal reflux disease)   . Hyperlipidemia   . Hypertension   . UTI (lower urinary tract infection)    Past Surgical History:  Procedure Laterality Date  . ABLATION  2007   excessive bleeding  . CESAREAN SECTION  1994  . CHOLECYSTECTOMY  1993  . ESOPHAGOGASTRODUODENOSCOPY N/A 06/22/2020   Procedure: ESOPHAGOGASTRODUODENOSCOPY (EGD);  Surgeon: Toledo, , MD;  Location: [facility] ENDOSCOPY;  Service: Gastroenterology;  Laterality: N/A;  . ORIF FEMUR FRACTURE Right    [place]  . TUBAL LIGATION    . uterine ablation     Family History  Problem Relation Age of Onset  . Heart disease Father   . Hyperlipidemia Father   . Diabetes Father   . Hyperlipidemia Mother   . Hypertension Mother   . Schizophrenia Mother   . Depression Mother   . Diabetes Mother   . Heart disease Other   . Cancer Maternal Aunt        breast  . Diabetes Maternal Grandmother   . Diabetes Maternal Grandfather   . Thyroid cancer Neg Hx     Allergies: Contrast media [iodinated diagnostic agents], Statins, Jardiance [empagliflozin], Bactrim  [sulfamethoxazole-trimethoprim], Penicillins, and Trulicity [dulaglutide] Current Outpatient Medications on File Prior to Visit  Medication Sig Dispense Refill  . amLODipine (NORVASC) 5 MG tablet Take 1 tablet (5 mg total) by mouth daily. 30 tablet 5  . azelastine (ASTELIN) 0.1 % nasal spray Place 1 spray into the nose 2 (two) times daily as needed for rhinitis or allergies.    . carvedilol (COREG) 3.125 MG tablet TAKE 1 TABLET BY MOUTH TWICE DAILY WITH A MEAL (Patient taking differently: Take 3.125 mg by mouth 2 (two) times daily with a meal. TAKE 1 TABLET BY MOUTH TWICE DAILY WITH A MEAL) 180 tablet 6  . Continuous Blood Gluc Sensor (FREESTYLE LIBRE 2 SENSOR) MISC Place once sensor every 14 days. 2 each 2  . metFORMIN (GLUCOPHAGE-XR) 500 MG 24 hr tablet Take 2 tablets (1,000 mg total) by mouth 2 (two) times daily with a meal. 360 tablet 1  . metoCLOPramide (REGLAN) 10 MG tablet Take 1 tablet (10 mg total) by mouth every 8 (eight) hours as needed for up to 4 days for nausea or vomiting. 12 tablet 0  . Multiple Vitamins-Minerals (MULTIVITAMIN WITH MINERALS) tablet Take 1 tablet by mouth daily.    . pantoprazole (PROTONIX) 40 MG tablet Take 1 tablet (40 mg total) by mouth daily. 30 tablet 0  . quinapril (ACCUPRIL) 20 MG tablet Take 1  tablet (20 mg total) by mouth at bedtime. (Patient taking differently: Take 40 mg by mouth at bedtime.) 30 tablet 3  . rosuvastatin (CRESTOR) 20 MG tablet Take 1 tablet (20 mg total) by mouth every other day. 45 tablet 3  . vitamin B-12 (CYANOCOBALAMIN) 1000 MCG tablet Take 1,000 mcg by mouth daily.    Marland Kitchen amLODipine (NORVASC) 5 MG tablet Take 1 tablet (5 mg total) by mouth daily. (Patient not taking: Reported on 07/10/2020) 30 tablet 5   No current facility-administered medications on file prior to visit.    Social History   Tobacco Use  . Smoking status: Current Every Day Smoker    Packs/day: 0.50    Years: 10.00    Pack years: 5.00    Types: Cigarettes  .  Smokeless tobacco: Never Used  . Tobacco comment: smoked < 1 pack a day  Vaping Use  . Vaping Use: Never used  Substance Use Topics  . Alcohol use: Yes    Alcohol/week: 0.0 standard drinks    Comment: weekend 3-4drinks  . Drug use: No    Review of Systems  Constitutional: Negative for chills and fever.  Respiratory: Negative for cough.   Cardiovascular: Negative for chest pain and palpitations.  Gastrointestinal: Negative for nausea and vomiting.  Endocrine: Positive for polyuria.  Genitourinary: Negative for dysuria.      Objective:    BP 136/80   Pulse 81   Temp 97.9 F (36.6 C)   Ht 5\' 10"  (1.778 m)   Wt 238 lb 9.6 oz (108.2 kg)   SpO2 99%   BMI 34.24 kg/m  BP Readings from Last 3 Encounters:  07/10/20 136/80  06/27/20 122/82  06/26/20 (!) 88/56   Wt Readings from Last 3 Encounters:  07/10/20 238 lb 9.6 oz (108.2 kg)  06/27/20 222 lb (100.7 kg)  06/26/20 219 lb 12.8 oz (99.7 kg)    Physical Exam Vitals reviewed.  Constitutional:      Appearance: She is well-developed.  Eyes:     Conjunctiva/sclera: Conjunctivae normal.  Cardiovascular:     Rate and Rhythm: Normal rate and regular rhythm.     Pulses: Normal pulses.     Heart sounds: Normal heart sounds.  Pulmonary:     Effort: Pulmonary effort is normal.     Breath sounds: Normal breath sounds. No wheezing, rhonchi or rales.  Skin:    General: Skin is warm and dry.  Neurological:     Mental Status: She is alert.  Psychiatric:        Speech: Speech normal.        Behavior: Behavior normal.        Thought Content: Thought content normal.        Assessment & Plan:   Problem List Items Addressed This Visit      Cardiovascular and Mediastinum   Essential hypertension (Chronic)    Uncontrolled. Improved as she resting in exam room. Recent increase of quinlapril and we agreed to give a full 7 days.  Continue quinlapril  40mg  ,coreg 3.125mg  bid, amlodipine 5mg .        Endocrine   DM (diabetes  mellitus) (HCC)    Uncontrolled. Consulted with Dr 08/26/20 and dr ( via staff message) regarding starting GLP 1, Rybelsus, ahead of colonoscopy and both in agreement that okay. Start rybelsus 3mg  and decrease lantus from 46 units to 30 units. Continue to remain of jardiance indefinitely with h/o DKA. She will closely monitor blood glucose during this process  and decrease lantus as she approaches FBG 150. Close follow up.       Relevant Medications   Semaglutide (RYBELSUS) 3 MG TABS   Insulin Glargine (BASAGLAR KWIKPEN) 100 UNIT/ML   Other Relevant Orders   Comprehensive metabolic panel   CBC with Differential/Platelet    Other Visit Diagnoses    Urinary frequency    -  Primary   Relevant Orders   Urinalysis, Routine w reflex microscopic   Urine Culture   Controlled type 2 diabetes mellitus without complication, without long-term current use of insulin (HCC)       Relevant Medications   Semaglutide (RYBELSUS) 3 MG TABS   Insulin Glargine (BASAGLAR KWIKPEN) 100 UNIT/ML       I have changed Daliah T. Bubar's Basaglar KwikPen. I am also having her start on Rybelsus. Additionally, I am having her maintain her carvedilol, rosuvastatin, azelastine, vitamin B-12, multivitamin with minerals, pantoprazole, metoCLOPramide, FreeStyle Libre 2 Sensor, metFORMIN, quinapril, amLODipine, and amLODipine.   Meds ordered this encounter  Medications  . Semaglutide (RYBELSUS) 3 MG TABS    Sig: Take 3 mg by mouth daily.    Dispense:  30 tablet    Refill:  1    Order Specific Question:   Supervising Provider    Answer:   Duncan Dull L [[id number]]  . Insulin Glargine (BASAGLAR KWIKPEN) 100 UNIT/ML    Sig: Inject 30 Units into the skin in the morning.    Dispense:  60 mL    Refill:  1    Order Specific Question:   Supervising Provider    Answer:   Sherlene Shams [[id number]]    Return precautions given.   Risks, benefits, and alternatives of the medications and treatment plan prescribed today were  discussed, and patient expressed understanding.   Education regarding symptom management and diagnosis given to patient on AVS.  Continue to follow with Allegra Grana, FNP for routine health maintenance.   [name] and I agreed with plan.   Rennie Plowman, FNP

## 2020-07-11 LAB — COMPREHENSIVE METABOLIC PANEL
ALT: 17 U/L (ref 0–35)
AST: 11 U/L (ref 0–37)
Albumin: 4.1 g/dL (ref 3.5–5.2)
Alkaline Phosphatase: 50 U/L (ref 39–117)
BUN: 18 mg/dL (ref 6–23)
CO2: 30 mEq/L (ref 19–32)
Calcium: 9.7 mg/dL (ref 8.4–10.5)
Chloride: 99 mEq/L (ref 96–112)
Creatinine, Ser: 0.88 mg/dL (ref 0.40–1.20)
GFR: 77.33 mL/min (ref >60.00)
Glucose, Bld: 178 mg/dL — ABNORMAL HIGH (ref 70–99)
Potassium: 4.2 mEq/L (ref 3.5–5.1)
Sodium: 135 mEq/L (ref 135–145)
Total Bilirubin: 0.4 mg/dL (ref 0.2–1.2)
Total Protein: 6.4 g/dL (ref 6.0–8.3)

## 2020-07-11 LAB — URINE CULTURE
MICRO NUMBER:: 11792587
SPECIMEN QUALITY:: ADEQUATE

## 2020-07-11 LAB — CBC WITH DIFFERENTIAL/PLATELET
Basophils Absolute: 0.1 10*3/uL (ref 0.0–0.1)
Basophils Relative: 0.9 % (ref 0.0–3.0)
Eosinophils Absolute: 0.2 10*3/uL (ref 0.0–0.7)
Eosinophils Relative: 2.1 % (ref 0.0–5.0)
HCT: 37.3 % (ref 36.0–46.0)
Hemoglobin: 12.4 g/dL (ref 12.0–15.0)
Lymphocytes Relative: 24.8 % (ref 12.0–46.0)
Lymphs Abs: 2.5 10*3/uL (ref 0.7–4.0)
MCHC: 33.2 g/dL (ref 30.0–36.0)
MCV: 90.5 fl (ref 78.0–100.0)
Monocytes Absolute: 0.7 10*3/uL (ref 0.1–1.0)
Monocytes Relative: 6.9 % (ref 3.0–12.0)
Neutro Abs: 6.5 10*3/uL (ref 1.4–7.7)
Neutrophils Relative %: 65.3 % (ref 43.0–77.0)
Platelets: 276 10*3/uL (ref 150.0–400.0)
RBC: 4.12 Mil/uL (ref 3.87–5.11)
RDW: 12.5 % (ref 11.5–15.5)
WBC: 9.9 10*3/uL (ref 4.0–10.5)

## 2020-07-11 LAB — URINALYSIS, ROUTINE W REFLEX MICROSCOPIC
Bilirubin Urine: NEGATIVE
Hgb urine dipstick: NEGATIVE
Ketones, ur: NEGATIVE
Leukocytes,Ua: NEGATIVE
Nitrite: NEGATIVE
RBC / HPF: NONE SEEN (ref 0–?)
Specific Gravity, Urine: <=1.005 — AB (ref 1.000–1.030)
Total Protein, Urine: NEGATIVE
Urine Glucose: 100 — AB
Urobilinogen, UA: 0.2 (ref 0.0–1.0)
WBC, UA: NONE SEEN (ref 0–?)
pH: 6 (ref 5.0–8.0)

## 2020-07-15 ENCOUNTER — Encounter: Payer: Self-pay | Admitting: Family

## 2020-07-16 ENCOUNTER — Other Ambulatory Visit: Payer: Self-pay | Admitting: Family

## 2020-07-16 ENCOUNTER — Other Ambulatory Visit: Payer: Self-pay

## 2020-07-16 MED ORDER — PEG 3350-KCL-NA BICARB-NACL 420 G PO SOLR: ORAL | 0 refills | Status: DC

## 2020-07-17 ENCOUNTER — Other Ambulatory Visit: Payer: Self-pay

## 2020-07-18 MED ORDER — PANTOPRAZOLE SODIUM 40 MG PO TBEC: 40.0000 mg | DELAYED_RELEASE_TABLET | Freq: Every day | ORAL | 1 refills | Status: DC

## 2020-07-18 NOTE — Telephone Encounter (Signed)
Gastritis seen EGD  Continue protonix 40mg 

## 2020-07-23 ENCOUNTER — Other Ambulatory Visit: Payer: Self-pay

## 2020-07-23 ENCOUNTER — Encounter: Payer: Self-pay | Admitting: Family

## 2020-07-24 ENCOUNTER — Encounter: Payer: Self-pay | Admitting: Gastroenterology

## 2020-07-25 ENCOUNTER — Encounter: Payer: Self-pay | Admitting: Gastroenterology

## 2020-07-25 ENCOUNTER — Ambulatory Visit: Payer: BC Managed Care – PPO | Admitting: Anesthesiology

## 2020-07-25 ENCOUNTER — Ambulatory Visit
Admission: RE | Admit: 2020-07-25 | Discharge: 2020-07-25 | Disposition: A | Discharge status: Home / Self Care | Payer: BC Managed Care – PPO | Attending: Gastroenterology | Admitting: Gastroenterology

## 2020-07-25 ENCOUNTER — Encounter
Admission: RE | Disposition: A | Discharge status: Home / Self Care | Payer: Self-pay | Source: Home / Self Care | Attending: Gastroenterology

## 2020-07-25 DIAGNOSIS — Z880 Allergy status to penicillin: Secondary | ICD-10-CM | POA: Insufficient documentation

## 2020-07-25 DIAGNOSIS — K219 Gastro-esophageal reflux disease without esophagitis: Secondary | ICD-10-CM | POA: Insufficient documentation

## 2020-07-25 DIAGNOSIS — Z79899 Other long term (current) drug therapy: Secondary | ICD-10-CM | POA: Insufficient documentation

## 2020-07-25 DIAGNOSIS — Z833 Family history of diabetes mellitus: Secondary | ICD-10-CM | POA: Insufficient documentation

## 2020-07-25 DIAGNOSIS — Z8249 Family history of ischemic heart disease and other diseases of the circulatory system: Secondary | ICD-10-CM | POA: Diagnosis not present

## 2020-07-25 DIAGNOSIS — Z803 Family history of malignant neoplasm of breast: Secondary | ICD-10-CM | POA: Diagnosis not present

## 2020-07-25 DIAGNOSIS — Z7984 Long term (current) use of oral hypoglycemic drugs: Secondary | ICD-10-CM | POA: Diagnosis not present

## 2020-07-25 DIAGNOSIS — I10 Essential (primary) hypertension: Secondary | ICD-10-CM | POA: Insufficient documentation

## 2020-07-25 DIAGNOSIS — Z1211 Encounter for screening for malignant neoplasm of colon: Secondary | ICD-10-CM

## 2020-07-25 DIAGNOSIS — Z888 Allergy status to other drugs, medicaments and biological substances status: Secondary | ICD-10-CM | POA: Diagnosis not present

## 2020-07-25 DIAGNOSIS — R933 Abnormal findings on diagnostic imaging of other parts of digestive tract: Secondary | ICD-10-CM | POA: Insufficient documentation

## 2020-07-25 DIAGNOSIS — Z87891 Personal history of nicotine dependence: Secondary | ICD-10-CM | POA: Diagnosis not present

## 2020-07-25 DIAGNOSIS — Z881 Allergy status to other antibiotic agents status: Secondary | ICD-10-CM | POA: Diagnosis not present

## 2020-07-25 DIAGNOSIS — Z8349 Family history of other endocrine, nutritional and metabolic diseases: Secondary | ICD-10-CM | POA: Diagnosis not present

## 2020-07-25 DIAGNOSIS — E119 Type 2 diabetes mellitus without complications: Secondary | ICD-10-CM | POA: Diagnosis not present

## 2020-07-25 HISTORY — PX: COLONOSCOPY WITH PROPOFOL: SHX5780

## 2020-07-25 LAB — POCT PREGNANCY, URINE: Preg Test, Ur: NEGATIVE

## 2020-07-25 LAB — GLUCOSE, CAPILLARY: Glucose-Capillary: 178 mg/dL — ABNORMAL HIGH (ref 70–99)

## 2020-07-25 SURGERY — COLONOSCOPY WITH PROPOFOL
Anesthesia: General

## 2020-07-25 MED ORDER — PROPOFOL 10 MG/ML IV BOLUS
INTRAVENOUS | Status: DC | PRN
  Administered 2020-07-25: 80 mg via INTRAVENOUS

## 2020-07-25 MED ORDER — GLYCOPYRROLATE 0.2 MG/ML IJ SOLN
INTRAMUSCULAR | Status: AC
  Filled 2020-07-25: qty 1

## 2020-07-25 MED ORDER — SODIUM CHLORIDE 0.9 % IV SOLN: INTRAVENOUS | Status: DC

## 2020-07-25 MED ORDER — GLYCOPYRROLATE 0.2 MG/ML IJ SOLN
INTRAMUSCULAR | Status: DC | PRN
  Administered 2020-07-25: .2 mg via INTRAVENOUS

## 2020-07-25 MED ORDER — PROPOFOL 500 MG/50ML IV EMUL
INTRAVENOUS | Status: DC | PRN
  Administered 2020-07-25: 150 ug/kg/min via INTRAVENOUS

## 2020-07-25 MED ORDER — PROPOFOL 500 MG/50ML IV EMUL
INTRAVENOUS | Status: AC
  Filled 2020-07-25: qty 100

## 2020-07-25 MED ORDER — LIDOCAINE HCL (CARDIAC) PF 100 MG/5ML IV SOSY
PREFILLED_SYRINGE | INTRAVENOUS | Status: DC | PRN
  Administered 2020-07-25: 100 mg via INTRAVENOUS

## 2020-07-25 NOTE — Op Note (Signed)
[facility] Gastroenterology Patient Name: Julia Brown Procedure Date: 07/25/2020 7:54 AM MRN: [id number] Account #: [id number] Date of Birth: [birth year] Admit Type: Outpatient Age: 50 Room: [facility] ENDO ROOM 3 Gender: Female Note Status: Finalized Procedure:             Colonoscopy Indications:           Abnormal CT of the GI tract Providers:             Wyline Mood MD, MD Referring MD:          Lyn Records. Arnett (Referring MD) Medicines:             Monitored Anesthesia Care Complications:         No immediate complications. Procedure:             Pre-Anesthesia Assessment:                        - Prior to the procedure, a History and Physical was                         performed, and patient medications, allergies and                         sensitivities were reviewed. The patient's tolerance                         of previous anesthesia was reviewed.                        - The risks and benefits of the procedure and the                         sedation options and risks were discussed with the                         patient. All questions were answered and informed                         consent was obtained.                        - ASA Grade Assessment: II - A patient with mild                         systemic disease.                        After obtaining informed consent, the colonoscope was                         passed under direct vision. Throughout the procedure,                         the patient's blood pressure, pulse, and oxygen                         saturations were monitored continuously. The                         Colonoscope was introduced through the anus  and                         advanced to the the terminal ileum. The colonoscopy                         was performed with ease. The patient tolerated the                         procedure well. The quality of the bowel preparation                         was  excellent. Findings:      The terminal ileum appeared normal.      The entire examined colon appeared normal on direct and retroflexion       views. Impression:            - The examined portion of the ileum was normal.                        - The entire examined colon is normal on direct and                         retroflexion views.                        - No specimens collected. Recommendation:        - Discharge patient to home (with escort).                        - Resume previous diet.                        - Continue present medications.                        - Repeat colonoscopy in 10 years for screening                         purposes. Procedure Code(s):     --- Professional ---                        [phone], Colonoscopy, flexible; diagnostic, including                         collection of specimen(s) by brushing or washing, when                         performed (separate procedure) Diagnosis Code(s):     --- Professional ---                        R93.3, Abnormal findings on diagnostic imaging of                         other parts of digestive tract CPT copyright 2019 American Medical Association. All rights reserved. The codes documented in this report are preliminary and upon coder review may  be revised to meet current compliance requirements. Wyline Mood, MD Wyline Mood MD, MD 07/25/2020 8:19:44 AM This report has been signed electronically. Number  of Addenda: 0 Note Initiated On: 07/25/2020 7:54 AM Scope Withdrawal Time: 0 hours 14 minutes 44 seconds  Total Procedure Duration: 0 hours 18 minutes 15 seconds  Estimated Blood Loss:  Estimated blood loss: none.      [facility]

## 2020-07-25 NOTE — Anesthesia Preprocedure Evaluation (Signed)
Anesthesia Evaluation  Patient identified by MRN, date of birth, ID band Patient awake  General Assessment Comment:Intractable nausea and vomiting, dry heaving with barf bag on preop eval  Reviewed: Allergy & Precautions, NPO status , Patient's Chart, lab work & pertinent test results  History of Anesthesia Complications Negative for: history of anesthetic complications  Airway Mallampati: III  TM Distance: >3 FB Neck ROM: Full    Dental no notable dental hx. (+) Teeth Intact   Pulmonary neg sleep apnea, neg COPD, Current Smoker and Patient abstained from smoking., former smoker,    Pulmonary exam normal breath sounds clear to auscultation       Cardiovascular Exercise Tolerance: Good METShypertension, (-) CAD and (-) Past MI (-) dysrhythmias  Rhythm:Regular Rate:Normal - Systolic murmurs    Neuro/Psych PSYCHIATRIC DISORDERS Anxiety negative neurological ROS     GI/Hepatic GERD  ,(+)     (-) substance abuse  ,   Endo/Other  diabetes, Poorly Controlled  Renal/GU negative Renal ROS     Musculoskeletal   Abdominal   Peds  Hematology   Anesthesia Other Findings Past Medical History: No date: Chicken pox No date: Diabetes mellitus No date: GERD (gastroesophageal reflux disease) No date: Hyperlipidemia No date: Hypertension No date: UTI (lower urinary tract infection)  Reproductive/Obstetrics                             Anesthesia Physical  Anesthesia Plan  ASA: III  Anesthesia Plan: General   Post-op Pain Management:    Induction: Intravenous  PONV Risk Score and Plan: Propofol infusion  Airway Management Planned: Nasal Cannula  Additional Equipment: None  Intra-op Plan:   Post-operative Plan:   Informed Consent: I have reviewed the patients History and Physical, chart, labs and discussed the procedure including the risks, benefits and alternatives for the proposed  anesthesia with the patient or authorized representative who has indicated his/her understanding and acceptance.     Dental advisory given  Plan Discussed with: CRNA and Surgeon  Anesthesia Plan Comments:         Anesthesia Quick Evaluation

## 2020-07-25 NOTE — Transfer of Care (Signed)
Immediate Anesthesia Transfer of Care Note  Patient: Julia Brown  Procedure(s) Performed: COLONOSCOPY WITH PROPOFOL (N/A )  Patient Location: PACU  Anesthesia Type:General  Level of Consciousness: sedated  Airway & Oxygen Therapy: Patient Spontanous Breathing and Patient connected to nasal cannula oxygen  Post-op Assessment: Report given to RN and Post -op Vital signs reviewed and stable  Post vital signs: Reviewed and stable  Last Vitals:  Vitals Value Taken Time  BP 101/72 07/25/20 0820  Temp 35.8 C 07/25/20 0819  Pulse 92 07/25/20 0820  Resp 15 07/25/20 0820  SpO2 100 % 07/25/20 0820  Vitals shown include unvalidated device data.  Last Pain:  Vitals:   07/25/20 0819  TempSrc: Temporal  PainSc: 0-No pain         Complications: No complications documented.

## 2020-07-25 NOTE — H&P (Signed)
Wyline Mood, MD [address], [place], [place], [address] [address], [place], [place], [address] Phone: [phone]  Fax: [phone]  Primary Care Physician:  Allegra Grana, FNP   Pre-Procedure History & Physical: HPI:  Julia Brown is a 50 y.o. female is here for an colonoscopy.   Past Medical History:  Diagnosis Date  . Chicken pox   . Diabetes mellitus   . GERD (gastroesophageal reflux disease)   . Hyperlipidemia   . Hypertension   . UTI (lower urinary tract infection)     Past Surgical History:  Procedure Laterality Date  . ABLATION  2007   excessive bleeding  . CESAREAN SECTION  1994  . CHOLECYSTECTOMY  1993  . ESOPHAGOGASTRODUODENOSCOPY N/A 06/22/2020   Procedure: ESOPHAGOGASTRODUODENOSCOPY (EGD);  Surgeon: Toledo, Boykin Nearing, MD;  Location: [facility] ENDOSCOPY;  Service: Gastroenterology;  Laterality: N/A;  . ORIF FEMUR FRACTURE Right    [place]  . TUBAL LIGATION    . uterine ablation      Prior to Admission medications   Medication Sig Start Date End Date Taking? Authorizing Provider  amLODipine (NORVASC) 5 MG tablet Take 1 tablet (5 mg total) by mouth daily. 07/01/20  Yes Arnett, Lyn Records, FNP  carvedilol (COREG) 3.125 MG tablet TAKE 1 TABLET BY MOUTH TWICE DAILY WITH A MEAL Patient taking differently: Take 3.125 mg by mouth 2 (two) times daily with a meal. TAKE 1 TABLET BY MOUTH TWICE DAILY WITH A MEAL 06/05/19  Yes Allegra Grana, FNP  metFORMIN (GLUCOPHAGE-XR) 500 MG 24 hr tablet Take 2 tablets (1,000 mg total) by mouth 2 (two) times daily with a meal. 06/26/20  Yes Arnett, Lyn Records, FNP  amLODipine (NORVASC) 5 MG tablet Take 1 tablet (5 mg total) by mouth daily. Patient not taking: Reported on 07/10/2020 07/01/20   Allegra Grana, FNP  azelastine (ASTELIN) 0.1 % nasal spray Place 1 spray into the nose 2 (two) times daily as needed for rhinitis or allergies.    [provider]  Continuous Blood Gluc Sensor  (FREESTYLE LIBRE 2 SENSOR) MISC Place once sensor every 14 days. 06/26/20   Allegra Grana, FNP  Insulin Glargine (BASAGLAR KWIKPEN) 100 UNIT/ML Inject 30 Units into the skin in the morning. 07/10/20   Allegra Grana, FNP  metoCLOPramide (REGLAN) 10 MG tablet Take 1 tablet (10 mg total) by mouth every 8 (eight) hours as needed for up to 4 days for nausea or vomiting. 06/23/20 06/27/20  Lurene Shadow, MD  Multiple Vitamins-Minerals (MULTIVITAMIN WITH MINERALS) tablet Take 1 tablet by mouth daily.    [provider]  pantoprazole (PROTONIX) 40 MG tablet Take 1 tablet (40 mg total) by mouth daily. 07/18/20   Allegra Grana, FNP  polyethylene glycol-electrolytes (NULYTELY) 420 g solution At 5pm evening before procedure, fill to the fill line indicated on container with a clear liquid. Mix very well. Drink 8 oz every 20-30 minutes until entire solution has been completed. 07/16/20   Wyline Mood, MD  quinapril (ACCUPRIL) 20 MG tablet Take 1 tablet (20 mg total) by mouth at bedtime. Patient taking differently: Take 40 mg by mouth at bedtime. 06/28/20   Allegra Grana, FNP  rosuvastatin (CRESTOR) 20 MG tablet Take 1 tablet (20 mg total) by mouth every other day. 07/16/20   Allegra Grana, FNP  Semaglutide (RYBELSUS) 3 MG TABS Take 3 mg by mouth daily. 07/10/20 08/09/20  Allegra Grana, FNP  vitamin B-12 (  CYANOCOBALAMIN) 1000 MCG tablet Take 1,000 mcg by mouth daily.    [provider]    Allergies as of 04/18/2020 - Review Complete 04/18/2020  Allergen Reaction Noted  . Contrast media [iodinated diagnostic agents] Anaphylaxis 01/28/2019  . Statins Palpitations 01/22/2012  . Bactrim [sulfamethoxazole-trimethoprim] Itching 07/26/2018  . Penicillins Rash 01/22/2012  . Trulicity [dulaglutide] Nausea Only 09/05/2018    Family History  Problem Relation Age of Onset  . Heart disease Father   . Hyperlipidemia Father   . Diabetes Father   . Hyperlipidemia Mother   .  Hypertension Mother   . Schizophrenia Mother   . Depression Mother   . Diabetes Mother   . Heart disease Other   . Cancer Maternal Aunt        breast  . Diabetes Maternal Grandmother   . Diabetes Maternal Grandfather   . Thyroid cancer Neg Hx     Social History   Socioeconomic History  . Marital status: Married    Spouse name: Not on file  . Number of children: Not on file  . Years of education: Not on file  . Highest education level: Not on file  Occupational History  . Not on file  Tobacco Use  . Smoking status: Former Smoker    Packs/day: 0.50    Years: 10.00    Pack years: 5.00    Types: Cigarettes    Quit date: 06/25/2020    Years since quitting: 0.0  . Smokeless tobacco: Never Used  . Tobacco comment: smoked < 1 pack a day  Vaping Use  . Vaping Use: Never used  Substance and Sexual Activity  . Alcohol use: Yes    Alcohol/week: 0.0 standard drinks    Comment: weekend 3-4drinks  . Drug use: No  . Sexual activity: Yes    Birth control/protection: Surgical    Comment: tubal ligation  Other Topics Concern  . Not on file  Social History Narrative   Lives in [place].      Work - [employer]      Diet - regular   Social Determinants of Corporate investment banker Strain: Not on file  Food Insecurity: Not on file  Transportation Needs: Not on file  Physical Activity: Not on file  Stress: Not on file  Social Connections: Not on file  Intimate Partner Violence: Not on file    Review of Systems: See HPI, otherwise negative ROS  Physical Exam: BP (!) 143/89   Pulse 81   Temp (!) 97.2 F (36.2 C) (Oral)   Resp 17   Ht 5\' 10"  (1.778 m)   Wt 108.4 kg   SpO2 100%   BMI 34.29 kg/m  General:   Alert,  pleasant and cooperative in NAD Head:  Normocephalic and atraumatic. Neck:  Supple; no masses or thyromegaly. Lungs:  Clear throughout to auscultation, normal respiratory effort.    Heart:  +S1, +S2, Regular rate and rhythm, No  edema. Abdomen:  Soft, nontender and nondistended. Normal bowel sounds, without guarding, and without rebound.   Neurologic:  Alert and  oriented x4;  grossly normal neurologically.  Impression/Plan: Julia Brown is here for an colonoscopy to be performed for abnormal ct scan of abdomen  Risks, benefits, limitations, and alternatives regarding  colonoscopy have been reviewed with the patient.  Questions have been answered.  All parties agreeable.   [name], MD  07/25/2020, 7:46 AM

## 2020-07-25 NOTE — Anesthesia Postprocedure Evaluation (Signed)
Anesthesia Post Note  Patient: Julia Brown  Procedure(s) Performed: COLONOSCOPY WITH PROPOFOL (N/A )  Patient location during evaluation: Endoscopy Anesthesia Type: General Level of consciousness: awake and alert and oriented Pain management: pain level controlled Vital Signs Assessment: post-procedure vital signs reviewed and stable Respiratory status: spontaneous breathing Cardiovascular status: blood pressure returned to baseline Anesthetic complications: no   No complications documented.   Last Vitals:  Vitals:   07/25/20 0819 07/25/20 0849  BP:  (!) 137/100  Pulse:    Resp:    Temp: (!) 35.8 C   SpO2:      Last Pain:  Vitals:   07/25/20 0849  TempSrc:   PainSc: 0-No pain                 Lis Savitt

## 2020-07-26 ENCOUNTER — Other Ambulatory Visit: Payer: Self-pay

## 2020-07-26 ENCOUNTER — Ambulatory Visit: Payer: BC Managed Care – PPO | Admitting: Internal Medicine

## 2020-07-26 ENCOUNTER — Encounter: Payer: Self-pay | Admitting: Gastroenterology

## 2020-07-26 ENCOUNTER — Ambulatory Visit: Payer: BC Managed Care – PPO | Admitting: Family

## 2020-07-26 VITALS — BP 130/78 | HR 88 | Temp 97.8°F | Ht 70.0 in | Wt 232.6 lb

## 2020-07-26 DIAGNOSIS — E1310 Other specified diabetes mellitus with ketoacidosis without coma: Secondary | ICD-10-CM | POA: Diagnosis not present

## 2020-07-26 DIAGNOSIS — E1110 Type 2 diabetes mellitus with ketoacidosis without coma: Secondary | ICD-10-CM | POA: Diagnosis not present

## 2020-07-26 DIAGNOSIS — E1159 Type 2 diabetes mellitus with other circulatory complications: Secondary | ICD-10-CM | POA: Diagnosis not present

## 2020-07-26 DIAGNOSIS — Z1231 Encounter for screening mammogram for malignant neoplasm of breast: Secondary | ICD-10-CM | POA: Diagnosis not present

## 2020-07-26 DIAGNOSIS — E119 Type 2 diabetes mellitus without complications: Secondary | ICD-10-CM

## 2020-07-26 DIAGNOSIS — I152 Hypertension secondary to endocrine disorders: Secondary | ICD-10-CM

## 2020-07-26 MED ORDER — FREESTYLE LIBRE 2 READER DEVI: 1.0000 | 0 refills | Status: DC

## 2020-07-26 MED ORDER — BASAGLAR KWIKPEN 100 UNIT/ML ~~LOC~~ SOPN: PEN_INJECTOR | SUBCUTANEOUS | 2 refills | Status: DC

## 2020-07-26 MED ORDER — LOSARTAN POTASSIUM-HCTZ 50-12.5 MG PO TABS: ORAL_TABLET | ORAL | 3 refills | Status: DC

## 2020-07-26 NOTE — Patient Instructions (Addendum)
Results for Julia Brown, Julia Brown (MRN [id number]) as of 07/26/2020 11:39  Ref. Range 06/19/2020 16:43  Hemoglobin A1C Latest Ref Range: 4.8 - 5.6 % 7.1 (H)     Diabetic Ketoacidosis Diabetic ketoacidosis (DKA) is a serious complication of diabetes. This condition develops when there is not enough insulin in the body. Insulin is a hormone that regulates blood sugar (glucose) levels in the body. Normally, insulin allows glucose to enter the cells in the body. The cells break down glucose for energy. Without enough insulin, the body cannot break down glucose and breaks down fats instead. This leads to high blood glucose levels in the body. It also leads to the production of acids that are called ketones. Ketones are poisonous at high levels. If diabetic ketoacidosis is not treated, it can cause severe dehydration and can lead to a coma or death. What are the causes? This condition develops when a lack of insulin causes the body to break down fats instead of glucose. This may be triggered by:  Stress on the body. This stress can be brought on by an illness.  Infection.  Medicines that raise blood glucose levels.  Not taking or skipping doses of diabetes medicines.  New onset of type 1 diabetes mellitus.  Missing insulin on purpose or by accident.  Interruption of insulin through an insulin pump. This can happen if the cannula that connects you to the insulin pump gets dislodged or kinked. What are the signs or symptoms? Symptoms of this condition include:  Excessive thirst or dry mouth.  Excessive urination.  Abdominal pain.  Nausea or vomiting.  Vision changes.  Fruity or sweet-smelling breath.  Weight loss.  Irritability or confusion.  Rapid breathing.  High blood glucose.  High levels of ketones in the body. To know your ketone levels: ? Collect urine in a small cup. ? Dip a test strip in the urine. ? Wait for it to change color. ? Compare the test strip results to the  chart on the container. How is this diagnosed? This condition is diagnosed based on your medical history, a physical exam, and blood tests. You may also have a urine test to check for ketones. How is this treated? This condition may be treated with:  Fluid replacement. This may be done with IV fluids to correct dehydration.  Correcting high blood glucose with insulin. This may be given through the skin as injections or through an IV.  Electrolyte replacement. Electrolytes are minerals in your blood. Electrolytes such as potassium and sodium may be given in pill form or through an IV.  Antibiotic medicines. These may be prescribed if your condition was caused by an infection. Diabetic ketoacidosis is a serious medical condition. You may need emergency treatment in the hospital so that you can be monitored closely. Follow these instructions at home: Medicines  Take over-the-counter and prescription medicines only as told by your health care provider.  Continue to take insulin and other diabetes medicines as told by your health care provider.  If you were prescribed an antibiotic medicine, take it as told by your health care provider. Do not stop taking the antibiotic even if you start to feel better. Eating and drinking  Drink enough fluids to keep your urine pale yellow.  If you are able to eat, follow your usual diet and drink sugar-free liquids such as water, tea, and sugar-free soft drinks. You can also have sugar-free gelatin or ice pops.  If you are not able to eat, drink liquids  that contain sugar in small amounts as you are able. Liquids include fruit juice, regular soft drinks, and sherbet.   Checking ketones and blood glucose  Check your urine for ketones when you are ill and as told by your health care provider. ? If your blood glucose is 240 mg/dL (91.4 mmol/L) or higher, check your urine ketones every 4 hours. If you have moderate or large ketones, call your health care  provider.  Check your blood glucose every day, and as often as told by your health care provider. ? If your blood glucose is high, drink plenty of fluids. This helps to flush out ketones. ? If your blood glucose is above your target for 2 tests in a row, contact your health care provider.   General instructions  Carry a medical alert card or wear medical alert jewelry that shows that you have diabetes.  Exercise only as told by your health care provider. Do not exercise when your blood glucose is high and you have ketones in your urine.  If you get sick, call your health care provider and begin treatment quickly. Your body often needs extra insulin to fight an illness. Check your blood glucose every 4 hours when you are sick.  Keep all follow-up visits as told by your health care provider. This is important. Where to find more information  American Diabetes Association: diabetes.org Contact a health care provider if:  Your blood glucose level is higher than 240 mg/dL (78.2 mmol/L) for 2 days in a row.  You have moderate or large ketones in your urine.  You have a fever.  You cannot eat or drink without vomiting.  You have been vomiting for more than 2 hours.  You continue to have symptoms of diabetic ketoacidosis.  You develop new symptoms. Get help right away if:  Your blood glucose monitor reads high even when you are taking insulin.  You faint.  You have chest pain.  You have trouble breathing.  You have sudden trouble speaking or swallowing.  You have vomiting or diarrhea that gets worse after 3 hours.  You are unable to stay awake.  You have trouble thinking.  You are severely dehydrated. Symptoms of severe dehydration include: ? Extreme thirst. ? Dry mouth. ? Rapid breathing. These symptoms may represent a serious problem that is an emergency. Do not wait to see if the symptoms will go away. Get medical help right away. Call your local emergency services  (911 in the [place] Do not drive yourself to the hospital. Summary  Diabetic ketoacidosis is a serious complication of diabetes. This condition develops when there is not enough insulin in the body.  This condition is diagnosed based on your medical history, a physical exam, and blood tests. You may also have a urine test to check for ketones.  Diabetic ketoacidosis is a serious medical condition. You may need emergency treatment in the hospital to monitor your condition.  Contact your health care provider if your blood glucose is higher than 240 mg/dL for 2 days in a row or if you have moderate or large ketones in your urine. This information is not intended to replace advice given to you by your health care provider. Make sure you discuss any questions you have with your health care provider. Document Revised: 02/01/2019 Document Reviewed: 02/01/2019 [employer] Patient Education  2021 [employer].

## 2020-07-26 NOTE — Progress Notes (Signed)
Chief Complaint  Patient presents with  . Hospitalization Follow-up  . Hyperglycemia   F/u  1. HFU [facility] 06/19/20 to 06/23/20 for DKA and sugars still high 108-209 she states had diabetes since 50 y.o and jardiance 25 mg qd thought likely trigger DKA she is on rybelsus 4 mg qd, metformin xr 1000 mg bid, basaglar 30 units now up to 36 units but sugar elevated  A1C 7.1 06/19/20  She had gestational diabetes  No sx's of DKA currently  2. HTN uncontrolled BP 123-150s/90s-100s on norvasc 5 mg qd coreg 3.125 mg bid, quinapril 20 mg qd but BP not controlled   Review of Systems  Constitutional: Negative for weight loss.  HENT: Negative for hearing loss.   Eyes: Negative for blurred vision.  Respiratory: Negative for shortness of breath.   Cardiovascular: Negative for chest pain.  Gastrointestinal: Negative for abdominal pain.  Musculoskeletal: Negative for falls and joint pain.  Skin: Negative for rash.  Neurological: Negative for headaches.  Psychiatric/Behavioral: Negative for depression.   Past Medical History:  Diagnosis Date  . Chicken pox   . Diabetes mellitus   . GERD (gastroesophageal reflux disease)   . Hyperlipidemia   . Hypertension   . UTI (lower urinary tract infection)    Past Surgical History:  Procedure Laterality Date  . ABLATION  2007   excessive bleeding  . CESAREAN SECTION  1994  . CHOLECYSTECTOMY  1993  . COLONOSCOPY WITH PROPOFOL N/A 07/25/2020   Procedure: COLONOSCOPY WITH PROPOFOL;  Surgeon: Wyline Mood, MD;  Location: [facility] ENDOSCOPY;  Service: Gastroenterology;  Laterality: N/A;  . ESOPHAGOGASTRODUODENOSCOPY N/A 06/22/2020   Procedure: ESOPHAGOGASTRODUODENOSCOPY (EGD);  Surgeon: Toledo, Boykin Nearing, MD;  Location: [facility] ENDOSCOPY;  Service: Gastroenterology;  Laterality: N/A;  . ORIF FEMUR FRACTURE Right    [place]  . TUBAL LIGATION    . uterine ablation     Family History  Problem Relation Age of Onset  . Heart disease Father   . Hyperlipidemia Father   .  Diabetes Father   . Hyperlipidemia Mother   . Hypertension Mother   . Schizophrenia Mother   . Depression Mother   . Diabetes Mother   . Heart disease Other   . Cancer Maternal Aunt        breast  . Diabetes Maternal Grandmother   . Diabetes Maternal Grandfather   . Thyroid cancer Neg Hx    Social History   Socioeconomic History  . Marital status: Married    Spouse name: Not on file  . Number of children: Not on file  . Years of education: Not on file  . Highest education level: Not on file  Occupational History  . Not on file  Tobacco Use  . Smoking status: Former Smoker    Packs/day: 0.50    Years: 10.00    Pack years: 5.00    Types: Cigarettes    Quit date: 06/25/2020    Years since quitting: 0.0  . Smokeless tobacco: Never Used  . Tobacco comment: smoked < 1 pack a day  Vaping Use  . Vaping Use: Never used  Substance and Sexual Activity  . Alcohol use: Yes    Alcohol/week: 0.0 standard drinks    Comment: weekend 3-4drinks  . Drug use: No  . Sexual activity: Yes    Birth control/protection: Surgical    Comment: tubal ligation  Other Topics Concern  . Not on file  Social History Narrative   Lives in [place].  Work - [employer]      Diet - regular   Social Determinants of Health   Financial Resource Strain: Not on file  Food Insecurity: Not on file  Transportation Needs: Not on file  Physical Activity: Not on file  Stress: Not on file  Social Connections: Not on file  Intimate Partner Violence: Not on file   Current Meds  Medication Sig  . amLODipine (NORVASC) 5 MG tablet Take 1 tablet (5 mg total) by mouth daily.  Marland Kitchen amLODipine (NORVASC) 5 MG tablet Take 1 tablet (5 mg total) by mouth daily.  . carvedilol (COREG) 3.125 MG tablet TAKE 1 TABLET BY MOUTH TWICE DAILY WITH A MEAL (Patient taking differently: Take 3.125 mg by mouth 2 (two) times daily with a meal. TAKE 1 TABLET BY MOUTH TWICE DAILY WITH A MEAL)  . Continuous Blood Gluc  Sensor (FREESTYLE LIBRE 2 SENSOR) MISC Place once sensor every 14 days.  Marland Kitchen losartan-hydrochlorothiazide (HYZAAR) 50-12.5 MG tablet Stop quinapril take 1/2 pill x 3 days and if BP is not <130/<80 take 1 whole pill  . metFORMIN (GLUCOPHAGE-XR) 500 MG 24 hr tablet Take 2 tablets (1,000 mg total) by mouth 2 (two) times daily with a meal.  . metoCLOPramide (REGLAN) 10 MG tablet Take 1 tablet (10 mg total) by mouth every 8 (eight) hours as needed for up to 4 days for nausea or vomiting.  . pantoprazole (PROTONIX) 40 MG tablet Take 1 tablet (40 mg total) by mouth daily.  . rosuvastatin (CRESTOR) 20 MG tablet Take 1 tablet (20 mg total) by mouth every other day.  . [DISCONTINUED] Continuous Blood Gluc Receiver (FREESTYLE LIBRE 2 READER) DEVI 1 Device by Does not apply route every 14 (fourteen) days.  . [DISCONTINUED] Insulin Glargine (BASAGLAR KWIKPEN) 100 UNIT/ML Inject 30 Units into the skin in the morning.  . [DISCONTINUED] quinapril (ACCUPRIL) 20 MG tablet Take 1 tablet (20 mg total) by mouth at bedtime. (Patient taking differently: Take 40 mg by mouth at bedtime.)  . [DISCONTINUED] Semaglutide (RYBELSUS) 3 MG TABS Take 3 mg by mouth daily.   Allergies  Allergen Reactions  . Contrast Media [Iodinated Diagnostic Agents] Anaphylaxis    Wheezing and rash immediately after CT w/.  . Statins Palpitations  . Jardiance [Empagliflozin]     DKA hospitalized 05/2020  . Bactrim [Sulfamethoxazole-Trimethoprim] Itching    itching  . Penicillins Rash  . Trulicity [Dulaglutide] Nausea Only   Recent Results (from the past 2160 hour(s))  CBC with Differential/Platelet     Status: Abnormal   Collection Time: 06/19/20  5:16 AM  Result Value Ref Range   WBC 16.4 (H) 4.0 - 10.5 K/uL   RBC 5.28 (H) 3.87 - 5.11 MIL/uL   Hemoglobin 16.0 (H) 12.0 - 15.0 g/dL   HCT 96.0 (H) 45.4 - 09.8 %   MCV 87.9 80.0 - 100.0 fL   MCH 30.3 26.0 - 34.0 pg   MCHC 34.5 30.0 - 36.0 g/dL   RDW 11.9 14.7 - 82.9 %   Platelets 294 150  - 400 K/uL   nRBC 0.0 0.0 - 0.2 %   Neutrophils Relative % 86 %   Neutro Abs 14.2 (H) 1.7 - 7.7 K/uL   Lymphocytes Relative 8 %   Lymphs Abs 1.3 0.7 - 4.0 K/uL   Monocytes Relative 4 %   Monocytes Absolute 0.7 0.1 - 1.0 K/uL   Eosinophils Relative 0 %   Eosinophils Absolute 0.0 0.0 - 0.5 K/uL   Basophils Relative 1 %  Basophils Absolute 0.1 0.0 - 0.1 K/uL   Immature Granulocytes 1 %   Abs Immature Granulocytes 0.13 (H) 0.00 - 0.07 K/uL    Comment: Performed at [facility], [address] [place], [place] [address]  Comprehensive metabolic panel     Status: Abnormal   Collection Time: 06/19/20  5:16 AM  Result Value Ref Range   Sodium 135 135 - 145 mmol/L   Potassium 4.5 3.5 - 5.1 mmol/L   Chloride 101 98 - 111 mmol/L   CO2 19 (L) 22 - 32 mmol/L   Glucose, Bld 179 (H) 70 - 99 mg/dL    Comment: Glucose reference range applies only to samples taken after fasting for at least 8 hours.   BUN 15 6 - 20 mg/dL   Creatinine, Ser 9.52 0.44 - 1.00 mg/dL   Calcium 9.8 8.9 - 84.1 mg/dL   Total Protein 8.6 (H) 6.5 - 8.1 g/dL   Albumin 5.0 3.5 - 5.0 g/dL   AST 28 15 - 41 U/L   ALT 29 0 - 44 U/L   Alkaline Phosphatase 63 38 - 126 U/L   Total Bilirubin 1.2 0.3 - 1.2 mg/dL   GFR, Estimated >32 >44 mL/min    Comment: (NOTE) Calculated using the CKD-EPI Creatinine Equation (2021)    Anion gap 15 5 - 15    Comment: Performed at [facility], [address] [place], [place] [address]  Lipase, blood     Status: None   Collection Time: 06/19/20  5:16 AM  Result Value Ref Range   Lipase 27 11 - 51 U/L    Comment: Performed at [facility], [address] [place], [place] [address]  Urinalysis, Routine w reflex microscopic Urine, Clean Catch     Status: Abnormal   Collection Time: 06/19/20  9:24 AM  Result Value Ref Range   Color, Urine YELLOW (A) YELLOW   APPearance CLEAR (A) CLEAR   Specific Gravity, Urine 1.028 1.005 - 1.030   pH 5.0 5.0 - 8.0   Glucose,  UA >=500 (A) NEGATIVE mg/dL   Hgb urine dipstick NEGATIVE NEGATIVE   Bilirubin Urine NEGATIVE NEGATIVE   Ketones, ur 80 (A) NEGATIVE mg/dL   Protein, ur NEGATIVE NEGATIVE mg/dL   Nitrite NEGATIVE NEGATIVE   Leukocytes,Ua NEGATIVE NEGATIVE   RBC / HPF 0-5 0 - 5 RBC/hpf   WBC, UA 0-5 0 - 5 WBC/hpf   Bacteria, UA NONE SEEN NONE SEEN   Squamous Epithelial / LPF 0-5 0 - 5    Comment: Performed at [facility], [address] [place], [place] [address]  Urine Drug Screen, Qualitative ([facility] only)     Status: Abnormal   Collection Time: 06/19/20  9:24 AM  Result Value Ref Range   Tricyclic, Ur Screen NONE DETECTED NONE DETECTED   Amphetamines, Ur Screen NONE DETECTED NONE DETECTED   MDMA (Ecstasy)Ur Screen NONE DETECTED NONE DETECTED   Cocaine Metabolite,Ur [place] NONE DETECTED NONE DETECTED   Opiate, Ur Screen POSITIVE (A) NONE DETECTED   Phencyclidine (PCP) Ur S NONE DETECTED NONE DETECTED   Cannabinoid 50 Ng, Ur [place] NONE DETECTED NONE DETECTED   Barbiturates, Ur Screen NONE DETECTED NONE DETECTED   Benzodiazepine, Ur Scrn NONE DETECTED NONE DETECTED   Methadone Scn, Ur NONE DETECTED NONE DETECTED    Comment: (NOTE) Tricyclics + metabolites, urine    Cutoff 1000 ng/mL Amphetamines + metabolites, urine  Cutoff 1000 ng/mL MDMA (Ecstasy), urine  Cutoff 500 ng/mL Cocaine Metabolite, urine          Cutoff 300 ng/mL Opiate + metabolites, urine        Cutoff 300 ng/mL Phencyclidine (PCP), urine         Cutoff 25 ng/mL Cannabinoid, urine                 Cutoff 50 ng/mL Barbiturates + metabolites, urine  Cutoff 200 ng/mL Benzodiazepine, urine              Cutoff 200 ng/mL Methadone, urine                   Cutoff 300 ng/mL  The urine drug screen provides only a preliminary, unconfirmed analytical test result and should not be used for non-medical purposes. Clinical consideration and professional judgment should be applied to any positive drug screen result due to  possible interfering substances. A more specific alternate chemical method must be used in order to obtain a confirmed analytical result. Gas chromatography / mass spectrometry (GC/MS) is the preferred confirm atory method. Performed at [facility], [address] [place], [place] [address]   Resp Panel by RT-PCR (Flu A&B, Covid) Nasopharyngeal Swab     Status: None   Collection Time: 06/19/20 10:48 AM   Specimen: Nasopharyngeal Swab; Nasopharyngeal(NP) swabs in vial transport medium  Result Value Ref Range   SARS Coronavirus 2 by RT PCR NEGATIVE NEGATIVE    Comment: (NOTE) SARS-CoV-2 target nucleic acids are NOT DETECTED.  The SARS-CoV-2 RNA is generally detectable in upper respiratory specimens during the acute phase of infection. The lowest concentration of SARS-CoV-2 viral copies this assay can detect is 138 copies/mL. A negative result does not preclude SARS-Cov-2 infection and should not be used as the sole basis for treatment or other patient management decisions. A negative result may occur with  improper specimen collection/handling, submission of specimen other than nasopharyngeal swab, presence of viral mutation(s) within the areas targeted by this assay, and inadequate number of viral copies(<138 copies/mL). A negative result must be combined with clinical observations, patient history, and epidemiological information. The expected result is Negative.  Fact Sheet for Patients:  BloggerCourse.com  Fact Sheet for Healthcare Providers:  SeriousBroker.it  This test is no t yet approved or cleared by the [place] FDA and  has been authorized for detection and/or diagnosis of SARS-CoV-2 by FDA under an Emergency Use Authorization (EUA). This EUA will remain  in effect (meaning this test can be used) for the duration of the COVID-19 declaration under Section 564(b)(1) of the Act, 21 U.S.C.section  360bbb-3(b)(1), unless the authorization is terminated  or revoked sooner.       Influenza A by PCR NEGATIVE NEGATIVE   Influenza B by PCR NEGATIVE NEGATIVE    Comment: (NOTE) The Xpert Xpress SARS-CoV-2/FLU/RSV plus assay is intended as an aid in the diagnosis of influenza from Nasopharyngeal swab specimens and should not be used as a sole basis for treatment. Nasal washings and aspirates are unacceptable for Xpert Xpress SARS-CoV-2/FLU/RSV testing.  Fact Sheet for Patients: BloggerCourse.com  Fact Sheet for Healthcare Providers: SeriousBroker.it  This test is not yet approved or cleared by the [place] FDA and has been authorized for detection and/or diagnosis of SARS-CoV-2 by FDA under an Emergency Use Authorization (EUA). This EUA will remain in effect (meaning this test can be used) for the duration of the COVID-19 declaration under Section 564(b)(1) of the Act, 21 U.S.C. section 360bbb-3(b)(1), unless the  authorization is terminated or revoked.  Performed at [facility], [address] [place], [place] [address]   HIV Antibody (routine testing w rflx)     Status: None   Collection Time: 06/19/20 12:16 PM  Result Value Ref Range   HIV Screen 4th Generation wRfx Non Reactive Non Reactive    Comment: Performed at [facility], [address] [place], [place] [address]  Hemoglobin A1c     Status: Abnormal   Collection Time: 06/19/20 12:16 PM  Result Value Ref Range   Hgb A1c MFr Bld 7.2 (H) 4.8 - 5.6 %    Comment: (NOTE) Pre diabetes:          5.7%-6.4%  Diabetes:              >6.4%  Glycemic control for   <7.0% adults with diabetes    Mean Plasma Glucose 159.94 mg/dL    Comment: Performed at [facility], [address] [place], [place] [address]  Basic metabolic panel     Status: Abnormal   Collection Time: 06/19/20 12:16 PM  Result Value Ref Range   Sodium 136 135 - 145 mmol/L    Potassium 4.3 3.5 - 5.1 mmol/L   Chloride 107 98 - 111 mmol/L   CO2 15 (L) 22 - 32 mmol/L   Glucose, Bld 142 (H) 70 - 99 mg/dL    Comment: Glucose reference range applies only to samples taken after fasting for at least 8 hours.   BUN 14 6 - 20 mg/dL   Creatinine, Ser 9.140.71 0.44 - 1.00 mg/dL   Calcium 8.8 (L) 8.9 - 10.3 mg/dL   GFR, Estimated >78>60 >29>60 mL/min    Comment: (NOTE) Calculated using the CKD-EPI Creatinine Equation (2021)    Anion gap 14 5 - 15    Comment: Performed at [facility], [address] [place], [place] [address]  Glucose, capillary     Status: Abnormal   Collection Time: 06/19/20  4:19 PM  Result Value Ref Range   Glucose-Capillary 136 (H) 70 - 99 mg/dL    Comment: Glucose reference range applies only to samples taken after fasting for at least 8 hours.  Basic metabolic panel     Status: Abnormal   Collection Time: 06/19/20  4:43 PM  Result Value Ref Range   Sodium 136 135 - 145 mmol/L   Potassium 4.6 3.5 - 5.1 mmol/L   Chloride 107 98 - 111 mmol/L   CO2 14 (L) 22 - 32 mmol/L   Glucose, Bld 132 (H) 70 - 99 mg/dL    Comment: Glucose reference range applies only to samples taken after fasting for at least 8 hours.   BUN 14 6 - 20 mg/dL   Creatinine, Ser 0.860.80 0.44 - 1.00 mg/dL   Calcium 9.2 8.9 - 57.810.3 mg/dL   GFR, Estimated >46>60 >96>60 mL/min    Comment: (NOTE) Calculated using the CKD-EPI Creatinine Equation (2021)    Anion gap 15 5 - 15    Comment: Performed at [facility], [address] [place], [place] [address]  Beta-hydroxybutyric acid     Status: Abnormal   Collection Time: 06/19/20  4:43 PM  Result Value Ref Range   Beta-Hydroxybutyric Acid 4.78 (H) 0.05 - 0.27 mmol/L    Comment: RESULT CONFIRMED BY MANUAL DILUTION MJU/SKL Performed at [facility], [address] [place], [place] [address]   Hemoglobin A1c     Status: Abnormal   Collection Time:  06/19/20  4:43 PM  Result Value Ref Range   Hgb A1c MFr Bld 7.1 (H)  4.8 - 5.6 %    Comment: (NOTE) Pre diabetes:          5.7%-6.4%  Diabetes:              >6.4%  Glycemic control for   <7.0% adults with diabetes    Mean Plasma Glucose 157.07 mg/dL    Comment: Performed at [facility], [address], [place], [place] [address]  Magnesium     Status: None   Collection Time: 06/19/20  4:43 PM  Result Value Ref Range   Magnesium 2.4 1.7 - 2.4 mg/dL    Comment: Performed at [facility], [address] [place], [place] [address]  Phosphorus     Status: None   Collection Time: 06/19/20  4:43 PM  Result Value Ref Range   Phosphorus 4.5 2.5 - 4.6 mg/dL    Comment: Performed at [facility], [address] [place], [place] [address]  MRSA PCR Screening     Status: None   Collection Time: 06/19/20  4:47 PM   Specimen: Nasal Mucosa; Nasopharyngeal  Result Value Ref Range   MRSA by PCR NEGATIVE NEGATIVE    Comment:        The GeneXpert MRSA Assay (FDA approved for NASAL specimens only), is one component of a comprehensive MRSA colonization surveillance program. It is not intended to diagnose MRSA infection nor to guide or monitor treatment for MRSA infections. Performed at [facility], [address] [place], [place] [address]   Glucose, capillary     Status: Abnormal   Collection Time: 06/19/20  5:02 PM  Result Value Ref Range   Glucose-Capillary 123 (H) 70 - 99 mg/dL    Comment: Glucose reference range applies only to samples taken after fasting for at least 8 hours.  Glucose, capillary     Status: Abnormal   Collection Time: 06/19/20  5:44 PM  Result Value Ref Range   Glucose-Capillary 124 (H) 70 - 99 mg/dL    Comment: Glucose reference range applies only to samples taken after fasting for at least 8 hours.  Glucose, capillary     Status: Abnormal   Collection Time: 06/19/20  6:34 PM  Result Value Ref Range   Glucose-Capillary 135 (H) 70 - 99 mg/dL    Comment: Glucose reference range applies only  to samples taken after fasting for at least 8 hours.  Glucose, capillary     Status: Abnormal   Collection Time: 06/19/20  6:57 PM  Result Value Ref Range   Glucose-Capillary 145 (H) 70 - 99 mg/dL    Comment: Glucose reference range applies only to samples taken after fasting for at least 8 hours.  Basic metabolic panel     Status: Abnormal   Collection Time: 06/19/20  7:09 PM  Result Value Ref Range   Sodium 137 135 - 145 mmol/L   Potassium 4.8 3.5 - 5.1 mmol/L   Chloride 107 98 - 111 mmol/L   CO2 16 (L) 22 - 32 mmol/L   Glucose, Bld 137 (H) 70 - 99 mg/dL    Comment: Glucose reference range applies only to samples taken after fasting for at least 8 hours.   BUN 14 6 - 20 mg/dL   Creatinine, Ser 2.95 0.44 - 1.00 mg/dL   Calcium 9.1 8.9 - 62.1 mg/dL   GFR, Estimated >30 >86 mL/min    Comment: (NOTE)  Calculated using the CKD-EPI Creatinine Equation (2021)    Anion gap 14 5 - 15    Comment: Performed at [facility], [address] [place], [place] [address]  Glucose, capillary     Status: Abnormal   Collection Time: 06/19/20  7:59 PM  Result Value Ref Range   Glucose-Capillary 105 (H) 70 - 99 mg/dL    Comment: Glucose reference range applies only to samples taken after fasting for at least 8 hours.  Glucose, capillary     Status: Abnormal   Collection Time: 06/19/20  9:03 PM  Result Value Ref Range   Glucose-Capillary 152 (H) 70 - 99 mg/dL    Comment: Glucose reference range applies only to samples taken after fasting for at least 8 hours.  Basic metabolic panel     Status: Abnormal   Collection Time: 06/19/20  9:50 PM  Result Value Ref Range   Sodium 135 135 - 145 mmol/L   Potassium 4.6 3.5 - 5.1 mmol/L   Chloride 106 98 - 111 mmol/L   CO2 17 (L) 22 - 32 mmol/L   Glucose, Bld 147 (H) 70 - 99 mg/dL    Comment: Glucose reference range applies only to samples taken after fasting for at least 8 hours.   BUN 15 6 - 20 mg/dL   Creatinine, Ser 6.04 0.44 - 1.00 mg/dL    Calcium 9.0 8.9 - 54.0 mg/dL   GFR, Estimated >98 >11 mL/min    Comment: (NOTE) Calculated using the CKD-EPI Creatinine Equation (2021)    Anion gap 12 5 - 15    Comment: Performed at [facility], [address] [place], [place] [address]  Beta-hydroxybutyric acid     Status: Abnormal   Collection Time: 06/19/20  9:50 PM  Result Value Ref Range   Beta-Hydroxybutyric Acid 3.56 (H) 0.05 - 0.27 mmol/L    Comment: Performed at [facility], [address] [place], [place] [address]  Glucose, capillary     Status: Abnormal   Collection Time: 06/19/20 10:08 PM  Result Value Ref Range   Glucose-Capillary 138 (H) 70 - 99 mg/dL    Comment: Glucose reference range applies only to samples taken after fasting for at least 8 hours.  Glucose, capillary     Status: Abnormal   Collection Time: 06/19/20 11:20 PM  Result Value Ref Range   Glucose-Capillary 154 (H) 70 - 99 mg/dL    Comment: Glucose reference range applies only to samples taken after fasting for at least 8 hours.  HM DIABETES EYE EXAM     Status: None   Collection Time: 06/20/20 12:00 AM  Result Value Ref Range   HM Diabetic Eye Exam No Retinopathy No Retinopathy    Comment: Dr. [facility]   Glucose, capillary     Status: Abnormal   Collection Time: 06/20/20 12:17 AM  Result Value Ref Range   Glucose-Capillary 137 (H) 70 - 99 mg/dL    Comment: Glucose reference range applies only to samples taken after fasting for at least 8 hours.  Glucose, capillary     Status: Abnormal   Collection Time: 06/20/20  1:22 AM  Result Value Ref Range   Glucose-Capillary 147 (H) 70 - 99 mg/dL    Comment: Glucose reference range applies only to samples taken after fasting for at least 8 hours.  Basic metabolic panel     Status: Abnormal   Collection Time: 06/20/20  1:36 AM  Result Value Ref Range   Sodium  136 135 - 145 mmol/L   Potassium 4.5 3.5 - 5.1 mmol/L   Chloride 107 98 - 111 mmol/L   CO2 19 (L) 22 - 32  mmol/L   Glucose, Bld 153 (H) 70 - 99 mg/dL    Comment: Glucose reference range applies only to samples taken after fasting for at least 8 hours.   BUN 14 6 - 20 mg/dL   Creatinine, Ser 1.61 0.44 - 1.00 mg/dL   Calcium 9.1 8.9 - 09.6 mg/dL   GFR, Estimated >04 >54 mL/min    Comment: (NOTE) Calculated using the CKD-EPI Creatinine Equation (2021)    Anion gap 10 5 - 15    Comment: Performed at [facility], [address] [place], [place] [address]  Glucose, capillary     Status: Abnormal   Collection Time: 06/20/20  2:16 AM  Result Value Ref Range   Glucose-Capillary 138 (H) 70 - 99 mg/dL    Comment: Glucose reference range applies only to samples taken after fasting for at least 8 hours.  Glucose, capillary     Status: Abnormal   Collection Time: 06/20/20  3:35 AM  Result Value Ref Range   Glucose-Capillary 150 (H) 70 - 99 mg/dL    Comment: Glucose reference range applies only to samples taken after fasting for at least 8 hours.  Glucose, capillary     Status: Abnormal   Collection Time: 06/20/20  4:29 AM  Result Value Ref Range   Glucose-Capillary 134 (H) 70 - 99 mg/dL    Comment: Glucose reference range applies only to samples taken after fasting for at least 8 hours.  Glucose, capillary     Status: Abnormal   Collection Time: 06/20/20  5:36 AM  Result Value Ref Range   Glucose-Capillary 159 (H) 70 - 99 mg/dL    Comment: Glucose reference range applies only to samples taken after fasting for at least 8 hours.  CBC     Status: Abnormal   Collection Time: 06/20/20  5:56 AM  Result Value Ref Range   WBC 15.8 (H) 4.0 - 10.5 K/uL   RBC 4.83 3.87 - 5.11 MIL/uL   Hemoglobin 14.5 12.0 - 15.0 g/dL   HCT 91.4 78.2 - 95.6 %   MCV 90.3 80.0 - 100.0 fL   MCH 30.0 26.0 - 34.0 pg   MCHC 33.3 30.0 - 36.0 g/dL   RDW 21.3 08.6 - 57.8 %   Platelets 252 150 - 400 K/uL   nRBC 0.0 0.0 - 0.2 %    Comment: Performed at [facility], [address], [place], [place]  [address]  Basic metabolic panel     Status: Abnormal   Collection Time: 06/20/20  5:56 AM  Result Value Ref Range   Sodium 136 135 - 145 mmol/L   Potassium 4.2 3.5 - 5.1 mmol/L   Chloride 106 98 - 111 mmol/L   CO2 19 (L) 22 - 32 mmol/L   Glucose, Bld 160 (H) 70 - 99 mg/dL    Comment: Glucose reference range applies only to samples taken after fasting for at least 8 hours.   BUN 13 6 - 20 mg/dL   Creatinine, Ser 9.52 0.44 - 1.00 mg/dL   Calcium 9.2 8.9 - 84.1 mg/dL   GFR, Estimated >32 >44 mL/min    Comment: (NOTE) Calculated using the CKD-EPI Creatinine Equation (2021)    Anion gap 11 5 - 15    Comment: Performed at [facility], [address] [place],  [place] [address]  Beta-hydroxybutyric acid     Status: Abnormal   Collection Time: 06/20/20  5:56 AM  Result Value Ref Range   Beta-Hydroxybutyric Acid 2.06 (H) 0.05 - 0.27 mmol/L    Comment: Performed at [facility], [address] [place], [place] [address]  Glucose, capillary     Status: Abnormal   Collection Time: 06/20/20  6:25 AM  Result Value Ref Range   Glucose-Capillary 163 (H) 70 - 99 mg/dL    Comment: Glucose reference range applies only to samples taken after fasting for at least 8 hours.  Glucose, capillary     Status: Abnormal   Collection Time: 06/20/20  7:30 AM  Result Value Ref Range   Glucose-Capillary 141 (H) 70 - 99 mg/dL    Comment: Glucose reference range applies only to samples taken after fasting for at least 8 hours.  Glucose, capillary     Status: Abnormal   Collection Time: 06/20/20  8:25 AM  Result Value Ref Range   Glucose-Capillary 161 (H) 70 - 99 mg/dL    Comment: Glucose reference range applies only to samples taken after fasting for at least 8 hours.  Glucose, capillary     Status: Abnormal   Collection Time: 06/20/20 10:10 AM  Result Value Ref Range   Glucose-Capillary 149 (H) 70 - 99 mg/dL    Comment: Glucose reference range applies only to samples taken after fasting  for at least 8 hours.  Glucose, capillary     Status: Abnormal   Collection Time: 06/20/20 11:36 AM  Result Value Ref Range   Glucose-Capillary 180 (H) 70 - 99 mg/dL    Comment: Glucose reference range applies only to samples taken after fasting for at least 8 hours.  Glucose, capillary     Status: Abnormal   Collection Time: 06/20/20 12:08 PM  Result Value Ref Range   Glucose-Capillary 170 (H) 70 - 99 mg/dL    Comment: Glucose reference range applies only to samples taken after fasting for at least 8 hours.  Glucose, capillary     Status: Abnormal   Collection Time: 06/20/20  1:41 PM  Result Value Ref Range   Glucose-Capillary 167 (H) 70 - 99 mg/dL    Comment: Glucose reference range applies only to samples taken after fasting for at least 8 hours.  Glucose, capillary     Status: Abnormal   Collection Time: 06/20/20  3:35 PM  Result Value Ref Range   Glucose-Capillary 162 (H) 70 - 99 mg/dL    Comment: Glucose reference range applies only to samples taken after fasting for at least 8 hours.  Beta-hydroxybutyric acid     Status: Abnormal   Collection Time: 06/20/20  3:59 PM  Result Value Ref Range   Beta-Hydroxybutyric Acid 1.37 (H) 0.05 - 0.27 mmol/L    Comment: Performed at [facility], [address] [place], [place] [address]  Basic metabolic panel     Status: Abnormal   Collection Time: 06/20/20  3:59 PM  Result Value Ref Range   Sodium 134 (L) 135 - 145 mmol/L   Potassium 4.1 3.5 - 5.1 mmol/L   Chloride 101 98 - 111 mmol/L   CO2 21 (L) 22 - 32 mmol/L   Glucose, Bld 171 (H) 70 - 99 mg/dL    Comment: Glucose reference range applies only to samples taken after fasting for at least 8 hours.   BUN 11 6 - 20 mg/dL   Creatinine, Ser 7.10 0.44 - 1.00 mg/dL  Calcium 9.2 8.9 - 10.3 mg/dL   GFR, Estimated >40 >98 mL/min    Comment: (NOTE) Calculated using the CKD-EPI Creatinine Equation (2021)    Anion gap 12 5 - 15    Comment: Performed at [facility],  [address] [place], [place] [address]  Glucose, capillary     Status: Abnormal   Collection Time: 06/20/20  5:25 PM  Result Value Ref Range   Glucose-Capillary 200 (H) 70 - 99 mg/dL    Comment: Glucose reference range applies only to samples taken after fasting for at least 8 hours.  Glucose, capillary     Status: Abnormal   Collection Time: 06/20/20  6:42 PM  Result Value Ref Range   Glucose-Capillary 170 (H) 70 - 99 mg/dL    Comment: Glucose reference range applies only to samples taken after fasting for at least 8 hours.  Glucose, capillary     Status: Abnormal   Collection Time: 06/20/20  7:46 PM  Result Value Ref Range   Glucose-Capillary 184 (H) 70 - 99 mg/dL    Comment: Glucose reference range applies only to samples taken after fasting for at least 8 hours.  Glucose, capillary     Status: Abnormal   Collection Time: 06/20/20  8:37 PM  Result Value Ref Range   Glucose-Capillary 210 (H) 70 - 99 mg/dL    Comment: Glucose reference range applies only to samples taken after fasting for at least 8 hours.  Glucose, capillary     Status: Abnormal   Collection Time: 06/20/20  9:37 PM  Result Value Ref Range   Glucose-Capillary 164 (H) 70 - 99 mg/dL    Comment: Glucose reference range applies only to samples taken after fasting for at least 8 hours.  Glucose, capillary     Status: Abnormal   Collection Time: 06/20/20 10:36 PM  Result Value Ref Range   Glucose-Capillary 180 (H) 70 - 99 mg/dL    Comment: Glucose reference range applies only to samples taken after fasting for at least 8 hours.  Glucose, capillary     Status: Abnormal   Collection Time: 06/20/20 11:34 PM  Result Value Ref Range   Glucose-Capillary 204 (H) 70 - 99 mg/dL    Comment: Glucose reference range applies only to samples taken after fasting for at least 8 hours.  Beta-hydroxybutyric acid     Status: Abnormal   Collection Time: 06/21/20 12:16 AM  Result Value Ref Range   Beta-Hydroxybutyric Acid 0.32 (H)  0.05 - 0.27 mmol/L    Comment: Performed at [facility], [address] [place], [place] [address]  Basic metabolic panel     Status: Abnormal   Collection Time: 06/21/20 12:16 AM  Result Value Ref Range   Sodium 132 (L) 135 - 145 mmol/L   Potassium 3.5 3.5 - 5.1 mmol/L   Chloride 100 98 - 111 mmol/L   CO2 22 22 - 32 mmol/L   Glucose, Bld 176 (H) 70 - 99 mg/dL    Comment: Glucose reference range applies only to samples taken after fasting for at least 8 hours.   BUN 11 6 - 20 mg/dL   Creatinine, Ser 6.21 0.44 - 1.00 mg/dL   Calcium 9.1 8.9 - 30.8 mg/dL   GFR, Estimated >65 >78 mL/min    Comment: (NOTE) Calculated using the CKD-EPI Creatinine Equation (2021)    Anion gap 10 5 - 15    Comment: Performed at [facility], [address] [place], [place] [address]  Glucose, capillary     Status: Abnormal   Collection Time: 06/21/20 12:39 AM  Result Value Ref Range   Glucose-Capillary 166 (H) 70 - 99 mg/dL    Comment: Glucose reference range applies only to samples taken after fasting for at least 8 hours.  Glucose, capillary     Status: Abnormal   Collection Time: 06/21/20  1:43 AM  Result Value Ref Range   Glucose-Capillary 179 (H) 70 - 99 mg/dL    Comment: Glucose reference range applies only to samples taken after fasting for at least 8 hours.  Glucose, capillary     Status: Abnormal   Collection Time: 06/21/20  2:41 AM  Result Value Ref Range   Glucose-Capillary 175 (H) 70 - 99 mg/dL    Comment: Glucose reference range applies only to samples taken after fasting for at least 8 hours.  Glucose, capillary     Status: Abnormal   Collection Time: 06/21/20  4:40 AM  Result Value Ref Range   Glucose-Capillary 159 (H) 70 - 99 mg/dL    Comment: Glucose reference range applies only to samples taken after fasting for at least 8 hours.  Glucose, capillary     Status: Abnormal   Collection Time: 06/21/20  5:40 AM  Result Value Ref Range   Glucose-Capillary 175 (H)  70 - 99 mg/dL    Comment: Glucose reference range applies only to samples taken after fasting for at least 8 hours.  Glucose, capillary     Status: Abnormal   Collection Time: 06/21/20  6:29 AM  Result Value Ref Range   Glucose-Capillary 166 (H) 70 - 99 mg/dL    Comment: Glucose reference range applies only to samples taken after fasting for at least 8 hours.  Glucose, capillary     Status: Abnormal   Collection Time: 06/21/20  7:27 AM  Result Value Ref Range   Glucose-Capillary 200 (H) 70 - 99 mg/dL    Comment: Glucose reference range applies only to samples taken after fasting for at least 8 hours.  Beta-hydroxybutyric acid     Status: Abnormal   Collection Time: 06/21/20  7:30 AM  Result Value Ref Range   Beta-Hydroxybutyric Acid 0.35 (H) 0.05 - 0.27 mmol/L    Comment: Performed at [facility], [address] [place], [place] [address]  Basic metabolic panel     Status: Abnormal   Collection Time: 06/21/20  7:30 AM  Result Value Ref Range   Sodium 131 (L) 135 - 145 mmol/L   Potassium 3.6 3.5 - 5.1 mmol/L   Chloride 98 98 - 111 mmol/L   CO2 23 22 - 32 mmol/L   Glucose, Bld 191 (H) 70 - 99 mg/dL    Comment: Glucose reference range applies only to samples taken after fasting for at least 8 hours.   BUN 10 6 - 20 mg/dL   Creatinine, Ser 6.04 0.44 - 1.00 mg/dL   Calcium 9.0 8.9 - 54.0 mg/dL   GFR, Estimated >98 >11 mL/min    Comment: (NOTE) Calculated using the CKD-EPI Creatinine Equation (2021)    Anion gap 10 5 - 15    Comment: Performed at [facility], [address] [place], [place] [address]  Glucose, capillary     Status: Abnormal   Collection Time: 06/21/20  9:05 AM  Result Value Ref Range   Glucose-Capillary 236 (H) 70 - 99 mg/dL    Comment: Glucose reference range applies only to samples taken after fasting for at least 8  hours.  Glucose, capillary     Status: Abnormal   Collection Time: 06/21/20 12:00 PM  Result Value Ref Range    Glucose-Capillary 204 (H) 70 - 99 mg/dL    Comment: Glucose reference range applies only to samples taken after fasting for at least 8 hours.  Glucose, capillary     Status: Abnormal   Collection Time: 06/21/20  3:59 PM  Result Value Ref Range   Glucose-Capillary 144 (H) 70 - 99 mg/dL    Comment: Glucose reference range applies only to samples taken after fasting for at least 8 hours.  Glucose, capillary     Status: Abnormal   Collection Time: 06/21/20  9:28 PM  Result Value Ref Range   Glucose-Capillary 144 (H) 70 - 99 mg/dL    Comment: Glucose reference range applies only to samples taken after fasting for at least 8 hours.  Glucose, capillary     Status: Abnormal   Collection Time: 06/22/20  7:46 AM  Result Value Ref Range   Glucose-Capillary 158 (H) 70 - 99 mg/dL    Comment: Glucose reference range applies only to samples taken after fasting for at least 8 hours.  Glucose, capillary     Status: Abnormal   Collection Time: 06/22/20 10:30 AM  Result Value Ref Range   Glucose-Capillary 154 (H) 70 - 99 mg/dL    Comment: Glucose reference range applies only to samples taken after fasting for at least 8 hours.  Surgical pathology     Status: None   Collection Time: 06/22/20 10:50 AM  Result Value Ref Range   SURGICAL PATHOLOGY      SURGICAL PATHOLOGY CASE: [id number] PATIENT: [facility] Surgical Pathology Report     Specimen Submitted: A. Stomach, body; cbx  Clinical History: Intractable nausea, intermittent vomiting.  Diffuse gastritis    DIAGNOSIS: A. STOMACH, BODY; COLD BIOPSY: - GASTRIC OXYNTIC MUCOSA WITH NO SIGNIFICANT HISTOPATHOLOGIC CHANGE. - FOCAL FEATURES SUGGESTIVE OF PPI EFFECT. - NEGATIVE FOR H. PYLORI, DYSPLASIA, AND MALIGNANCY.  GROSS DESCRIPTION: A. Labeled: cbx gastric body rule out H. pylori Received: Formalin Collection time: 10:50 AM on 06/22/2020 Placed into formalin time: 10:50 AM on 06/22/2020 Tissue fragment(s): 3 Size: Aggregate, 0.8 x  0.5 x 0.2 cm Description: Tan soft tissue fragments Entirely submitted in 1 cassette.  RB 06/24/2020  Final Diagnosis performed by Katherine Mantle, MD.   Electronically signed 06/25/2020 9:43:56AM The electronic signature indicates that the named Attending Pathologist has evaluated the specimen Technical component performed  at [facility], [address] [place], [place] [address] Lab: [phone] Dir: Jolene Schimke, MD, MMM  Professional component performed at [facility], [facility], [address] [place], [place], [place] [address] Lab: [phone] Dir: Georgiann Cocker. Rubinas, MD   Glucose, capillary     Status: Abnormal   Collection Time: 06/22/20 11:16 AM  Result Value Ref Range   Glucose-Capillary 136 (H) 70 - 99 mg/dL    Comment: Glucose reference range applies only to samples taken after fasting for at least 8 hours.  Glucose, capillary     Status: Abnormal   Collection Time: 06/22/20 12:08 PM  Result Value Ref Range   Glucose-Capillary 140 (H) 70 - 99 mg/dL    Comment: Glucose reference range applies only to samples taken after fasting for at least 8 hours.  Glucose, capillary     Status: Abnormal   Collection Time: 06/22/20  4:36 PM  Result Value Ref Range   Glucose-Capillary 196 (H) 70 - 99 mg/dL    Comment:  Glucose reference range applies only to samples taken after fasting for at least 8 hours.  Glucose, capillary     Status: Abnormal   Collection Time: 06/22/20  9:21 PM  Result Value Ref Range   Glucose-Capillary 153 (H) 70 - 99 mg/dL    Comment: Glucose reference range applies only to samples taken after fasting for at least 8 hours.   Comment 1 Notify RN   Glucose, capillary     Status: Abnormal   Collection Time: 06/23/20  8:04 AM  Result Value Ref Range   Glucose-Capillary 149 (H) 70 - 99 mg/dL    Comment: Glucose reference range applies only to samples taken after fasting for at least 8 hours.  Glucose, capillary     Status: Abnormal   Collection Time: 06/23/20  11:49 AM  Result Value Ref Range   Glucose-Capillary 181 (H) 70 - 99 mg/dL    Comment: Glucose reference range applies only to samples taken after fasting for at least 8 hours.  POCT Glucose (CBG)     Status: Abnormal   Collection Time: 06/26/20 12:25 PM  Result Value Ref Range   POC Glucose 282 (A) 70 - 99 mg/dl  Basic metabolic panel     Status: Abnormal   Collection Time: 06/26/20 12:26 PM  Result Value Ref Range   Sodium 132 (L) 135 - 145 mEq/L   Potassium 3.5 3.5 - 5.1 mEq/L   Chloride 95 (L) 96 - 112 mEq/L   CO2 26 19 - 32 mEq/L   Glucose, Bld 255 (H) 70 - 99 mg/dL   BUN 19 6 - 23 mg/dL   Creatinine, Ser 6.30 0.40 - 1.20 mg/dL   GFR 16.01 (L) >09.32 mL/min    Comment: Calculated using the CKD-EPI Creatinine Equation (2021)   Calcium 9.6 8.4 - 10.5 mg/dL  CBC with Differential/Platelet     Status: Abnormal   Collection Time: 06/26/20 12:26 PM  Result Value Ref Range   WBC 12.2 (H) 4.0 - 10.5 K/uL   RBC 4.72 3.87 - 5.11 Mil/uL   Hemoglobin 14.3 12.0 - 15.0 g/dL   HCT 35.5 73.2 - 20.2 %   MCV 90.0 78.0 - 100.0 fl   MCHC 33.6 30.0 - 36.0 g/dL   RDW 54.2 70.6 - 23.7 %   Platelets 278.0 150.0 - 400.0 K/uL   Neutrophils Relative % 75.6 43.0 - 77.0 %   Lymphocytes Relative 15.3 12.0 - 46.0 %   Monocytes Relative 7.4 3.0 - 12.0 %   Eosinophils Relative 1.1 0.0 - 5.0 %   Basophils Relative 0.6 0.0 - 3.0 %   Neutro Abs 9.2 (H) 1.4 - 7.7 K/uL   Lymphs Abs 1.9 0.7 - 4.0 K/uL   Monocytes Absolute 0.9 0.1 - 1.0 K/uL   Eosinophils Absolute 0.1 0.0 - 0.7 K/uL   Basophils Absolute 0.1 0.0 - 0.1 K/uL  Urinalysis, Routine w reflex microscopic     Status: Abnormal   Collection Time: 07/10/20  4:17 PM  Result Value Ref Range   Color, Urine YELLOW Yellow;Lt. Yellow;Straw;Dark Yellow;Amber;Green;Red;Brown   APPearance CLEAR Clear;Turbid;Slightly Cloudy;Cloudy   Specific Gravity, Urine <=1.005 (A) 1.000 - 1.030   pH 6.0 5.0 - 8.0   Total Protein, Urine NEGATIVE Negative   Urine Glucose  100 (A) Negative   Ketones, ur NEGATIVE Negative   Bilirubin Urine NEGATIVE Negative   Hgb urine dipstick NEGATIVE Negative   Urobilinogen, UA 0.2 0.0 - 1.0   Leukocytes,Ua NEGATIVE Negative   Nitrite NEGATIVE Negative  WBC, UA none seen 0-2/hpf   RBC / HPF none seen 0-2/hpf   Squamous Epithelial / LPF Rare(0-4/hpf) Rare(0-4/hpf)  Urine Culture     Status: None   Collection Time: 07/10/20  4:17 PM   Specimen: Urine  Result Value Ref Range   MICRO NUMBER: [id number]    SPECIMEN QUALITY: Adequate    Sample Source NOT GIVEN    STATUS: FINAL    ISOLATE 1:      Mixed genital flora isolated. These superficial bacteria are not indicative of a urinary tract infection. No further organism identification is warranted on this specimen. If clinically indicated, recollect clean-catch, mid-stream urine and transfer  immediately to Urine Culture Transport Tube.   Comprehensive metabolic panel     Status: Abnormal   Collection Time: 07/10/20  4:17 PM  Result Value Ref Range   Sodium 135 135 - 145 mEq/L   Potassium 4.2 3.5 - 5.1 mEq/L   Chloride 99 96 - 112 mEq/L   CO2 30 19 - 32 mEq/L   Glucose, Bld 178 (H) 70 - 99 mg/dL   BUN 18 6 - 23 mg/dL   Creatinine, Ser 8.65 0.40 - 1.20 mg/dL   Total Bilirubin 0.4 0.2 - 1.2 mg/dL   Alkaline Phosphatase 50 39 - 117 U/L   AST 11 0 - 37 U/L   ALT 17 0 - 35 U/L   Total Protein 6.4 6.0 - 8.3 g/dL   Albumin 4.1 3.5 - 5.2 g/dL   GFR 78.46 >96.29 mL/min    Comment: Calculated using the CKD-EPI Creatinine Equation (2021)   Calcium 9.7 8.4 - 10.5 mg/dL  CBC with Differential/Platelet     Status: None   Collection Time: 07/10/20  4:17 PM  Result Value Ref Range   WBC 9.9 4.0 - 10.5 K/uL   RBC 4.12 3.87 - 5.11 Mil/uL   Hemoglobin 12.4 12.0 - 15.0 g/dL   HCT 52.8 41.3 - 24.4 %   MCV 90.5 78.0 - 100.0 fl   MCHC 33.2 30.0 - 36.0 g/dL   RDW 01.0 27.2 - 53.6 %   Platelets 276.0 150.0 - 400.0 K/uL   Neutrophils Relative % 65.3 43.0 - 77.0 %   Lymphocytes  Relative 24.8 12.0 - 46.0 %   Monocytes Relative 6.9 3.0 - 12.0 %   Eosinophils Relative 2.1 0.0 - 5.0 %   Basophils Relative 0.9 0.0 - 3.0 %   Neutro Abs 6.5 1.4 - 7.7 K/uL   Lymphs Abs 2.5 0.7 - 4.0 K/uL   Monocytes Absolute 0.7 0.1 - 1.0 K/uL   Eosinophils Absolute 0.2 0.0 - 0.7 K/uL   Basophils Absolute 0.1 0.0 - 0.1 K/uL  Pregnancy, urine POC     Status: None   Collection Time: 07/25/20  7:20 AM  Result Value Ref Range   Preg Test, Ur NEGATIVE NEGATIVE    Comment:        THE SENSITIVITY OF THIS METHODOLOGY IS >24 mIU/mL   Glucose, capillary     Status: Abnormal   Collection Time: 07/25/20  7:33 AM  Result Value Ref Range   Glucose-Capillary 178 (H) 70 - 99 mg/dL    Comment: Glucose reference range applies only to samples taken after fasting for at least 8 hours.   Objective  Body mass index is 33.37 kg/m. Wt Readings from Last 3 Encounters:  07/26/20 232 lb 9.6 oz (105.5 kg)  07/25/20 239 lb (108.4 kg)  07/10/20 238 lb 9.6 oz (108.2 kg)   Temp Readings  from Last 3 Encounters:  07/26/20 97.8 F (36.6 C) (Oral)  07/25/20 (!) 96.4 F (35.8 C) (Temporal)  07/10/20 97.9 F (36.6 C)   BP Readings from Last 3 Encounters:  07/26/20 130/78  07/25/20 (!) 137/100  07/10/20 136/80   Pulse Readings from Last 3 Encounters:  07/26/20 88  07/25/20 81  07/10/20 81    Physical Exam Vitals and nursing note reviewed.  Constitutional:      Appearance: Normal appearance. She is well-developed and well-groomed. She is obese.  HENT:     Head: Normocephalic and atraumatic.  Cardiovascular:     Rate and Rhythm: Normal rate and regular rhythm.     Heart sounds: Normal heart sounds. No murmur heard.   Pulmonary:     Effort: Pulmonary effort is normal.     Breath sounds: Normal breath sounds.  Skin:    General: Skin is warm and dry.  Neurological:     General: No focal deficit present.     Mental Status: She is alert and oriented to person, place, and time. Mental status  is at baseline.     Gait: Gait normal.  Psychiatric:        Attention and Perception: Attention and perception normal.        Mood and Affect: Mood and affect normal.        Speech: Speech normal.        Behavior: Behavior normal. Behavior is cooperative.        Thought Content: Thought content normal.        Cognition and Memory: Cognition and memory normal.        Judgment: Judgment normal.     Assessment  Plan  Hypertension associated with diabetes (HCC) - Plan: losartan-hydrochlorothiazide (HYZAAR) 50-12.5 MG tablet 1/2 pill x 3 days if BP still elevated increase 1 pill qd norvasc 5 mg qd  Coreg 3.125 mg bid  Stop quinapril 20 mg qd Stop rybelsus 3 mg qd  Avoid jardiance 25 mg qd  Continuous Blood Gluc Receiver (FREESTYLE LIBRE 2 READER) DEVI, Basic Metabolic Panel (BMET), Lipid panel, Glutamic acid decarboxylase auto abs In 2 weeks   Plan: Ambulatory referral to Endocrinology  Insulin Glargine (BASAGLAR KWIKPEN) 100 UNIT/ML, Glutamic acid decarboxylase auto abs   baslaglar 40 units daily with food increase every 3 days by 3 units for goal am sugar 90 to <130 am. Stop rybelsus  Provider: Dr. French Ana McLean-Scocuzza-Internal Medicine

## 2020-08-09 ENCOUNTER — Other Ambulatory Visit: Payer: BC Managed Care – PPO

## 2020-09-08 ENCOUNTER — Other Ambulatory Visit: Payer: Self-pay | Admitting: Family

## 2020-09-25 ENCOUNTER — Encounter: Payer: Self-pay | Admitting: Family

## 2020-09-27 ENCOUNTER — Other Ambulatory Visit: Payer: Self-pay | Admitting: Family

## 2020-09-30 ENCOUNTER — Ambulatory Visit: Payer: BC Managed Care – PPO | Admitting: Family

## 2020-10-18 ENCOUNTER — Other Ambulatory Visit: Payer: Self-pay

## 2020-10-18 ENCOUNTER — Encounter: Payer: Self-pay | Admitting: Internal Medicine

## 2020-10-18 ENCOUNTER — Ambulatory Visit: Payer: BC Managed Care – PPO | Admitting: Internal Medicine

## 2020-10-18 VITALS — BP 126/82 | HR 88 | Temp 97.2°F | Ht 70.0 in | Wt 238.6 lb

## 2020-10-18 DIAGNOSIS — E1159 Type 2 diabetes mellitus with other circulatory complications: Secondary | ICD-10-CM

## 2020-10-18 DIAGNOSIS — R002 Palpitations: Secondary | ICD-10-CM | POA: Diagnosis not present

## 2020-10-18 DIAGNOSIS — I152 Hypertension secondary to endocrine disorders: Secondary | ICD-10-CM

## 2020-10-18 NOTE — Patient Instructions (Addendum)
[phone] Not available [place] [address]      Specialties     Cardiology, Radiology             Hypoglycemia Hypoglycemia occurs when the level of sugar (glucose) in the blood is too low. Hypoglycemia can happen in people who have or do not have diabetes. It can develop quickly, and it can be a medical emergency. For most people, a blood glucose level below 70 mg/dL (3.9 mmol/L) is consideredhypoglycemia. Glucose is a type of sugar that provides the body's main source of energy. Certain hormones (insulin and glucagon) control the level of glucose in the blood. Insulin lowers blood glucose, and glucagon raises blood glucose. Hypoglycemia can result from having too much insulin in the bloodstream, or from not eating enough food that contains glucose. You may also have reactive hypoglycemia, which happens within 4 hoursafter eating a meal. What are the causes? Hypoglycemia occurs most often in people who have diabetes and may be caused by: Diabetes medicine. Not eating enough, or not eating often enough. Increased physical activity. Drinking alcohol on an empty stomach. If you do not have diabetes, hypoglycemia may be caused by: A tumor in the pancreas. Not eating enough, or not eating for long periods at a time (fasting). A severe infection or illness. Problems after having bariatric surgery. Organ failure, such as kidney or liver failure. Certain medicines. What increases the risk? Hypoglycemia is more likely to develop in people who: Have diabetes and take medicines to lower blood glucose. Abuse alcohol. Have a severe illness. What are the signs or symptoms? Symptoms vary depending on whether the condition is mild, moderate, or severe. Mild hypoglycemia Hunger. Sweating and feeling clammy. Dizziness or feeling light-headed. Sleepiness or restless sleep. Nausea. Increased heart rate. Headache. Blurry vision. Mood changes, such as  irritability or anxiety. Tingling or numbness around the mouth, lips, or tongue. Moderate hypoglycemia Confusion and poor judgment. Behavior changes. Weakness. Irregular heartbeat. A change in coordination. Severe hypoglycemia Severe hypoglycemia is a medical emergency. It can cause: Fainting. Seizures. Loss of consciousness (coma). Death. How is this diagnosed? Hypoglycemia is diagnosed with a blood test to measure your blood glucose level. This blood test is done while you are having symptoms. Your health careprovider may also do a physical exam and review your medical history. How is this treated? This condition can be treated by immediately eating or drinking something that contains sugar with 15 grams of fast-acting carbohydrate, such as: 4 oz (120 mL) of fruit juice. 4 oz (120 mL) of regular soda (not diet soda). Several pieces of hard candy. Check food labels to find out how many pieces to eat for 15 grams. 1 Tbsp (15 mL) of sugar or honey. 4 glucose tablets. 1 tube of glucose gel. Treating hypoglycemia if you have diabetes If you are alert and able to swallow safely, follow the 15:15 rule: Take 15 grams of a fast-acting carbohydrate. Talk with your health care provider about how much you should take. Options for getting 15 grams of fast-acting carbohydrate include: Glucose tablets (take 4 tablets). Several pieces of hard candy. Check food labels to find out how many pieces to eat for 15 grams. 4 oz (120 mL) of fruit juice. 4 oz (120 mL) of regular soda (not diet soda). 1 Tbsp (15 mL) of sugar or honey. 1 tube of glucose gel. Check your blood glucose 15 minutes after you take the carbohydrate. If the repeat blood glucose level  is still at or below 70 mg/dL (3.9 mmol/L), take 15 grams of a carbohydrate again. If your blood glucose level does not increase above 70 mg/dL (3.9 mmol/L) after 3 tries, seek emergency medical care. After your blood glucose level returns to normal,  eat a meal or a snack within 1 hour.  Treating severe hypoglycemia Severe hypoglycemia is when your blood glucose level is below 54 mg/dL (3 mmol/L). Severe hypoglycemia is a medical emergency. Get medical help right away. If you have severe hypoglycemia and you cannot eat or drink, you will need to be given glucagon. A family member or close friend should learn how to check your blood glucose and how to give you glucagon. Ask your health care providerif you need to have an emergency glucagon kit available. Severe hypoglycemia may need to be treated in a hospital. The treatment may include getting glucose through an IV. You may also need treatment for thecause of your hypoglycemia. Follow these instructions at home:  General instructions Take over-the-counter and prescription medicines only as told by your health care provider. Monitor your blood glucose as told by your health care provider. If you drink alcohol: Limit how much you have to: 0-1 drink a day for women who are not pregnant. 0-2 drinks a day for men. Know how much alcohol is in your drink. In the [place] one drink equals one 12 oz bottle of beer (355 mL), one 5 oz glass of wine (148 mL), or one 1 oz glass of hard liquor (44 mL). Be sure to eat food along with drinking alcohol. Be aware that alcohol is absorbed quickly and may have lingering effects that may result in hypoglycemia later. Be sure to do ongoing glucose monitoring. Keep all follow-up visits. This is important. If you have diabetes: Always have a fast-acting carbohydrate (15 grams) option with you to treat low blood glucose. Follow your diabetes management plan as directed by your health care provider. Make sure you: Know the symptoms of hypoglycemia. It is important to treat it right away to prevent it from becoming severe. Check your blood glucose as often as told. Always check before and after exercise. Always check your blood glucose before you drive a motorized  vehicle. Take your medicines as told. Follow your meal plan. Eat on time, and do not skip meals. Share your diabetes management plan with people in your workplace, school, and household. Carry a medical alert card or wear medical alert jewelry. Where to find more information American Diabetes Association: www.diabetes.org Contact a health care provider if: You have problems keeping your blood glucose in your target range. You have frequent episodes of hypoglycemia. Get help right away if: You continue to have hypoglycemia symptoms after eating or drinking something that contains 15 grams of fast-acting carbohydrate, and you cannot get your blood glucose above 70 mg/dL (3.9 mmol/L) while following the 15:15 rule. Your blood glucose is below 54 mg/dL (3 mmol/L). You have a seizure. You faint. These symptoms may represent a serious problem that is an emergency. Do not wait to see if the symptoms will go away. Get medical help right away. Call your local emergency services (911 in the [place] Do not drive yourself to the hospital. Summary Hypoglycemia occurs when the level of sugar (glucose) in the blood is too low. Hypoglycemia can happen in people who have or do not have diabetes. It can develop quickly, and it can be a medical emergency. Make sure you know the symptoms of hypoglycemia and how  to treat it. Always have a fast-acting carbohydrate option with you to treat low blood sugar. This information is not intended to replace advice given to you by your health care provider. Make sure you discuss any questions you have with your healthcare provider. Document Revised: 02/08/2020 Document Reviewed: 02/08/2020 Elsevier Patient Education  2022 [employer].  Palpitations Palpitations are feelings that your heartbeat is irregular or is faster than normal. It may feel like your heart is fluttering or skipping a beat. Palpitations are usually not a serious problem. They may be caused by many  things, including smoking, caffeine, alcohol, stress, and certain medicines or drugs. Most causes of palpitations are not serious. However, some palpitations can be a sign of a serious problem. You may need further tests to rule outserious medical problems. Follow these instructions at home:     Pay attention to any changes in your condition. Take these actions to helpmanage your symptoms: Eating and drinking Avoid foods and drinks that may cause palpitations. These may include: Caffeinated coffee, tea, soft drinks, diet pills, and energy drinks. Chocolate. Alcohol. Lifestyle Take steps to reduce your stress and anxiety. Things that can help you relax include: Yoga. Mind-body activities, such as deep breathing, meditation, or using words and images to create positive thoughts (guided imagery). Physical activity, such as swimming, jogging, or walking. Tell your health care provider if your palpitations increase with activity. If you have chest pain or shortness of breath with activity, do not continue the activity until you are seen by your health care provider. Biofeedback. This is a method that helps you learn to use your mind to control things in your body, such as your heartbeat. Do not use drugs, including cocaine or ecstasy. Do not use marijuana. Get plenty of rest and sleep. Keep a regular bed time. General instructions Take over-the-counter and prescription medicines only as told by your health care provider. Do not use any products that contain nicotine or tobacco, such as cigarettes and e-cigarettes. If you need help quitting, ask your health care provider. Keep all follow-up visits as told by your health care provider. This is important. These may include visits for further testing if palpitations do not go away or get worse. Contact a health care provider if you: Continue to have a fast or irregular heartbeat after 24 hours. Notice that your palpitations occur more often. Get  help right away if you: Have chest pain or shortness of breath. Have a severe headache. Feel dizzy or you faint. Summary Palpitations are feelings that your heartbeat is irregular or is faster than normal. It may feel like your heart is fluttering or skipping a beat. Palpitations may be caused by many things, including smoking, caffeine, alcohol, stress, certain medicines, and drugs. Although most causes of palpitations are not serious, some causes can be a sign of a serious medical problem. Get help right away if you faint or have chest pain, shortness of breath, a severe headache, or dizziness. This information is not intended to replace advice given to you by your health care provider. Make sure you discuss any questions you have with your healthcare provider. Document Revised: 04/21/2017 Document Reviewed: 04/21/2017 Elsevier Patient Education  2022 [employer].

## 2020-10-18 NOTE — Progress Notes (Signed)
Chief Complaint  Patient presents with   Palpitations   F/u  1. Palpations when relaxing chest x few months since hospital discharge in 06/2020 no chest pain no sob normal ekg at work [employer] [facility], no dizziness, h/o palpitations in 2018 and saw cards at that time. Pentamidine sprayed at work at [employer] transplant and wonders if this could cause this side effect reviewed <1% change of palpitations with this med  Ekg today nsr. Cards appt 11/12/20 will reach out and see if they can get zio before her appt    Review of Systems  Respiratory:  Negative for shortness of breath.   Cardiovascular:  Positive for palpitations. Negative for chest pain.  Neurological:  Negative for dizziness.  Past Medical History:  Diagnosis Date   Chicken pox    Diabetes mellitus    dx 50, dx 06/19/20   GERD (gastroesophageal reflux disease)    Hyperlipidemia    Hypertension    UTI (lower urinary tract infection)    Past Surgical History:  Procedure Laterality Date   ABLATION  2007   excessive bleeding   CESAREAN SECTION  1994   CHOLECYSTECTOMY  1993   COLONOSCOPY WITH PROPOFOL N/A 07/25/2020   Procedure: COLONOSCOPY WITH PROPOFOL;  Surgeon: Wyline Mood, MD;  Location[facility] ENDOSCOPY;  Service: Gastroenterology;  Laterality: N/A;   ESOPHAGOGASTRODUODENOSCOPY N/A 06/22/2020   Procedure: ESOPHAGOGASTRODUODENOSCOPY (EGD);  Surgeon: Toledo, Boykin Nearing, MD;  Location: [facility] ENDOSCOPY;  Service: Gastroenterology;  Laterality: N/A;   ORIF FEMUR FRACTURE Right    [place]   TUBAL LIGATION     uterine ablation     Family History  Problem Relation Age of Onset   Heart disease Father    Hyperlipidemia Father    Diabetes Father    Hyperlipidemia Mother    Hypertension Mother    Schizophrenia Mother    Depression Mother    Diabetes Mother    Heart disease Other    Cancer Maternal Aunt        breast   Diabetes Maternal Grandmother    Diabetes Maternal Grandfather    Thyroid cancer Neg Hx    Social History    Socioeconomic History   Marital status: Married    Spouse name: Not on file   Number of children: Not on file   Years of education: Not on file   Highest education level: Not on file  Occupational History   Not on file  Tobacco Use   Smoking status: Former    Packs/day: 0.50    Years: 10.00    Pack years: 5.00    Types: Cigarettes    Quit date: 06/25/2020    Years since quitting: 0.3   Smokeless tobacco: Never   Tobacco comments:    smoked < 1 pack a day  Vaping Use   Vaping Use: Never used  Substance and Sexual Activity   Alcohol use: Yes    Alcohol/week: 0.0 standard drinks    Comment: weekend 3-4drinks   Drug use: No   Sexual activity: Yes    Birth control/protection: Surgical    Comment: tubal ligation  Other Topics Concern   Not on file  Social History Narrative   Lives in [place].      Work - [employer]      Diet - regular   Social Determinants of Health   Financial Resource Strain: Not on file  Food Insecurity: Not on file  Transportation Needs: Not on file  Physical Activity: Not on  file  Stress: Not on file  Social Connections: Not on file  Intimate Partner Violence: Not on file   Current Meds  Medication Sig   amLODipine (NORVASC) 5 MG tablet Take 1 tablet (5 mg total) by mouth daily.   carvedilol (COREG) 3.125 MG tablet Take 1 tablet (3.125 mg total) by mouth 2 (two) times a day with meals.   Continuous Blood Gluc Receiver (FREESTYLE LIBRE 2 READER) DEVI 1 Device by Does not apply route every 14 (fourteen) days.   Continuous Blood Gluc Sensor (FREESTYLE LIBRE 2 SENSOR) MISC Place once sensor every 14 days.   Insulin Glargine (BASAGLAR KWIKPEN) 100 UNIT/ML 40 units daily with food increase every 3 days by 3 units for goal am sugar 90 to <130 am. Stop rybelsus (Patient taking differently: Inject 50 Units into the skin daily. 40 units daily with food increase every 3 days by 3 units for goal am sugar 90 to <130 am. Stop rybelsus)    losartan-hydrochlorothiazide (HYZAAR) 100-25 MG tablet Take 1 tablet by mouth daily.   metFORMIN (GLUCOPHAGE-XR) 500 MG 24 hr tablet Take 2 tablets (1,000 mg total) by mouth 2 (two) times daily with a meal.   Multiple Vitamins-Minerals (MULTIVITAMIN WITH MINERALS) tablet Take 1 tablet by mouth daily.   pantoprazole (PROTONIX) 40 MG tablet Take 1 tablet (40 mg total) by mouth daily.   rosuvastatin (CRESTOR) 20 MG tablet Take 1 tablet (20 mg total) by mouth every other day.   Semaglutide,0.25 or 0.5MG /DOS, (OZEMPIC, 0.25 OR 0.5 MG/DOSE,) 2 MG/1.5ML SOPN Inject 0.5 mg into the skin once a week.   Allergies  Allergen Reactions   Contrast Media [Iodinated Diagnostic Agents] Anaphylaxis    Wheezing and rash immediately after CT w/.   Statins Palpitations   Jardiance [Empagliflozin]     DKA hospitalized 05/2020   Bactrim [Sulfamethoxazole-Trimethoprim] Itching    itching   Penicillins Rash   Trulicity [Dulaglutide] Nausea Only   Recent Results (from the past 2160 hour(s))  Pregnancy, urine POC     Status: None   Collection Time: 07/25/20  7:20 AM  Result Value Ref Range   Preg Test, Ur NEGATIVE NEGATIVE    Comment:        THE SENSITIVITY OF THIS METHODOLOGY IS >24 mIU/mL   Glucose, capillary     Status: Abnormal   Collection Time: 07/25/20  7:33 AM  Result Value Ref Range   Glucose-Capillary 178 (H) 70 - 99 mg/dL    Comment: Glucose reference range applies only to samples taken after fasting for at least 8 hours.   Objective  Body mass index is 34.24 kg/m. Wt Readings from Last 3 Encounters:  10/18/20 238 lb 9.6 oz (108.2 kg)  07/26/20 232 lb 9.6 oz (105.5 kg)  07/25/20 239 lb (108.4 kg)   Temp Readings from Last 3 Encounters:  10/18/20 (!) 97.2 F (36.2 C) (Temporal)  07/26/20 97.8 F (36.6 C) (Oral)  07/25/20 (!) 96.4 F (35.8 C) (Temporal)   BP Readings from Last 3 Encounters:  10/18/20 126/82  07/26/20 130/78  07/25/20 (!) 137/100   Pulse Readings from Last 3  Encounters:  10/18/20 88  07/26/20 88  07/25/20 81    Physical Exam Vitals and nursing note reviewed.  Constitutional:      Appearance: Normal appearance. She is well-developed and well-groomed. She is obese.  HENT:     Head: Normocephalic and atraumatic.  Eyes:     Conjunctiva/sclera: Conjunctivae normal.     Pupils: Pupils are equal,  round, and reactive to light.  Cardiovascular:     Rate and Rhythm: Normal rate and regular rhythm.     Heart sounds: Normal heart sounds. No murmur heard. Pulmonary:     Effort: Pulmonary effort is normal.     Breath sounds: Normal breath sounds.  Skin:    General: Skin is warm and dry.  Neurological:     General: No focal deficit present.     Mental Status: She is alert and oriented to person, place, and time. Mental status is at baseline.     Gait: Gait normal.  Psychiatric:        Attention and Perception: Attention and perception normal.        Mood and Affect: Mood and affect normal.        Speech: Speech normal.        Behavior: Behavior normal. Behavior is cooperative.        Thought Content: Thought content normal.        Cognition and Memory: Cognition and memory normal.        Judgment: Judgment normal.    Assessment  Plan  Palpitations Palpitation - Plan: EKG 12-Lead NSR Cc'ed leb cards to see if can get holter before 11/12/20 appt   Hypertension associated with diabetes (HCC) - Plan: Comprehensive metabolic panel, Lipid panel, Hemoglobin A1c, CBC with Differential/Platelet  F/u PCP and [facility] endocrine  Labs fasting mid 10/2020   Provider: Dr. French Ana McLean-Scocuzza-Internal Medicine

## 2020-10-23 ENCOUNTER — Ambulatory Visit (INDEPENDENT_AMBULATORY_CARE_PROVIDER_SITE_OTHER): Payer: BC Managed Care – PPO

## 2020-10-23 ENCOUNTER — Telehealth: Payer: Self-pay

## 2020-10-23 DIAGNOSIS — R002 Palpitations: Secondary | ICD-10-CM

## 2020-10-23 NOTE — Telephone Encounter (Signed)
-----   Message from Debbe Odea, MD sent at 10/21/2020  1:03 PM EDT ----- Please place cardiac monitor x 2 weeks for palpitations.   Thks BA ----- Message ----- From: McLean-Scocuzza, Pasty Spillers, MD Sent: 10/18/2020   3:13 PM EDT To: Debbe Odea, MD  Can pt have nurse visit before 11/12/20 visit with you needs holter for palpitations this way info may be back by appt with you ?   Thank you

## 2020-10-23 NOTE — Telephone Encounter (Signed)
Called patient back to reschedule follow up to accommodate the resulting of her zio monitor. Appointment changed from 11/12/20 to 11/28/20. Patient agreed with the plan.

## 2020-10-23 NOTE — Telephone Encounter (Signed)
Called and spoke with patient about wearing a cardiac monitor and informed her that I would be sending one out to her. Verified patients address as address on file. Patient was agreeable, verbalized understanding and agreed with plan.

## 2020-10-28 DIAGNOSIS — R002 Palpitations: Secondary | ICD-10-CM

## 2020-10-29 ENCOUNTER — Encounter: Payer: Self-pay | Admitting: Family

## 2020-11-06 ENCOUNTER — Encounter: Payer: Self-pay | Admitting: Family

## 2020-11-07 ENCOUNTER — Other Ambulatory Visit: Payer: BC Managed Care – PPO

## 2020-11-08 ENCOUNTER — Ambulatory Visit: Payer: BC Managed Care – PPO | Admitting: Family

## 2020-11-12 ENCOUNTER — Ambulatory Visit: Payer: BC Managed Care – PPO | Admitting: Cardiology

## 2020-11-28 ENCOUNTER — Ambulatory Visit (INDEPENDENT_AMBULATORY_CARE_PROVIDER_SITE_OTHER): Payer: BC Managed Care – PPO | Admitting: Cardiology

## 2020-11-28 ENCOUNTER — Other Ambulatory Visit: Payer: Self-pay

## 2020-11-28 ENCOUNTER — Other Ambulatory Visit (INDEPENDENT_AMBULATORY_CARE_PROVIDER_SITE_OTHER): Payer: BC Managed Care – PPO

## 2020-11-28 ENCOUNTER — Encounter: Payer: Self-pay | Admitting: Cardiology

## 2020-11-28 VITALS — BP 114/60 | HR 93 | Ht 70.0 in | Wt 240.0 lb

## 2020-11-28 DIAGNOSIS — E7800 Pure hypercholesterolemia, unspecified: Secondary | ICD-10-CM | POA: Diagnosis not present

## 2020-11-28 DIAGNOSIS — I498 Other specified cardiac arrhythmias: Secondary | ICD-10-CM | POA: Diagnosis not present

## 2020-11-28 DIAGNOSIS — I152 Hypertension secondary to endocrine disorders: Secondary | ICD-10-CM | POA: Diagnosis not present

## 2020-11-28 DIAGNOSIS — I10 Essential (primary) hypertension: Secondary | ICD-10-CM | POA: Diagnosis not present

## 2020-11-28 DIAGNOSIS — E1159 Type 2 diabetes mellitus with other circulatory complications: Secondary | ICD-10-CM | POA: Diagnosis not present

## 2020-11-28 LAB — LIPID PANEL
Cholesterol: 137 mg/dL (ref 0–200)
HDL: 47.5 mg/dL (ref >39.00)
LDL Cholesterol: 55 mg/dL (ref 0–99)
NonHDL: 89.09
Total CHOL/HDL Ratio: 3
Triglycerides: 169 mg/dL — ABNORMAL HIGH (ref 0.0–149.0)
VLDL: 33.8 mg/dL (ref 0.0–40.0)

## 2020-11-28 LAB — COMPREHENSIVE METABOLIC PANEL
ALT: 22 U/L (ref 0–35)
AST: 15 U/L (ref 0–37)
Albumin: 4.1 g/dL (ref 3.5–5.2)
Alkaline Phosphatase: 64 U/L (ref 39–117)
BUN: 13 mg/dL (ref 6–23)
CO2: 28 mEq/L (ref 19–32)
Calcium: 9.4 mg/dL (ref 8.4–10.5)
Chloride: 99 mEq/L (ref 96–112)
Creatinine, Ser: 0.76 mg/dL (ref 0.40–1.20)
GFR: 91.96 mL/min (ref >60.00)
Glucose, Bld: 208 mg/dL — ABNORMAL HIGH (ref 70–99)
Potassium: 4.2 mEq/L (ref 3.5–5.1)
Sodium: 136 mEq/L (ref 135–145)
Total Bilirubin: 0.8 mg/dL (ref 0.2–1.2)
Total Protein: 6.7 g/dL (ref 6.0–8.3)

## 2020-11-28 LAB — CBC WITH DIFFERENTIAL/PLATELET
Basophils Absolute: 0 10*3/uL (ref 0.0–0.1)
Basophils Relative: 0.5 % (ref 0.0–3.0)
Eosinophils Absolute: 0.1 10*3/uL (ref 0.0–0.7)
Eosinophils Relative: 1.4 % (ref 0.0–5.0)
HCT: 36.5 % (ref 36.0–46.0)
Hemoglobin: 12.6 g/dL (ref 12.0–15.0)
Lymphocytes Relative: 23.8 % (ref 12.0–46.0)
Lymphs Abs: 2.2 10*3/uL (ref 0.7–4.0)
MCHC: 34.4 g/dL (ref 30.0–36.0)
MCV: 88 fl (ref 78.0–100.0)
Monocytes Absolute: 0.6 10*3/uL (ref 0.1–1.0)
Monocytes Relative: 6.8 % (ref 3.0–12.0)
Neutro Abs: 6.2 10*3/uL (ref 1.4–7.7)
Neutrophils Relative %: 67.5 % (ref 43.0–77.0)
Platelets: 277 10*3/uL (ref 150.0–400.0)
RBC: 4.15 Mil/uL (ref 3.87–5.11)
RDW: 13.3 % (ref 11.5–15.5)
WBC: 9.1 10*3/uL (ref 4.0–10.5)

## 2020-11-28 LAB — HEMOGLOBIN A1C: Hgb A1c MFr Bld: 8.2 % — ABNORMAL HIGH (ref 4.6–6.5)

## 2020-11-28 NOTE — Patient Instructions (Signed)
Medication Instructions:  Your physician recommends that you continue on your current medications as directed. Please refer to the Current Medication list given to you today.  *If you need a refill on your cardiac medications before your next appointment, please call your pharmacy*   Lab Work: None ordered If you have labs (blood work) drawn today and your tests are completely normal, you will receive your results only by: MyChart Message (if you have MyChart) OR A paper copy in the mail If you have any lab test that is abnormal or we need to change your treatment, we will call you to review the results.   Testing/Procedures: None ordered   Follow-Up: At [facility], you and your health needs are our priority.  As part of our continuing mission to provide you with exceptional heart care, we have created designated Provider Care Teams.  These Care Teams include your primary Cardiologist (physician) and Advanced Practice Providers (APPs -  Physician Assistants and Nurse Practitioners) who all work together to provide you with the care you need, when you need it.  We recommend signing up for the patient portal called "MyChart".  Sign up information is provided on this After Visit Summary.  MyChart is used to connect with patients for Virtual Visits (Telemedicine).  Patients are able to view lab/test results, encounter notes, upcoming appointments, etc.  Non-urgent messages can be sent to your provider as well.   To learn more about what you can do with MyChart, go to [email or web address].    Your next appointment:   Follow up as needed   The format for your next appointment:   In Person  Provider:   You may see Dr. Agbor-Etang or one of the following Advanced Practice Providers on your designated Care Team:   Christopher Berge, NP Ryan Dunn, PA-C Jacquelyn Visser, PA-C Cadence Furth, PA-C   Other Instructions   

## 2020-11-28 NOTE — Progress Notes (Signed)
Cardiology Office Note:    Date:  11/28/2020   ID:  [name], DOB 01-Oct-1970, MRN [id number]  PCP:  Allegra Grana, FNP   [facility] Providers Cardiologist:  None     Referring MD: Allegra Grana, FNP   Chief Complaint  Patient presents with   New Patient (Initial Visit)    Self referral -- Patient c.o having a fluttering feeling. Meds reviewed verbally with patient.    Julia Brown is a 50 y.o. female who is being seen today for the evaluation of heart flutters at the request of Jason Coop Lyn Records, FNP.   History of Present Illness:    Julia Brown is a 50 y.o. female with a hx of diabetes, hypertension, hyperlipidemia who presents due to heart flutters.  Patient states having palpitations/heart flutters over the past 5 to 6 months.  She was recently diagnosed with DKA, admitted in March 2022.  Upon discharge, she was started on Jardiance for better blood sugar control.  States having palpitations on and off, Jardiance was stopped, Ozempic was started by her endocrinologist.  She still noticed palpitations after she takes first dose of Ozempic.  Symptoms improved over the subsequent days, until she takes another dose.  Denies dizziness, chest pain, shortness of breath, presyncope or syncope.  She thinks her symptoms might also be stress related due to her being in the process of buying a house, trying to help her kids move, family members who are ill.  Cardiac monitor was ordered by myself x2 weeks to evaluate any significant arrhythmias.  Patient will monitor for about 8 to 10 days, she states having similar symptoms of heart flutters while wearing monitor.  Past Medical History:  Diagnosis Date   Chicken pox    Diabetes mellitus    dx 79, dx 06/19/20   GERD (gastroesophageal reflux disease)    Hyperlipidemia    Hypertension    UTI (lower urinary tract infection)     Past Surgical History:  Procedure Laterality Date   ABLATION  2007   excessive  bleeding   CESAREAN SECTION  1994   CHOLECYSTECTOMY  1993   COLONOSCOPY WITH PROPOFOL N/A 07/25/2020   Procedure: COLONOSCOPY WITH PROPOFOL;  Surgeon: Wyline Mood, MD;  Location: [facility] ENDOSCOPY;  Service: Gastroenterology;  Laterality: N/A;   ESOPHAGOGASTRODUODENOSCOPY N/A 06/22/2020   Procedure: ESOPHAGOGASTRODUODENOSCOPY (EGD);  Surgeon: Toledo, Boykin Nearing, MD;  Location: [facility] ENDOSCOPY;  Service: Gastroenterology;  Laterality: N/A;   ORIF FEMUR FRACTURE Right    [place]   TUBAL LIGATION     uterine ablation      Current Medications: Current Meds  Medication Sig   amLODipine (NORVASC) 5 MG tablet Take 1 tablet (5 mg total) by mouth daily.   carvedilol (COREG) 3.125 MG tablet Take 1 tablet (3.125 mg total) by mouth 2 (two) times a day with meals.   Continuous Blood Gluc Receiver (FREESTYLE LIBRE 2 READER) DEVI 1 Device by Does not apply route every 14 (fourteen) days.   Continuous Blood Gluc Sensor (FREESTYLE LIBRE 2 SENSOR) MISC Place once sensor every 14 days.   Insulin Glargine (BASAGLAR KWIKPEN) 100 UNIT/ML 40 units daily with food increase every 3 days by 3 units for goal am sugar 90 to <130 am. Stop rybelsus   losartan-hydrochlorothiazide (HYZAAR) 100-25 MG tablet Take 1 tablet by mouth daily.   metFORMIN (GLUCOPHAGE-XR) 500 MG 24 hr tablet Take 2 tablets (1,000 mg total) by mouth 2 (two) times daily with a meal.  metoCLOPramide (REGLAN) 10 MG tablet Take 1 tablet (10 mg total) by mouth every 8 (eight) hours as needed for up to 4 days for nausea or vomiting.   Multiple Vitamins-Minerals (MULTIVITAMIN WITH MINERALS) tablet Take 1 tablet by mouth daily.   pantoprazole (PROTONIX) 40 MG tablet Take 1 tablet (40 mg total) by mouth daily.   rosuvastatin (CRESTOR) 20 MG tablet Take 1 tablet (20 mg total) by mouth every other day.   Semaglutide,0.25 or 0.5MG /DOS, (OZEMPIC, 0.25 OR 0.5 MG/DOSE,) 2 MG/1.5ML SOPN Inject 0.5 mg into the skin once a week.     Allergies:   Contrast media  [iodinated diagnostic agents], Statins, Jardiance [empagliflozin], Bactrim [sulfamethoxazole-trimethoprim], Penicillins, and Trulicity [dulaglutide]   Social History   Socioeconomic History   Marital status: Married    Spouse name: Not on file   Number of children: Not on file   Years of education: Not on file   Highest education level: Not on file  Occupational History   Not on file  Tobacco Use   Smoking status: Former    Packs/day: 0.50    Years: 10.00    Pack years: 5.00    Types: Cigarettes    Quit date: 06/25/2020    Years since quitting: 0.4   Smokeless tobacco: Never   Tobacco comments:    smoked < 1 pack a day  Vaping Use   Vaping Use: Never used  Substance and Sexual Activity   Alcohol use: Yes    Alcohol/week: 0.0 standard drinks    Comment: weekend 3-4drinks   Drug use: No   Sexual activity: Yes    Birth control/protection: Surgical    Comment: tubal ligation  Other Topics Concern   Not on file  Social History Narrative   Lives in [place].      Work - [employer]      Diet - regular   Social Determinants of Corporate investment banker Strain: Not on file  Food Insecurity: Not on file  Transportation Needs: Not on file  Physical Activity: Not on file  Stress: Not on file  Social Connections: Not on file     Family History: The patient's family history includes Cancer in her maternal aunt; Depression in her mother; Diabetes in her father, maternal grandfather, maternal grandmother, and mother; Heart disease in her father and another family member; Hyperlipidemia in her father and mother; Hypertension in her mother; Schizophrenia in her mother. There is no history of Thyroid cancer.  ROS:   Please see the history of present illness.     All other systems reviewed and are negative.  EKGs/Labs/Other Studies Reviewed:    The following studies were reviewed today:   EKG:  EKG is  ordered today.  The ekg ordered today demonstrates normal  sinus rhythm, normal ECG.  Recent Labs: 06/19/2020: Magnesium 2.4 11/28/2020: ALT 22; BUN 13; Creatinine, Ser 0.76; Hemoglobin 12.6; Platelets 277.0; Potassium 4.2; Sodium 136  Recent Lipid Panel    Component Value Date/Time   CHOL 137 11/28/2020 0804   TRIG 169.0 (H) 11/28/2020 0804   HDL 47.50 11/28/2020 0804   CHOLHDL 3 11/28/2020 0804   VLDL 33.8 11/28/2020 0804   LDLCALC 55 11/28/2020 0804   LDLCALC 153 (H) 02/16/2018 1537   LDLDIRECT 145.0 01/05/2017 0803     Risk Assessment/Calculations:          Physical Exam:    VS:  BP 114/60 (BP Location: Left Arm, Patient Position: Sitting, Cuff Size: Normal)  Pulse 93   Ht 5\' 10"  (1.778 m)   Wt 240 lb (108.9 kg)   SpO2 98%   BMI 34.44 kg/m     Wt Readings from Last 3 Encounters:  11/28/20 240 lb (108.9 kg)  10/18/20 238 lb 9.6 oz (108.2 kg)  07/26/20 232 lb 9.6 oz (105.5 kg)     GEN:  Well nourished, well developed in no acute distress HEENT: Normal NECK: No JVD; No carotid bruits LYMPHATICS: No lymphadenopathy CARDIAC: RRR, no murmurs, rubs, gallops RESPIRATORY:  Clear to auscultation without rales, wheezing or rhonchi  ABDOMEN: Soft, non-tender, non-distended MUSCULOSKELETAL:  No edema; No deformity  SKIN: Warm and dry NEUROLOGIC:  Alert and oriented x 3 PSYCHIATRIC:  Normal affect   ASSESSMENT:    1. Fluttering heart   2. Primary hypertension   3. Pure hypercholesterolemia    PLAN:    In order of problems listed above:  Heart flutters, palpitations.  Cardiac monitor was normal with no arrhythmias.  Patient made aware of results, reassured.  Symptoms could be stress related.  I do not think her medications are causing these as Ozempic is not known to cause palpitations.  I encouraged patient to take her diabetes medications as prescribed as her from her recent A1c, her blood sugars are not well controlled.   Hypertension, BP controlled.  Continue current meds. Hyperlipidemia, continue Crestor.  Follow-up  as needed     Medication Adjustments/Labs and Tests Ordered: Current medicines are reviewed at length with the patient today.  Concerns regarding medicines are outlined above.  Orders Placed This Encounter  Procedures   EKG 12-Lead   No orders of the defined types were placed in this encounter.   Patient Instructions  Medication Instructions:  Your physician recommends that you continue on your current medications as directed. Please refer to the Current Medication list given to you today.  *If you need a refill on your cardiac medications before your next appointment, please call your pharmacy*   Lab Work: None ordered If you have labs (blood work) drawn today and your tests are completely normal, you will receive your results only by: MyChart Message (if you have MyChart) OR A paper copy in the mail If you have any lab test that is abnormal or we need to change your treatment, we will call you to review the results.   Testing/Procedures: None ordered   Follow-Up: At [facility], you and your health needs are our priority.  As part of our continuing mission to provide you with exceptional heart care, we have created designated Provider Care Teams.  These Care Teams include your primary Cardiologist (physician) and Advanced Practice Providers (APPs -  Physician Assistants and Nurse Practitioners) who all work together to provide you with the care you need, when you need it.  We recommend signing up for the patient portal called "MyChart".  Sign up information is provided on this After Visit Summary.  MyChart is used to connect with patients for Virtual Visits (Telemedicine).  Patients are able to view lab/test results, encounter notes, upcoming appointments, etc.  Non-urgent messages can be sent to your provider as well.   To learn more about what you can do with MyChart, go to [facility].    Your next appointment:   Follow up as needed   The format for your  next appointment:   In Person  Provider:   You may see Dr. [email or web address] or one of the following Advanced Practice Providers on your designated Care  Team:   Nicolasa Duckinghristopher Berge, NP Eula Listenyan Dunn, PA-C Marisue IvanJacquelyn Visser, PA-C Cadence Fransico MichaelFurth, [place] JerseyPA-C   Other Instructions     Signed, Debbe OdeaBrian Agbor-Etang, MD  11/28/2020 1:05 PM    [facility]

## 2020-11-29 ENCOUNTER — Ambulatory Visit (INDEPENDENT_AMBULATORY_CARE_PROVIDER_SITE_OTHER): Payer: BC Managed Care – PPO | Admitting: Family

## 2020-11-29 ENCOUNTER — Encounter: Payer: Self-pay | Admitting: Family

## 2020-11-29 ENCOUNTER — Ambulatory Visit (INDEPENDENT_AMBULATORY_CARE_PROVIDER_SITE_OTHER): Payer: BC Managed Care – PPO

## 2020-11-29 VITALS — BP 130/86 | HR 93 | Ht 70.0 in | Wt 241.4 lb

## 2020-11-29 DIAGNOSIS — M79641 Pain in right hand: Secondary | ICD-10-CM

## 2020-11-29 DIAGNOSIS — E1110 Type 2 diabetes mellitus with ketoacidosis without coma: Secondary | ICD-10-CM | POA: Diagnosis not present

## 2020-11-29 DIAGNOSIS — L0291 Cutaneous abscess, unspecified: Secondary | ICD-10-CM | POA: Insufficient documentation

## 2020-11-29 DIAGNOSIS — I10 Essential (primary) hypertension: Secondary | ICD-10-CM

## 2020-11-29 DIAGNOSIS — E785 Hyperlipidemia, unspecified: Secondary | ICD-10-CM

## 2020-11-29 MED ORDER — DOXYCYCLINE HYCLATE 100 MG PO TABS
100.0000 mg | ORAL_TABLET | Freq: Two times a day (BID) | ORAL | 0 refills | Status: DC
Start: 2020-11-29 — End: 2021-05-23

## 2020-11-29 NOTE — Assessment & Plan Note (Signed)
Symptoms most consistent with CMP joint osteoarthritis.  Advised Voltaren gel.  Pending inflammatory labs, x-ray.  Discussed consult with Dr. Stephenie Acres at [facility] if symptoms persist.

## 2020-11-29 NOTE — Assessment & Plan Note (Signed)
In the absence of fluctuance, advised patient that I would not perform incision and drainage as unlikely would yield any purulence from area.  Presentation is consistent with bacterial abscess and I have started her on doxycycline.  Advised her to take probiotics.  Over the weekend if abscess were to enlarge and had advised her to return for reevaluation at [facility] as she may require incision and drainage at that point.

## 2020-11-29 NOTE — Assessment & Plan Note (Signed)
Lab Results  Component Value Date   HGBA1C 8.2 (H) 11/28/2020  Uncontrolled.  She is currently following with endocrinology.  Advised her to call their office to let them know of most recent A1c.  She will continue Basaglar, Ozempic, metformin as prescribed by endocrine

## 2020-11-29 NOTE — Assessment & Plan Note (Signed)
LDL < 70.  Continue rosuvastatin 20 mg every other day

## 2020-11-29 NOTE — Patient Instructions (Signed)
Start doxycycline, antibiotic, for abscess upper back.  Warm compresses.  If area gets enlarged more painful, please go to urgent care or the weekend to have it drained.  You may use Voltaren gel for your right wrist pain.  Nice to see you!

## 2020-11-29 NOTE — Progress Notes (Signed)
Subjective:    Patient ID: Julia Brown, female    DOB: [birth year], 50 y.o.   MRN: [id number]  CC: Julia Brown is a 50 y.o. female who presents today for follow up.   HPI: Complains of cyst of upper back and she is concerned for infection x 2 weeks, unchanged.  Itchy. Not draining. Hasnt tried any medications for warm compresses.  She has h/o sebaceous cyst.   Complains of right base of thumb swelling and pain,  x3 weeks, improved.   She has been wearing carpal tunnel wrist at work with some improvement in pain  No fever, redness, numbness.  No injury.  Left handed  No history of gout  Works as Surveyor, minerals Hypertension-compliant with amlodipine 5 mg, losartan-HCT 100 to 25 mg, coreg 3.125mg  bid. No cp.   Hyperlipidemia-compliant with rosuvastatin 20 mg every other day  Diabetes type 2-Follows with endocrine; compliant with basaglar,  Ozempic, metformin 2000mg /day  A1c 8.2   No  h/o CKD  HISTORY:  Past Medical History:  Diagnosis Date   Chicken pox    Diabetes mellitus    dx 21, dx 06/19/20   GERD (gastroesophageal reflux disease)    Hyperlipidemia    Hypertension    UTI (lower urinary tract infection)    Past Surgical History:  Procedure Laterality Date   ABLATION  2007   excessive bleeding   CESAREAN SECTION  1994   CHOLECYSTECTOMY  1993   COLONOSCOPY WITH PROPOFOL N/A 07/25/2020   Procedure: COLONOSCOPY WITH PROPOFOL;  Surgeon: 09/24/2020, MD;  Location: [facility] ENDOSCOPY;  Service: Gastroenterology;  Laterality: N/A;   ESOPHAGOGASTRODUODENOSCOPY N/A 06/22/2020   Procedure: ESOPHAGOGASTRODUODENOSCOPY (EGD);  Surgeon: Toledo, 08/22/2020, MD;  Location: [facility] ENDOSCOPY;  Service: Gastroenterology;  Laterality: N/A;   ORIF FEMUR FRACTURE Right    [place]   TUBAL LIGATION     uterine ablation     Family History  Problem Relation Age of Onset   Heart disease Father    Hyperlipidemia Father    Diabetes Father    Hyperlipidemia Mother     Hypertension Mother    Schizophrenia Mother    Depression Mother    Diabetes Mother    Heart disease Other    Cancer Maternal Aunt        breast   Diabetes Maternal Grandmother    Diabetes Maternal Grandfather    Thyroid cancer Neg Hx     Allergies: Contrast media [iodinated diagnostic agents], Statins, Jardiance [empagliflozin], Bactrim [sulfamethoxazole-trimethoprim], Penicillins, and Trulicity [dulaglutide] Current Outpatient Medications on File Prior to Visit  Medication Sig Dispense Refill   amLODipine (NORVASC) 5 MG tablet Take 1 tablet (5 mg total) by mouth daily. 30 tablet 5   carvedilol (COREG) 3.125 MG tablet Take 1 tablet (3.125 mg total) by mouth 2 (two) times a day with meals. 180 tablet 6   Continuous Blood Gluc Receiver (FREESTYLE LIBRE 2 READER) DEVI 1 Device by Does not apply route every 14 (fourteen) days. 2 each 0   Continuous Blood Gluc Sensor (FREESTYLE LIBRE 2 SENSOR) MISC Place once sensor every 14 days. 2 each 2   Insulin Glargine (BASAGLAR KWIKPEN) 100 UNIT/ML 40 units daily with food increase every 3 days by 3 units for goal am sugar 90 to <130 am. Stop rybelsus (Patient taking differently: 50 Units. 40 units daily with food increase every 3 days by 3 units for goal am sugar 90 to <130 am. Stop rybelsus) 300 mL 2  losartan-hydrochlorothiazide (HYZAAR) 100-25 MG tablet Take 1 tablet by mouth daily.     metFORMIN (GLUCOPHAGE-XR) 500 MG 24 hr tablet Take 2 tablets (1,000 mg total) by mouth 2 (two) times daily with a meal. 360 tablet 1   Multiple Vitamins-Minerals (MULTIVITAMIN WITH MINERALS) tablet Take 1 tablet by mouth daily.     pantoprazole (PROTONIX) 40 MG tablet Take 1 tablet (40 mg total) by mouth daily. 90 tablet 1   rosuvastatin (CRESTOR) 20 MG tablet Take 1 tablet (20 mg total) by mouth every other day. 45 tablet 3   Semaglutide,0.25 or 0.5MG /DOS, (OZEMPIC, 0.25 OR 0.5 MG/DOSE,) 2 MG/1.5ML SOPN Inject 0.5 mg into the skin once a week.     metoCLOPramide  (REGLAN) 10 MG tablet Take 1 tablet (10 mg total) by mouth every 8 (eight) hours as needed for up to 4 days for nausea or vomiting. 12 tablet 0   No current facility-administered medications on file prior to visit.    Social History   Tobacco Use   Smoking status: Former    Packs/day: 0.50    Years: 10.00    Pack years: 5.00    Types: Cigarettes    Quit date: 06/25/2020    Years since quitting: 0.4   Smokeless tobacco: Never   Tobacco comments:    smoked < 1 pack a day  Vaping Use   Vaping Use: Never used  Substance Use Topics   Alcohol use: Yes    Alcohol/week: 0.0 standard drinks    Comment: weekend 3-4drinks   Drug use: No    Review of Systems  Constitutional:  Negative for chills and fever.  Respiratory:  Negative for cough.   Cardiovascular:  Negative for chest pain and palpitations.  Gastrointestinal:  Negative for nausea and vomiting.  Musculoskeletal:  Positive for arthralgias.  Skin:  Positive for rash.  Neurological:  Negative for numbness.     Objective:    BP 130/86   Pulse 93   Ht 5\' 10"  (1.778 m)   Wt 241 lb 6.4 oz (109.5 kg)   SpO2 98%   BMI 34.64 kg/m  BP Readings from Last 3 Encounters:  11/29/20 130/86  11/28/20 114/60  10/18/20 126/82   Wt Readings from Last 3 Encounters:  11/29/20 241 lb 6.4 oz (109.5 kg)  11/28/20 240 lb (108.9 kg)  10/18/20 238 lb 9.6 oz (108.2 kg)    Physical Exam Vitals reviewed.  Constitutional:      Appearance: She is well-developed.  Eyes:     Conjunctiva/sclera: Conjunctivae normal.  Cardiovascular:     Rate and Rhythm: Normal rate and regular rhythm.     Pulses: Normal pulses.     Heart sounds: Normal heart sounds.  Pulmonary:     Effort: Pulmonary effort is normal.     Breath sounds: Normal breath sounds. No wheezing, rhonchi or rales.  Musculoskeletal:     Right wrist: No swelling. Normal range of motion.     Left wrist: No swelling. Normal range of motion.     Right hand: Tenderness present. Normal  range of motion. Normal strength. Normal sensation.     Left hand: No tenderness. Normal range of motion. Normal strength. Normal sensation.     Comments: Tenderness over right carpometacarpal joint.  No effusion, erythema, rash, increased warmth.  Skin:    General: Skin is warm and dry.          Comments: 3 to 4 cm abscess noted mid upper back.  Nondraining.  Nontender.  Nonfluctuant and no red streaks  Neurological:     Mental Status: She is alert.  Psychiatric:        Speech: Speech normal.        Behavior: Behavior normal.        Thought Content: Thought content normal.       Assessment & Plan:   Problem List Items Addressed This Visit       Cardiovascular and Mediastinum   Essential hypertension (Chronic)    Overall controlled, continue amlodipine 5 mg, losartan-HCT 100 to 25 mg, coreg 3.125mg  bid        Endocrine   DM (diabetes mellitus) (HCC)    Lab Results  Component Value Date   HGBA1C 8.2 (H) 11/28/2020  Uncontrolled.  She is currently following with endocrinology.  Advised her to call their office to let them know of most recent A1c.  She will continue Basaglar, Ozempic, metformin as prescribed by endocrine         Other   Hyperlipidemia (Chronic)    LDL < 70.  Continue rosuvastatin 20 mg every other day      Abscess    In the absence of fluctuance, advised patient that I would not perform incision and drainage as unlikely would yield any purulence from area.  Presentation is consistent with bacterial abscess and I have started her on doxycycline.  Advised her to take probiotics.  Over the weekend if abscess were to enlarge and had advised her to return for reevaluation at [facility] as she may require incision and drainage at that point.      Relevant Medications   doxycycline (VIBRA-TABS) 100 MG tablet   Right hand pain - Primary    Symptoms most consistent with CMP joint osteoarthritis.  Advised Voltaren gel.  Pending inflammatory labs, x-ray.  Discussed  consult with Dr. Stephenie Acres at [facility] if symptoms persist.      Relevant Orders   ANA   C-reactive protein   Rheumatoid factor   Sedimentation rate   Uric acid   DG Hand Complete Right   Cyclic citrul peptide antibody, IgG (QUEST)     I am having [name] start on doxycycline. I am also having her maintain her multivitamin with minerals, metoCLOPramide, FreeStyle Libre 2 Sensor, metFORMIN, amLODipine, rosuvastatin, pantoprazole, FreeStyle Libre 2 Reader, [employer], carvedilol, Ozempic (0.25 or 0.5 MG/DOSE), and losartan-hydrochlorothiazide.   Meds ordered this encounter  Medications   doxycycline (VIBRA-TABS) 100 MG tablet    Sig: Take 1 tablet (100 mg total) by mouth 2 (two) times daily.    Dispense:  10 tablet    Refill:  0    Order Specific Question:   Supervising Provider    Answer:   Sherlene Shams [[id number]]     Return precautions given.   Risks, benefits, and alternatives of the medications and treatment plan prescribed today were discussed, and patient expressed understanding.   Education regarding symptom management and diagnosis given to patient on AVS.  Continue to follow with Allegra Grana, FNP for routine health maintenance.   [name] and I agreed with plan.   Rennie Plowman, FNP

## 2020-11-29 NOTE — Assessment & Plan Note (Signed)
Overall controlled, continue amlodipine 5 mg, losartan-HCT 100 to 25 mg, coreg 3.125mg  bid

## 2020-12-02 LAB — CYCLIC CITRUL PEPTIDE ANTIBODY, IGG: Cyclic Citrullin Peptide Ab: <16 UNITS

## 2020-12-02 LAB — URIC ACID: Uric Acid, Serum: 4.9 mg/dL (ref 2.5–7.0)

## 2020-12-02 LAB — ANA: Anti Nuclear Antibody (ANA): NEGATIVE

## 2020-12-02 LAB — C-REACTIVE PROTEIN: CRP: 3.6 mg/L (ref <8.0)

## 2020-12-02 LAB — SEDIMENTATION RATE: Sed Rate: 17 mm/h (ref 0–20)

## 2020-12-02 LAB — RHEUMATOID FACTOR: Rheumatoid fact SerPl-aCnc: <14 IU/mL (ref <14)

## 2020-12-22 ENCOUNTER — Other Ambulatory Visit: Payer: Self-pay | Admitting: Family

## 2020-12-28 ENCOUNTER — Other Ambulatory Visit: Payer: Self-pay | Admitting: Internal Medicine

## 2021-01-05 ENCOUNTER — Other Ambulatory Visit: Payer: Self-pay | Admitting: Family

## 2021-03-07 ENCOUNTER — Ambulatory Visit: Payer: BC Managed Care – PPO | Admitting: Family

## 2021-03-26 ENCOUNTER — Ambulatory Visit: Payer: BC Managed Care – PPO | Admitting: Family

## 2021-04-04 ENCOUNTER — Ambulatory Visit: Payer: BC Managed Care – PPO | Admitting: Family

## 2021-05-09 ENCOUNTER — Telehealth: Payer: Self-pay

## 2021-05-09 NOTE — Telephone Encounter (Signed)
A1c and cmp are fine

## 2021-05-09 NOTE — Telephone Encounter (Signed)
I called patient & she is scheduled for follow-up. She asked could labs be done before she comes for follow-up 3/3. I know a1c needs to be ordered.

## 2021-05-12 ENCOUNTER — Other Ambulatory Visit: Payer: Self-pay

## 2021-05-12 DIAGNOSIS — I152 Hypertension secondary to endocrine disorders: Secondary | ICD-10-CM

## 2021-05-12 DIAGNOSIS — E1110 Type 2 diabetes mellitus with ketoacidosis without coma: Secondary | ICD-10-CM

## 2021-05-12 DIAGNOSIS — R5383 Other fatigue: Secondary | ICD-10-CM

## 2021-05-12 DIAGNOSIS — E559 Vitamin D deficiency, unspecified: Secondary | ICD-10-CM

## 2021-05-12 DIAGNOSIS — E1159 Type 2 diabetes mellitus with other circulatory complications: Secondary | ICD-10-CM

## 2021-05-12 NOTE — Telephone Encounter (Signed)
Patient really wanted Lipid drawn. I have ordered is that okay?

## 2021-05-12 NOTE — Telephone Encounter (Signed)
Thanks sarah Yes lipid okay

## 2021-05-22 ENCOUNTER — Other Ambulatory Visit: Payer: BC Managed Care – PPO

## 2021-05-23 ENCOUNTER — Ambulatory Visit: Payer: BC Managed Care – PPO | Admitting: Family

## 2021-05-23 ENCOUNTER — Encounter: Payer: Self-pay | Admitting: Family

## 2021-05-23 ENCOUNTER — Other Ambulatory Visit: Payer: Self-pay

## 2021-05-23 DIAGNOSIS — R5383 Other fatigue: Secondary | ICD-10-CM | POA: Diagnosis not present

## 2021-05-23 DIAGNOSIS — E785 Hyperlipidemia, unspecified: Secondary | ICD-10-CM

## 2021-05-23 DIAGNOSIS — E559 Vitamin D deficiency, unspecified: Secondary | ICD-10-CM | POA: Diagnosis not present

## 2021-05-23 DIAGNOSIS — E1110 Type 2 diabetes mellitus with ketoacidosis without coma: Secondary | ICD-10-CM

## 2021-05-23 DIAGNOSIS — E1159 Type 2 diabetes mellitus with other circulatory complications: Secondary | ICD-10-CM

## 2021-05-23 DIAGNOSIS — I152 Hypertension secondary to endocrine disorders: Secondary | ICD-10-CM

## 2021-05-23 DIAGNOSIS — I10 Essential (primary) hypertension: Secondary | ICD-10-CM

## 2021-05-23 NOTE — Progress Notes (Signed)
? ?Subjective:  ? ? Patient ID: Julia Brown, female    DOB: [birth year], 51 y.o.   MRN: [id number] ? ?CC: Julia Brown is a 51 y.o. female who presents today for follow up.  ? ?HPI: Complains of fatigue for couple of months, comes and goes ?Decreased motivation and no energy. Energy improves with activity.  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  ?Feels restored from sleep. She gets 8-9 hours sleep. She snores. No HA, fever, unusual bone pain.  ?No formal exercise outside of work. ? ? ?HTN- compliant with amlodipine 5 mg, losartan-HCT 100 to 25 mg, coreg 3.125mg  bid. No cp.  ?HLD-compliant with atorvastatin 20 mg every other day ?DM-continues to follow with endocrinology, Dr Ladell Pier.  She is compliant with  Basaglar, Ozempic, metformin  ?HISTORY:  ?Past Medical History:  ?Diagnosis Date  ? Chicken pox   ? Diabetes mellitus   ? dx 23, dx 06/19/20  ? GERD (gastroesophageal reflux disease)   ? Hyperlipidemia   ? Hypertension   ? UTI (lower urinary tract infection)   ? ?Past Surgical History:  ?Procedure Laterality Date  ? ABLATION  2007  ? excessive bleeding  ? [place]  ? CHOLECYSTECTOMY  1993  ? COLONOSCOPY WITH PROPOFOL N/A 07/25/2020  ? Procedure: COLONOSCOPY WITH PROPOFOL;  Surgeon: Jonathon Bellows, MD;  Location: [facility] ENDOSCOPY;  Service: Gastroenterology;  Laterality: N/A;  ? ESOPHAGOGASTRODUODENOSCOPY N/A 06/22/2020  ? Procedure: ESOPHAGOGASTRODUODENOSCOPY (EGD);  Surgeon: Toledo, Benay Pike, MD;  Location: [facility] ENDOSCOPY;  Service: Gastroenterology;  Laterality: N/A;  ? ORIF FEMUR FRACTURE Right   ? [place]  ? TUBAL LIGATION    ? uterine ablation    ? ?Family History  ?Problem Relation Age of Onset  ? Heart disease Father   ? Hyperlipidemia Father   ? Diabetes Father   ? Hyperlipidemia Mother   ? Hypertension Mother   ? Schizophrenia Mother   ? Depression Mother   ? Diabetes Mother   ? Heart disease Other   ? Cancer Maternal Aunt   ?     breast  ? Diabetes Maternal Grandmother    ? Diabetes Maternal Grandfather   ? Thyroid cancer Neg Hx   ? ? ?Allergies: Contrast media [iodinated contrast media], Statins, Jardiance [empagliflozin], Bactrim [sulfamethoxazole-trimethoprim], Penicillins, and Trulicity [dulaglutide] ?Current Outpatient Medications on File Prior to Visit  ?Medication Sig Dispense Refill  ? amLODipine (NORVASC) 5 MG tablet Take 1 tablet (5 mg total) by mouth daily. 30 tablet 5  ? carvedilol (COREG) 3.125 MG tablet Take 1 tablet (3.125 mg total) by mouth 2 (two) times a day with meals. 180 tablet 6  ? Continuous Blood Gluc Receiver (FREESTYLE LIBRE 2 READER) DEVI 1 Device by Does not apply route every 14 (fourteen) days. 2 each 0  ? Continuous Blood Gluc Sensor (FREESTYLE LIBRE 2 SENSOR) MISC Place once sensor every 14 days. 2 each 2  ? Insulin Glargine (BASAGLAR KWIKPEN) 100 UNIT/ML 40 units daily with food increase every 3 days by 3 units for goal am sugar 90 to <130 am. Stop rybelsus (Patient taking differently: 50 Units. 40 units daily with food increase every 3 days by 3 units for goal am sugar 90 to <130 am. Stop rybelsus) 300 mL 2  ? losartan-hydrochlorothiazide (HYZAAR) 100-25 MG tablet Take 1 tablet by mouth daily.    ? metFORMIN (GLUCOPHAGE-XR) 500 MG 24 hr tablet Take 2 tablets (1,000 mg total) by mouth daily with breakfast. 180  tablet 1  ? Multiple Vitamins-Minerals (MULTIVITAMIN WITH MINERALS) tablet Take 1 tablet by mouth daily.    ? pantoprazole (PROTONIX) 40 MG tablet Take 1 tablet (40 mg total) by mouth daily. 90 tablet 1  ? rosuvastatin (CRESTOR) 20 MG tablet Take 1 tablet (20 mg total) by mouth every other day. 45 tablet 3  ? Semaglutide,0.25 or 0.5MG /DOS, (OZEMPIC, 0.25 OR 0.5 MG/DOSE,) 2 MG/1.5ML SOPN Inject 0.5 mg into the skin once a week.    ? ?No current facility-administered medications on file prior to visit.  ? ? ?Social History  ? ?Tobacco Use  ? Smoking status: Former  ?  Packs/day: 0.50  ?  Years: 10.00  ?  Pack years: 5.00  ?  Types: Cigarettes  ?   Quit date: 06/25/2020  ?  Years since quitting: 0.9  ? Smokeless tobacco: Never  ? Tobacco comments:  ?  smoked < 1 pack a day  ?Vaping Use  ? Vaping Use: Never used  ?Substance Use Topics  ? Alcohol use: Yes  ?  Alcohol/week: 0.0 standard drinks  ?  Comment: weekend 3-4drinks  ? Drug use: No  ? ? ?Review of Systems  ?Constitutional:  Positive for fatigue. Negative for chills and fever.  ?Respiratory:  Negative for cough.   ?Cardiovascular:  Negative for chest pain and palpitations.  ?Gastrointestinal:  Negative for nausea and vomiting.  ?   ?Objective:  ?  ?BP 118/76   Temp 98.5 ?F (36.9 ?C) (Oral)   Ht 5\' 10"  (1.778 m)   Wt 246 lb (111.6 kg)   BMI 35.30 kg/m?  ?BP Readings from Last 3 Encounters:  ?05/23/21 118/76  ?11/29/20 130/86  ?11/28/20 114/60  ? ?Wt Readings from Last 3 Encounters:  ?05/23/21 246 lb (111.6 kg)  ?11/29/20 241 lb 6.4 oz (109.5 kg)  ?11/28/20 240 lb (108.9 kg)  ? ? ?Physical Exam ?Vitals reviewed.  ?Constitutional:   ?   Appearance: She is well-developed.  ?Eyes:  ?   Conjunctiva/sclera: Conjunctivae normal.  ?Cardiovascular:  ?   Rate and Rhythm: Normal rate and regular rhythm.  ?   Pulses: Normal pulses.  ?   Heart sounds: Normal heart sounds.  ?Pulmonary:  ?   Effort: Pulmonary effort is normal.  ?   Breath sounds: Normal breath sounds. No wheezing, rhonchi or rales.  ?Skin: ?   General: Skin is warm and dry.  ?Neurological:  ?   Mental Status: She is alert.  ?Psychiatric:     ?   Speech: Speech normal.     ?   Behavior: Behavior normal.     ?   Thought Content: Thought content normal.  ? ? ?   ?Assessment & Plan:  ? ?Problem List Items Addressed This Visit   ? ?  ? Cardiovascular and Mediastinum  ? Essential hypertension (Chronic)  ?  Chronic, stable.  Continue amlodipine 5 mg, losartan-HCT 100 to 25 mg, coreg 3.125mg  bid ?  ?  ? Relevant Orders  ? B12 and Folate Panel (Completed)  ? Lipid panel (Completed)  ? Hypertension associated with diabetes ([place])  ?  ? Endocrine  ? DM (diabetes  mellitus) ([place])  ?  Lab Results  ?Component Value Date  ? HGBA1C 8.5 (H) 05/23/2021  ?Uncontrolled.  Continues to follow with endocrinology who prescribes Basaglar, Ozempic and metformin.  Will follow ?  ?  ? Relevant Orders  ? Comprehensive metabolic panel (Completed)  ? Hemoglobin A1c (Completed)  ?  ? Other  ?  Hyperlipidemia (Chronic)  ?  LDL greater than 70.  As per result note, advised patient to consider increasing atorvastatin to 20 mg daily.  Awaiting on patient response ?  ?  ? Fatigue  ?  Fatigue has continued for the past couple months.  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  She does snore.  I placed a referral to pulmonology for evaluation of sleep apnea.  Mammogram has been ordered and patient would like to schedule herself.  Pending thorough lab evaluation to look for metabolic reason for fatigue as well.  ?  ?  ? Relevant Orders  ? Ambulatory referral to Pulmonology  ? CBC with Differential/Platelet (Completed)  ? Vitamin D deficiency  ? Relevant Orders  ? Vitamin D (25 hydroxy) (Completed)  ? ? ? ?I have discontinued [employer]cker's metoCLOPramide and doxycycline. I am also having her maintain her multivitamin with minerals, FreeStyle Libre 2 Sensor, rosuvastatin, FreeStyle Libre 2 Reader, [employer], carvedilol, Ozempic (0.25 or 0.5 MG/DOSE), losartan-hydrochlorothiazide, amLODipine, metFORMIN, and pantoprazole. ? ? ?No orders of the defined types were placed in this encounter. ? ? ?Return precautions given.  ? ?Risks, benefits, and alternatives of the medications and treatment plan prescribed today were discussed, and patient expressed understanding.  ? ?Education regarding symptom management and diagnosis given to patient on AVS. ? ?Continue to follow with Burnard Hawthorne, FNP for routine health maintenance.  ? ?Schuyler Amor and I agreed with plan.  ? ?[name], FNP ? ? ?

## 2021-05-23 NOTE — Patient Instructions (Addendum)
Referral to pulmonology ?Let us know if you dont hear back within a week in regards to an appointment being scheduled.  ? ?Please call  and schedule your 3D mammogram and /or bone density scan as we discussed. ? ? [facility]  ?[address]  ?[place], [place]  ?[phone] ? ? ? ?

## 2021-05-23 NOTE — Addendum Note (Signed)
Addended by: Marijo Sanes on: 05/23/2021 04:18 PM   Modules accepted: Orders

## 2021-05-24 LAB — B12 AND FOLATE PANEL
Folate: 13.2 ng/mL
Vitamin B-12: 389 pg/mL (ref 200–1100)

## 2021-05-24 LAB — CBC WITH DIFFERENTIAL/PLATELET
Absolute Monocytes: 813 cells/uL (ref 200–950)
Basophils Absolute: 75 cells/uL (ref 0–200)
Basophils Relative: 0.7 %
Eosinophils Absolute: 118 cells/uL (ref 15–500)
Eosinophils Relative: 1.1 %
HCT: 33.1 % — ABNORMAL LOW (ref 35.0–45.0)
Hemoglobin: 11 g/dL — ABNORMAL LOW (ref 11.7–15.5)
Lymphs Abs: 2525 cells/uL (ref 850–3900)
MCH: 29.6 pg (ref 27.0–33.0)
MCHC: 33.2 g/dL (ref 32.0–36.0)
MCV: 89 fL (ref 80.0–100.0)
MPV: 9.7 fL (ref 7.5–12.5)
Monocytes Relative: 7.6 %
Neutro Abs: 7169 cells/uL (ref 1500–7800)
Neutrophils Relative %: 67 %
Platelets: 283 10*3/uL (ref 140–400)
RBC: 3.72 10*6/uL — ABNORMAL LOW (ref 3.80–5.10)
RDW: 12.5 % (ref 11.0–15.0)
Total Lymphocyte: 23.6 %
WBC: 10.7 10*3/uL (ref 3.8–10.8)

## 2021-05-24 LAB — COMPREHENSIVE METABOLIC PANEL
AG Ratio: 1.9 (calc) (ref 1.0–2.5)
ALT: 18 U/L (ref 6–29)
AST: 14 U/L (ref 10–35)
Albumin: 4.3 g/dL (ref 3.6–5.1)
Alkaline phosphatase (APISO): 56 U/L (ref 31–125)
BUN: 17 mg/dL (ref 7–25)
CO2: 23 mmol/L (ref 20–32)
Calcium: 9.9 mg/dL (ref 8.6–10.2)
Chloride: 102 mmol/L (ref 98–110)
Creat: 0.91 mg/dL (ref 0.50–0.99)
Globulin: 2.3 g/dL (calc) (ref 1.9–3.7)
Glucose, Bld: 207 mg/dL — ABNORMAL HIGH (ref 65–99)
Potassium: 4.3 mmol/L (ref 3.5–5.3)
Sodium: 138 mmol/L (ref 135–146)
Total Bilirubin: 0.3 mg/dL (ref 0.2–1.2)
Total Protein: 6.6 g/dL (ref 6.1–8.1)

## 2021-05-24 LAB — HEMOGLOBIN A1C
Hgb A1c MFr Bld: 8.5 % of total Hgb — ABNORMAL HIGH (ref <5.7)
Mean Plasma Glucose: 197 mg/dL
eAG (mmol/L): 10.9 mmol/L

## 2021-05-24 LAB — VITAMIN D 25 HYDROXY (VIT D DEFICIENCY, FRACTURES): Vit D, 25-Hydroxy: 36 ng/mL (ref 30–100)

## 2021-05-24 LAB — LIPID PANEL
Cholesterol: 187 mg/dL (ref <200)
HDL: 53 mg/dL (ref >=50)
LDL Cholesterol (Calc): 94 mg/dL (calc)
Non-HDL Cholesterol (Calc): 134 mg/dL (calc) — ABNORMAL HIGH (ref <130)
Total CHOL/HDL Ratio: 3.5 (calc) (ref <5.0)
Triglycerides: 297 mg/dL — ABNORMAL HIGH (ref <150)

## 2021-05-26 ENCOUNTER — Other Ambulatory Visit: Payer: Self-pay | Admitting: Family

## 2021-05-26 DIAGNOSIS — D649 Anemia, unspecified: Secondary | ICD-10-CM

## 2021-05-26 NOTE — Assessment & Plan Note (Signed)
Chronic, stable.  Continue amlodipine 5 mg, losartan-HCT 100 to 25 mg, coreg 3.125mg  bid ?

## 2021-05-26 NOTE — Assessment & Plan Note (Addendum)
Fatigue has continued for the past couple months.  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  She does snore.  I placed a referral to pulmonology for evaluation of sleep apnea.  Mammogram has been ordered and patient would like to schedule herself.  Pending thorough lab evaluation to look for metabolic reason for fatigue as well.  ?

## 2021-05-26 NOTE — Assessment & Plan Note (Signed)
Lab Results  ?Component Value Date  ? HGBA1C 8.5 (H) 05/23/2021  ? ?Uncontrolled.  Continues to follow with endocrinology who prescribes Basaglar, Ozempic and metformin.  Will follow ?

## 2021-05-26 NOTE — Assessment & Plan Note (Signed)
LDL greater than 70.  As per result note, advised patient to consider increasing atorvastatin to 20 mg daily.  Awaiting on patient response ?

## 2021-05-27 ENCOUNTER — Encounter: Payer: Self-pay | Admitting: Family

## 2021-05-28 ENCOUNTER — Other Ambulatory Visit: Payer: Self-pay

## 2021-05-28 ENCOUNTER — Other Ambulatory Visit (INDEPENDENT_AMBULATORY_CARE_PROVIDER_SITE_OTHER): Payer: BC Managed Care – PPO

## 2021-05-28 DIAGNOSIS — D649 Anemia, unspecified: Secondary | ICD-10-CM | POA: Diagnosis not present

## 2021-05-29 LAB — IBC + FERRITIN
Ferritin: 213.3 ng/mL (ref 10.0–291.0)
Iron: 31 ug/dL — ABNORMAL LOW (ref 42–145)
Saturation Ratios: 8.6 % — ABNORMAL LOW (ref 20.0–50.0)
TIBC: 358.4 ug/dL (ref 250.0–450.0)
Transferrin: 256 mg/dL (ref 212.0–360.0)

## 2021-05-30 ENCOUNTER — Encounter: Payer: Self-pay | Admitting: Family

## 2021-05-30 ENCOUNTER — Other Ambulatory Visit: Payer: Self-pay | Admitting: Family

## 2021-05-30 DIAGNOSIS — D649 Anemia, unspecified: Secondary | ICD-10-CM

## 2021-06-10 ENCOUNTER — Other Ambulatory Visit: Payer: Self-pay | Admitting: Family

## 2021-06-10 DIAGNOSIS — Z1231 Encounter for screening mammogram for malignant neoplasm of breast: Secondary | ICD-10-CM

## 2021-07-03 ENCOUNTER — Other Ambulatory Visit: Payer: Self-pay | Admitting: Family

## 2021-07-11 ENCOUNTER — Telehealth: Payer: Self-pay

## 2021-07-11 NOTE — Telephone Encounter (Signed)
Lm for patient to ask if she has had previous sleep study.  

## 2021-07-14 ENCOUNTER — Other Ambulatory Visit: Payer: Self-pay | Admitting: Family

## 2021-07-14 ENCOUNTER — Encounter: Payer: Self-pay | Admitting: Family

## 2021-07-14 DIAGNOSIS — T753XXA Motion sickness, initial encounter: Secondary | ICD-10-CM

## 2021-07-14 MED ORDER — SCOPOLAMINE 1 MG/3DAYS TD PT72: 1.0000 | MEDICATED_PATCH | TRANSDERMAL | 0 refills | Status: DC

## 2021-07-14 NOTE — Telephone Encounter (Signed)
Lm x2 for patient.  Will close encounter per office protocol.   

## 2021-07-15 ENCOUNTER — Encounter: Payer: Self-pay | Admitting: Adult Health

## 2021-07-15 ENCOUNTER — Ambulatory Visit (INDEPENDENT_AMBULATORY_CARE_PROVIDER_SITE_OTHER): Payer: BC Managed Care – PPO | Admitting: Adult Health

## 2021-07-15 DIAGNOSIS — Z720 Tobacco use: Secondary | ICD-10-CM | POA: Diagnosis not present

## 2021-07-15 DIAGNOSIS — R0683 Snoring: Secondary | ICD-10-CM

## 2021-07-15 DIAGNOSIS — E669 Obesity, unspecified: Secondary | ICD-10-CM | POA: Diagnosis not present

## 2021-07-15 NOTE — Assessment & Plan Note (Signed)
Work on quitting smoking

## 2021-07-15 NOTE — Assessment & Plan Note (Signed)
Snoring, daytime sleepiness, restless sleep, witnessed apneic events, BMI 35 all suspicious for underlying sleep apnea.  Patient education given on sleep apnea.  Patient will be set up for home sleep study.  Healthy sleep regimen was discussed ? ?- discussed how weight can impact sleep and risk for sleep disordered breathing ?- discussed options to assist with weight loss: combination of diet modification, cardiovascular and strength training exercises ?  ?- had an extensive discussion regarding the adverse health consequences related to untreated sleep disordered breathing ?- specifically discussed the risks for hypertension, coronary artery disease, cardiac dysrhythmias, cerebrovascular disease, and diabetes ?- lifestyle modification discussed ?  ?- discussed how sleep disruption can increase risk of accidents, particularly when driving ?- safe driving practices were discussed ?  ?Plan  ?Patient Instructions  ?Set up for home sleep study ?Healthy sleep regimen ?Work on healthy weight loss ?Follow-up in 6 to 8 weeks to discuss results and treatment plan ?  ? ?

## 2021-07-15 NOTE — Patient Instructions (Signed)
Set up for home sleep study ?Healthy sleep regimen ?Work on healthy weight loss ?Follow-up in 6 to 8 weeks to discuss results and treatment plan ?

## 2021-07-15 NOTE — Progress Notes (Signed)
? ?@Patient  ID: Julia CrumbMonique T Fitton, female    DOB: 01/26/1971, 51 y.o.   MRN: [id number] ? ?Chief Complaint  ?Patient presents with  ? sleep consult  ? ? ?Referring provider: ?[name], [name], FNP ? ?HPI: ?51 year old female seen for sleep consult July 15, 2021 with daytime sleepiness and snoring ? ?TEST/EVENTS :  ? ?.07/15/2021 Sleep consult  ?Patient presents for a sleep consult.  Kindly referred by her primary care provider Rennie PlowmanMargaret Arnett, NP.  Patient complains of snoring, daytime sleepiness and restless sleep.  Also has some witnessed apneic events by her spouse.  Typically goes to bed about 10 PM takes about 30 minutes to go to sleep.  Is up about 2 times each night.  Is out of bed at about 5:15 AM.  No previous sleep study.  Weight has been stable.  No symptoms suspicious for cataplexy or sleep paralysis.  Caffeine intake 4-5 cups tea/coffee.  ?Epworth score is 2 out of 24.  Mainly sleepy with inactivity.  Has loud snoring and witnessed apneic events per husband.  And intermittent restless sleep.  She does not take any sleep aids for insomnia ?No history of congestive heart failure or stroke ? ?Medical history significant for diabetes, hypertension, hepatic steatosis, hyperlipidemia ? ?Social history patient is married.  Is a Engineer, sitemedical assistant.  Has children.-4 adult kids  Smokes a half a pack a day.  On average drinks about 4 beers a week. No drug use.  ? ?Family history positive for cancer in grandmother and aunt.   ? ?Surgical history cholecystectomy 1993, tubal ligation 1997, Hip surgery  ? ? ? ?Allergies  ?Allergen Reactions  ? Contrast Media [Iodinated Contrast Media] Anaphylaxis  ?  Wheezing and rash immediately after CT w/.  ? Statins Palpitations  ? Jardiance [Empagliflozin]   ?  DKA hospitalized 05/2020  ? Bactrim [Sulfamethoxazole-Trimethoprim] Itching  ?  itching  ? Penicillins Rash  ? Trulicity [Dulaglutide] Nausea Only  ? ? ?Immunization History  ?Administered Date(s) Administered  ? 19-influenza  Whole 12/03/2017  ? Influenza Split 01/13/2012, 01/02/2014  ? Influenza,inj,Quad PF,6+ Mos 12/12/2020  ? Influenza-Unspecified 12/31/2014, 01/04/2016, 12/30/2016, 12/12/2019  ? PFIZER(Purple Top)SARS-COV-2 Vaccination 04/03/2019, 04/25/2019, 02/27/2020  ? Pneumococcal Polysaccharide-23 04/23/2009  ? Tdap 01/21/2009, 08/21/2017  ? ? ?Past Medical History:  ?Diagnosis Date  ? Chicken pox   ? Diabetes mellitus   ? dx 23, dx 06/19/20  ? GERD (gastroesophageal reflux disease)   ? Hyperlipidemia   ? Hypertension   ? UTI (lower urinary tract infection)   ? ? ?Tobacco History: ?Social History  ? ?Tobacco Use  ?Smoking Status Former  ? Packs/day: 0.75  ? Years: 15.00  ? Pack years: 11.25  ? Types: Cigarettes  ? Quit date: 06/25/2020  ? Years since quitting: 1.0  ?Smokeless Tobacco Never  ?Tobacco Comments  ? 0.5PPD-07/15/2021  ? ?Counseling given: Not Answered ?Tobacco comments: 0.5PPD-07/15/2021 ? ? ?Outpatient Medications Prior to Visit  ?Medication Sig Dispense Refill  ? amLODipine (NORVASC) 5 MG tablet Take 1 tablet (5 mg total) by mouth daily. 30 tablet 5  ? carvedilol (COREG) 3.125 MG tablet Take 1 tablet (3.125 mg total) by mouth 2 (two) times a day with meals. 180 tablet 6  ? Continuous Blood Gluc Receiver (FREESTYLE LIBRE 2 READER) DEVI 1 Device by Does not apply route every 14 (fourteen) days. 2 each 0  ? Continuous Blood Gluc Sensor (FREESTYLE LIBRE 2 SENSOR) MISC Place once sensor every 14 days. 2 each 2  ? ferrous sulfate  325 (65 FE) MG tablet Take 325 mg by mouth 2 (two) times daily with a meal.    ? Insulin Glargine (BASAGLAR KWIKPEN) 100 UNIT/ML 40 units daily with food increase every 3 days by 3 units for goal am sugar 90 to <130 am. Stop rybelsus (Patient taking differently: 50 Units. 40 units daily with food increase every 3 days by 3 units for goal am sugar 90 to <130 am. Stop rybelsus) 300 mL 2  ? losartan-hydrochlorothiazide (HYZAAR) 100-25 MG tablet Take 1 tablet by mouth daily.    ? metFORMIN  (GLUCOPHAGE-XR) 500 MG 24 hr tablet Take 2 tablets (1,000 mg total) by mouth daily with breakfast. 180 tablet 1  ? Multiple Vitamins-Minerals (MULTIVITAMIN WITH MINERALS) tablet Take 1 tablet by mouth daily.    ? pantoprazole (PROTONIX) 40 MG tablet Take 1 tablet (40 mg total) by mouth daily. 90 tablet 1  ? rosuvastatin (CRESTOR) 20 MG tablet Take 1 tablet (20 mg total) by mouth every other day. 45 tablet 3  ? scopolamine (TRANSDERM-SCOP) 1 MG/3DAYS Place 1 patch (1.5 mg total) onto the skin every 3 (three) days. 10 patch 0  ? Semaglutide,0.25 or 0.5MG /DOS, (OZEMPIC, 0.25 OR 0.5 MG/DOSE,) 2 MG/1.5ML SOPN Inject 0.5 mg into the skin once a week.    ? ?No facility-administered medications prior to visit.  ? ? ? ?Review of Systems:  ? ?Constitutional:   No  weight loss, night sweats,  Fevers, chills,  ?+fatigue, or  lassitude. ? ?HEENT:   No headaches,  Difficulty swallowing,  Tooth/dental problems, or  Sore throat,  ?              No sneezing, itching, ear ache, nasal congestion, post nasal drip,  ? ?CV:  No chest pain,  Orthopnea, PND, swelling in lower extremities, anasarca, dizziness, palpitations, syncope.  ? ?GI  No heartburn, indigestion, abdominal pain, nausea, vomiting, diarrhea, change in bowel habits, loss of appetite, bloody stools.  ? ?Resp: No shortness of breath with exertion or at rest.  No excess mucus, no productive cough,  No non-productive cough,  No coughing up of blood.  No change in color of mucus.  No wheezing.  No chest wall deformity ? ?Skin: no rash or lesions. ? ?GU: no dysuria, change in color of urine, no urgency or frequency.  No flank pain, no hematuria  ? ?MS:  No joint pain or swelling.  No decreased range of motion.  No back pain. ? ? ? ?Physical Exam ? ?BP 126/68 (BP Location: Left Arm, Cuff Size: Large)   Pulse 94   Temp 97.7 ?F (36.5 ?C) (Temporal)   Ht 5\' 10"  (1.778 m)   Wt 244 lb 6.4 oz (110.9 kg)   SpO2 99%   BMI 35.07 kg/m?  ? ?GEN: A/Ox3; pleasant , NAD, well nourished   ?  ?HEENT:  [place]/AT,  EACs-clear, TMs-wnl, NOSE-clear, THROAT-clear, no lesions, no postnasal drip or exudate noted. Class 3 MP airway  ? ?NECK:  Supple w/ fair ROM; no JVD; normal carotid impulses w/o bruits; no thyromegaly or nodules palpated; no lymphadenopathy.   ? ?RESP  Clear  P & A; w/o, wheezes/ rales/ or rhonchi. no accessory muscle use, no dullness to percussion ? ?CARD:  RRR, no m/r/g, no peripheral edema, pulses intact, no cyanosis or clubbing. ? ?GI:   Soft & nt; nml bowel sounds; no organomegaly or masses detected.  ? ?Musco: Warm bil, no deformities or joint swelling noted.  ? ?Neuro: alert, no focal deficits  noted.   ? ?Skin: Warm, no lesions or rashes ? ? ? ?Lab Results: ? ? ? ?BNP ?No results found for: BNP ? ?ProBNP ?No results found for: PROBNP ? ?Imaging: ?No results found. ? ? ? ?   ? View : No data to display.  ?  ?  ?  ? ? ?No results found for: NITRICOXIDE ? ? ? ? ? ?Assessment & Plan:  ? ?Snoring ?Snoring, daytime sleepiness, restless sleep, witnessed apneic events, BMI 35 all suspicious for underlying sleep apnea.  Patient education given on sleep apnea.  Patient will be set up for home sleep study.  Healthy sleep regimen was discussed ? ?- discussed how weight can impact sleep and risk for sleep disordered breathing ?- discussed options to assist with weight loss: combination of diet modification, cardiovascular and strength training exercises ?  ?- had an extensive discussion regarding the adverse health consequences related to untreated sleep disordered breathing ?- specifically discussed the risks for hypertension, coronary artery disease, cardiac dysrhythmias, cerebrovascular disease, and diabetes ?- lifestyle modification discussed ?  ?- discussed how sleep disruption can increase risk of accidents, particularly when driving ?- safe driving practices were discussed ?  ?Plan  ?Patient Instructions  ?Set up for home sleep study ?Healthy sleep regimen ?Work on healthy weight  loss ?Follow-up in 6 to 8 weeks to discuss results and treatment plan ?  ? ? ?Obesity (BMI 30-39.9) ?Healthy weight loss discussed  ? ?Tobacco abuse ?Work on quitting smoking .  ? ? ? ? ?Rubye Oaks, NP ?07/15/2021 ? ?

## 2021-07-15 NOTE — Assessment & Plan Note (Signed)
Healthy weight loss discussed 

## 2021-07-16 NOTE — Progress Notes (Signed)
Reviewed and agree with assessment/plan. ? ? ?Hulda Reddix, MD ?[facility] ?07/16/2021, 7:20 AM ?Pager:  [phone] ? ?

## 2021-07-17 ENCOUNTER — Ambulatory Visit
Admission: RE | Admit: 2021-07-17 | Discharge: 2021-07-17 | Disposition: A | Discharge status: Home / Self Care | Payer: BC Managed Care – PPO | Source: Ambulatory Visit | Attending: Family | Admitting: Family

## 2021-07-17 DIAGNOSIS — Z1231 Encounter for screening mammogram for malignant neoplasm of breast: Secondary | ICD-10-CM | POA: Insufficient documentation

## 2021-07-25 ENCOUNTER — Ambulatory Visit: Payer: BC Managed Care – PPO | Admitting: Family

## 2021-08-08 ENCOUNTER — Ambulatory Visit (INDEPENDENT_AMBULATORY_CARE_PROVIDER_SITE_OTHER): Payer: BC Managed Care – PPO

## 2021-08-08 ENCOUNTER — Other Ambulatory Visit: Payer: Self-pay

## 2021-08-08 ENCOUNTER — Telehealth: Payer: Self-pay

## 2021-08-08 ENCOUNTER — Telehealth: Payer: Self-pay | Admitting: Family

## 2021-08-08 ENCOUNTER — Ambulatory Visit: Payer: BC Managed Care – PPO | Admitting: Family

## 2021-08-08 DIAGNOSIS — R0683 Snoring: Secondary | ICD-10-CM

## 2021-08-08 DIAGNOSIS — I10 Essential (primary) hypertension: Secondary | ICD-10-CM

## 2021-08-08 DIAGNOSIS — D649 Anemia, unspecified: Secondary | ICD-10-CM

## 2021-08-08 DIAGNOSIS — E1110 Type 2 diabetes mellitus with ketoacidosis without coma: Secondary | ICD-10-CM

## 2021-08-08 NOTE — Telephone Encounter (Signed)
Pt called stating she can wait for an appointment but provider wanted to recheck her iron

## 2021-08-08 NOTE — Telephone Encounter (Signed)
Pt scheduled for labs 6/9 prior to appointment. I have ordered.

## 2021-08-15 ENCOUNTER — Other Ambulatory Visit: Payer: BC Managed Care – PPO

## 2021-08-19 NOTE — Telephone Encounter (Signed)
I have spoke with Julia Brown and she has been rescheduled to pick up HST machine on 08/22/21 @ 4:30pm

## 2021-08-19 NOTE — Telephone Encounter (Signed)
Anita, please advise. Thanks 

## 2021-08-22 DIAGNOSIS — G4733 Obstructive sleep apnea (adult) (pediatric): Secondary | ICD-10-CM | POA: Diagnosis not present

## 2021-08-29 ENCOUNTER — Other Ambulatory Visit (INDEPENDENT_AMBULATORY_CARE_PROVIDER_SITE_OTHER): Payer: BC Managed Care – PPO

## 2021-08-29 ENCOUNTER — Other Ambulatory Visit: Payer: Self-pay | Admitting: *Deleted

## 2021-08-29 DIAGNOSIS — G4733 Obstructive sleep apnea (adult) (pediatric): Secondary | ICD-10-CM

## 2021-08-29 DIAGNOSIS — D649 Anemia, unspecified: Secondary | ICD-10-CM

## 2021-08-29 DIAGNOSIS — E1110 Type 2 diabetes mellitus with ketoacidosis without coma: Secondary | ICD-10-CM

## 2021-08-29 DIAGNOSIS — I10 Essential (primary) hypertension: Secondary | ICD-10-CM

## 2021-08-29 NOTE — Addendum Note (Signed)
Addended by: Leeanne Rio on: 08/29/2021 02:35 PM   Modules accepted: Orders

## 2021-08-29 NOTE — Addendum Note (Signed)
Addended by: Leeanne Rio on: 08/29/2021 02:33 PM   Modules accepted: Orders

## 2021-08-30 LAB — CBC WITH DIFFERENTIAL/PLATELET
Basophils Absolute: 0.1 10*3/uL (ref 0.0–0.2)
Basos: 1 %
EOS (ABSOLUTE): 0.1 10*3/uL (ref 0.0–0.4)
Eos: 1 %
Hematocrit: 33.9 % — ABNORMAL LOW (ref 34.0–46.6)
Hemoglobin: 11.4 g/dL (ref 11.1–15.9)
Immature Grans (Abs): 0.1 10*3/uL (ref 0.0–0.1)
Immature Granulocytes: 1 %
Lymphocytes Absolute: 3 10*3/uL (ref 0.7–3.1)
Lymphs: 29 %
MCH: 29.3 pg (ref 26.6–33.0)
MCHC: 33.6 g/dL (ref 31.5–35.7)
MCV: 87 fL (ref 79–97)
Monocytes Absolute: 0.8 10*3/uL (ref 0.1–0.9)
Monocytes: 8 %
Neutrophils Absolute: 6.5 10*3/uL (ref 1.4–7.0)
Neutrophils: 60 %
Platelets: 291 10*3/uL (ref 150–450)
RBC: 3.89 x10E6/uL (ref 3.77–5.28)
RDW: 12.2 % (ref 11.7–15.4)
WBC: 10.6 10*3/uL (ref 3.4–10.8)

## 2021-08-30 LAB — HEMOGLOBIN A1C
Est. average glucose Bld gHb Est-mCnc: 226 mg/dL
Hgb A1c MFr Bld: 9.5 % — ABNORMAL HIGH (ref 4.8–5.6)

## 2021-08-30 LAB — COMPREHENSIVE METABOLIC PANEL
ALT: 20 IU/L (ref 0–32)
AST: 16 IU/L (ref 0–40)
Albumin/Globulin Ratio: 2 (ref 1.2–2.2)
Albumin: 4.5 g/dL (ref 3.8–4.8)
Alkaline Phosphatase: 70 IU/L (ref 44–121)
BUN/Creatinine Ratio: 16 (ref 9–23)
BUN: 14 mg/dL (ref 6–24)
Bilirubin Total: 0.3 mg/dL (ref 0.0–1.2)
CO2: 24 mmol/L (ref 20–29)
Calcium: 10.2 mg/dL (ref 8.7–10.2)
Chloride: 99 mmol/L (ref 96–106)
Creatinine, Ser: 0.88 mg/dL (ref 0.57–1.00)
Globulin, Total: 2.2 g/dL (ref 1.5–4.5)
Glucose: 124 mg/dL — ABNORMAL HIGH (ref 70–99)
Potassium: 4.3 mmol/L (ref 3.5–5.2)
Sodium: 138 mmol/L (ref 134–144)
Total Protein: 6.7 g/dL (ref 6.0–8.5)
eGFR: 80 mL/min/{1.73_m2} (ref >59)

## 2021-08-30 LAB — IRON,TIBC AND FERRITIN PANEL
Ferritin: 303 ng/mL — ABNORMAL HIGH (ref 15–150)
Iron Saturation: 26 % (ref 15–55)
Iron: 84 ug/dL (ref 27–159)
Total Iron Binding Capacity: 318 ug/dL (ref 250–450)
UIBC: 234 ug/dL (ref 131–425)

## 2021-09-01 ENCOUNTER — Other Ambulatory Visit: Payer: Self-pay | Admitting: Family

## 2021-09-01 DIAGNOSIS — R7989 Other specified abnormal findings of blood chemistry: Secondary | ICD-10-CM

## 2021-09-03 ENCOUNTER — Telehealth: Payer: Self-pay | Admitting: Adult Health

## 2021-09-03 NOTE — Telephone Encounter (Signed)
-----   Message from Courtney Paris sent at 09/02/2021  4:07 PM EDT ----- Hello this patient's HST report is ready for review

## 2021-09-03 NOTE — Telephone Encounter (Signed)
Home sleep study completed on August 24, 2021 shows mild sleep apnea with a AHI of 12.9/hour and SPO2 low at 74%. Please set up office visit to discuss results and treatment plan

## 2021-09-05 ENCOUNTER — Other Ambulatory Visit: Payer: Self-pay | Admitting: Family

## 2021-09-05 NOTE — Telephone Encounter (Signed)
ATC x1.  No answer.  LVM to return call.  Patient has a f/u in place with Tammy in [place] on 09/12/21 at 4 pm.  Advised she can call the office for results of sleep study.

## 2021-09-09 NOTE — Telephone Encounter (Signed)
Has f/u with Rubye Oaks NP on 09/12/21.  Nothing further needed.

## 2021-09-12 ENCOUNTER — Encounter: Payer: Self-pay | Admitting: Adult Health

## 2021-09-12 ENCOUNTER — Ambulatory Visit (INDEPENDENT_AMBULATORY_CARE_PROVIDER_SITE_OTHER): Payer: BC Managed Care – PPO | Admitting: Adult Health

## 2021-09-12 DIAGNOSIS — G4733 Obstructive sleep apnea (adult) (pediatric): Secondary | ICD-10-CM

## 2021-09-12 DIAGNOSIS — E669 Obesity, unspecified: Secondary | ICD-10-CM

## 2021-09-12 NOTE — Progress Notes (Signed)
@Patient  ID: Julia Brown, female    DOB: [birth year], 51 y.o.   MRN: [id number]  Chief Complaint  Patient presents with   Follow-up    Referring provider: Allegra Grana, FNP  HPI: 51 year old female seen for sleep consult July 15, 2021 with daytime sleepiness and snoring found to have mild obstructive sleep apnea Medical history significant for hypertension, diabetes  TEST/EVENTS :  Home sleep study completed on August 24, 2021 shows mild sleep apnea with a AHI of 12.9/hour and SPO2 low at 74%.  09/12/2021 Follow up: OSA  Patient presents for a 59-month follow-up.  Patient was seen last visit for sleep consult.  She had daytime sleepiness and snoring.  She was set up for home sleep study done on August 24, 2021 that showed mild sleep apnea the AHI of 12.9/hour and SPO2 low at 74%. We discussed her sleep study results and went over treatment options.  We discussed weight loss, oral appliance and CPAP.  Patient like to proceed with CPAP therapy.  Patient education was given.   Allergies  Allergen Reactions   Contrast Media [Iodinated Contrast Media] Anaphylaxis    Wheezing and rash immediately after CT w/.   Statins Palpitations   Jardiance [Empagliflozin]     DKA hospitalized 05/2020   Bactrim [Sulfamethoxazole-Trimethoprim] Itching    itching   Penicillins Rash   Trulicity [Dulaglutide] Nausea Only    Immunization History  Administered Date(s) Administered   19-influenza Whole 12/03/2017   Influenza Split 01/13/2012, 01/02/2014   Influenza,inj,Quad PF,6+ Mos 12/12/2020   Influenza-Unspecified 12/31/2014, 01/04/2016, 12/30/2016, 12/12/2019   PFIZER(Purple Top)SARS-COV-2 Vaccination 04/03/2019, 04/25/2019, 02/27/2020   Pneumococcal Polysaccharide-23 04/23/2009   Tdap 01/21/2009, 08/21/2017    Past Medical History:  Diagnosis Date   Chicken pox    Diabetes mellitus    dx 24, dx 06/19/20   GERD (gastroesophageal reflux disease)    Hyperlipidemia    Hypertension     UTI (lower urinary tract infection)     Tobacco History: Social History   Tobacco Use  Smoking Status Some Days   Packs/day: 0.75   Years: 15.00   Total pack years: 11.25   Types: Cigarettes  Smokeless Tobacco Never  Tobacco Comments   1 pack per week 09/12/2021   Ready to quit: Not Answered Counseling given: Not Answered Tobacco comments: 1 pack per week 09/12/2021   Outpatient Medications Prior to Visit  Medication Sig Dispense Refill   amLODipine (NORVASC) 5 MG tablet Take 1 tablet (5 mg total) by mouth daily. 30 tablet 5   carvedilol (COREG) 3.125 MG tablet Take 1 tablet (3.125 mg total) by mouth 2 (two) times a day with meals. 180 tablet 6   Continuous Blood Gluc Receiver (FREESTYLE LIBRE 2 READER) DEVI 1 Device by Does not apply route every 14 (fourteen) days. 2 each 0   Continuous Blood Gluc Sensor (FREESTYLE LIBRE 2 SENSOR) MISC Place once sensor every 14 days. 2 each 2   Insulin Glargine (BASAGLAR KWIKPEN) 100 UNIT/ML 40 units daily with food increase every 3 days by 3 units for goal am sugar 90 to <130 am. Stop rybelsus (Patient taking differently: 50 Units. 40 units daily with food increase every 3 days by 3 units for goal am sugar 90 to <130 am. Stop rybelsus) 300 mL 2   losartan-hydrochlorothiazide (HYZAAR) 100-25 MG tablet Take 1 tablet by mouth daily. 90 tablet 4   metFORMIN (GLUCOPHAGE-XR) 500 MG 24 hr tablet Take 2 tablets (1,000 mg total) by mouth daily  with breakfast. 180 tablet 1   Multiple Vitamins-Minerals (MULTIVITAMIN WITH MINERALS) tablet Take 1 tablet by mouth daily.     pantoprazole (PROTONIX) 40 MG tablet Take 1 tablet (40 mg total) by mouth daily. 90 tablet 1   rosuvastatin (CRESTOR) 20 MG tablet Take 1 tablet (20 mg total) by mouth every other day. 45 tablet 3   scopolamine (TRANSDERM-SCOP) 1 MG/3DAYS Place 1 patch (1.5 mg total) onto the skin every 3 (three) days. 10 patch 0   Semaglutide,0.25 or 0.5MG /DOS, (OZEMPIC, 0.25 OR 0.5 MG/DOSE,) 2 MG/1.5ML SOPN  Inject 0.5 mg into the skin once a week.     No facility-administered medications prior to visit.     Review of Systems:   Constitutional:   No  weight loss, night sweats,  Fevers, chills, + fatigue, or  lassitude.  HEENT:   No headaches,  Difficulty swallowing,  Tooth/dental problems, or  Sore throat,                No sneezing, itching, ear ache, nasal congestion, post nasal drip,   CV:  No chest pain,  Orthopnea, PND, swelling in lower extremities, anasarca, dizziness, palpitations, syncope.   GI  No heartburn, indigestion, abdominal pain, nausea, vomiting, diarrhea, change in bowel habits, loss of appetite, bloody stools.   Resp: No shortness of breath with exertion or at rest.  No excess mucus, no productive cough,  No non-productive cough,  No coughing up of blood.  No change in color of mucus.  No wheezing.  No chest wall deformity  Skin: no rash or lesions.  GU: no dysuria, change in color of urine, no urgency or frequency.  No flank pain, no hematuria   MS:  No joint pain or swelling.  No decreased range of motion.  No back pain.    Physical Exam  BP 126/78 (BP Location: Left Arm, Cuff Size: Large)   Pulse 88   Temp 98.2 F (36.8 C) (Temporal)   Ht 5\' 10"  (1.778 m)   Wt 235 lb 9.6 oz (106.9 kg)   SpO2 97%   BMI 33.81 kg/m   GEN: A/Ox3; pleasant , NAD, well nourished    HEENT:  [place]/AT,   NOSE-clear, THROAT-clear, no lesions, no postnasal drip or exudate noted.  Class 3 MP airway   NECK:  Supple w/ fair ROM; no JVD; normal carotid impulses w/o bruits; no thyromegaly or nodules palpated; no lymphadenopathy.    RESP  Clear  P & A; w/o, wheezes/ rales/ or rhonchi. no accessory muscle use, no dullness to percussion  CARD:  RRR, no m/r/g, no peripheral edema, pulses intact, no cyanosis or clubbing.  GI:   Soft & nt; nml bowel sounds; no organomegaly or masses detected.   Musco: Warm bil, no deformities or joint swelling noted.   Neuro: alert, no focal deficits  noted.    Skin: Warm, no lesions or rashes    Lab Results:  CBC    Component Value Date/Time   WBC 10.6 08/29/2021 1502   WBC 10.7 05/23/2021 1642   RBC 3.89 08/29/2021 1502   RBC 3.72 (L) 05/23/2021 1642   HGB 11.4 08/29/2021 1502   HCT 33.9 (L) 08/29/2021 1502   PLT 291 08/29/2021 1502   MCV 87 08/29/2021 1502   MCV 88 11/29/2011 2215   MCH 29.3 08/29/2021 1502   MCH 29.6 05/23/2021 1642   MCHC 33.6 08/29/2021 1502   MCHC 33.2 05/23/2021 1642   RDW 12.2 08/29/2021 1502   RDW  13.3 11/29/2011 2215   LYMPHSABS 3.0 08/29/2021 1502   MONOABS 0.6 11/28/2020 0804   EOSABS 0.1 08/29/2021 1502   BASOSABS 0.1 08/29/2021 1502    BMET    Component Value Date/Time   NA 138 08/29/2021 1502   NA 138 11/29/2011 2215   K 4.3 08/29/2021 1502   K 4.2 11/29/2011 2215   CL 99 08/29/2021 1502   CL 104 11/29/2011 2215   CO2 24 08/29/2021 1502   CO2 25 11/29/2011 2215   GLUCOSE 124 (H) 08/29/2021 1502   GLUCOSE 207 (H) 05/23/2021 1642   GLUCOSE 166 (H) 11/29/2011 2215   BUN 14 08/29/2021 1502   BUN 11 11/29/2011 2215   CREATININE 0.88 08/29/2021 1502   CREATININE 0.91 05/23/2021 1642   CALCIUM 10.2 08/29/2021 1502   CALCIUM 9.5 11/29/2011 2215   GFRNONAA >60 06/21/2020 0730   GFRNONAA >60 11/29/2011 2215   GFRAA >60 01/28/2019 0835   GFRAA >60 11/29/2011 2215    BNP No results found for: "BNP"  ProBNP No results found for: "PROBNP"  Imaging: No results found.        No data to display          No results found for: "NITRICOXIDE"      Assessment & Plan:   OSA (obstructive sleep apnea) Mild OSA with nocturnal desaturations - patient education given and test results reviewed  Will proceed with CPAP auto set - 5-15cm  - discussed how weight can impact sleep and risk for sleep disordered breathing - discussed options to assist with weight loss: combination of diet modification, cardiovascular and strength training exercises   - had an extensive  discussion regarding the adverse health consequences related to untreated sleep disordered breathing - specifically discussed the risks for hypertension, coronary artery disease, cardiac dysrhythmias, cerebrovascular disease, and diabetes - lifestyle modification discussed   - discussed how sleep disruption can increase risk of accidents, particularly when driving - safe driving practices were discussed   Plan  . Patient Instructions  Begin CPAP At bedtime  , wear all night long for at least 6hrs or more  Do not drive if sleepy  Work on healthy weight loss .  Healthy sleep regimen  Follow up in 3 months and As needed       Obesity (BMI 30-39.9) Healthy weight loss     Rubye Oaks, NP 09/12/2021

## 2021-10-01 ENCOUNTER — Telehealth: Payer: Self-pay | Admitting: Family

## 2021-10-01 ENCOUNTER — Other Ambulatory Visit: Payer: Self-pay | Admitting: Family

## 2021-10-01 DIAGNOSIS — E119 Type 2 diabetes mellitus without complications: Secondary | ICD-10-CM

## 2021-10-01 NOTE — Telephone Encounter (Signed)
Call pt Rec'd refill request for basaglar Diabetes is managed by endocrine Please ask her to call endocrine as I do not know how she is taking basaglar  It does appear she is overdue for follow up with endocrine, a1c. Please encourage her to schedule as well

## 2021-10-02 NOTE — Telephone Encounter (Signed)
Spoke to patient about her message regarding her basaglar ad she stated that she just is not financially able to visit him right now  and she stated that she is taking 52 units of the basaglar. She does have appt scheduled with [place] next week on the 21st. I did put the order for poct A1C?

## 2021-10-03 MED ORDER — BASAGLAR KWIKPEN 100 UNIT/ML ~~LOC~~ SOPN: 52.0000 [IU] | PEN_INJECTOR | Freq: Every morning | SUBCUTANEOUS | 3 refills | Status: DC

## 2021-10-03 NOTE — Telephone Encounter (Signed)
I have refilled basaglar

## 2021-10-03 NOTE — Addendum Note (Signed)
Addended by: Allegra Grana on: 10/03/2021 11:08 AM   Modules accepted: Orders

## 2021-10-10 ENCOUNTER — Ambulatory Visit: Payer: BC Managed Care – PPO | Admitting: Family

## 2021-10-10 ENCOUNTER — Encounter: Payer: Self-pay | Admitting: Family

## 2021-10-10 VITALS — BP 134/68 | HR 104 | Temp 97.8°F | Ht 70.0 in | Wt 239.8 lb

## 2021-10-10 DIAGNOSIS — E1110 Type 2 diabetes mellitus with ketoacidosis without coma: Secondary | ICD-10-CM

## 2021-10-10 DIAGNOSIS — I10 Essential (primary) hypertension: Secondary | ICD-10-CM | POA: Diagnosis not present

## 2021-10-10 MED ORDER — METFORMIN HCL ER 500 MG PO TB24: 1000.0000 mg | ORAL_TABLET | Freq: Two times a day (BID) | ORAL | 3 refills | Status: DC

## 2021-10-10 NOTE — Progress Notes (Signed)
Subjective:    Patient ID: Julia Brown, female    DOB: 10/27/1970, 51 y.o.   MRN: [id number]  CC: Julia Brown is a 51 y.o. female who presents today for follow up.   HPI: Due to cost, she is no longer following with endocrine.  She is compliant with 52 units of Basaglar.  With Ozempic 0.5 mg, metformin 1000 mg twice daily.  She is tolerating metformin however she does feel nauseous 1 or 2 days after the Ozempic.  Nausea will resolve. Otherwise she feels pleased with regimen.   She is no longer wearing CGM. FBG 120-150. She will have FBG escalations of 215. No vision changes, polyuria.  No hypoglycemic episodes.      Hypertension-compliant with amlodipine 5 mg, carvedilol 3.125 mg twice daily, losartan-hydrochlorothiazide 100-25 mg.  No cp.    HISTORY:  Past Medical History:  Diagnosis Date   Chicken pox    Diabetes mellitus    dx 49, dx 06/19/20   GERD (gastroesophageal reflux disease)    Hyperlipidemia    Hypertension    UTI (lower urinary tract infection)    Past Surgical History:  Procedure Laterality Date   ABLATION  2007   excessive bleeding   CESAREAN SECTION  1994   CHOLECYSTECTOMY  1993   COLONOSCOPY WITH PROPOFOL N/A 07/25/2020   Procedure: COLONOSCOPY WITH PROPOFOL;  Surgeon: Wyline Mood, MD;  Location: [facility] ENDOSCOPY;  Service: Gastroenterology;  Laterality: N/A;   ESOPHAGOGASTRODUODENOSCOPY N/A 06/22/2020   Procedure: ESOPHAGOGASTRODUODENOSCOPY (EGD);  Surgeon: Tol[place], Boykin Nearing, MD;  Location: [facility] ENDOSCOPY;  Service: Gastroenterology;  Laterality: N/A;   ORIF FEMUR FRACTURE Right    [place]   TUBAL LIGATION     uterine ablation     Family History  Problem Relation Age of Onset   Hyperlipidemia Mother    Hypertension Mother    Schizophrenia Mother    Depression Mother    Diabetes Mother    Heart disease Father    Hyperlipidemia Father    Diabetes Father    Cancer Maternal Aunt        breast   Diabetes Maternal Grandmother    Diabetes  Maternal Grandfather    Heart disease Other    Thyroid cancer Neg Hx    Breast cancer Neg Hx     Allergies: Contrast media [iodinated contrast media], Statins, Jardiance [empagliflozin], Bactrim [sulfamethoxazole-trimethoprim], Penicillins, and Trulicity [dulaglutide] Current Outpatient Medications on File Prior to Visit  Medication Sig Dispense Refill   amLODipine (NORVASC) 5 MG tablet Take 1 tablet (5 mg total) by mouth daily. 30 tablet 5   carvedilol (COREG) 3.125 MG tablet Take 1 tablet (3.125 mg total) by mouth 2 (two) times a day with meals. 180 tablet 6   Continuous Blood Gluc Receiver (FREESTYLE LIBRE 2 READER) DEVI 1 Device by Does not apply route every 14 (fourteen) days. 2 each 0   Continuous Blood Gluc Sensor (FREESTYLE LIBRE 2 SENSOR) MISC Place once sensor every 14 days. 2 each 2   Insulin Glargine (BASAGLAR KWIKPEN) 100 UNIT/ML Inject 52 Units into the skin every morning. 15 mL 3   losartan-hydrochlorothiazide (HYZAAR) 100-25 MG tablet Take 1 tablet by mouth daily. 90 tablet 4   Multiple Vitamins-Minerals (MULTIVITAMIN WITH MINERALS) tablet Take 1 tablet by mouth daily.     pantoprazole (PROTONIX) 40 MG tablet Take 1 tablet (40 mg total) by mouth daily. 90 tablet 1   rosuvastatin (CRESTOR) 20 MG tablet Take 1 tablet (20 mg total) by  mouth every other day. 45 tablet 3   scopolamine (TRANSDERM-SCOP) 1 MG/3DAYS Place 1 patch (1.5 mg total) onto the skin every 3 (three) days. 10 patch 0   No current facility-administered medications on file prior to visit.    Social History   Tobacco Use   Smoking status: Some Days    Packs/day: 0.75    Years: 15.00    Total pack years: 11.25    Types: Cigarettes   Smokeless tobacco: Never   Tobacco comments:    1 pack per week 09/12/2021  Vaping Use   Vaping Use: Never used  Substance Use Topics   Alcohol use: Yes    Alcohol/week: 0.0 standard drinks of alcohol    Comment: weekend 3-4drinks   Drug use: No    Review of Systems   Constitutional:  Negative for chills and fever.  Respiratory:  Negative for cough.   Cardiovascular:  Negative for chest pain and palpitations.  Gastrointestinal:  Negative for nausea and vomiting.      Objective:    BP 134/68 (BP Location: Left Arm, Patient Position: Sitting, Cuff Size: Large)   Pulse (!) 104   Temp 97.8 F (36.6 C) (Oral)   Ht 5\' 10"  (1.778 m)   Wt 239 lb 12.8 oz (108.8 kg)   LMP 09/05/2021 (Exact Date)   SpO2 97%   BMI 34.41 kg/m  BP Readings from Last 3 Encounters:  10/10/21 134/68  09/12/21 126/78  07/15/21 126/68   Wt Readings from Last 3 Encounters:  10/10/21 239 lb 12.8 oz (108.8 kg)  09/12/21 235 lb 9.6 oz (106.9 kg)  07/15/21 244 lb 6.4 oz (110.9 kg)    Physical Exam Vitals reviewed.  Constitutional:      Appearance: She is well-developed.  Eyes:     Conjunctiva/sclera: Conjunctivae normal.  Cardiovascular:     Rate and Rhythm: Normal rate and regular rhythm.     Pulses: Normal pulses.     Heart sounds: Normal heart sounds.  Pulmonary:     Effort: Pulmonary effort is normal.     Breath sounds: Normal breath sounds. No wheezing, rhonchi or rales.  Skin:    General: Skin is warm and dry.  Neurological:     Mental Status: She is alert.  Psychiatric:        Speech: Speech normal.        Behavior: Behavior normal.        Thought Content: Thought content normal.        Assessment & Plan:   Problem List Items Addressed This Visit       Cardiovascular and Mediastinum   Essential hypertension (Chronic)    Chronic, stable.  Continue amlodipine 5 mg, carvedilol 3.125 mg twice daily, losartan-hydrochlorothiazide 100-25 mg.  No cp.         Endocrine   DM (diabetes mellitus) (HCC) - Primary    Lab Results  Component Value Date   HGBA1C 9.5 (H) 08/29/2021  Uncontrolled.  No recent follow-up with endocrine although patient does plan to reestablish with endocrine.  Patient had had nausea on Ozempic.  She feels comfortable staying on  current Ozempic dose of 0.5 mg however we opted not to increase Ozempic today.  We will increase metformin from 1000 mg daily to 2000 mg daily as tolerated.  Continue Basaglar 52 units however plan to decrease metformin as blood sugar improves.       Relevant Medications   metFORMIN (GLUCOPHAGE-XR) 500 MG 24 hr tablet   Other  Relevant Orders   Iron, TIBC and Ferritin Panel     I have discontinued Annice T. Ellefson's Ozempic (0.25 or 0.5 MG/DOSE). I have also changed her metFORMIN. Additionally, I am having her maintain her multivitamin with minerals, FreeStyle Libre 2 Sensor, rosuvastatin, FreeStyle Libre 2 Reader, carvedilol, amLODipine, pantoprazole, scopolamine, losartan-hydrochlorothiazide, and Basaglar KwikPen.   Meds ordered this encounter  Medications   metFORMIN (GLUCOPHAGE-XR) 500 MG 24 hr tablet    Sig: Take 2 tablets (1,000 mg total) by mouth in the morning and at bedtime.    Dispense:  120 tablet    Refill:  3    Order Specific Question:   Supervising Provider    Answer:   Sherlene Shams [[id number]]    Return precautions given.   Risks, benefits, and alternatives of the medications and treatment plan prescribed today were discussed, and patient expressed understanding.   Education regarding symptom management and diagnosis given to patient on AVS.  Continue to follow with Allegra Grana, FNP for routine health maintenance.   [name] and I agreed with plan.   Rennie Plowman, FNP

## 2021-10-10 NOTE — Assessment & Plan Note (Signed)
Chronic, stable.  Continue amlodipine 5 mg, carvedilol 3.125 mg twice daily, losartan-hydrochlorothiazide 100-25 mg.  No cp.

## 2021-10-10 NOTE — Assessment & Plan Note (Signed)
Lab Results  Component Value Date   HGBA1C 9.5 (H) 08/29/2021   Uncontrolled.  No recent follow-up with endocrine although patient does plan to reestablish with endocrine.  Patient had had nausea on Ozempic.  She feels comfortable staying on current Ozempic dose of 0.5 mg however we opted not to increase Ozempic today.  We will increase metformin from 1000 mg daily to 2000 mg daily as tolerated.  Continue Basaglar 52 units however plan to decrease metformin as blood sugar improves.

## 2021-10-11 LAB — IRON,TIBC AND FERRITIN PANEL
%SAT: 19 % (calc) (ref 16–45)
Ferritin: 185 ng/mL (ref 16–232)
Iron: 56 ug/dL (ref 45–160)
TIBC: 301 mcg/dL (calc) (ref 250–450)

## 2021-11-30 ENCOUNTER — Other Ambulatory Visit: Payer: Self-pay | Admitting: Family

## 2021-12-05 ENCOUNTER — Telehealth: Payer: Self-pay | Admitting: *Deleted

## 2021-12-05 NOTE — Telephone Encounter (Signed)
Called and spoke with Nida Boatman ([employer]), he states that patient has not been set up with her CPAP yet, they have been unable to reach her after multiple attempts.  She has been left multiple VM messages and also mailed out a letter from [employer].  Currently her order for her CPAP is on hold since they have been unable to reach her.  Nida Boatman said if we are able to reach her to have her call [phone] and ask to speak with CPAP scheduling and they will re activate her account and get her scheduled.  [facility] patient x1.  VM full.

## 2021-12-08 ENCOUNTER — Ambulatory Visit: Payer: BC Managed Care – PPO | Admitting: Adult Health

## 2021-12-24 ENCOUNTER — Other Ambulatory Visit: Payer: Self-pay | Admitting: Family

## 2021-12-29 ENCOUNTER — Encounter: Payer: Self-pay | Admitting: Family

## 2021-12-30 ENCOUNTER — Other Ambulatory Visit: Payer: Self-pay | Admitting: Family

## 2022-01-19 ENCOUNTER — Encounter (INDEPENDENT_AMBULATORY_CARE_PROVIDER_SITE_OTHER): Payer: Self-pay

## 2022-01-23 ENCOUNTER — Ambulatory Visit: Payer: BC Managed Care – PPO | Admitting: Family

## 2022-01-30 ENCOUNTER — Ambulatory Visit: Payer: BC Managed Care – PPO | Admitting: Family

## 2022-01-30 ENCOUNTER — Encounter: Payer: Self-pay | Admitting: Family

## 2022-01-30 VITALS — BP 130/80 | HR 84 | Temp 97.2°F | Ht 70.0 in | Wt 239.0 lb

## 2022-01-30 DIAGNOSIS — I10 Essential (primary) hypertension: Secondary | ICD-10-CM | POA: Diagnosis not present

## 2022-01-30 DIAGNOSIS — E1110 Type 2 diabetes mellitus with ketoacidosis without coma: Secondary | ICD-10-CM | POA: Diagnosis not present

## 2022-01-30 DIAGNOSIS — R300 Dysuria: Secondary | ICD-10-CM | POA: Diagnosis not present

## 2022-01-30 DIAGNOSIS — I152 Hypertension secondary to endocrine disorders: Secondary | ICD-10-CM

## 2022-01-30 DIAGNOSIS — E1159 Type 2 diabetes mellitus with other circulatory complications: Secondary | ICD-10-CM | POA: Diagnosis not present

## 2022-01-30 LAB — LIPID PANEL
Cholesterol: 144 mg/dL (ref <200)
HDL: 55 mg/dL (ref >=50)
LDL Cholesterol (Calc): 65 mg/dL (calc)
Non-HDL Cholesterol (Calc): 89 mg/dL (calc) (ref <130)
Total CHOL/HDL Ratio: 2.6 (calc) (ref <5.0)
Triglycerides: 161 mg/dL — ABNORMAL HIGH (ref <150)

## 2022-01-30 LAB — COMPREHENSIVE METABOLIC PANEL
AG Ratio: 1.8 (calc) (ref 1.0–2.5)
ALT: 22 U/L (ref 6–29)
AST: 18 U/L (ref 10–35)
Albumin: 4.4 g/dL (ref 3.6–5.1)
Alkaline phosphatase (APISO): 64 U/L (ref 37–153)
BUN: 16 mg/dL (ref 7–25)
CO2: 29 mmol/L (ref 20–32)
Calcium: 9.9 mg/dL (ref 8.6–10.4)
Chloride: 101 mmol/L (ref 98–110)
Creat: 0.99 mg/dL (ref 0.50–1.03)
Globulin: 2.5 g/dL (calc) (ref 1.9–3.7)
Glucose, Bld: 85 mg/dL (ref 65–99)
Potassium: 4.3 mmol/L (ref 3.5–5.3)
Sodium: 138 mmol/L (ref 135–146)
Total Bilirubin: 0.5 mg/dL (ref 0.2–1.2)
Total Protein: 6.9 g/dL (ref 6.1–8.1)

## 2022-01-30 LAB — POCT GLYCOSYLATED HEMOGLOBIN (HGB A1C): Hemoglobin A1C: 8.8 % — AB (ref 4.0–5.6)

## 2022-01-30 MED ORDER — PIOGLITAZONE HCL 15 MG PO TABS: 15.0000 mg | ORAL_TABLET | Freq: Every day | ORAL | 1 refills | Status: DC

## 2022-01-30 NOTE — Assessment & Plan Note (Signed)
Lab Results  Component Value Date   HGBA1C 8.8 (A) 01/30/2022   Some improvement.  Patient has not been checking glucose at this time and we will provided her with a new glucometer.  She has sulfa allergy so we opted not to start glipizide.  Instead, based on nausea with GLP-1 agonist, we will stop Ozempic and start Actos 15 mg.  She will continue metformin 1000 mg twice daily, Basaglar 52 units.  Consider increasing basal insulin or starting meal dose insulin with lunch, largest meal.  Close follow-up

## 2022-01-30 NOTE — Progress Notes (Signed)
Subjective:    Patient ID: Julia Brown, female    DOB: [birth year], 51 y.o.   MRN: [id number]  CC: Julia Brown is a 51 y.o. female who presents today for follow up.   HPI: Complains of dysuria , urinary frequency for a couple of weeks, unchanged.   She took one dose of diflucan.    No fever, hematuria, abnormal vaginal discharge  She has temporary relief with azo.      HTN- compliant with amlodipine 5 mg, carvedilol 3.125 mg twice daily, losartan-hydrochlorothiazide 100-25 mg.   DM- compliant Ozempic dose of 0.5 mg,metformin from 1000 mg BID ,Basaglar 52 units . She has been on glipizide in the past.  She has daily nausea from ozempic.  Her biggest meal is lunch.  No constipation. She has been on glipizide in the past and does not recall rash.  She has a sulfa allergy She has been on CGM however it was not covered by insurance.  She has misplaced her glucometer      HISTORY:  Past Medical History:  Diagnosis Date   Chicken pox    Diabetes mellitus    dx 44, dx 06/19/20   GERD (gastroesophageal reflux disease)    Hyperlipidemia    Hypertension    UTI (lower urinary tract infection)    Past Surgical History:  Procedure Laterality Date   ABLATION  2007   excessive bleeding   CESAREAN SECTION  1994   CHOLECYSTECTOMY  1993   COLONOSCOPY WITH PROPOFOL N/A 07/25/2020   Procedure: COLONOSCOPY WITH PROPOFOL;  Surgeon: Wyline Mood, MD;  Location: [facility]COPY;  Service: Gastroenterology;  Laterality: N/A;   ESOPHAGOGASTRODUODENOSCOPY N/A 06/22/2020   Procedure: ESOPHAGOGASTRODUODENOSCOPY (EGD);  Surgeon: Tol[place], Boykin Nearing, MD;  Location: [facility] ENDOSCOPY;  Service: Gastroenterology;  Laterality: N/A;   ORIF FEMUR FRACTURE Right    [place]   TUBAL LIGATION     uterine ablation     Family History  Problem Relation Age of Onset   Hyperlipidemia Mother    Hypertension Mother    Schizophrenia Mother    Depression Mother    Diabetes Mother    Heart disease Father     Hyperlipidemia Father    Diabetes Father    Cancer Maternal Aunt        breast   Diabetes Maternal Grandmother    Diabetes Maternal Grandfather    Heart disease Other    Thyroid cancer Neg Hx    Breast cancer Neg Hx     Allergies: Contrast media [iodinated contrast media], Statins, Jardiance [empagliflozin], Ozempic (0.25 or 0.5 mg-dose) [semaglutide(0.25 or 0.5mg -dos)], Bactrim [sulfamethoxazole-trimethoprim], Penicillins, and Trulicity [dulaglutide] Current Outpatient Medications on File Prior to Visit  Medication Sig Dispense Refill   amLODipine (NORVASC) 5 MG tablet Take 1 tablet (5 mg total) by mouth daily. 30 tablet 5   carvedilol (COREG) 3.125 MG tablet Take 1 tablet (3.125 mg total) by mouth 2 (two) times a day with meals. 180 tablet 6   Continuous Blood Gluc Receiver (FREESTYLE LIBRE 2 READER) DEVI 1 Device by Does not apply route every 14 (fourteen) days. 2 each 0   Continuous Blood Gluc Sensor (FREESTYLE LIBRE 2 SENSOR) MISC Place once sensor every 14 days. 2 each 2   Insulin Glargine (BASAGLAR KWIKPEN) 100 UNIT/ML Inject 52 Units into the skin every morning. 15 mL 3   Insulin Pen Needle (B-D UF III MINI PEN NEEDLES) 31G X 5 MM MISC Use as directed. 100 each 11  losartan-hydrochlorothiazide (HYZAAR) 100-25 MG tablet Take 1 tablet by mouth daily. 90 tablet 4   metFORMIN (GLUCOPHAGE-XR) 500 MG 24 hr tablet Take 2 tablets (1,000 mg total) by mouth in the morning and at bedtime. 120 tablet 3   Multiple Vitamins-Minerals (MULTIVITAMIN WITH MINERALS) tablet Take 1 tablet by mouth daily.     pantoprazole (PROTONIX) 40 MG tablet Take 1 tablet (40 mg total) by mouth daily. 90 tablet 1   rosuvastatin (CRESTOR) 20 MG tablet Take 1 tablet (20 mg total) by mouth every other day. 45 tablet 3   scopolamine (TRANSDERM-SCOP) 1 MG/3DAYS Place 1 patch (1.5 mg total) onto the skin every 3 (three) days. 10 patch 0   No current facility-administered medications on file prior to visit.     Social History   Tobacco Use   Smoking status: Some Days    Packs/day: 0.75    Years: 15.00    Total pack years: 11.25    Types: Cigarettes   Smokeless tobacco: Never   Tobacco comments:    1 pack per week 09/12/2021  Vaping Use   Vaping Use: Never used  Substance Use Topics   Alcohol use: Yes    Alcohol/week: 0.0 standard drinks of alcohol    Comment: weekend 3-4drinks   Drug use: No    Review of Systems  Constitutional:  Negative for chills and fever.  Respiratory:  Negative for cough.   Cardiovascular:  Negative for chest pain and palpitations.  Gastrointestinal:  Negative for nausea and vomiting.  Genitourinary:  Positive for dysuria and frequency. Negative for difficulty urinating.      Objective:    BP 130/80 (BP Location: Left Arm, Patient Position: Sitting, Cuff Size: Normal)   Pulse 84   Temp (!) 97.2 F (36.2 C) (Oral)   Ht 5\' 10"  (1.778 m)   Wt 239 lb (108.4 kg)   LMP  (LMP Unknown)   SpO2 99%   BMI 34.29 kg/m  BP Readings from Last 3 Encounters:  01/30/22 130/80  10/10/21 134/68  09/12/21 126/78   Wt Readings from Last 3 Encounters:  01/30/22 239 lb (108.4 kg)  10/10/21 239 lb 12.8 oz (108.8 kg)  09/12/21 235 lb 9.6 oz (106.9 kg)    Physical Exam Vitals reviewed.  Constitutional:      Appearance: She is well-developed.  Cardiovascular:     Rate and Rhythm: Normal rate and regular rhythm.     Pulses: Normal pulses.     Heart sounds: Normal heart sounds.  Pulmonary:     Effort: Pulmonary effort is normal.     Breath sounds: Normal breath sounds. No wheezing, rhonchi or rales.  Skin:    General: Skin is warm and dry.  Neurological:     Mental Status: She is alert.  Psychiatric:        Speech: Speech normal.        Behavior: Behavior normal.        Thought Content: Thought content normal.        Assessment & Plan:   Problem List Items Addressed This Visit       Cardiovascular and Mediastinum   Essential hypertension  (Chronic)    Chronic, stable. Continue amlodipine 5 mg, carvedilol 3.125 mg twice daily, losartan-hydrochlorothiazide 100-25 mg.       Hypertension associated with diabetes (HCC) - Primary   Relevant Medications   pioglitazone (ACTOS) 15 MG tablet   Other Relevant Orders   POCT HgB A1C (Completed)     Endocrine  DM (diabetes mellitus) (HCC)    Lab Results  Component Value Date   HGBA1C 8.8 (A) 01/30/2022  Some improvement.  Patient has not been checking glucose at this time and we will provided her with a new glucometer.  She has sulfa allergy so we opted not to start glipizide.  Instead, based on nausea with GLP-1 agonist, we will stop Ozempic and start Actos 15 mg.  She will continue metformin 1000 mg twice daily, Basaglar 52 units.  Consider increasing basal insulin or starting meal dose insulin with lunch, largest meal.  Close follow-up      Relevant Medications   pioglitazone (ACTOS) 15 MG tablet   Other Relevant Orders   Comprehensive metabolic panel   Urine Microalbumin w/creat. ratio   Lipid panel     Other   Dysuria   Relevant Orders   Urinalysis, Routine w reflex microscopic   Urine Culture     I have discontinued Lamar T. Goers's Ozempic (0.25 or 0.5 MG/DOSE). I am also having her start on pioglitazone. Additionally, I am having her maintain her multivitamin with minerals, FreeStyle Libre 2 Sensor, rosuvastatin, FreeStyle Libre 2 Reader, carvedilol, scopolamine, losartan-hydrochlorothiazide, Basaglar KwikPen, metFORMIN, B-D UF III MINI PEN NEEDLES, pantoprazole, and amLODipine.   Meds ordered this encounter  Medications   pioglitazone (ACTOS) 15 MG tablet    Sig: Take 1 tablet (15 mg total) by mouth daily.    Dispense:  90 tablet    Refill:  1    Order Specific Question:   Supervising Provider    Answer:   Sherlene Shams [[id number]]    Return precautions given.   Risks, benefits, and alternatives of the medications and treatment plan prescribed today were  discussed, and patient expressed understanding.   Education regarding symptom management and diagnosis given to patient on AVS.  Continue to follow with Allegra Grana, FNP for routine health maintenance.   [name] and I agreed with plan.   Rennie Plowman, FNP

## 2022-01-30 NOTE — Patient Instructions (Signed)
Stop Ozempic due to nausea and loose stools.  Please let me know if symptoms do not resolve.  Instead, we will start Actos.  Continue metformin 1000 mg twice daily for now.  Please also continue Basaglar 52 units daily.  Please keep a blood sugar log of fasting blood sugar, and postprandial which is a blood sugar taken to meal 2 hours after a meal.    I would like to see blood sugars through the day over the next couple of weeks so I can titrate insulin and decide if a meal dose insulin would be appropriate.   Nice to see you!

## 2022-01-30 NOTE — Assessment & Plan Note (Signed)
Chronic, stable. Continue amlodipine 5 mg, carvedilol 3.125 mg twice daily, losartan-hydrochlorothiazide 100-25 mg.  

## 2022-02-02 ENCOUNTER — Other Ambulatory Visit: Payer: Self-pay | Admitting: Family

## 2022-02-02 NOTE — Addendum Note (Signed)
Addended by: Warden Fillers on: 02/02/2022 09:58 AM   Modules accepted: Orders

## 2022-02-04 ENCOUNTER — Other Ambulatory Visit: Payer: Self-pay

## 2022-02-04 ENCOUNTER — Other Ambulatory Visit: Payer: Self-pay | Admitting: Family

## 2022-02-04 MED ORDER — CARVEDILOL 3.125 MG PO TABS: ORAL_TABLET | ORAL | 6 refills | Status: DC

## 2022-02-04 NOTE — Telephone Encounter (Signed)
RX SENT PT AWARE 

## 2022-02-19 ENCOUNTER — Other Ambulatory Visit: Payer: Self-pay | Admitting: Family

## 2022-02-19 DIAGNOSIS — E119 Type 2 diabetes mellitus without complications: Secondary | ICD-10-CM

## 2022-03-20 ENCOUNTER — Ambulatory Visit (INDEPENDENT_AMBULATORY_CARE_PROVIDER_SITE_OTHER): Payer: BC Managed Care – PPO | Admitting: Family

## 2022-03-20 ENCOUNTER — Other Ambulatory Visit (HOSPITAL_COMMUNITY)
Admission: RE | Admit: 2022-03-20 | Discharge: 2022-03-20 | Disposition: A | Discharge status: Home / Self Care | Payer: BC Managed Care – PPO | Source: Ambulatory Visit | Attending: Family | Admitting: Family

## 2022-03-20 ENCOUNTER — Encounter: Payer: Self-pay | Admitting: Family

## 2022-03-20 VITALS — BP 136/76 | HR 86 | Temp 98.3°F | Ht 70.0 in | Wt 245.2 lb

## 2022-03-20 DIAGNOSIS — F325 Major depressive disorder, single episode, in full remission: Secondary | ICD-10-CM

## 2022-03-20 DIAGNOSIS — G629 Polyneuropathy, unspecified: Secondary | ICD-10-CM | POA: Insufficient documentation

## 2022-03-20 DIAGNOSIS — R300 Dysuria: Secondary | ICD-10-CM | POA: Diagnosis not present

## 2022-03-20 DIAGNOSIS — E1110 Type 2 diabetes mellitus with ketoacidosis without coma: Secondary | ICD-10-CM

## 2022-03-20 DIAGNOSIS — Z122 Encounter for screening for malignant neoplasm of respiratory organs: Secondary | ICD-10-CM

## 2022-03-20 DIAGNOSIS — G6289 Other specified polyneuropathies: Secondary | ICD-10-CM

## 2022-03-20 DIAGNOSIS — Z124 Encounter for screening for malignant neoplasm of cervix: Secondary | ICD-10-CM

## 2022-03-20 DIAGNOSIS — Z0000 Encounter for general adult medical examination without abnormal findings: Secondary | ICD-10-CM | POA: Diagnosis not present

## 2022-03-20 MED ORDER — BUPROPION HCL ER (XL) 150 MG PO TB24: 150.0000 mg | ORAL_TABLET | Freq: Every day | ORAL | 1 refills | Status: DC

## 2022-03-20 MED ORDER — GABAPENTIN 100 MG PO CAPS: 100.0000 mg | ORAL_CAPSULE | Freq: Three times a day (TID) | ORAL | 3 refills | Status: DC

## 2022-03-20 NOTE — Assessment & Plan Note (Addendum)
New, uncontrolled. Presentation consistent with peripheral neuropathy from diabetes and/or B12 deficiency.  No neck pain to warrant investigation of cervical stenosis at this time.  Pending B12.  Trial of gabapentin 100mg  tid.

## 2022-03-20 NOTE — Assessment & Plan Note (Signed)
Clinical breast exam performed today and Pap smear.  Referral placed for lung cancer screening program.

## 2022-03-20 NOTE — Addendum Note (Signed)
Addended by: [place], Florenda Watt on: 03/20/2022 03:21 PM   Modules accepted: Orders

## 2022-03-20 NOTE — Progress Notes (Signed)
Assessment & Plan:  Routine general medical examination at a health care facility Assessment & Plan: Clinical breast exam performed today and Pap smear.  Referral placed for lung cancer screening program.  Orders: -     Ambulatory Referral for Lung Cancer Scre  Type 2 diabetes mellitus with ketoacidosis without coma, without long-term current use of insulin (HCC) -     Microalbumin / creatinine urine ratio; Future  Dysuria -     Urine Culture -     Urinalysis, Routine w reflex microscopic; Future  Other polyneuropathy Assessment & Plan: New, uncontrolled. Presentation consistent with peripheral neuropathy from diabetes and/or B12 deficiency.  No neck pain to warrant investigation of cervical stenosis at this time.  Pending B12.  Trial of gabapentin 119m tid.   Orders: -     Gabapentin; Take 1 capsule (100 mg total) by mouth 3 (three) times daily.  Dispense: 90 capsule; Refill: 3 -     B12 and Folate Panel  Depression, major, single episode, complete remission ([facility] Assessment & Plan: New, uncontrolled. 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  Trial Wellbutrin.  Counseled on risk of seizure with alcohol and Wellbutrin.  Patient understands to have no more than 1 alcoholic beverage while on Wellbutrin.  Close follow-up  Orders: -     buPROPion HCl ER (XL); Take 1 tablet (150 mg total) by mouth daily.  Dispense: 90 tablet; Refill: 1  Screening for lung cancer -     Ambulatory Referral for Lung Cancer Scre  Screening for cervical cancer -     Cytology - PAP     Return precautions given.   Risks, benefits, and alternatives of the medications and treatment plan prescribed today were discussed, and patient expressed understanding.   Education regarding symptom management and diagnosis given to patient on AVS either electronically or printed.  No follow-ups on file.  Julia Brown  Subjective:    Patient ID: Julia Brown female    DOB: 311-Jan-1972 51y.o.    MRN: [id number] CC: Julia [name] who presents today for physical exam.    HPI: She is tearful today and feeling overwhelmed and [place]  [place] doing [place] at night.  She is working 5 days per week and caring for grandson on the weekends.  She has no time for herself.  No h/o seizure, anorexia, bulmia.  She drinks alcohol on the weekends    She complains of of daily numbness in bilateral fingertips. No neck pain or arm weakness, arm numbness. She noticed when holding a cup of coffee or when hands are cold.  DM- compliant with Actos 15 mg, metformin 1000 mg twice daily, Basaglar 52 units.  She isnt checking blood sugars often. No hypoglycemic episodes. FBG 150   Colorectal Cancer Screening: UTD , 07/2020, repeat in 10 years Breast Cancer Screening: Mammogram UTD Cervical Cancer Screening: due, 12/13/2017 NIL, neg HPV Bone Health screening/DEXA for 65+: No increased fracture risk. Defer screening at this time.  Lung Cancer Screening: smoker , 1 pack/day          Tetanus - UTD        Pneumococcal - complete   Exercise: No formal exercise.  Alcohol use: Occasional on weekends Smoking/tobacco use: smoker.    Health Maintenance  Topic Date Due   Diabetic kidney evaluation - Urine ACR  07/06/2020   PAP SMEAR-Modifier  12/13/2020   COVID-19 Vaccine (  4 - 2023-24 season) 11/21/2021   Zoster Vaccines- Shingrix (1 of 2) 05/02/2022 (Originally 05/29/2021)   OPHTHALMOLOGY EXAM  05/22/2022   FOOT EXAM  05/23/2022   HEMOGLOBIN A1C  07/31/2022   Diabetic kidney evaluation - eGFR measurement  01/31/2023   MAMMOGRAM  07/18/2023   DTaP/Tdap/Td (3 - Td or Tdap) 08/22/2027   COLONOSCOPY (Pts 45-28yr Insurance coverage will need to be confirmed)  07/26/2030   INFLUENZA VACCINE  Completed   Hepatitis C Screening  Completed   HIV Screening  Completed   HPV VACCINES  Aged Out    ALLERGIES: Contrast media [iodinated contrast media],  Statins, Jardiance [empagliflozin], Ozempic (0.25 or 0.5 mg-dose) [semaglutide(0.25 or 0.569mdos)], Bactrim [sulfamethoxazole-trimethoprim], Penicillins, and Trulicity [dulaglutide]  Current Outpatient Medications on File Prior to Visit  Medication Sig Dispense Refill   amLODipine (NORVASC) 5 MG tablet Take 1 tablet (5 mg total) by mouth daily. 30 tablet 5   carvedilol (COREG) 3.125 MG tablet Take 1 tablet (3.125 mg total) by mouth 2 (two) times a day with meals. 180 tablet 6   Continuous Blood Gluc Receiver (FREESTYLE LIBRE 2 READER) DEVI 1 Device by Does not apply route every 14 (fourteen) days. 2 each 0   Continuous Blood Gluc Sensor (FREESTYLE LIBRE 2 SENSOR) MISC Place once sensor every 14 days. 2 each 2   Insulin Glargine (BASAGLAR KWIKPEN) 100 UNIT/ML Inject 0.52 mL (52 Units total) under the skin every morning. 15 mL 3   Insulin Pen Needle (B-D UF III MINI PEN NEEDLES) 31G X 5 MM MISC Use as directed. 100 each 11   losartan-hydrochlorothiazide (HYZAAR) 100-25 MG tablet Take 1 tablet by mouth daily. 90 tablet 4   metFORMIN (GLUCOPHAGE-XR) 500 MG 24 hr tablet Take 2 tablets (1,000 mg total) by mouth in the morning and at bedtime. 120 tablet 3   Multiple Vitamins-Minerals (MULTIVITAMIN WITH MINERALS) tablet Take 1 tablet by mouth daily.     pantoprazole (PROTONIX) 40 MG tablet Take 1 tablet (40 mg total) by mouth daily. 90 tablet 1   pioglitazone (ACTOS) 15 MG tablet Take 1 tablet (15 mg total) by mouth daily. 90 tablet 1   rosuvastatin (CRESTOR) 20 MG tablet Take 1 tablet (20 mg total) by mouth daily. 90 tablet 3   scopolamine (TRANSDERM-SCOP) 1 MG/3DAYS Place 1 patch (1.5 mg total) onto the skin every 3 (three) days. 10 patch 0   No current facility-administered medications on file prior to visit.    Review of Systems  Constitutional:  Negative for chills and fever.  Respiratory:  Negative for cough.   Cardiovascular:  Negative for chest pain and palpitations.  Gastrointestinal:   Negative for nausea and vomiting.  Neurological:  Positive for numbness.  Psychiatric/Behavioral:  Positive for sleep disturbance. Negative for suicidal ideas. The patient is not nervous/anxious.       Objective:    BP 136/76   Pulse 86   Temp 98.3 F (36.8 C) (Oral)   Ht _0  (1.778 m)   Wt 245 lb 3.2 oz (111.2 kg)   LMP  (LMP Unknown)   SpO2 98%   BMI 35.18 kg/m   BP Readings from Last 3 Encounters:  03/20/22 136/76  01/30/22 130/80  10/10/21 134/68   Wt Readings from Last 3 Encounters:  03/20/22 245 lb 3.2 oz (111.2 kg)  01/30/22 239 lb (108.4 kg)  10/10/21 239 lb 12.8 oz (108.8 kg)      03/20/2022    3:00 PM 01/30/2022    3:37 PM  11/29/2020    4:04 PM  Depression screen PHQ 2/9  Decreased Interest 0 0 0  Down, Depressed, Hopeless 0 0 0  PHQ - 2 Score 0 0 0  Altered sleeping 0    Tired, decreased energy 0    Change in appetite 0    Feeling bad or failure about yourself  0    Trouble concentrating 0    Moving slowly or fidgety/restless 0    Suicidal thoughts 0    PHQ-9 Score 0    Difficult doing work/chores Not difficult at all      Physical Exam Vitals reviewed.  Constitutional:      Appearance: Normal appearance. She is well-developed.  Eyes:     Conjunctiva/sclera: Conjunctivae normal.  Neck:     Thyroid: No thyroid mass or thyromegaly.  Cardiovascular:     Rate and Rhythm: Normal rate and regular rhythm.     Pulses: Normal pulses.     Heart sounds: Normal heart sounds.  Pulmonary:     Effort: Pulmonary effort is normal.     Breath sounds: Normal breath sounds. No wheezing, rhonchi or rales.  Chest:  Breasts:    Breasts are symmetrical.     Right: No inverted nipple, mass, nipple discharge, skin change or tenderness.     Left: No inverted nipple, mass, nipple discharge, skin change or tenderness.  Abdominal:     General: Bowel sounds are normal. There is no distension.     Palpations: Abdomen is soft. Abdomen is not rigid. There is no fluid  wave or mass.     Tenderness: There is no abdominal tenderness. There is no guarding or rebound.  Genitourinary:    Cervix: No cervical motion tenderness, discharge or friability.     Uterus: Not enlarged, not fixed and not tender.      Adnexa:        Right: No mass, tenderness or fullness.         Left: No mass, tenderness or fullness.       Comments: Pap performed. No CMT. Unable to appreciated ovaries. Musculoskeletal:     Comments: Bilateral palpable radial pulses.  Grip strength normal.  Negative phalen and tinel sign.   Lymphadenopathy:     Head:     Right side of head: No submental, submandibular, tonsillar, preauricular, posterior auricular or occipital adenopathy.     Left side of head: No submental, submandibular, tonsillar, preauricular, posterior auricular or occipital adenopathy.     Cervical:     Right cervical: No superficial, deep or posterior cervical adenopathy.    Left cervical: No superficial, deep or posterior cervical adenopathy.     Upper Body:     Right upper body: No pectoral adenopathy.     Left upper body: No pectoral adenopathy.  Skin:    General: Skin is warm and dry.  Neurological:     Mental Status: She is alert.  Psychiatric:        Speech: Speech normal.        Behavior: Behavior normal.        Thought Content: Thought content normal.

## 2022-03-20 NOTE — Assessment & Plan Note (Addendum)
New, uncontrolled. 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  Trial Wellbutrin.  Counseled on risk of seizure with alcohol and Wellbutrin.  Patient understands to have no more than 1 alcoholic beverage while on Wellbutrin.  Close follow-up

## 2022-03-20 NOTE — Patient Instructions (Addendum)
Trial of gabapentin for peripheral neuropathy.    Start wellbutrin 150mg    We discussed today starting medication called Wellbutrin.  As also discussed, you must limit alcohol on this medication as alcohol and Wellbutrin together may increase your risk for seizure.  You may drink no more than 1 alcoholic beverage on this medication.  A standard drink is 12 ounces of regular beer, which is usually about 5% alcohol OR 5 ounces of wine, which is typically about 12% alcohol OR   1.5 ounces of distilled spirits, which is about 40% alcohol  Health Maintenance for Postmenopausal Women Menopause is a normal process in which your ability to get pregnant comes to an end. This process happens slowly over many months or years, usually between the ages of 55 and 7. Menopause is complete when you have missed your menstrual period for 12 months. It is important to talk with your health care provider about some of the most common conditions that affect women after menopause (postmenopausal women). These include heart disease, cancer, and bone loss (osteoporosis). Adopting a healthy lifestyle and getting preventive care can help to promote your health and wellness. The actions you take can also lower your chances of developing some of these common conditions. What are the signs and symptoms of menopause? During menopause, you may have the following symptoms: Hot flashes. These can be moderate or severe. Night sweats. Decrease in sex drive. Mood swings. Headaches. Tiredness (fatigue). Irritability. Memory problems. Problems falling asleep or staying asleep. Talk with your health care provider about treatment options for your symptoms. Do I need hormone replacement therapy? Hormone replacement therapy is effective in treating symptoms that are caused by menopause, such as hot flashes and night sweats. Hormone replacement carries certain risks, especially as you become older. If you are thinking about using  estrogen or estrogen with progestin, discuss the benefits and risks with your health care provider. How can I reduce my risk for heart disease and stroke? The risk of heart disease, heart attack, and stroke increases as you age. One of the causes may be a change in the body's hormones during menopause. This can affect how your body uses dietary fats, triglycerides, and cholesterol. Heart attack and stroke are medical emergencies. There are many things that you can do to help prevent heart disease and stroke. Watch your blood pressure High blood pressure causes heart disease and increases the risk of stroke. This is more likely to develop in people who have high blood pressure readings or are overweight. Have your blood pressure checked: Every 3-5 years if you are 65-40 years of age. Every year if you are 67 years old or older. Eat a healthy diet  Eat a diet that includes plenty of vegetables, fruits, low-fat dairy products, and lean protein. Do not eat a lot of foods that are high in solid fats, added sugars, or sodium. Get regular exercise Get regular exercise. This is one of the most important things you can do for your health. Most adults should: Try to exercise for at least 150 minutes each week. The exercise should increase your heart rate and make you sweat (moderate-intensity exercise). Try to do strengthening exercises at least twice each week. Do these in addition to the moderate-intensity exercise. Spend less time sitting. Even light physical activity can be beneficial. Other tips Work with your health care provider to achieve or maintain a healthy weight. Do not use any products that contain nicotine or tobacco. These products include cigarettes, chewing tobacco,  and vaping devices, such as e-cigarettes. If you need help quitting, ask your health care provider. Know your numbers. Ask your health care provider to check your cholesterol and your blood sugar (glucose). Continue to have  your blood tested as directed by your health care provider. Do I need screening for cancer? Depending on your health history and family history, you may need to have cancer screenings at different stages of your life. This may include screening for: Breast cancer. Cervical cancer. Lung cancer. Colorectal cancer. What is my risk for osteoporosis? After menopause, you may be at increased risk for osteoporosis. Osteoporosis is a condition in which bone destruction happens more quickly than new bone creation. To help prevent osteoporosis or the bone fractures that can happen because of osteoporosis, you may take the following actions: If you are 68-62 years old, get at least 1,000 mg of calcium and at least 600 international units (IU) of vitamin D per day. If you are older than age 23 but younger than age 44, get at least 1,200 mg of calcium and at least 600 international units (IU) of vitamin D per day. If you are older than age 72, get at least 1,200 mg of calcium and at least 800 international units (IU) of vitamin D per day. Smoking and drinking excessive alcohol increase the risk of osteoporosis. Eat foods that are rich in calcium and vitamin D, and do weight-bearing exercises several times each week as directed by your health care provider. How does menopause affect my mental health? Depression may occur at any age, but it is more common as you become older. Common symptoms of depression include: Feeling depressed. Changes in sleep patterns. Changes in appetite or eating patterns. Feeling an overall lack of motivation or enjoyment of activities that you previously enjoyed. Frequent crying spells. Talk with your health care provider if you think that you are experiencing any of these symptoms. General instructions See your health care provider for regular wellness exams and vaccines. This may include: Scheduling regular health, dental, and eye exams. Getting and maintaining your vaccines.  These include: Influenza vaccine. Get this vaccine each year before the flu season begins. Pneumonia vaccine. Shingles vaccine. Tetanus, diphtheria, and pertussis (Tdap) booster vaccine. Your health care provider may also recommend other immunizations. Tell your health care provider if you have ever been abused or do not feel safe at home. Summary Menopause is a normal process in which your ability to get pregnant comes to an end. This condition causes hot flashes, night sweats, decreased interest in sex, mood swings, headaches, or lack of sleep. Treatment for this condition may include hormone replacement therapy. Take actions to keep yourself healthy, including exercising regularly, eating a healthy diet, watching your weight, and checking your blood pressure and blood sugar levels. Get screened for cancer and depression. Make sure that you are up to date with all your vaccines. This information is not intended to replace advice given to you by your health care provider. Make sure you discuss any questions you have with your health care provider. Document Revised: 07/29/2020 Document Reviewed: 07/29/2020 Elsevier Patient Education  [employer].

## 2022-03-20 NOTE — Addendum Note (Signed)
Addended by: Alida Greiner on: 03/20/2022 03:21 PM   Modules accepted: Orders  

## 2022-03-21 LAB — URINE CULTURE
MICRO NUMBER:: 14370416
Result:: NO GROWTH
SPECIMEN QUALITY:: ADEQUATE

## 2022-03-21 LAB — B12 AND FOLATE PANEL
Folate: 15.6 ng/mL
Vitamin B-12: 436 pg/mL (ref 200–1100)

## 2022-03-25 LAB — CYTOLOGY - PAP
Comment: NEGATIVE
Diagnosis: NEGATIVE
High risk HPV: NEGATIVE

## 2022-03-27 ENCOUNTER — Other Ambulatory Visit: Payer: Self-pay | Admitting: Family

## 2022-03-27 DIAGNOSIS — A599 Trichomoniasis, unspecified: Secondary | ICD-10-CM

## 2022-03-27 MED ORDER — METRONIDAZOLE 500 MG PO TABS: 500.0000 mg | ORAL_TABLET | Freq: Two times a day (BID) | ORAL | 0 refills | Status: AC

## 2022-04-07 ENCOUNTER — Encounter: Payer: Self-pay | Admitting: Family

## 2022-04-08 ENCOUNTER — Telehealth (INDEPENDENT_AMBULATORY_CARE_PROVIDER_SITE_OTHER): Payer: BC Managed Care – PPO | Admitting: Family

## 2022-04-08 DIAGNOSIS — U071 COVID-19: Secondary | ICD-10-CM

## 2022-04-08 NOTE — Assessment & Plan Note (Signed)
Fortunately symptoms are mild.  Duration 4 days.  We discussed the benefits of starting molnupiravir.  Patient ultimately decided that she did not want to treat symptoms at this time.  She feels comfortable using Tessalon Perles and Flonase at home.  She will let me know how she is doing.

## 2022-04-08 NOTE — Telephone Encounter (Signed)
Scheduled

## 2022-04-08 NOTE — Patient Instructions (Signed)
You may start PLAIN Mucinex ( guaifenesin) which you can help break up thick congestion.  Please ensure you are drinking plenty of water with this medication.  Please stay in quarantine per cdc guidelines.   If you test positive for COVID-19, stay home for at least 5 days and isolate from others in your home. You are likely most infectious during these first 5 days. Wear a high-quality mask if you must be around others at home and in public. Do not go places where you are unable to wear a mask.  Please let me know how you are doing

## 2022-04-08 NOTE — Progress Notes (Signed)
Virtual Visit via Video Note  I connected with Julia Brown on 04/08/22 at 12:15 PM EST by a video enabled telemedicine application and verified that I am speaking with the correct person using two identifiers. Location patient: home Location provider: work  Persons participating in the virtual visit: patient, provider  I discussed the limitations of evaluation and management by telemedicine and the availability of in person appointments. The patient expressed understanding and agreed to proceed.  HPI: Complains of sore throat, watery eyes x 4 days ago.  Cough started yesterday/ Symptoms started x 4 days ago. Cough is worse at night. Endorses nasal congestion, sinus pressure.  No fever, SOB, myalgia, wheezing.  Covid positive as of yesterday She is using tessalon perles, flonase.  H/o osa, smoking  She has good appetite. She hasn't checked blood glucose.    ROS: See pertinent positives and negatives per HPI.  EXAM:  VITALS per patient if applicable: LMP  (LMP Unknown)  BP Readings from Last 3 Encounters:  03/20/22 136/76  01/30/22 130/80  10/10/21 134/68   Wt Readings from Last 3 Encounters:  03/20/22 245 lb 3.2 oz (111.2 kg)  01/30/22 239 lb (108.4 kg)  10/10/21 239 lb 12.8 oz (108.8 kg)    GENERAL: alert, oriented, appears well and in no acute distress  HEENT: atraumatic, conjunttiva clear, no obvious abnormalities on inspection of external nose and ears  NECK: normal movements of the head and neck  LUNGS: on inspection no signs of respiratory distress, breathing rate appears normal, no obvious gross SOB, gasping or wheezing  CV: no obvious cyanosis  MS: moves all visible extremities without noticeable abnormality  PSYCH/NEURO: pleasant and cooperative, no obvious depression or anxiety, speech and thought processing grossly intact  ASSESSMENT AND PLAN: COVID-19 Assessment & Plan: Fortunately symptoms are mild.  Duration 4 days.  We discussed the benefits of  starting molnupiravir.  Patient ultimately decided that she did not want to treat symptoms at this time.  She feels comfortable using Tessalon Perles and Flonase at home.  She will let me know how she is doing.        -we discussed possible serious and likely etiologies, options for evaluation and workup, limitations of telemedicine visit vs in person visit, treatment, treatment risks and precautions. Pt prefers to treat via telemedicine empirically rather then risking or undertaking an in person visit at this moment.    I discussed the assessment and treatment plan with the patient. The patient was provided an opportunity to ask questions and all were answered. The patient agreed with the plan and demonstrated an understanding of the instructions.   The patient was advised to call back or seek an in-person evaluation if the symptoms worsen or if the condition fails to improve as anticipated.  Advised if desired AVS can be mailed or viewed via [facility] if [facility] user.   [name], FNP

## 2022-04-09 ENCOUNTER — Other Ambulatory Visit: Payer: Self-pay | Admitting: Family

## 2022-04-09 ENCOUNTER — Encounter: Payer: Self-pay | Admitting: Family

## 2022-04-09 DIAGNOSIS — U071 COVID-19: Secondary | ICD-10-CM

## 2022-04-09 MED ORDER — GUAIFENESIN-CODEINE 100-10 MG/5ML PO SYRP: 5.0000 mL | ORAL_SOLUTION | Freq: Every evening | ORAL | 0 refills | Status: DC | PRN

## 2022-04-13 ENCOUNTER — Other Ambulatory Visit: Payer: Self-pay | Admitting: Family

## 2022-04-13 ENCOUNTER — Encounter: Payer: Self-pay | Admitting: Family

## 2022-04-13 DIAGNOSIS — E119 Type 2 diabetes mellitus without complications: Secondary | ICD-10-CM

## 2022-04-13 DIAGNOSIS — E1110 Type 2 diabetes mellitus with ketoacidosis without coma: Secondary | ICD-10-CM

## 2022-04-13 DIAGNOSIS — G6289 Other specified polyneuropathies: Secondary | ICD-10-CM

## 2022-04-13 MED ORDER — PIOGLITAZONE HCL 15 MG PO TABS: 30.0000 mg | ORAL_TABLET | Freq: Every day | ORAL | 3 refills | Status: DC

## 2022-04-14 ENCOUNTER — Other Ambulatory Visit: Payer: Self-pay

## 2022-04-14 ENCOUNTER — Encounter: Payer: Self-pay | Admitting: Family

## 2022-04-14 NOTE — Telephone Encounter (Signed)
Spoke to pt and she stated that she would send in Blood sugar readings and also increase the Actos from 15 mg to 30 mg. Mo signs of infection at all. Freestyle Libre monitor sent in to pharmacy as well.

## 2022-04-14 NOTE — Progress Notes (Signed)
error 

## 2022-04-15 ENCOUNTER — Encounter: Payer: Self-pay | Admitting: Family

## 2022-04-15 ENCOUNTER — Other Ambulatory Visit: Payer: Self-pay | Admitting: Family

## 2022-04-15 ENCOUNTER — Other Ambulatory Visit: Payer: Self-pay

## 2022-04-15 MED ORDER — RYBELSUS 3 MG PO TABS: 3.0000 mg | ORAL_TABLET | Freq: Every day | ORAL | 0 refills | Status: DC

## 2022-04-15 NOTE — Telephone Encounter (Signed)
Printed and placed on your desk. 

## 2022-04-17 ENCOUNTER — Other Ambulatory Visit: Payer: Self-pay

## 2022-04-17 MED ORDER — DEXCOM G7 SENSOR MISC: 1 refills | Status: DC

## 2022-04-17 NOTE — Telephone Encounter (Signed)
Spoke to pt and informed her that Dexcom sensor was sent in and also informed her not to begin taking the Rybelsus due to the fact that she is taking the basal insulin and the actos. Pt verbalized understanding

## 2022-04-20 ENCOUNTER — Encounter: Payer: Self-pay | Admitting: Family

## 2022-04-20 NOTE — Telephone Encounter (Signed)
LVM to call back to office  

## 2022-04-22 NOTE — Telephone Encounter (Signed)
Called and spoke with pt  Discussed hypoglycemic episodes 3 days ago, since resolved.  She had increased to 55 lantus. She is now lantus 50 units. She started actos 30mg  5 days ago. FBG  Discussed CGM report. She is sitting down to eat. Blood sugar right now 69 and CGM alarm went off.   FBG today 118.    She hasn't yet started rybelus.   Plan:   If blood sugars approach < 80 decrease lantus to 45 units daily. Decrease to 40 units if she has another hypoglycemic episode.  Continue metformin.  HOLD rybelsus for now and we discuss next month .

## 2022-04-29 LAB — HEMOGLOBIN A1C: Hemoglobin A1C: 8.5

## 2022-04-30 ENCOUNTER — Other Ambulatory Visit (HOSPITAL_COMMUNITY): Payer: Self-pay

## 2022-05-01 ENCOUNTER — Telehealth: Payer: Self-pay | Admitting: Family

## 2022-05-01 ENCOUNTER — Telehealth: Payer: Self-pay

## 2022-05-01 ENCOUNTER — Ambulatory Visit: Payer: BC Managed Care – PPO | Admitting: Family

## 2022-05-01 ENCOUNTER — Other Ambulatory Visit (HOSPITAL_COMMUNITY): Payer: Self-pay

## 2022-05-01 NOTE — Telephone Encounter (Signed)
Prescription Request  05/01/2022  Is this a "Controlled Substance" medicine? Yes  LOV: 03/20/2022  What is the name of the medication or equipment? Continuous Blood Gluc Sensor ([place]) MISC, Pharmacy says this needs a PA  Have you contacted your pharmacy to request a refill? Yes   Which pharmacy would you like this sent to?  [facility] [place], [place] [id number] [place] [address] Phone: [phone] Fax: [phone]     Patient notified that their request is being sent to the clinical staff for review and that they should receive a response within 2 business days.   Please advise at Mobile [phone] (mobile)

## 2022-05-01 NOTE — Telephone Encounter (Signed)
Pharmacy Patient Advocate Encounter   Received notification that prior authorization for Dexcom G7 Sensor is required/requested.  Per Test Claim: Prior authorization needed   PA submitted on 05/01/22 to ([employer]) [employer] via CoverMyMeds Key [id number] Status is pending

## 2022-05-01 NOTE — Telephone Encounter (Signed)
LVM for pt to call back to see if  she would like to do virtual appt today instead of in person

## 2022-05-01 NOTE — Progress Notes (Deleted)
Assessment & Plan:  There are no diagnoses linked to this encounter.   Return precautions given.   Risks, benefits, and alternatives of the medications and treatment plan prescribed today were discussed, and patient expressed understanding.   Education regarding symptom management and diagnosis given to patient on AVS either electronically or printed.  No follow-ups on file.  [name], FNP  Subjective:    Patient ID: Julia Brown, female    DOB: 10-Jul-1970, 52 y.o.   MRN: [id number]  CC: Julia Brown is a 52 y.o. female who presents today for follow up.   HPI: DM follow up  Compliant with lantus to 45 units daily.  Continue metformin 2036m bid, actos 164mqd.  She has not started rybelsus.    HOLD rybelsus for now and we discuss next month .    Time CGM active to 60%, average glucose 151, glucose management indicator 6.9%, glucose variability 34.1%   2% 1 in low range and 4% in very high range.  hypoglycemic episodes documented on CGM are on the days of January 28 January 29, early in the morning.  She had hypoglycemic event at 2 PM January 31 and at 12 PM February 1.  Again she had hypoglycemic episode February 2 at 2 AM February 4 at 4 AM     Allergies: Contrast media [iodinated contrast media], Statins, Jardiance [empagliflozin], Ozempic (0.25 or 0.5 mg-dose) [semaglutide(0.25 or 0.75m60mos)], Bactrim [sulfamethoxazole-trimethoprim], Penicillins, and Trulicity [dulaglutide] Current Outpatient Medications on File Prior to Visit  Medication Sig Dispense Refill   amLODipine (NORVASC) 5 MG tablet Take 1 tablet (5 mg total) by mouth daily. 30 tablet 5   buPROPion (WELLBUTRIN XL) 150 MG 24 hr tablet Take 1 tablet (150 mg total) by mouth daily. 90 tablet 1   carvedilol (COREG) 3.125 MG tablet Take 1 tablet (3.125 mg total) by mouth 2 (two) times a day with meals. 180 tablet 6   Continuous Blood Gluc Receiver (FREESTYLE LIBRE 2 READER) DEVI 1 Device by Does not  apply route every 14 (fourteen) days. 2 each 0   Continuous Blood Gluc Sensor (DEXCOM G7 SENSOR) MISC Used to monitor pt sugars 2 each 1   guaiFENesin-codeine (ROBITUSSIN AC) 100-10 MG/5ML syrup Take 5 mLs by mouth at bedtime as needed for cough. 70 mL 0   Insulin Glargine (BASAGLAR KWIKPEN) 100 UNIT/ML Inject 0.52 mL (52 Units total) under the skin every morning. 15 mL 3   Insulin Pen Needle (B-D UF III MINI PEN NEEDLES) 31G X 5 MM MISC Use as directed. 100 each 11   losartan-hydrochlorothiazide (HYZAAR) 100-25 MG tablet Take 1 tablet by mouth daily. 90 tablet 4   metFORMIN (GLUCOPHAGE-XR) 500 MG 24 hr tablet Take 2 tablets (1,000 mg total) by mouth in the morning and at bedtime. 120 tablet 3   Multiple Vitamins-Minerals (MULTIVITAMIN WITH MINERALS) tablet Take 1 tablet by mouth daily.     pantoprazole (PROTONIX) 40 MG tablet Take 1 tablet (40 mg total) by mouth daily. 90 tablet 1   pioglitazone (ACTOS) 15 MG tablet Take 2 tablets (30 mg total) by mouth daily. 180 tablet 3   rosuvastatin (CRESTOR) 20 MG tablet Take 1 tablet (20 mg total) by mouth daily. 90 tablet 3   scopolamine (TRANSDERM-SCOP) 1 MG/3DAYS Place 1 patch (1.5 mg total) onto the skin every 3 (three) days. 10 patch 0   Semaglutide (RYBELSUS) 3 MG TABS Take 1 tablet (3 mg total) by mouth daily. 30 tablet 0   No  current facility-administered medications on file prior to visit.    Review of Systems    Objective:    There were no vitals taken for this visit. BP Readings from Last 3 Encounters:  03/20/22 136/76  01/30/22 130/80  10/10/21 134/68   Wt Readings from Last 3 Encounters:  03/20/22 245 lb 3.2 oz (111.2 kg)  01/30/22 239 lb (108.4 kg)  10/10/21 239 lb 12.8 oz (108.8 kg)    Physical Exam

## 2022-05-04 ENCOUNTER — Other Ambulatory Visit (HOSPITAL_COMMUNITY): Payer: Self-pay

## 2022-05-04 NOTE — Telephone Encounter (Signed)
Received a fax regarding Prior Authorization from [employer] for Dexcom G7 Sensor. Authorization has been DENIED because your plan only covers this product if you will be using it with an intensive insulin regimen. We have denied your request because: A) you are not (or will not be taking it and B) you do not have a medical reason not to take it.  Phone# [phone]  Denial letter is attached to chart.

## 2022-05-04 NOTE — Telephone Encounter (Signed)
Pharmacy Patient Advocate Encounter  Received notification from [employer] that the request for prior authorization for Dexcom G7 Sensor has been denied due to:      Please be advised we currently do not have a Pharmacist to review denials, therefore you will need to process appeals accordingly as needed. Thanks for your support at this time.   You may call [phone] or fax [phone], to appeal.

## 2022-05-06 NOTE — Telephone Encounter (Signed)
LVM for pt to call back to office

## 2022-05-06 NOTE — Telephone Encounter (Signed)
PA denied because patient does not have an intensive insulin regimen. Per insurance an intensive insulin regimen is defined as multiple daily injections of insulin (3 or more injections per day or insulin pump therapy).

## 2022-05-08 NOTE — Telephone Encounter (Signed)
Spoke to pt and she stated that she did not want to appeal she would just go back to the [employer].Pt also stated that she saw her Endocrinologist and they added Mounjaro and decreased the Actos to 15 and the Lantus to 46 units. Scheduled f/up appt with you for 05/29/22

## 2022-05-13 NOTE — Telephone Encounter (Signed)
noted 

## 2022-05-28 ENCOUNTER — Other Ambulatory Visit: Payer: Self-pay | Admitting: Family

## 2022-05-28 ENCOUNTER — Other Ambulatory Visit: Payer: Self-pay

## 2022-05-28 DIAGNOSIS — E119 Type 2 diabetes mellitus without complications: Secondary | ICD-10-CM

## 2022-05-29 ENCOUNTER — Encounter: Payer: Self-pay | Admitting: Family

## 2022-05-29 ENCOUNTER — Ambulatory Visit: Payer: BC Managed Care – PPO | Admitting: Family

## 2022-05-29 VITALS — BP 136/82 | HR 100 | Temp 98.0°F | Ht 70.0 in | Wt 245.6 lb

## 2022-05-29 DIAGNOSIS — I10 Essential (primary) hypertension: Secondary | ICD-10-CM | POA: Diagnosis not present

## 2022-05-29 DIAGNOSIS — E1110 Type 2 diabetes mellitus with ketoacidosis without coma: Secondary | ICD-10-CM | POA: Diagnosis not present

## 2022-05-29 DIAGNOSIS — R55 Syncope and collapse: Secondary | ICD-10-CM | POA: Insufficient documentation

## 2022-05-29 MED ORDER — METFORMIN HCL ER 500 MG PO TB24: ORAL_TABLET | ORAL | 3 refills | Status: DC

## 2022-05-29 NOTE — Assessment & Plan Note (Addendum)
Question of vasovagal syncope while laughing.  No recurrence of episode.  Ordered Zio monitor to look for underlying arrhythmia.  Patient will stay vigilant and let me know if any recurrence of symptoms.

## 2022-05-29 NOTE — Progress Notes (Signed)
Assessment & Plan:  Syncope, unspecified syncope type Assessment & Plan: Question of vasovagal syncope while laughing.  No recurrence of episode.  Ordered Zio monitor to look for underlying arrhythmia.  Patient will stay vigilant and let me know if any recurrence of symptoms.  Orders: -     LONG TERM MONITOR (3-14 DAYS); Future  Type 2 diabetes mellitus with ketoacidosis without coma, without long-term current use of insulin (Tolu) Assessment & Plan: She is now established with endocrinology.  Will follow.   Orders: -     metFORMIN HCl ER; '1000mg'$  qam and '500mg'$  pm.  Dispense: 120 tablet; Refill: 3 -     Microalbumin / creatinine urine ratio  Essential hypertension Assessment & Plan: Chronic, stable. Continue amlodipine 5 mg, carvedilol 3.125 mg twice daily, losartan-hydrochlorothiazide 100-25 mg.       Return precautions given.   Risks, benefits, and alternatives of the medications and treatment plan prescribed today were discussed, and patient expressed understanding.   Education regarding symptom management and diagnosis given to patient on AVS either electronically or printed.  Return in about 2 months (around 07/29/2022).  Julia Paris, FNP  Subjective:    Patient ID: Julia Brown, female    DOB: 1970/05/01, 52 y.o.   MRN: [id number]  CC: Julia Brown is a 52 y.o. female who presents today for follow up.   HPI: She describes an episode 6 weeks ago when she was sitting in bed sitting upright watching a  video that was funny.  She started laughing and a few seconds later she felt dizzy that she laid back.  Her husband found her in the bed after syncopal episode.  No head injury .  no urinary incontinence or confusion after episode.  Afterward she felt fine.  She has had no recurrence of syncopal episode.  Denies dizziness, palpitations, chest pain, shortness of breath. She walks a lot for work without shortness of breath or dizziness.  She feels well Mounjaro and  is tolerating well. She has decreased lantus to 36 units.   She is wearing CGM. Compliant with Actos, metformin.  She is following with endocrine , last seen 05/04/22 . started on Mounjaro. Decreased lantus to 46 units . A1c 8.5    Allergies: Contrast media [iodinated contrast media], Statins, Jardiance [empagliflozin], Ozempic (0.25 or 0.5 mg-dose) [semaglutide(0.25 or 0.'5mg'$ -dos)], Bactrim [sulfamethoxazole-trimethoprim], Penicillins, and Trulicity [dulaglutide] Current Outpatient Medications on File Prior to Visit  Medication Sig Dispense Refill   amLODipine (NORVASC) 5 MG tablet Take 1 tablet (5 mg total) by mouth daily. 30 tablet 5   carvedilol (COREG) 3.125 MG tablet Take 1 tablet (3.125 mg total) by mouth 2 (two) times a day with meals. 180 tablet 6   Continuous Blood Gluc Receiver (FREESTYLE LIBRE 2 READER) DEVI 1 Device by Does not apply route every 14 (fourteen) days. 2 each 0   Insulin Pen Needle (B-D UF III MINI PEN NEEDLES) 31G X 5 MM MISC Use as directed. 100 each 11   LANTUS SOLOSTAR 100 UNIT/ML Solostar Pen Inject 0.52 mL (52 Units total) under the skin every morning. 15 mL 3   losartan-hydrochlorothiazide (HYZAAR) 100-25 MG tablet Take 1 tablet by mouth daily. 90 tablet 4   Multiple Vitamins-Minerals (MULTIVITAMIN WITH MINERALS) tablet Take 1 tablet by mouth daily.     pantoprazole (PROTONIX) 40 MG tablet Take 1 tablet (40 mg total) by mouth daily. 90 tablet 1   pioglitazone (ACTOS) 15 MG tablet Take 2 tablets (30 mg total)  by mouth daily. 180 tablet 3   rosuvastatin (CRESTOR) 20 MG tablet Take 1 tablet (20 mg total) by mouth daily. 90 tablet 3   scopolamine (TRANSDERM-SCOP) 1 MG/3DAYS Place 1 patch (1.5 mg total) onto the skin every 3 (three) days. 10 patch 0   No current facility-administered medications on file prior to visit.    Review of Systems  Constitutional:  Negative for chills and fever.  Respiratory:  Negative for cough.   Cardiovascular:  Negative for chest  pain and palpitations.  Gastrointestinal:  Negative for nausea and vomiting.  Neurological:  Positive for dizziness and syncope.      Objective:    BP 136/82   Pulse 100   Temp 98 F (36.7 C) (Oral)   Ht '5\' 10"'$  (1.778 m)   Wt 245 lb 9.6 oz (111.4 kg)   LMP  (LMP Unknown)   SpO2 97%   BMI 35.24 kg/m  BP Readings from Last 3 Encounters:  05/29/22 136/82  03/20/22 136/76  01/30/22 130/80   Wt Readings from Last 3 Encounters:  05/29/22 245 lb 9.6 oz (111.4 kg)  03/20/22 245 lb 3.2 oz (111.2 kg)  01/30/22 239 lb (108.4 kg)    Physical Exam Vitals reviewed.  Constitutional:      Appearance: She is well-developed.  Eyes:     Conjunctiva/sclera: Conjunctivae normal.  Cardiovascular:     Rate and Rhythm: Normal rate and regular rhythm.     Pulses: Normal pulses.     Heart sounds: Normal heart sounds.  Pulmonary:     Effort: Pulmonary effort is normal.     Breath sounds: Normal breath sounds. No wheezing, rhonchi or rales.  Skin:    General: Skin is warm and dry.  Neurological:     Mental Status: She is alert.  Psychiatric:        Speech: Speech normal.        Behavior: Behavior normal.        Thought Content: Thought content normal.

## 2022-05-29 NOTE — Patient Instructions (Signed)
I have ordered a 14 day Zio monitor which will mailed directly to you. The device will include instructions on how to apply.  Please let me know if you were to have dizziness or another episode of passing out right away.    The Zio 14 day monitor that we use DOES NOT have 24 hour live monitoring. The rhythm of your heart will not be monitored while you are wearing it. Only AFTER you mail the device back in and a cardiologist interprets the data will we know if an underlying cardiac arrhythmia is going on.   There are models that offer 24 hour live monitoring however NOT this one. I wanted to be sure that you were aware of this as certainly didn't want this to be a false sense of security.   If you experience chest pain, shortness of breath, left arm numbness, or sustained, more frequent palpitations , do not wait and call 911 right away. We are in a delicate period of work up regarding palpitations and until we are sure that you do not have an underlying arrhythmia, you must be extremely vigilant and cautious.

## 2022-05-29 NOTE — Addendum Note (Signed)
Addended by: Leeanne Rio on: 05/29/2022 05:11 PM   Modules accepted: Orders

## 2022-05-29 NOTE — Assessment & Plan Note (Signed)
Chronic, stable. Continue amlodipine 5 mg, carvedilol 3.125 mg twice daily, losartan-hydrochlorothiazide 100-25 mg.

## 2022-05-29 NOTE — Assessment & Plan Note (Signed)
She is now established with endocrinology.  Will follow.

## 2022-05-30 LAB — MICROALBUMIN / CREATININE URINE RATIO
Creatinine, Urine: 119 mg/dL (ref 20–275)
Microalb, Ur: <0.2 mg/dL

## 2022-06-16 ENCOUNTER — Encounter: Payer: Self-pay | Admitting: Family

## 2022-06-17 ENCOUNTER — Other Ambulatory Visit: Payer: BC Managed Care – PPO

## 2022-06-17 ENCOUNTER — Telehealth: Payer: Self-pay | Admitting: Family

## 2022-06-17 ENCOUNTER — Other Ambulatory Visit: Payer: Self-pay | Admitting: Family

## 2022-06-17 ENCOUNTER — Ambulatory Visit: Payer: BC Managed Care – PPO | Attending: Family

## 2022-06-17 ENCOUNTER — Ambulatory Visit: Payer: BC Managed Care – PPO

## 2022-06-17 DIAGNOSIS — R002 Palpitations: Secondary | ICD-10-CM

## 2022-06-17 DIAGNOSIS — R55 Syncope and collapse: Secondary | ICD-10-CM

## 2022-06-17 NOTE — Telephone Encounter (Signed)
LVM to call back to schedule lab appt.

## 2022-06-17 NOTE — Telephone Encounter (Signed)
[employer] and pt is on lab schedule for today 06/17/22

## 2022-06-17 NOTE — Addendum Note (Signed)
Addended by: Nanci Pina on: 06/17/2022 09:34 AM   Modules accepted: Orders

## 2022-06-17 NOTE — Telephone Encounter (Signed)
Jenate  Can you download date for [facility] and also sch for labs this afternoon and let pt know. Sch her as late as possible       Spoke with pt Explained to her my error in ordering monitor.  I have reordered to patient should get quickly.  She has not had another syncopal episode  She continues to intermittent palpitations daily,  lasting for 'split second'. 'it doesn't stop me in me in my tracks.' Limiting caffeine.   No associated with cp, dizziness, shortness of breath  She has previously Dr Charlestine Night 11/2020 for fluttering heart.   She will remain on Coreg.  Counseled her extensively on the palpitations and if were to last longer or she would become symptomatic including dizziness syncope;  she would need to call 911 immediately.  She verbalized understanding.  We agreed on follow-up Dr Charlestine Night.  Referral has been place

## 2022-06-17 NOTE — Progress Notes (Signed)
Re- Entered order for Zio.

## 2022-06-17 NOTE — Addendum Note (Signed)
Addended by: Nanci Pina on: 06/17/2022 08:33 AM   Modules accepted: Orders

## 2022-06-22 ENCOUNTER — Encounter: Payer: Self-pay | Admitting: Family

## 2022-06-24 ENCOUNTER — Other Ambulatory Visit: Payer: Self-pay | Admitting: Family

## 2022-06-24 DIAGNOSIS — E119 Type 2 diabetes mellitus without complications: Secondary | ICD-10-CM

## 2022-06-24 DIAGNOSIS — E1110 Type 2 diabetes mellitus with ketoacidosis without coma: Secondary | ICD-10-CM

## 2022-06-30 NOTE — Telephone Encounter (Signed)
LVM to call back to office  

## 2022-07-02 NOTE — Telephone Encounter (Signed)
Spoke to pt and she is still using the Julia Brown because insurance would not cover the new one. Would you like me to just go ahead and send in

## 2022-07-14 ENCOUNTER — Other Ambulatory Visit: Payer: Self-pay

## 2022-07-14 DIAGNOSIS — E1110 Type 2 diabetes mellitus with ketoacidosis without coma: Secondary | ICD-10-CM

## 2022-07-14 MED ORDER — METFORMIN HCL ER 500 MG PO TB24: ORAL_TABLET | ORAL | 3 refills | Status: DC

## 2022-07-19 ENCOUNTER — Other Ambulatory Visit: Payer: Self-pay | Admitting: Family

## 2022-07-19 DIAGNOSIS — E1110 Type 2 diabetes mellitus with ketoacidosis without coma: Secondary | ICD-10-CM

## 2022-07-26 IMAGING — MG MM DIGITAL SCREENING BILAT W/ TOMO AND CAD
6 of 10 series · 6 of 30 positions shown · non-contrast
Comparison: Previous exam(s).

CLINICAL DATA: Screening.

EXAM:
DIGITAL SCREENING BILATERAL MAMMOGRAM WITH TOMOSYNTHESIS AND CAD
TECHNIQUE: Bilateral screening digital craniocaudal and mediolateral oblique
mammograms were obtained. Bilateral screening digital breast
tomosynthesis was performed. The images were evaluated with
computer-aided detection.

[L CV synth-2D]
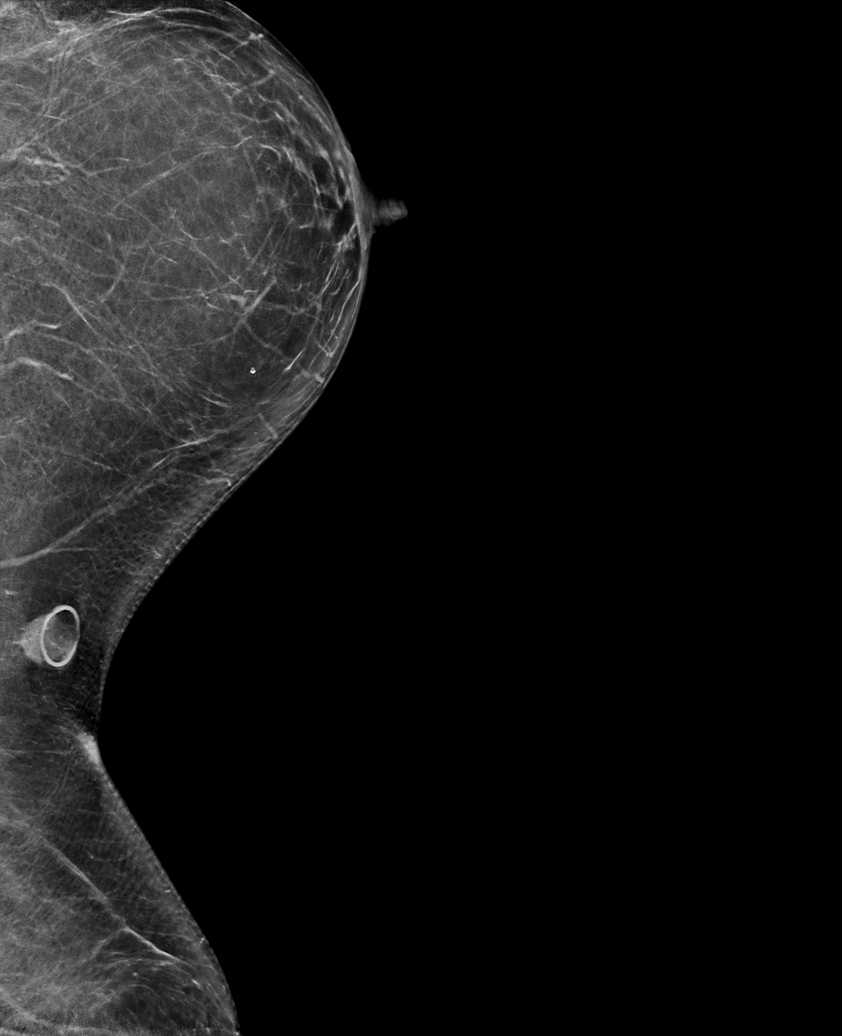

[L MLO synth-2D]
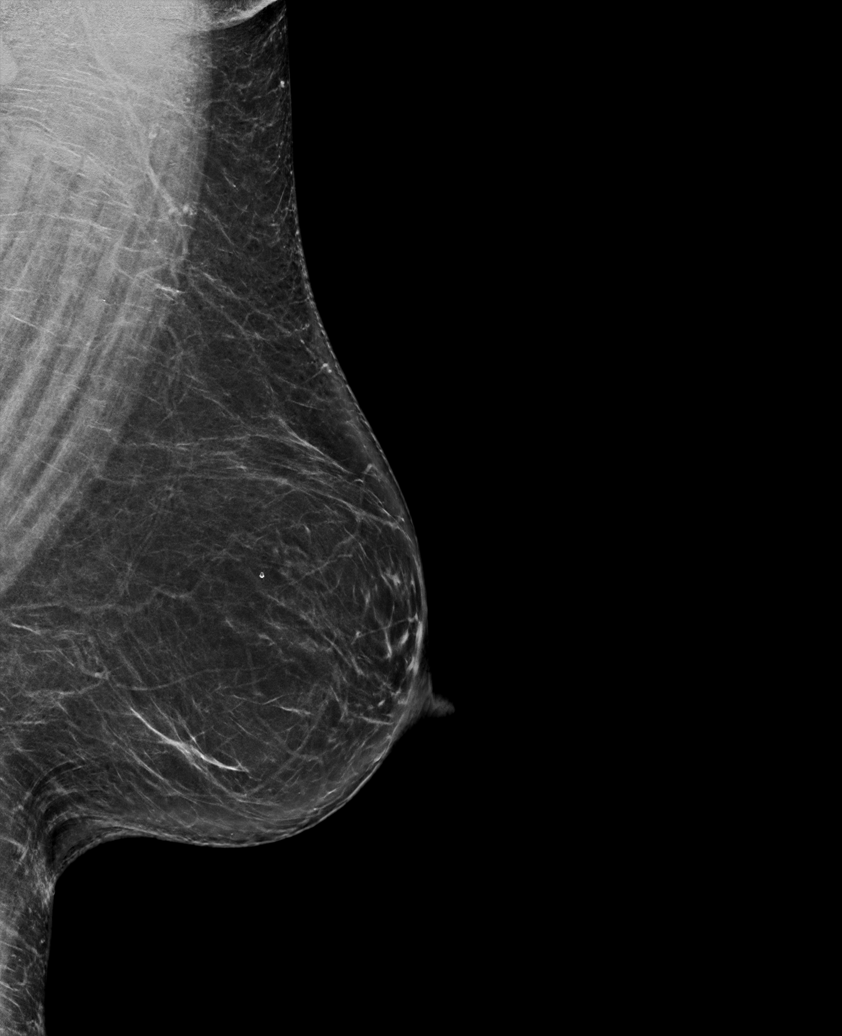

[R CC synth-2D]
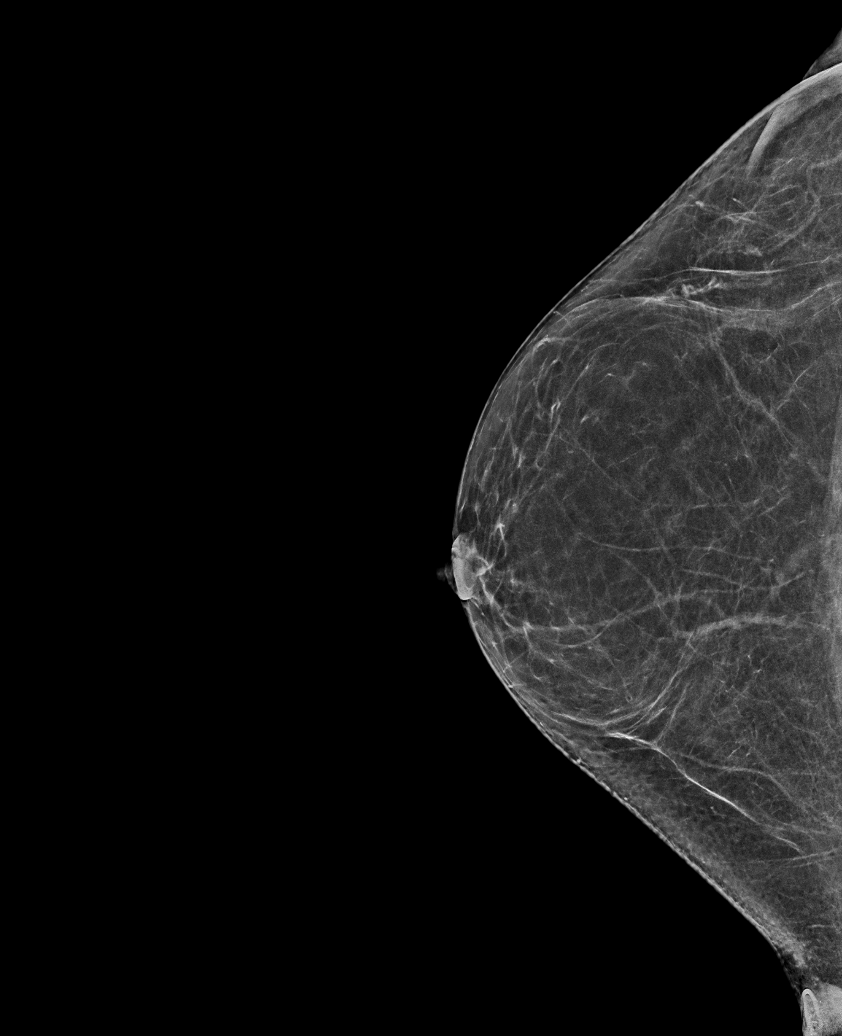

[L CC synth-2D]
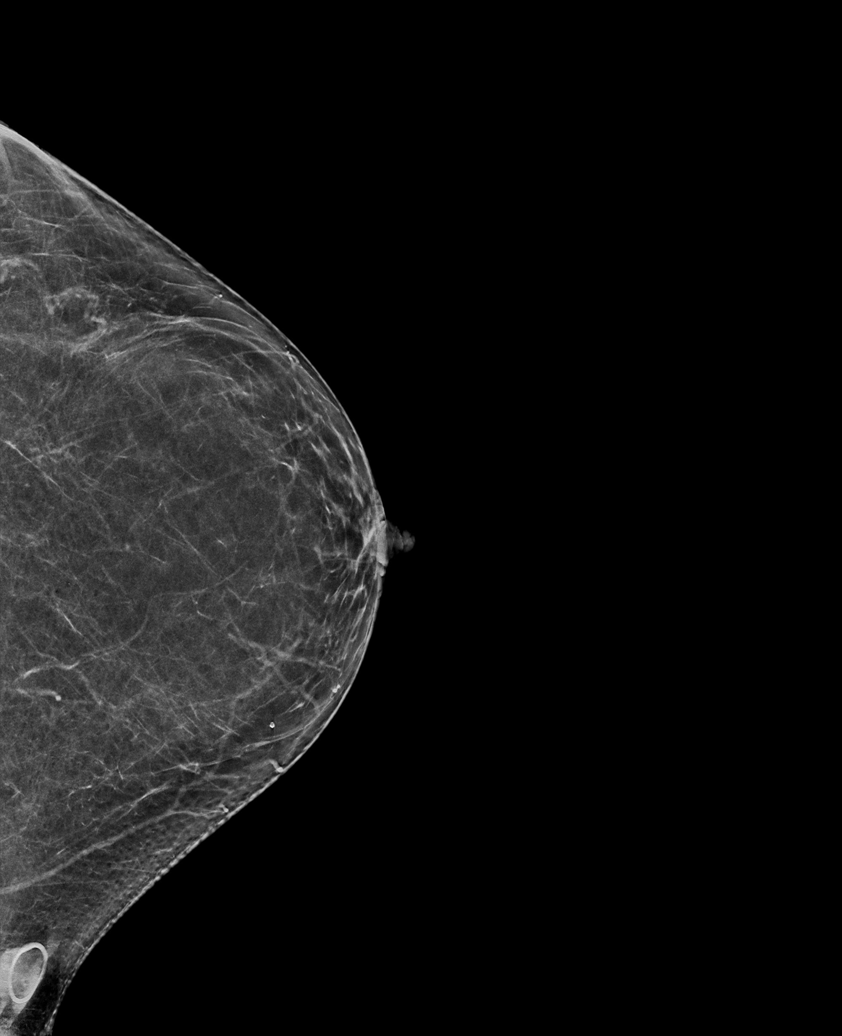

[R MLO synth-2D]
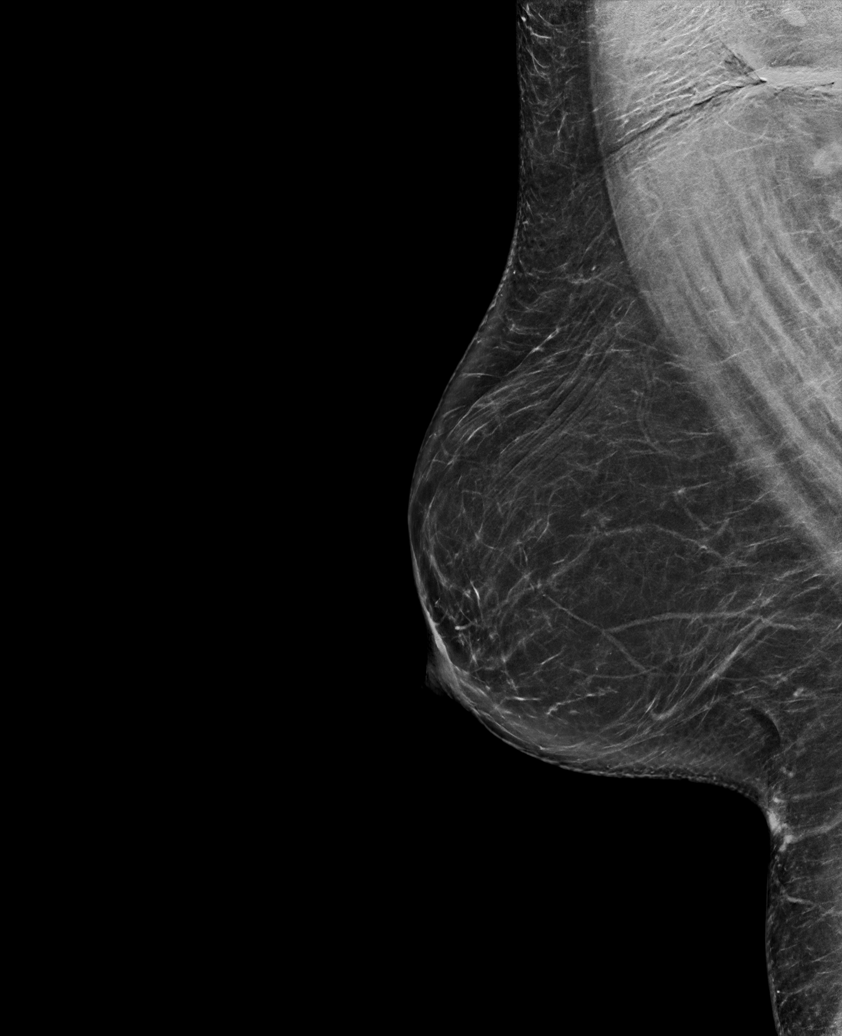

[L MLO tomo · tomo slice 39/78.0]
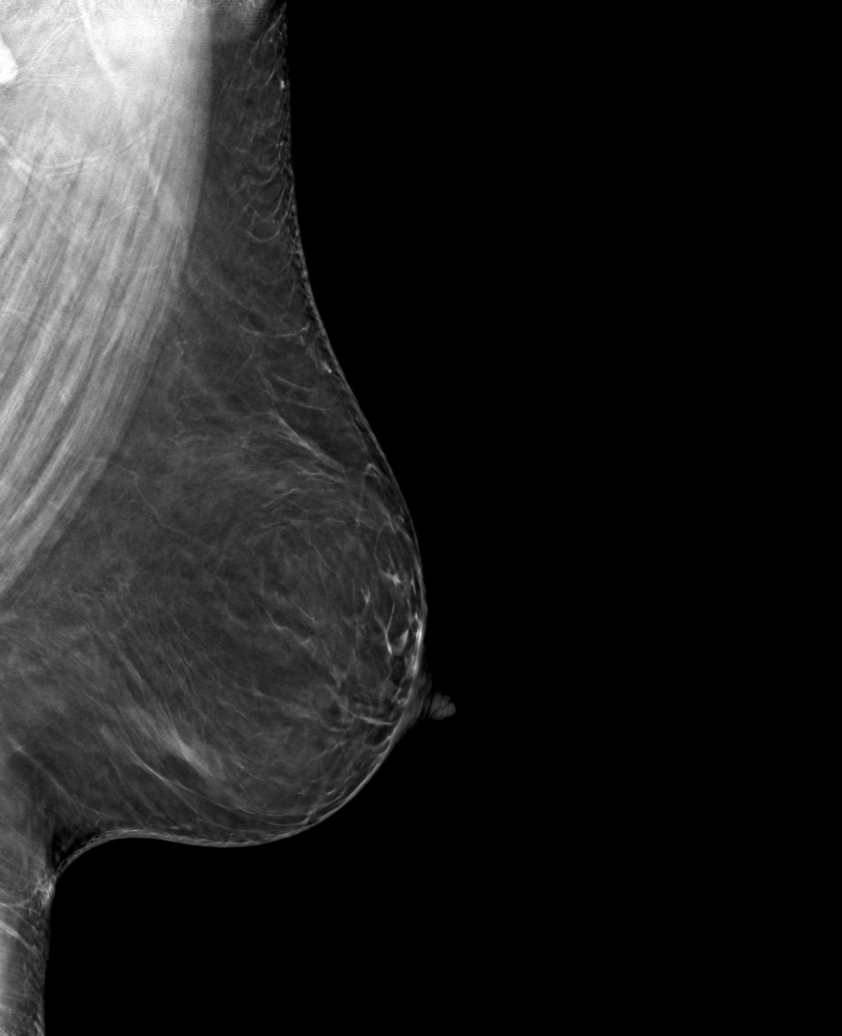

[6 of 30 positions shown; findings below may reference images not displayed]

ACR Breast Density Category b: There are scattered areas of
fibroglandular density.
FINDINGS: There are no findings suspicious for malignancy.
IMPRESSION: No mammographic evidence of malignancy. A result letter of this
screening mammogram will be mailed directly to the patient.

RECOMMENDATION:
Screening mammogram in one year. (Code:51-O-LD2)

BI-RADS CATEGORY  1: Negative.

## 2022-08-07 ENCOUNTER — Ambulatory Visit: Payer: BC Managed Care – PPO | Admitting: Family

## 2022-09-07 ENCOUNTER — Other Ambulatory Visit: Payer: Self-pay | Admitting: Family

## 2022-10-07 ENCOUNTER — Encounter: Payer: Self-pay | Admitting: Family

## 2022-10-07 NOTE — Telephone Encounter (Signed)
 Noted  

## 2022-10-07 NOTE — Telephone Encounter (Signed)
 Patient returned office phone call and appointment was scheduled.

## 2022-10-07 NOTE — Telephone Encounter (Signed)
LVM to call back to see if she would like to see another provider in the office on Friday 7/19 because Julia Brown is going to be out of the office

## 2022-10-09 ENCOUNTER — Ambulatory Visit: Payer: BC Managed Care – PPO | Admitting: Nurse Practitioner

## 2022-10-09 ENCOUNTER — Encounter: Payer: Self-pay | Admitting: Nurse Practitioner

## 2022-10-09 ENCOUNTER — Other Ambulatory Visit (HOSPITAL_COMMUNITY)
Admission: RE | Admit: 2022-10-09 | Discharge: 2022-10-09 | Disposition: A | Discharge status: Home / Self Care | Payer: BC Managed Care – PPO | Source: Ambulatory Visit | Attending: Nurse Practitioner | Admitting: Nurse Practitioner

## 2022-10-09 ENCOUNTER — Telehealth: Payer: Self-pay

## 2022-10-09 VITALS — BP 118/70 | HR 97 | Temp 98.0°F | Ht 70.0 in | Wt 246.6 lb

## 2022-10-09 DIAGNOSIS — N760 Acute vaginitis: Secondary | ICD-10-CM | POA: Diagnosis not present

## 2022-10-09 DIAGNOSIS — N898 Other specified noninflammatory disorders of vagina: Secondary | ICD-10-CM | POA: Insufficient documentation

## 2022-10-09 DIAGNOSIS — B9689 Other specified bacterial agents as the cause of diseases classified elsewhere: Secondary | ICD-10-CM | POA: Insufficient documentation

## 2022-10-09 LAB — POC URINALSYSI DIPSTICK (AUTOMATED)
Bilirubin, UA: NEGATIVE
Blood, UA: NEGATIVE
Glucose, UA: NEGATIVE
Ketones, UA: NEGATIVE
Leukocytes, UA: NEGATIVE
Nitrite, UA: NEGATIVE
Protein, UA: NEGATIVE
Spec Grav, UA: 1.025 (ref 1.010–1.025)
Urobilinogen, UA: 1 E.U./dL
pH, UA: 6 (ref 5.0–8.0)

## 2022-10-09 NOTE — Progress Notes (Signed)
Bethanie Dicker, NP-C Phone: [phone]  Julia Brown is a 52 y.o. female who presents today for vaginal discharge.  Vaginitis: Patient complains of an abnormal vaginal discharge for 10 days. Vaginal symptoms include discharge described as creamy and malodorous and odor. Vulvar symptoms include none. STI Risk: Very low risk of STD exposure Discharge described as: white, thin, and malodorous. Other associated symptoms: odor. Menstrual pattern: She had been bleeding irregularly. Contraception: none  Patient was treated for Trichomonas in December with Flagyl. She has not had follow up since.   Social History   Tobacco Use  Smoking Status Some Days   Current packs/day: 0.75   Average packs/day: 0.8 packs/day for 15.0 years (11.3 ttl pk-yrs)   Types: Cigarettes  Smokeless Tobacco Never  Tobacco Comments   1 pack per week 09/12/2021    Current Outpatient Medications on File Prior to Visit  Medication Sig Dispense Refill   Continuous Blood Gluc Sensor (FREESTYLE LIBRE 2 SENSOR) MISC Change every 14 days. 6 each 3   amLODipine (NORVASC) 5 MG tablet Take 1 tablet (5 mg total) by mouth daily. 30 tablet 5   carvedilol (COREG) 3.125 MG tablet Take 1 tablet (3.125 mg total) by mouth 2 (two) times a day with meals. 180 tablet 6   Continuous Blood Gluc Receiver (FREESTYLE LIBRE 2 READER) DEVI 1 Device by Does not apply route every 14 (fourteen) days. 2 each 0   Insulin Pen Needle (B-D UF III MINI PEN NEEDLES) 31G X 5 MM MISC Use as directed. 100 each 11   LANTUS SOLOSTAR 100 UNIT/ML Solostar Pen Inject 0.52 mL (52 Units total) under the skin every morning. 15 mL 3   losartan-hydrochlorothiazide (HYZAAR) 100-25 MG tablet Take 1 tablet by mouth daily. 90 tablet 4   metFORMIN (GLUCOPHAGE-XR) 500 MG 24 hr tablet Take 2 tablets (1,000 mg total) by mouth two (2) times a day in the morning and at bedtime. 120 tablet 3   Multiple Vitamins-Minerals (MULTIVITAMIN WITH MINERALS) tablet Take 1 tablet by mouth  daily.     pantoprazole (PROTONIX) 40 MG tablet Take 1 tablet (40 mg total) by mouth daily. 90 tablet 1   pioglitazone (ACTOS) 15 MG tablet Take 2 tablets (30 mg total) by mouth daily. 180 tablet 3   rosuvastatin (CRESTOR) 20 MG tablet Take 1 tablet (20 mg total) by mouth daily. 90 tablet 3   scopolamine (TRANSDERM-SCOP) 1 MG/3DAYS Place 1 patch (1.5 mg total) onto the skin every 3 (three) days. (Patient not taking: Reported on 10/09/2022) 10 patch 0   No current facility-administered medications on file prior to visit.    ROS see history of present illness  Objective  Physical Exam Vitals:   10/09/22 1109  BP: 118/70  Pulse: 97  Temp: 98 F (36.7 C)  SpO2: 97%    BP Readings from Last 3 Encounters:  10/09/22 118/70  05/29/22 136/82  03/20/22 136/76   Wt Readings from Last 3 Encounters:  10/09/22 246 lb 9.6 oz (111.9 kg)  05/29/22 245 lb 9.6 oz (111.4 kg)  03/20/22 245 lb 3.2 oz (111.2 kg)    Physical Exam Constitutional:      General: She is not in acute distress.    Appearance: Normal appearance.  HENT:     Head: Normocephalic.  Cardiovascular:     Rate and Rhythm: Normal rate and regular rhythm.     Heart sounds: Normal heart sounds.  Pulmonary:     Effort: Pulmonary effort is normal.  Breath sounds: Normal breath sounds.  Skin:    General: Skin is warm and dry.  Neurological:     General: No focal deficit present.     Mental Status: She is alert.  Psychiatric:        Mood and Affect: Mood normal.        Behavior: Behavior normal.    Assessment/Plan: Please see individual problem list.  Vaginal discharge Assessment & Plan: Patient with discharge for 10 days. Vaginal swab obtained. Patient with Hx of trichomonas infection in December, she was treated at that time but had no follow up. Concern today for reinfection of trichomonas vs. Bacterial vaginosis. Both treated with Flagyl, offered to go ahead and treat, patient declined today as she would like to  wait on her results. Will contact patient with results. UA in office negative.   Orders: -     Cervicovaginal ancillary only -     POCT Urinalysis Dipstick (Automated)   Return if symptoms worsen or fail to improve.   Bethanie Dicker, NP-C [facility] - [employer]

## 2022-10-09 NOTE — Telephone Encounter (Signed)
Called pt to inform her that I as unable to add [facility] on [address] as I could not find it in the system and I was informed that it was shut down.   Pt did mention that she would use [facility] as well and I informed her that it has been added to her chart. Pt uses [facility] and will use [facility] for urgent meds

## 2022-10-09 NOTE — Assessment & Plan Note (Addendum)
Patient with discharge for 10 days. Vaginal swab obtained. Patient with Hx of trichomonas infection in December, she was treated at that time but had no follow up. Concern today for reinfection of trichomonas vs. Bacterial vaginosis. Both treated with Flagyl, offered to go ahead and treat, patient declined today as she would like to wait on her results. Will contact patient with results. UA in office negative.

## 2022-10-12 LAB — CERVICOVAGINAL ANCILLARY ONLY
Bacterial Vaginitis (gardnerella): POSITIVE — AB
Candida Glabrata: NEGATIVE
Candida Vaginitis: NEGATIVE
Chlamydia: NEGATIVE
Comment: NEGATIVE
Comment: NEGATIVE
Comment: NEGATIVE
Comment: NEGATIVE
Comment: NEGATIVE
Comment: NORMAL
Neisseria Gonorrhea: NEGATIVE
Trichomonas: NEGATIVE

## 2022-10-14 ENCOUNTER — Other Ambulatory Visit: Payer: Self-pay | Admitting: Family

## 2022-10-14 ENCOUNTER — Encounter: Payer: Self-pay | Admitting: Family

## 2022-10-14 DIAGNOSIS — N760 Acute vaginitis: Secondary | ICD-10-CM

## 2022-10-14 MED ORDER — METRONIDAZOLE 500 MG PO TABS
500.0000 mg | ORAL_TABLET | Freq: Two times a day (BID) | ORAL | 0 refills | Status: DC
Start: 2022-10-14 — End: 2023-01-11

## 2022-11-19 ENCOUNTER — Other Ambulatory Visit: Payer: Self-pay | Admitting: Family

## 2022-11-19 DIAGNOSIS — E119 Type 2 diabetes mellitus without complications: Secondary | ICD-10-CM

## 2022-12-08 ENCOUNTER — Other Ambulatory Visit: Payer: Self-pay | Admitting: Family

## 2022-12-10 ENCOUNTER — Other Ambulatory Visit: Payer: Self-pay | Admitting: Family

## 2023-01-03 ENCOUNTER — Other Ambulatory Visit: Payer: Self-pay | Admitting: Family

## 2023-01-08 ENCOUNTER — Ambulatory Visit: Payer: BC Managed Care – PPO | Admitting: Family

## 2023-01-08 ENCOUNTER — Encounter: Payer: Self-pay | Admitting: Family

## 2023-01-08 ENCOUNTER — Other Ambulatory Visit (HOSPITAL_COMMUNITY)
Admission: RE | Admit: 2023-01-08 | Discharge: 2023-01-08 | Disposition: A | Discharge status: Home / Self Care | Payer: BC Managed Care – PPO | Source: Ambulatory Visit | Attending: Family | Admitting: Family

## 2023-01-08 VITALS — BP 128/76 | HR 88 | Temp 98.0°F | Ht 68.0 in | Wt 248.8 lb

## 2023-01-08 DIAGNOSIS — N760 Acute vaginitis: Secondary | ICD-10-CM | POA: Diagnosis not present

## 2023-01-08 DIAGNOSIS — E1159 Type 2 diabetes mellitus with other circulatory complications: Secondary | ICD-10-CM | POA: Diagnosis not present

## 2023-01-08 DIAGNOSIS — Z136 Encounter for screening for cardiovascular disorders: Secondary | ICD-10-CM

## 2023-01-08 DIAGNOSIS — B3731 Acute candidiasis of vulva and vagina: Secondary | ICD-10-CM | POA: Diagnosis not present

## 2023-01-08 DIAGNOSIS — L299 Pruritus, unspecified: Secondary | ICD-10-CM

## 2023-01-08 DIAGNOSIS — M5441 Lumbago with sciatica, right side: Secondary | ICD-10-CM | POA: Diagnosis not present

## 2023-01-08 DIAGNOSIS — I10 Essential (primary) hypertension: Secondary | ICD-10-CM | POA: Diagnosis not present

## 2023-01-08 DIAGNOSIS — Z113 Encounter for screening for infections with a predominantly sexual mode of transmission: Secondary | ICD-10-CM | POA: Insufficient documentation

## 2023-01-08 DIAGNOSIS — B9689 Other specified bacterial agents as the cause of diseases classified elsewhere: Secondary | ICD-10-CM | POA: Insufficient documentation

## 2023-01-08 DIAGNOSIS — I152 Hypertension secondary to endocrine disorders: Secondary | ICD-10-CM

## 2023-01-08 DIAGNOSIS — M25511 Pain in right shoulder: Secondary | ICD-10-CM

## 2023-01-08 DIAGNOSIS — G8929 Other chronic pain: Secondary | ICD-10-CM

## 2023-01-08 DIAGNOSIS — M25512 Pain in left shoulder: Secondary | ICD-10-CM

## 2023-01-08 DIAGNOSIS — R7309 Other abnormal glucose: Secondary | ICD-10-CM

## 2023-01-08 DIAGNOSIS — R899 Unspecified abnormal finding in specimens from other organs, systems and tissues: Secondary | ICD-10-CM

## 2023-01-08 DIAGNOSIS — Z13220 Encounter for screening for lipoid disorders: Secondary | ICD-10-CM

## 2023-01-08 LAB — LIPID PANEL
Cholesterol: 195 mg/dL (ref 0–200)
HDL: 57.9 mg/dL (ref >39.00)
LDL Cholesterol: 89 mg/dL (ref 0–99)
NonHDL: 136.68
Total CHOL/HDL Ratio: 3
Triglycerides: 237 mg/dL — ABNORMAL HIGH (ref 0.0–149.0)
VLDL: 47.4 mg/dL — ABNORMAL HIGH (ref 0.0–40.0)

## 2023-01-08 LAB — CBC WITH DIFFERENTIAL/PLATELET
Basophils Absolute: 0.1 10*3/uL (ref 0.0–0.1)
Basophils Relative: 0.6 % (ref 0.0–3.0)
Eosinophils Absolute: 0.1 10*3/uL (ref 0.0–0.7)
Eosinophils Relative: 1.4 % (ref 0.0–5.0)
HCT: 38.8 % (ref 36.0–46.0)
Hemoglobin: 12.7 g/dL (ref 12.0–15.0)
Lymphocytes Relative: 21 % (ref 12.0–46.0)
Lymphs Abs: 2.2 10*3/uL (ref 0.7–4.0)
MCHC: 32.6 g/dL (ref 30.0–36.0)
MCV: 91.2 fL (ref 78.0–100.0)
Monocytes Absolute: 0.8 10*3/uL (ref 0.1–1.0)
Monocytes Relative: 7.4 % (ref 3.0–12.0)
Neutro Abs: 7.3 10*3/uL (ref 1.4–7.7)
Neutrophils Relative %: 69.6 % (ref 43.0–77.0)
Platelets: 326 10*3/uL (ref 150.0–400.0)
RBC: 4.25 Mil/uL (ref 3.87–5.11)
RDW: 13.1 % (ref 11.5–15.5)
WBC: 10.5 10*3/uL (ref 4.0–10.5)

## 2023-01-08 LAB — COMPREHENSIVE METABOLIC PANEL
ALT: 20 U/L (ref 0–35)
AST: 18 U/L (ref 0–37)
Albumin: 4.2 g/dL (ref 3.5–5.2)
Alkaline Phosphatase: 67 U/L (ref 39–117)
BUN: 15 mg/dL (ref 6–23)
CO2: 29 meq/L (ref 19–32)
Calcium: 10.1 mg/dL (ref 8.4–10.5)
Chloride: 97 meq/L (ref 96–112)
Creatinine, Ser: 0.86 mg/dL (ref 0.40–1.20)
GFR: 78.12 mL/min (ref >60.00)
Glucose, Bld: 167 mg/dL — ABNORMAL HIGH (ref 70–99)
Potassium: 4.7 meq/L (ref 3.5–5.1)
Sodium: 134 meq/L — ABNORMAL LOW (ref 135–145)
Total Bilirubin: 0.6 mg/dL (ref 0.2–1.2)
Total Protein: 7.2 g/dL (ref 6.0–8.3)

## 2023-01-08 MED ORDER — HYDROXYZINE HCL 10 MG PO TABS
10.0000 mg | ORAL_TABLET | Freq: Two times a day (BID) | ORAL | 0 refills | Status: AC | PRN
Start: 2023-01-08 — End: ?

## 2023-01-08 MED ORDER — MELOXICAM 7.5 MG PO TABS: 7.5000 mg | ORAL_TABLET | Freq: Every day | ORAL | 1 refills | Status: DC | PRN

## 2023-01-08 MED ORDER — GABAPENTIN 100 MG PO CAPS: 100.0000 mg | ORAL_CAPSULE | Freq: Three times a day (TID) | ORAL | 3 refills | Status: DC

## 2023-01-08 NOTE — Assessment & Plan Note (Addendum)
Question if DDD, stenosis present. Known mild scoliotic curvature.  Start gabapentin 100 mg 3 times daily.  Provided her with meloxicam 7.5 mg to use as needed.  Consider physical therapy, updating imaging at follow-up

## 2023-01-08 NOTE — Patient Instructions (Addendum)
Start mobic however use as needed and sparingly as discussed  Trial gabapentin for low back pain.  I am hopeful this may offer you some relief shoulder pain particularly at night   Call [facility] to schedule follow in regards to shoulder; concern for shoulder impingement and I do not want this to get worse  [address]  [address]  [place], [place] [address]  [phone] (Work)   For skin itching, trial over the counter claritin ( anti histamine , non sedating) and then you may use atarax with it for severe itching if not controlled; atarax is also an antihistamine however it more sedating so I would err to use when at home or at night.   Please let me know how you are doing

## 2023-01-08 NOTE — Assessment & Plan Note (Signed)
Etiology unknown.  Question if dry skin.  She will continue cocoa butter at home.  Advised her to start Claritin daily.  She may use Atarax 10 mg as needed for breakthrough pruritus.  Counseled on Atarax being sedating

## 2023-01-08 NOTE — Assessment & Plan Note (Signed)
Bilateral shoulder pain , concern for impingement.  Patient is established with [facility] orthopedics.  Advised her to call and see a follow-up appointment as concerned that range of motion may get worse.  I provided her with meloxicam to take as needed for pain.

## 2023-01-08 NOTE — Progress Notes (Addendum)
Assessment & Plan:  Chronic right-sided low back pain with right-sided sciatica Assessment & Plan: Question if DDD, stenosis present. Known mild scoliotic curvature.  Start gabapentin 100 mg 3 times daily.  Provided her with meloxicam 7.5 mg to use as needed.  Consider physical therapy, updating imaging at follow-up  Orders: -     Gabapentin; Take 1 capsule (100 mg total) by mouth 3 (three) times daily.  Dispense: 90 capsule; Refill: 3 -     Meloxicam; Take 1 tablet (7.5 mg total) by mouth daily as needed for pain.  Dispense: 30 tablet; Refill: 1  Elevated glucose  Essential hypertension  Abnormal laboratory test -     CBC with Differential/Platelet  Vulvovaginal candidiasis -     Cervicovaginal ancillary only  Hypertension associated with diabetes (HCC) -     Comprehensive metabolic panel  Encounter for lipid screening for cardiovascular disease -     Lipid panel  Pruritus Assessment & Plan: Etiology unknown.  Question if dry skin.  She will continue cocoa butter at home.  Advised her to start Claritin daily.  She may use Atarax 10 mg as needed for breakthrough pruritus.  Counseled on Atarax being sedating  Orders: -     hydrOXYzine HCl; Take 1 tablet (10 mg total) by mouth 2 (two) times daily as needed for anxiety.  Dispense: 30 tablet; Refill: 0  Chronic pain of both shoulders Assessment & Plan: Bilateral shoulder pain , concern for impingement.  Patient is established with [facility] orthopedics.  Advised her to call and see a follow-up appointment as concerned that range of motion may get worse.  I provided her with meloxicam to take as needed for pain.      Return precautions given.   Risks, benefits, and alternatives of the medications and treatment plan prescribed today were discussed, and patient expressed understanding.   Education regarding symptom management and diagnosis given to patient on AVS either electronically or printed.  Return in about 6 weeks (around  02/19/2023).  Rennie Plowman, FNP  Subjective:    Patient ID: Julia Brown, female    DOB: 11-29-1970, 52 y.o.   MRN: [id number]  CC: Julia Brown is a 52 y.o. female who presents today for follow up.   HPI: Complains of chronic anterior bilateral shoulder pain, episodic. Pain will flare. Some better today.   She is unable to raise her arms above her head. Painful to sleep on either shoulder.  No neck pain, HA, numbness in arms.   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   She also complains of posterior right low back pain ,worse of late.   Radiating 'burning' down right posterior buttocks.   She able urinate and have BM.   No saddle anesthesia, groin pain, dysuria,   She has tried ibuprofen, tylenol, bengay with temporary relief.   No fever, n, v, rash.   She also complains of bilateral excoriations of upper arms, once and while ankles, in which skin is very itchy.  No rash. No tick bites or known bug bites.  No seasonal allergies.  Using dove products, coco butter. No new detergents.   She requests repeat wet prep. No vaginal symptoms at this time.   She has follow-up scheduled with endocrinology today.  Lumbar x-ray 12/13/2017 mild scoliotic curvature  Allergies: Contrast media [iodinated contrast media], Statins, Jardiance [empagliflozin], Ozempic (0.25 or 0.5 mg-dose) [semaglutide(0.25 or 0.5mg -dos)], Bactrim [sulfamethoxazole-trimethoprim], Penicillins, and Trulicity [dulaglutide] Current Outpatient Medications on File Prior  to Visit  Medication Sig Dispense Refill  . Continuous Blood Gluc Sensor (FREESTYLE LIBRE 2 SENSOR) MISC Change every 14 days. 6 each 3  . amLODipine (NORVASC) 5 MG tablet Take 1 tablet (5 mg total) by mouth daily. 30 tablet 5  . B-D UF III MINI PEN NEEDLES 31G X 5 MM MISC Use as directed. 100 each 11  . carvedilol (COREG) 3.125 MG tablet Take 1 tablet (3.125 mg total) by mouth 2 (two) times a day with meals. 180 tablet 6   . Continuous Blood Gluc Receiver (FREESTYLE LIBRE 2 READER) DEVI 1 Device by Does not apply route every 14 (fourteen) days. 2 each 0  . LANTUS SOLOSTAR 100 UNIT/ML Solostar Pen Inject 0.52 mL (52 Units total) under the skin every morning. 15 mL 3  . losartan-hydrochlorothiazide (HYZAAR) 100-25 MG tablet Take 1 tablet by mouth daily. 90 tablet 4  . metFORMIN (GLUCOPHAGE-XR) 500 MG 24 hr tablet Take 2 tablets (1,000 mg total) by mouth two (2) times a day in the morning and at bedtime. 120 tablet 3  . metroNIDAZOLE (FLAGYL) 500 MG tablet Take 1 tablet (500 mg total) by mouth 2 (two) times daily. 14 tablet 0  . Multiple Vitamins-Minerals (MULTIVITAMIN WITH MINERALS) tablet Take 1 tablet by mouth daily.    . pantoprazole (PROTONIX) 40 MG tablet Take 1 tablet (40 mg total) by mouth daily. 90 tablet 1  . pioglitazone (ACTOS) 15 MG tablet Take 2 tablets (30 mg total) by mouth daily. 180 tablet 3  . rosuvastatin (CRESTOR) 20 MG tablet Take 1 tablet (20 mg total) by mouth daily. 90 tablet 3  . scopolamine (TRANSDERM-SCOP) 1 MG/3DAYS Place 1 patch (1.5 mg total) onto the skin every 3 (three) days. (Patient not taking: Reported on 10/09/2022) 10 patch 0   No current facility-administered medications on file prior to visit.    Review of Systems  Constitutional:  Negative for chills and fever.  Respiratory:  Negative for cough.   Cardiovascular:  Negative for chest pain and palpitations.  Gastrointestinal:  Negative for nausea and vomiting.  Musculoskeletal:  Positive for arthralgias.  Skin:  Negative for rash.  Neurological:  Positive for numbness.      Objective:    BP 128/76   Pulse 88   Temp 98 F (36.7 C) (Oral)   Ht 5\' 8"  (1.727 m)   Wt 248 lb 12.8 oz (112.9 kg)   SpO2 99%   BMI 37.83 kg/m  BP Readings from Last 3 Encounters:  01/08/23 128/76  10/09/22 118/70  05/29/22 136/82   Wt Readings from Last 3 Encounters:  01/08/23 248 lb 12.8 oz (112.9 kg)  10/09/22 246 lb 9.6 oz (111.9  kg)  05/29/22 245 lb 9.6 oz (111.4 kg)    Physical Exam Vitals reviewed.  Constitutional:      Appearance: She is well-developed.  Eyes:     Conjunctiva/sclera: Conjunctivae normal.  Cardiovascular:     Rate and Rhythm: Normal rate and regular rhythm.     Pulses: Normal pulses.     Heart sounds: Normal heart sounds.  Pulmonary:     Effort: Pulmonary effort is normal.     Breath sounds: Normal breath sounds. No wheezing, rhonchi or rales.  Musculoskeletal:     Right shoulder: Tenderness present. No swelling. Decreased range of motion.     Left shoulder: Tenderness present. Decreased range of motion.       Arms:     Lumbar back: No swelling, edema, spasms, tenderness or bony  tenderness. Normal range of motion. Negative right straight leg raise test and negative left straight leg raise test.     Comments: Right and left Shoulder:   No asymmetry of shoulders when comparing right and left.P ain with palpation over glenohumeral joint lines. No Franklin joint, AC joint. She is unable to perform internal rotation due to pain.  Range of motion limited to shoulder height.  Unable to perform lateral arm raises beyond shoulder    Strength and sensation normal BUE's.  Bilateral Hip: No limp or waddling gait. Full ROM with flexion and hip rotation in flexion.    No pain of lateral hip with  (flexion-abduction-external rotation) test.   No pain with deep palpation of greater trochanter.      Skin:    General: Skin is warm and dry.     Comments: Excoriations of the skin noted bilateral upper arms.  No rash  Neurological:     Mental Status: She is alert.     Sensory: No sensory deficit.     Deep Tendon Reflexes:     Reflex Scores:      Patellar reflexes are 2+ on the right side and 2+ on the left side.    Comments: Sensation and strength intact bilateral lower extremities.  Psychiatric:        Speech: Speech normal.        Behavior: Behavior normal.        Thought Content: Thought content  normal.

## 2023-01-08 NOTE — Addendum Note (Signed)
Addended by: Allegra Grana on: 01/08/2023 11:39 AM   Modules accepted: Orders

## 2023-01-11 ENCOUNTER — Other Ambulatory Visit: Payer: Self-pay | Admitting: Family

## 2023-01-11 DIAGNOSIS — N760 Acute vaginitis: Secondary | ICD-10-CM

## 2023-01-11 LAB — CERVICOVAGINAL ANCILLARY ONLY
Bacterial Vaginitis (gardnerella): POSITIVE — AB
Candida Glabrata: NEGATIVE
Candida Vaginitis: NEGATIVE
Chlamydia: NEGATIVE
Comment: NEGATIVE
Comment: NEGATIVE
Comment: NEGATIVE
Comment: NEGATIVE
Comment: NEGATIVE
Comment: NORMAL
Neisseria Gonorrhea: NEGATIVE
Trichomonas: NEGATIVE

## 2023-01-11 MED ORDER — METRONIDAZOLE 500 MG PO TABS
500.0000 mg | ORAL_TABLET | Freq: Two times a day (BID) | ORAL | 0 refills | Status: DC
Start: 2023-01-11 — End: 2023-04-15

## 2023-01-19 ENCOUNTER — Encounter: Payer: Self-pay | Admitting: Family

## 2023-01-19 ENCOUNTER — Other Ambulatory Visit: Payer: Self-pay | Admitting: Family

## 2023-01-19 DIAGNOSIS — E1110 Type 2 diabetes mellitus with ketoacidosis without coma: Secondary | ICD-10-CM

## 2023-01-22 ENCOUNTER — Other Ambulatory Visit: Payer: Self-pay | Admitting: Family

## 2023-01-22 ENCOUNTER — Encounter: Payer: Self-pay | Admitting: Family

## 2023-01-22 DIAGNOSIS — B379 Candidiasis, unspecified: Secondary | ICD-10-CM

## 2023-01-22 MED ORDER — FLUCONAZOLE 150 MG PO TABS: 150.0000 mg | ORAL_TABLET | Freq: Once | ORAL | 0 refills | Status: AC

## 2023-02-09 NOTE — Telephone Encounter (Signed)
Error

## 2023-03-09 ENCOUNTER — Other Ambulatory Visit: Payer: Self-pay | Admitting: Family

## 2023-03-09 DIAGNOSIS — E1110 Type 2 diabetes mellitus with ketoacidosis without coma: Secondary | ICD-10-CM

## 2023-04-15 ENCOUNTER — Other Ambulatory Visit (HOSPITAL_COMMUNITY)
Admission: RE | Admit: 2023-04-15 | Discharge: 2023-04-15 | Disposition: A | Discharge status: Home / Self Care | Payer: 59 | Source: Ambulatory Visit | Attending: Family | Admitting: Family

## 2023-04-15 ENCOUNTER — Encounter: Payer: Self-pay | Admitting: Family

## 2023-04-15 ENCOUNTER — Ambulatory Visit: Payer: 59 | Admitting: Family

## 2023-04-15 VITALS — BP 128/80 | HR 86 | Temp 97.7°F | Ht 70.0 in | Wt 247.0 lb

## 2023-04-15 DIAGNOSIS — N898 Other specified noninflammatory disorders of vagina: Secondary | ICD-10-CM | POA: Insufficient documentation

## 2023-04-15 DIAGNOSIS — E785 Hyperlipidemia, unspecified: Secondary | ICD-10-CM | POA: Diagnosis not present

## 2023-04-15 DIAGNOSIS — E1110 Type 2 diabetes mellitus with ketoacidosis without coma: Secondary | ICD-10-CM | POA: Diagnosis not present

## 2023-04-15 DIAGNOSIS — I10 Essential (primary) hypertension: Secondary | ICD-10-CM

## 2023-04-15 DIAGNOSIS — R7309 Other abnormal glucose: Secondary | ICD-10-CM

## 2023-04-15 DIAGNOSIS — Z7985 Long-term (current) use of injectable non-insulin antidiabetic drugs: Secondary | ICD-10-CM

## 2023-04-15 DIAGNOSIS — Z1231 Encounter for screening mammogram for malignant neoplasm of breast: Secondary | ICD-10-CM

## 2023-04-15 LAB — POCT GLYCOSYLATED HEMOGLOBIN (HGB A1C): Hemoglobin A1C: 7.9 % — AB (ref 4.0–5.6)

## 2023-04-15 NOTE — Progress Notes (Signed)
Assessment & Plan:  Essential hypertension Assessment & Plan: Chronic, stable. Continue amlodipine 5 mg, carvedilol 3.125 mg twice daily, losartan-hydrochlorothiazide 100-25 mg.   Orders: -     Lipid panel; Future -     Microalbumin / creatinine urine ratio; Future -     VITAMIN D 25 Hydroxy (Vit-D Deficiency, Fractures); Future -     TSH; Future -     Comprehensive metabolic panel; Future -     CBC with Differential/Platelet; Future  Elevated glucose -     POCT glycosylated hemoglobin (Hb A1C)  Type 2 diabetes mellitus with ketoacidosis without coma, without long-term current use of insulin (HCC) Assessment & Plan: Lab Results  Component Value Date   HGBA1C 7.9 (A) 04/15/2023  Uncontrolled. She is now established with endocrinology and I advised close follow-up in regards to making changes to her regimen.  Advise she may consider collaborating with Dr. Gershon Crane to decrease Lantus and perhaps increase Mounjaro if tolerating.  She will reach out to endocrine  Orders: -     Lipid panel; Future -     Microalbumin / creatinine urine ratio; Future  Encounter for screening mammogram for malignant neoplasm of breast -     3D Screening Mammogram, Left and Right; Future  Vaginal odor -     Cervicovaginal ancillary only  Hyperlipidemia, unspecified hyperlipidemia type Assessment & Plan: LDL previously greater than 70, pending repeat lipid.  Due to myalgias, patient is taking atorvastatin to 20 mg every other day.   Consider addition of Zetia   Other orders -     Rosuvastatin Calcium; Take 1 tablet (20 mg total) by mouth every other day.     Return precautions given.   Risks, benefits, and alternatives of the medications and treatment plan prescribed today were discussed, and patient expressed understanding.   Education regarding symptom management and diagnosis given to patient on AVS either electronically or printed.  Return in about 3 months (around 07/14/2023) for  Fasting labs in 2-3 weeks.  Rennie Plowman, FNP  Subjective:    Patient ID: [name], female    DOB: 1970-10-11, 53 y.o.   MRN: [id number]  CC: TONEKA FULLEN is a 53 y.o. female who presents today for medication follow up.   HPI: She complains of foul vaginal odor over the past couple of weeks.  She is concern for bacterial vaginitis  Denies vaginal itching, dysuria, pelvic pain  No concerns for STDs      Compliant with mounjaro 5mg , metformin 2000 mg daily, Actos 15 mg daily , Lantus 25 units qd  She is following with endocrine however prefers to have A1c checked here  bacterial vaginitis 01/08/2023 Allergies: Contrast media [iodinated contrast media], Statins, Jardiance [empagliflozin], Ozempic (0.25 or 0.5 mg-dose) [semaglutide(0.25 or 0.5mg -dos)], Bactrim [sulfamethoxazole-trimethoprim], Penicillins, and Trulicity [dulaglutide] Current Outpatient Medications on File Prior to Visit  Medication Sig Dispense Refill   amLODipine (NORVASC) 5 MG tablet Take 1 tablet (5 mg total) by mouth daily. 30 tablet 5   B-D UF III MINI PEN NEEDLES 31G X 5 MM MISC Use as directed. 100 each 11   carvedilol (COREG) 3.125 MG tablet Take 1 tablet (3.125 mg total) by mouth 2 (two) times a day with meals. 180 tablet 6   Continuous Blood Gluc Receiver (FREESTYLE LIBRE 2 READER) DEVI 1 Device by Does not apply route every 14 (fourteen) days. 2 each 0   Continuous Blood Gluc Sensor (FREESTYLE LIBRE 2 SENSOR) MISC Change every 14 days.  6 each 3   gabapentin (NEURONTIN) 100 MG capsule Take 1 capsule (100 mg total) by mouth 3 (three) times daily. 90 capsule 3   hydrOXYzine (ATARAX) 10 MG tablet Take 1 tablet (10 mg total) by mouth 2 (two) times daily as needed for anxiety. 30 tablet 0   LANTUS SOLOSTAR 100 UNIT/ML Solostar Pen Inject 0.52 mL (52 Units total) under the skin every morning. 15 mL 3   meloxicam (MOBIC) 7.5 MG tablet Take 1 tablet (7.5 mg total) by mouth daily as needed for pain. 30  tablet 1   metFORMIN (GLUCOPHAGE-XR) 500 MG 24 hr tablet Take 2 tablets (1,000 mg total) by mouth two (2) times a day in the morning and at bedtime. 120 tablet 3   Multiple Vitamins-Minerals (MULTIVITAMIN WITH MINERALS) tablet Take 1 tablet by mouth daily.     pantoprazole (PROTONIX) 40 MG tablet Take 1 tablet (40 mg total) by mouth daily. 90 tablet 1   pioglitazone (ACTOS) 15 MG tablet Take 1 tablet (15 mg total) by mouth daily. 90 tablet 1   scopolamine (TRANSDERM-SCOP) 1 MG/3DAYS Place 1 patch (1.5 mg total) onto the skin every 3 (three) days. 10 patch 0   losartan-hydrochlorothiazide (HYZAAR) 100-25 MG tablet Take 1 tablet by mouth daily. (Patient not taking: Reported on 04/15/2023) 90 tablet 4   No current facility-administered medications on file prior to visit.    Review of Systems  Constitutional:  Negative for chills and fever.  Respiratory:  Negative for cough.   Cardiovascular:  Negative for chest pain and palpitations.  Gastrointestinal:  Negative for nausea and vomiting.  Genitourinary:  Negative for dysuria, vaginal discharge and vaginal pain.      Objective:    BP 128/80   Pulse 86   Temp 97.7 F (36.5 C) (Oral)   Ht 5\' 10"  (1.778 m)   Wt 247 lb (112 kg)   LMP  (LMP Unknown)   SpO2 99%   BMI 35.44 kg/m  BP Readings from Last 3 Encounters:  04/15/23 128/80  01/08/23 128/76  10/09/22 118/70   Wt Readings from Last 3 Encounters:  04/15/23 247 lb (112 kg)  01/08/23 248 lb 12.8 oz (112.9 kg)  10/09/22 246 lb 9.6 oz (111.9 kg)    Physical Exam Vitals reviewed.  Constitutional:      Appearance: She is well-developed.  Eyes:     Conjunctiva/sclera: Conjunctivae normal.  Cardiovascular:     Rate and Rhythm: Normal rate and regular rhythm.     Pulses: Normal pulses.     Heart sounds: Normal heart sounds.  Pulmonary:     Effort: Pulmonary effort is normal.     Breath sounds: Normal breath sounds. No wheezing, rhonchi or rales.  Skin:    General: Skin is warm  and dry.  Neurological:     Mental Status: She is alert.  Psychiatric:        Speech: Speech normal.        Behavior: Behavior normal.        Thought Content: Thought content normal.

## 2023-04-15 NOTE — Patient Instructions (Signed)
 Julia Brown

## 2023-04-16 MED ORDER — ROSUVASTATIN CALCIUM 20 MG PO TABS: 20.0000 mg | ORAL_TABLET | ORAL | Status: DC

## 2023-04-16 NOTE — Assessment & Plan Note (Signed)
Chronic, stable. Continue amlodipine 5 mg, carvedilol 3.125 mg twice daily, losartan-hydrochlorothiazide 100-25 mg.

## 2023-04-16 NOTE — Assessment & Plan Note (Addendum)
LDL previously greater than 70, pending repeat lipid.  Due to myalgias, patient is taking atorvastatin to 20 mg every other day.   Consider addition of Zetia

## 2023-04-16 NOTE — Assessment & Plan Note (Signed)
Lab Results  Component Value Date   HGBA1C 7.9 (A) 04/15/2023  Uncontrolled. She is now established with endocrinology and I advised close follow-up in regards to making changes to her regimen.  Advise she may consider collaborating with Dr. Gershon Crane to decrease Lantus and perhaps increase Mounjaro if tolerating.  She will reach out to endocrine

## 2023-04-18 LAB — CERVICOVAGINAL ANCILLARY ONLY
Bacterial Vaginitis (gardnerella): POSITIVE — AB
Candida Glabrata: NEGATIVE
Candida Vaginitis: NEGATIVE
Chlamydia: NEGATIVE
Comment: NEGATIVE
Comment: NEGATIVE
Comment: NEGATIVE
Comment: NEGATIVE
Comment: NEGATIVE
Comment: NORMAL
Neisseria Gonorrhea: NEGATIVE
Trichomonas: NEGATIVE

## 2023-04-20 ENCOUNTER — Encounter: Payer: Self-pay | Admitting: Family

## 2023-04-20 ENCOUNTER — Other Ambulatory Visit: Payer: Self-pay | Admitting: Family

## 2023-04-20 DIAGNOSIS — B9689 Other specified bacterial agents as the cause of diseases classified elsewhere: Secondary | ICD-10-CM

## 2023-04-20 MED ORDER — METRONIDAZOLE 0.75 % VA GEL: 1.0000 | Freq: Every day | VAGINAL | 0 refills | Status: AC

## 2023-04-20 MED ORDER — METRONIDAZOLE 0.75 % VA GEL: VAGINAL | 0 refills | Status: DC

## 2023-04-21 ENCOUNTER — Other Ambulatory Visit: Payer: Self-pay | Admitting: Family

## 2023-04-26 ENCOUNTER — Other Ambulatory Visit: Payer: 59

## 2023-04-27 ENCOUNTER — Other Ambulatory Visit: Payer: 59

## 2023-04-27 ENCOUNTER — Ambulatory Visit: Payer: 59 | Admitting: Sleep Medicine

## 2023-04-30 ENCOUNTER — Other Ambulatory Visit: Payer: 59

## 2023-04-30 ENCOUNTER — Telehealth: Payer: Self-pay

## 2023-04-30 NOTE — Telephone Encounter (Signed)
 LVM to inform pt of below message:  Pulmonology has been trying to reach her to schedule CT lung cancer screening for the lung cancer screening program.  If she is interested in this test please have her call below.  I strongly recommend this test   Call to make an appointment for Annual Lung cancer screen  With CT Chest:  [phone]. Let me know if any issues in doing so.

## 2023-05-03 ENCOUNTER — Ambulatory Visit: Payer: 59 | Admitting: Sleep Medicine

## 2023-05-22 ENCOUNTER — Other Ambulatory Visit: Payer: Self-pay | Admitting: Family

## 2023-05-22 DIAGNOSIS — E119 Type 2 diabetes mellitus without complications: Secondary | ICD-10-CM

## 2023-05-26 ENCOUNTER — Other Ambulatory Visit: Payer: Self-pay | Admitting: Family

## 2023-05-26 ENCOUNTER — Encounter: Payer: Self-pay | Admitting: Family

## 2023-05-26 DIAGNOSIS — G4733 Obstructive sleep apnea (adult) (pediatric): Secondary | ICD-10-CM

## 2023-06-06 ENCOUNTER — Other Ambulatory Visit: Payer: Self-pay | Admitting: Family

## 2023-06-17 ENCOUNTER — Ambulatory Visit: Admitting: Sleep Medicine

## 2023-06-17 ENCOUNTER — Encounter: Payer: Self-pay | Admitting: Sleep Medicine

## 2023-06-17 VITALS — BP 110/72 | HR 92 | Temp 97.1°F | Ht 70.0 in | Wt 251.0 lb

## 2023-06-17 DIAGNOSIS — G4733 Obstructive sleep apnea (adult) (pediatric): Secondary | ICD-10-CM

## 2023-06-17 DIAGNOSIS — E785 Hyperlipidemia, unspecified: Secondary | ICD-10-CM

## 2023-06-17 DIAGNOSIS — I10 Essential (primary) hypertension: Secondary | ICD-10-CM

## 2023-06-17 NOTE — Progress Notes (Signed)
 Name:Julia Brown MRN: [id number] DOB: 04-09-1970   CHIEF COMPLAINT:  REESTABLISH CARE FOR OSA   HISTORY OF PRESENT ILLNESS:  Julia Brown is a 53 y.o. w/ a h/o OSA, HTN, hyperlipidemia, GERD, DMII and morbid obesity who presents to reestablish care for OSA. Reports that she was initially diagnosed with mild OSA (AHI 12, O2 nadir 74%) and was recommended to start on CPAP therapy, however could not afford CPAP therapy at the time. Denies any significant weight changes.   Reports c/o loud snoring and excessive daytime sleepiness which has been present for several years. Reports nocturnal awakenings due to unclear reasons, however does not have difficulty falling back to sleep. Admits to night sweats. Denies dry mouth, morning headaches, RLS symptoms, dream enactment, cataplexy, hypnagogic or hypnapompic hallucinations. Denies a family history of sleep apnea. Denies drowsy driving. Drinks 1-2 cups of coffee, occasional alcohol use, smokes 4 cigarettes daily, denies illicit drug use.   Bedtime 9-10 pm Sleep onset 30 mins Rise time 5:15-7:30 am   EPWORTH SLEEP SCORE 2    07/15/2021    4:00 PM  Results of the Epworth flowsheet  Sitting and reading 1  Watching TV 1  Sitting, inactive in a public place (e.g. a theatre or a meeting) 0  As a passenger in a car for an hour without a break 0  Lying down to rest in the afternoon when circumstances permit 0  Sitting and talking to someone 0  Sitting quietly after a lunch without alcohol 0  In a car, while stopped for a few minutes in traffic 0  Total score 2     PAST MEDICAL HISTORY :   has a past medical history of Chicken pox, Diabetes mellitus, GERD (gastroesophageal reflux disease), Hyperlipidemia, Hypertension, and UTI (lower urinary tract infection).  has a past surgical history that includes Tubal ligation; uterine ablation; Cholecystectomy (1993); Cesarean section (1994); Ablation (2007); ORIF femur fracture (Right);  Esophagogastroduodenoscopy (N/A, 06/22/2020); and Colonoscopy with propofol (N/A, 07/25/2020). Prior to Admission medications   Medication Sig Start Date End Date Taking? Authorizing Provider  amLODipine (NORVASC) 5 MG tablet Take 1 tablet (5 mg total) by mouth daily. 01/04/23   Allegra Grana, FNP  B-D UF III MINI PEN NEEDLES 31G X 5 MM MISC Use as directed. 12/08/22   Allegra Grana, FNP  carvedilol (COREG) 3.125 MG tablet Take 1 tablet (3.125 mg total) by mouth two (2) times a day with meals. 04/21/23   Allegra Grana, FNP  Continuous Blood Gluc Receiver (FREESTYLE LIBRE 2 READER) DEVI 1 Device by Does not apply route every 14 (fourteen) days. 07/26/20   McLean-Scocuzza, Pasty Spillers, MD  Continuous Blood Gluc Sensor (FREESTYLE LIBRE 2 SENSOR) MISC Change every 14 days. 07/03/22   Allegra Grana, FNP  gabapentin (NEURONTIN) 100 MG capsule Take 1 capsule (100 mg total) by mouth 3 (three) times daily. 01/08/23   Allegra Grana, FNP  hydrOXYzine (ATARAX) 10 MG tablet Take 1 tablet (10 mg total) by mouth 2 (two) times daily as needed for anxiety. 01/08/23   Allegra Grana, FNP  LANTUS SOLOSTAR 100 UNIT/ML Solostar Pen Inject 0.52 mL (52 Units total) under the skin every morning. 05/24/23   Allegra Grana, FNP  losartan-hydrochlorothiazide (HYZAAR) 100-25 MG tablet Take 1 tablet by mouth daily. Patient not taking: Reported on 04/15/2023 09/07/22   Allegra Grana, FNP  meloxicam (MOBIC) 7.5 MG tablet Take 1 tablet (7.5 mg total) by mouth  daily as needed for pain. 01/08/23   Allegra Grana, FNP  metFORMIN (GLUCOPHAGE-XR) 500 MG 24 hr tablet Take 2 tablets (1,000 mg total) by mouth two (2) times a day in the morning and at bedtime. 03/10/23   Allegra Grana, FNP  metroNIDAZOLE (METROGEL) 0.75 % vaginal gel One application placed intravaginally twice a week for four to six months. 04/20/23   Allegra Grana, FNP  Multiple Vitamins-Minerals (MULTIVITAMIN WITH MINERALS) tablet Take 1  tablet by mouth daily.    [provider]  pantoprazole (PROTONIX) 40 MG tablet Take 1 tablet (40 mg total) by mouth daily. 12/10/22   Allegra Grana, FNP  pioglitazone (ACTOS) 15 MG tablet Take 1 tablet (15 mg total) by mouth daily. 01/20/23   Allegra Grana, FNP  rosuvastatin (CRESTOR) 20 MG tablet Take 1 tablet (20 mg total) by mouth daily. 06/07/23   Allegra Grana, FNP  scopolamine (TRANSDERM-SCOP) 1 MG/3DAYS Place 1 patch (1.5 mg total) onto the skin every 3 (three) days. 07/14/21   Allegra Grana, FNP   Allergies  Allergen Reactions   Contrast Media [Iodinated Contrast Media] Anaphylaxis    Wheezing and rash immediately after CT w/.   Statins Palpitations   Jardiance [Empagliflozin]     DKA hospitalized 05/2020   Ozempic (0.25 Or 0.5 Mg-Dose) [Semaglutide(0.25 Or 0.5mg -Dos)] Nausea Only   Bactrim [Sulfamethoxazole-Trimethoprim] Itching    itching   Penicillins Rash   Trulicity [Dulaglutide] Nausea Only    FAMILY HISTORY:  family history includes Cancer in her maternal aunt; Depression in her mother; Diabetes in her father, maternal grandfather, maternal grandmother, and mother; Heart disease in her father and another family member; Hyperlipidemia in her father and mother; Hypertension in her mother; Schizophrenia in her mother. SOCIAL HISTORY:  reports that she has been smoking cigarettes. She has a 11.3 pack-year smoking history. She has never used smokeless tobacco. She reports current alcohol use. She reports that she does not use drugs.   Review of Systems:  Gen:  Denies  fever, sweats, chills weight loss  HEENT: Denies blurred vision, double vision, ear pain, eye pain, hearing loss, nose bleeds, sore throat Cardiac:  No dizziness, chest pain or heaviness, chest tightness,edema, No JVD Resp:   No cough, -sputum production, -shortness of breath,-wheezing, -hemoptysis,  Gi: Denies swallowing difficulty, stomach pain, nausea or vomiting, diarrhea,  constipation, bowel incontinence Gu:  Denies bladder incontinence, burning urine Ext:   Denies Joint pain, stiffness or swelling Skin: Denies  skin rash, easy bruising or bleeding or hives Endoc:  Denies polyuria, polydipsia , polyphagia or weight change Psych:   Denies depression, insomnia or hallucinations  Other:  All other systems negative  VITAL SIGNS: BP 110/72 (BP Location: Right Arm, Cuff Size: Large)   Pulse 92   Temp (!) 97.1 F (36.2 C)   Ht 5\' 10"  (1.778 m)   Wt 251 lb (113.9 kg)   SpO2 98%   BMI 36.01 kg/m     Physical Examination:   General Appearance: No distress  EYES PERRLA, EOM intact.   NECK Supple, No JVD Mallampati IV Pulmonary: normal breath sounds, No wheezing.  CardiovascularNormal S1,S2.  No m/r/g.   Abdomen: Benign, Soft, non-tender. Skin:   warm, no rashes, no ecchymosis  Extremities: normal, no cyanosis, clubbing. Neuro:without focal findings,  speech normal  PSYCHIATRIC: Mood, affect within normal limits.   ASSESSMENT AND PLAN  OSA Reviewed HST results with patient. Starting on APAP therapy set to 4-16 cm H2O.  Discussed the consequences of untreated sleep apnea. Advised not to drive drowsy for safety of patient and others. Will follow up in 3 months to review CPAP efficacy and compliance data.    HTN Stable, on current management. Following with PCP.    Patient  satisfied with Plan of action and management. All questions answered  Follow up to review CPAP efficacy and compliance data in 3 months.    I spent a total of 41 minutes reviewing chart data, face-to-face evaluation with the patient, counseling and coordination of care as detailed above.    Tempie Hoist, M.D.  Sleep Medicine [facility]

## 2023-06-19 ENCOUNTER — Other Ambulatory Visit: Payer: Self-pay | Admitting: Family

## 2023-07-05 ENCOUNTER — Other Ambulatory Visit: Payer: Self-pay | Admitting: Family

## 2023-07-15 ENCOUNTER — Other Ambulatory Visit: Payer: Self-pay | Admitting: Family

## 2023-07-15 DIAGNOSIS — E1110 Type 2 diabetes mellitus with ketoacidosis without coma: Secondary | ICD-10-CM

## 2023-08-01 ENCOUNTER — Other Ambulatory Visit: Payer: Self-pay | Admitting: Family

## 2023-08-03 ENCOUNTER — Encounter: Payer: Self-pay | Admitting: Family

## 2023-08-03 NOTE — Telephone Encounter (Signed)
 Medication pended.

## 2023-08-04 ENCOUNTER — Other Ambulatory Visit: Payer: Self-pay | Admitting: Family

## 2023-08-04 MED ORDER — CELECOXIB 100 MG PO CAPS: 100.0000 mg | ORAL_CAPSULE | Freq: Two times a day (BID) | ORAL | 2 refills | Status: DC

## 2023-08-12 ENCOUNTER — Ambulatory Visit: Admitting: Family

## 2023-08-19 ENCOUNTER — Ambulatory Visit: Admitting: Family

## 2023-08-19 DIAGNOSIS — E1110 Type 2 diabetes mellitus with ketoacidosis without coma: Secondary | ICD-10-CM

## 2023-09-09 ENCOUNTER — Ambulatory Visit: Admitting: Family

## 2023-09-09 ENCOUNTER — Encounter: Payer: Self-pay | Admitting: Family

## 2023-09-09 ENCOUNTER — Other Ambulatory Visit (HOSPITAL_COMMUNITY)
Admission: RE | Admit: 2023-09-09 | Discharge: 2023-09-09 | Disposition: A | Discharge status: Home / Self Care | Source: Ambulatory Visit | Attending: Family | Admitting: Family

## 2023-09-09 VITALS — BP 118/70 | HR 93 | Temp 97.7°F | Ht 70.0 in | Wt 247.8 lb

## 2023-09-09 DIAGNOSIS — R899 Unspecified abnormal finding in specimens from other organs, systems and tissues: Secondary | ICD-10-CM

## 2023-09-09 DIAGNOSIS — I10 Essential (primary) hypertension: Secondary | ICD-10-CM | POA: Diagnosis not present

## 2023-09-09 DIAGNOSIS — E785 Hyperlipidemia, unspecified: Secondary | ICD-10-CM | POA: Diagnosis not present

## 2023-09-09 DIAGNOSIS — E1110 Type 2 diabetes mellitus with ketoacidosis without coma: Secondary | ICD-10-CM

## 2023-09-09 DIAGNOSIS — N898 Other specified noninflammatory disorders of vagina: Secondary | ICD-10-CM

## 2023-09-09 DIAGNOSIS — Z1231 Encounter for screening mammogram for malignant neoplasm of breast: Secondary | ICD-10-CM

## 2023-09-09 LAB — LIPID PANEL
Cholesterol: 177 mg/dL (ref 0–200)
HDL: 64.4 mg/dL (ref >39.00)
LDL Cholesterol: 68 mg/dL (ref 0–99)
NonHDL: 112.66
Total CHOL/HDL Ratio: 3
Triglycerides: 221 mg/dL — ABNORMAL HIGH (ref 0.0–149.0)
VLDL: 44.2 mg/dL — ABNORMAL HIGH (ref 0.0–40.0)

## 2023-09-09 LAB — COMPREHENSIVE METABOLIC PANEL WITH GFR
ALT: 26 U/L (ref 0–35)
AST: 24 U/L (ref 0–37)
Albumin: 4.6 g/dL (ref 3.5–5.2)
Alkaline Phosphatase: 63 U/L (ref 39–117)
BUN: 15 mg/dL (ref 6–23)
CO2: 28 meq/L (ref 19–32)
Calcium: 9.9 mg/dL (ref 8.4–10.5)
Chloride: 99 meq/L (ref 96–112)
Creatinine, Ser: 0.93 mg/dL (ref 0.40–1.20)
GFR: 70.78 mL/min (ref >60.00)
Glucose, Bld: 118 mg/dL — ABNORMAL HIGH (ref 70–99)
Potassium: 4.4 meq/L (ref 3.5–5.1)
Sodium: 136 meq/L (ref 135–145)
Total Bilirubin: 0.5 mg/dL (ref 0.2–1.2)
Total Protein: 7.4 g/dL (ref 6.0–8.3)

## 2023-09-09 LAB — MICROALBUMIN / CREATININE URINE RATIO
Creatinine,U: 209.5 mg/dL
Microalb Creat Ratio: UNDETERMINED mg/g (ref 0.0–30.0)
Microalb, Ur: 0.7 mg/dL (ref 0.0–1.9)

## 2023-09-09 LAB — CBC WITH DIFFERENTIAL/PLATELET
Basophils Absolute: 0.1 10*3/uL (ref 0.0–0.1)
Basophils Relative: 0.9 % (ref 0.0–3.0)
Eosinophils Absolute: 0.3 10*3/uL (ref 0.0–0.7)
Eosinophils Relative: 2.8 % (ref 0.0–5.0)
HCT: 38.5 % (ref 36.0–46.0)
Hemoglobin: 13 g/dL (ref 12.0–15.0)
Lymphocytes Relative: 28.8 % (ref 12.0–46.0)
Lymphs Abs: 2.9 10*3/uL (ref 0.7–4.0)
MCHC: 33.8 g/dL (ref 30.0–36.0)
MCV: 88 fl (ref 78.0–100.0)
Monocytes Absolute: 0.6 10*3/uL (ref 0.1–1.0)
Monocytes Relative: 6.3 % (ref 3.0–12.0)
Neutro Abs: 6.2 10*3/uL (ref 1.4–7.7)
Neutrophils Relative %: 61.2 % (ref 43.0–77.0)
Platelets: 290 10*3/uL (ref 150.0–400.0)
RBC: 4.38 Mil/uL (ref 3.87–5.11)
RDW: 13 % (ref 11.5–15.5)
WBC: 10.1 10*3/uL (ref 4.0–10.5)

## 2023-09-09 LAB — HEMOGLOBIN A1C: Hgb A1c MFr Bld: 7.5 % — ABNORMAL HIGH (ref 4.6–6.5)

## 2023-09-09 LAB — VITAMIN D 25 HYDROXY (VIT D DEFICIENCY, FRACTURES): VITD: 61.62 ng/mL (ref 30.00–100.00)

## 2023-09-09 LAB — TSH: TSH: 2.04 u[IU]/mL (ref 0.35–5.50)

## 2023-09-09 NOTE — Assessment & Plan Note (Signed)
 Recurrent BV.  Patient did not complete MetroGel  after oral metronidazole .  Will recommend again of BV is positive; pending wet prep. Obtained by patient due to her preference.

## 2023-09-09 NOTE — Assessment & Plan Note (Signed)
 Pending A1c today due to patient preference.  Patient is established endocrinology and follow-up scheduled next month.  Defer medication changes to endocrinology

## 2023-09-09 NOTE — Assessment & Plan Note (Signed)
 Pending lipid panel.  Consider changing to another statin due to episodic myalgias on atorvastatin 20 mg every other day.  Consider adjunct with Zetia

## 2023-09-09 NOTE — Patient Instructions (Signed)
Please let me know if vaginal bleeding persists   please call  and schedule your 3D mammogram and /or bone density scan as we discussed.   [facility]  ( new location in 2023)  [address], [place], [place] [address]  [place], [place]  [phone]   I have ordered transvaginal ultrasound.  Let us know if you dont hear back within a week in regards to an appointment being scheduled.   So that you are aware, if you are [facility] MyChart user , please pay attention to your MyChart messages as you may receive a MyChart message with a phone number to call and schedule this test/appointment own your own from our referral coordinator. This is a new process so I do not want you to miss this message.  If you are not a MyChart user, you will receive a phone call.   

## 2023-09-09 NOTE — Progress Notes (Signed)
 Assessment & Plan:  Encounter for screening mammogram for malignant neoplasm of breast -     3D Screening Mammogram, Left and Right; Future  Abnormal laboratory test -     Cervicovaginal ancillary only  Essential hypertension Assessment & Plan: Chronic, stable. Continue amlodipine  5 mg, carvedilol  3.125 mg twice daily, losartan -hydrochlorothiazide 100-25 mg.   Orders: -     CBC with Differential/Platelet -     Comprehensive metabolic panel with GFR -     TSH -     VITAMIN D  25 Hydroxy (Vit-D Deficiency, Fractures) -     Lipid panel -     Microalbumin / creatinine urine ratio  Type 2 diabetes mellitus with ketoacidosis without coma, without long-term current use of insulin  (HCC) Assessment & Plan: Pending A1c today due to patient preference.  Patient is established endocrinology and follow-up scheduled next month.  Defer medication changes to endocrinology  Orders: -     Lipid panel -     Microalbumin / creatinine urine ratio -     Hemoglobin A1c  Vaginal discharge Assessment & Plan: Recurrent BV.  Patient did not complete MetroGel  after oral metronidazole .  Will recommend again of BV is positive; pending wet prep. Obtained by patient due to her preference.    Hyperlipidemia, unspecified hyperlipidemia type Assessment & Plan: Pending lipid panel.  Consider changing to another statin due to episodic myalgias on atorvastatin 20 mg every other day.  Consider adjunct with Zetia      Return precautions given.   Risks, benefits, and alternatives of the medications and treatment plan prescribed today were discussed, and patient expressed understanding.   Education regarding symptom management and diagnosis given to patient on AVS either electronically or printed.  No follow-ups on file.  Bascom Bossier, FNP  Subjective:    Patient ID: Lionel Riddle, female    DOB: 23-Jan-1971, 53 y.o.   MRN: [id number]  CC: NANNIE STARZYK is a 53 y.o. female who presents today for  follow up.   HPI: Complains of vaginal odor for couple of weeks No dysuria, fever, concern STD    H/o recurrent BV; she used metrogel  for one week however didn't complete the course  Compliant with atorvastatin 20mg  every other day Compliant with cipap ; she is wearing 6 hr/night. She is feeling more rested.   Denies CP, sob  A1c due.  Endocrinology f/u 10/01/23  Allergies: Contrast media [iodinated contrast media], Statins, Jardiance  [empagliflozin ], Ozempic (0.25 or 0.5 mg-dose) [semaglutide (0.25 or 0.5mg -dos)], Bactrim  [sulfamethoxazole -trimethoprim ], Penicillins, and Trulicity  [dulaglutide ] Current Outpatient Medications on File Prior to Visit  Medication Sig Dispense Refill   amLODipine  (NORVASC ) 5 MG tablet Take 1 tablet (5 mg total) by mouth daily. 30 tablet 1   B-D UF III MINI PEN NEEDLES 31G X 5 MM MISC Use as directed. 100 each 11   carvedilol  (COREG ) 3.125 MG tablet Take 1 tablet (3.125 mg total) by mouth two (2) times a day with meals. 180 tablet 6   celecoxib  (CELEBREX ) 100 MG capsule Take 1 capsule (100 mg total) by mouth 2 (two) times daily. 60 capsule 2   Continuous Blood Gluc Receiver (FREESTYLE LIBRE 2 READER) DEVI 1 Device by Does not apply route every 14 (fourteen) days. 2 each 0   Continuous Blood Gluc Sensor (FREESTYLE LIBRE 2 SENSOR) MISC Change every 14 days. 6 each 3   LANTUS  SOLOSTAR 100 UNIT/ML Solostar Pen Inject 0.52 mL (52 Units total) under the skin every morning. 15 mL 3  losartan -hydrochlorothiazide (HYZAAR) 100-25 MG tablet Take 1 tablet by mouth daily. 90 tablet 4   metFORMIN  (GLUCOPHAGE -XR) 500 MG 24 hr tablet Take 2 tablets (1,000 mg total) by mouth two (2) times a day in the morning and at bedtime. 120 tablet 3   MOUNJARO 7.5 MG/0.5ML Pen Inject 7.5 mg into the skin once a week.     Multiple Vitamins-Minerals (MULTIVITAMIN WITH MINERALS) tablet Take 1 tablet by mouth daily.     pantoprazole  (PROTONIX ) 40 MG tablet Take 1 tablet (40 mg total) by  mouth daily. 90 tablet 3   pioglitazone  (ACTOS ) 15 MG tablet Take 1 tablet (15 mg total) by mouth daily. Further refills need to come from endocrine. 90 tablet 0   rosuvastatin  (CRESTOR ) 20 MG tablet Take 1 tablet (20 mg total) by mouth daily. 90 tablet 3   scopolamine  (TRANSDERM-SCOP) 1 MG/3DAYS Place 1 patch (1.5 mg total) onto the skin every 3 (three) days. 10 patch 0   gabapentin  (NEURONTIN ) 100 MG capsule Take 1 capsule (100 mg total) by mouth 3 (three) times daily. (Patient not taking: Reported on 09/09/2023) 90 capsule 3   hydrOXYzine  (ATARAX ) 10 MG tablet Take 1 tablet (10 mg total) by mouth 2 (two) times daily as needed for anxiety. (Patient not taking: Reported on 09/09/2023) 30 tablet 0   meloxicam  (MOBIC ) 7.5 MG tablet Take 1 tablet (7.5 mg total) by mouth daily as needed for pain. (Patient not taking: Reported on 09/09/2023) 30 tablet 1   metroNIDAZOLE  (METROGEL ) 0.75 % vaginal gel One application placed intravaginally twice a week for four to six months. (Patient not taking: Reported on 09/09/2023) 70 g 0   No current facility-administered medications on file prior to visit.    Review of Systems  Constitutional:  Negative for chills and fever.  Respiratory:  Negative for cough.   Cardiovascular:  Negative for chest pain and palpitations.  Gastrointestinal:  Negative for nausea and vomiting.  Genitourinary:  Positive for vaginal discharge. Negative for dysuria and vaginal pain.      Objective:    BP 118/70   Pulse 93   Temp 97.7 F (36.5 C) (Oral)   Ht 5' 10 (1.778 m)   Wt 247 lb 12.8 oz (112.4 kg)   LMP  (LMP Unknown)   SpO2 97%   BMI 35.56 kg/m  BP Readings from Last 3 Encounters:  09/09/23 118/70  06/17/23 110/72  04/15/23 128/80   Wt Readings from Last 3 Encounters:  09/09/23 247 lb 12.8 oz (112.4 kg)  06/17/23 251 lb (113.9 kg)  04/15/23 247 lb (112 kg)    Physical Exam Vitals reviewed.  Constitutional:      Appearance: She is well-developed.   Eyes:      Conjunctiva/sclera: Conjunctivae normal.    Cardiovascular:     Rate and Rhythm: Normal rate and regular rhythm.     Pulses: Normal pulses.     Heart sounds: Normal heart sounds.  Pulmonary:     Effort: Pulmonary effort is normal.     Breath sounds: Normal breath sounds. No wheezing, rhonchi or rales.   Skin:    General: Skin is warm and dry.   Neurological:     Mental Status: She is alert.   Psychiatric:        Speech: Speech normal.        Behavior: Behavior normal.        Thought Content: Thought content normal.

## 2023-09-09 NOTE — Assessment & Plan Note (Signed)
 Chronic, stable. Continue amlodipine 5 mg, carvedilol 3.125 mg twice daily, losartan-hydrochlorothiazide 100-25 mg.

## 2023-09-10 LAB — CERVICOVAGINAL ANCILLARY ONLY
Bacterial Vaginitis (gardnerella): POSITIVE — AB
Candida Glabrata: NEGATIVE
Candida Vaginitis: NEGATIVE
Comment: NEGATIVE
Comment: NEGATIVE
Comment: NEGATIVE

## 2023-09-13 ENCOUNTER — Ambulatory Visit: Payer: Self-pay | Admitting: Family

## 2023-09-13 DIAGNOSIS — B9689 Other specified bacterial agents as the cause of diseases classified elsewhere: Secondary | ICD-10-CM

## 2023-09-13 MED ORDER — METRONIDAZOLE 500 MG PO TABS: 500.0000 mg | ORAL_TABLET | Freq: Three times a day (TID) | ORAL | 0 refills | Status: AC

## 2023-09-13 MED ORDER — METRONIDAZOLE 0.75 % VA GEL: VAGINAL | 0 refills | Status: AC

## 2023-09-22 ENCOUNTER — Ambulatory Visit: Admitting: Sleep Medicine

## 2023-10-03 ENCOUNTER — Other Ambulatory Visit: Payer: Self-pay | Admitting: Family

## 2023-10-06 ENCOUNTER — Encounter: Payer: Self-pay | Admitting: Sleep Medicine

## 2023-10-06 ENCOUNTER — Ambulatory Visit: Admitting: Sleep Medicine

## 2023-10-06 VITALS — BP 130/80 | HR 89 | Temp 98.1°F | Ht 70.0 in | Wt 245.0 lb

## 2023-10-06 DIAGNOSIS — Z6835 Body mass index (BMI) 35.0-35.9, adult: Secondary | ICD-10-CM

## 2023-10-06 DIAGNOSIS — F17210 Nicotine dependence, cigarettes, uncomplicated: Secondary | ICD-10-CM

## 2023-10-06 DIAGNOSIS — I10 Essential (primary) hypertension: Secondary | ICD-10-CM | POA: Diagnosis not present

## 2023-10-06 DIAGNOSIS — G4733 Obstructive sleep apnea (adult) (pediatric): Secondary | ICD-10-CM | POA: Diagnosis not present

## 2023-10-06 NOTE — Patient Instructions (Addendum)

## 2023-10-06 NOTE — Progress Notes (Unsigned)
 Name:Julia Brown MRN: [id number] DOB: 1971-01-16   CHIEF COMPLAINT:  CPAP F/U   HISTORY OF PRESENT ILLNESS:  Julia Brown is a 53 y.o. w/ a h/o OSA, HTN, DMII, GERD, hyperlipidemia and obesity who presents for CPAP f/u visit. Reports using CPAP therapy every night until there was an issue with the machine turning on. States that her machine now works but has lost an attachment for her mask and would like to resume use. Reports that she was feeling significantly more refreshed upon awakening with CPAP therapy.    EPWORTH SLEEP SCORE    06/17/2023    3:00 PM 07/15/2021    4:00 PM  Results of the Epworth flowsheet  Sitting and reading 1 1  Watching TV 1 1  Sitting, inactive in a public place (e.g. a theatre or a meeting) 0 0  As a passenger in a car for an hour without a break 0 0  Lying down to rest in the afternoon when circumstances permit 0 0  Sitting and talking to someone 0 0  Sitting quietly after a lunch without alcohol 0 0  In a car, while stopped for a few minutes in traffic 0 0  Total score 2 2     PAST MEDICAL HISTORY :   has a past medical history of Chicken pox, Diabetes mellitus, GERD (gastroesophageal reflux disease), Hyperlipidemia, Hypertension, and UTI (lower urinary tract infection).  has a past surgical history that includes Tubal ligation; uterine ablation; Cholecystectomy (1993); Cesarean section (1994); Ablation (2007); ORIF femur fracture (Right); Esophagogastroduodenoscopy (N/A, 06/22/2020); and Colonoscopy with propofol  (N/A, 07/25/2020). Prior to Admission medications   Medication Sig Start Date End Date Taking? Authorizing Provider  amLODipine  (NORVASC ) 5 MG tablet Take 1 tablet (5 mg total) by mouth daily. 10/04/23   Dineen Rollene MATSU, FNP  B-D UF III MINI PEN NEEDLES 31G X 5 MM MISC Use as directed. 12/08/22   Dineen Rollene MATSU, FNP  carvedilol  (COREG ) 3.125 MG tablet Take 1 tablet (3.125 mg total) by mouth two (2) times a day with meals.  04/21/23   Dineen Rollene MATSU, FNP  celecoxib  (CELEBREX ) 100 MG capsule Take 1 capsule (100 mg total) by mouth 2 (two) times daily. 08/04/23   Dineen Rollene MATSU, FNP  Continuous Blood Gluc Receiver (FREESTYLE LIBRE 2 READER) DEVI 1 Device by Does not apply route every 14 (fourteen) days. 07/26/20   McLean-Scocuzza, Randine SAILOR, MD  Continuous Blood Gluc Sensor (FREESTYLE LIBRE 2 SENSOR) MISC Change every 14 days. 07/03/22   Dineen Rollene MATSU, FNP  gabapentin  (NEURONTIN ) 100 MG capsule Take 1 capsule (100 mg total) by mouth 3 (three) times daily. Patient not taking: Reported on 09/09/2023 01/08/23   Dineen Rollene MATSU, FNP  hydrOXYzine  (ATARAX ) 10 MG tablet Take 1 tablet (10 mg total) by mouth 2 (two) times daily as needed for anxiety. Patient not taking: Reported on 09/09/2023 01/08/23   Dineen Rollene MATSU, FNP  LANTUS  SOLOSTAR 100 UNIT/ML Solostar Pen Inject 0.52 mL (52 Units total) under the skin every morning. 05/24/23   Dineen Rollene MATSU, FNP  losartan -hydrochlorothiazide (HYZAAR) 100-25 MG tablet Take 1 tablet by mouth daily. 09/07/22   Dineen Rollene MATSU, FNP  meloxicam  (MOBIC ) 7.5 MG tablet Take 1 tablet (7.5 mg total) by mouth daily as needed for pain. Patient not taking: Reported on 09/09/2023 01/08/23   Dineen Rollene MATSU, FNP  metFORMIN  (GLUCOPHAGE -XR) 500 MG 24 hr tablet Take 2 tablets (1,000 mg total) by mouth two (  2) times a day in the morning and at bedtime. 03/10/23   Dineen Rollene MATSU, FNP  metroNIDAZOLE  (METROGEL ) 0.75 % vaginal gel One application placed intravaginally twice a week for four to six months. 09/13/23   Dineen Rollene MATSU, FNP  MOUNJARO 7.5 MG/0.5ML Pen Inject 7.5 mg into the skin once a week. 05/21/23   [provider]  Multiple Vitamins-Minerals (MULTIVITAMIN WITH MINERALS) tablet Take 1 tablet by mouth daily.    [provider]  pantoprazole  (PROTONIX ) 40 MG tablet Take 1 tablet (40 mg total) by mouth daily. 06/21/23   Dineen Rollene MATSU, FNP  pioglitazone   (ACTOS ) 15 MG tablet Take 1 tablet (15 mg total) by mouth daily. Further refills need to come from endocrine. 07/19/23   Dineen Rollene MATSU, FNP  rosuvastatin  (CRESTOR ) 20 MG tablet Take 1 tablet (20 mg total) by mouth daily. 06/07/23   Dineen Rollene MATSU, FNP  scopolamine  (TRANSDERM-SCOP) 1 MG/3DAYS Place 1 patch (1.5 mg total) onto the skin every 3 (three) days. 07/14/21   Dineen Rollene MATSU, FNP   Allergies  Allergen Reactions   Contrast Media [Iodinated Contrast Media] Anaphylaxis    Wheezing and rash immediately after CT w/.   Statins Palpitations   Jardiance  [Empagliflozin ]     DKA hospitalized 05/2020   Ozempic (0.25 Or 0.5 Mg-Dose) [Semaglutide (0.25 Or 0.5mg -Dos)] Nausea Only   Bactrim  [Sulfamethoxazole -Trimethoprim ] Itching    itching   Penicillins Rash   Trulicity  [Dulaglutide ] Nausea Only    FAMILY HISTORY:  family history includes Cancer in her maternal aunt; Depression in her mother; Diabetes in her father, maternal grandfather, maternal grandmother, and mother; Heart disease in her father and another family member; Hyperlipidemia in her father and mother; Hypertension in her mother; Schizophrenia in her mother. SOCIAL HISTORY:  reports that she has been smoking cigarettes. She has a 7.5 pack-year smoking history. She has never used smokeless tobacco. She reports current alcohol use. She reports that she does not use drugs.   Review of Systems:  Gen:  Denies  fever, sweats, chills weight loss  HEENT: Denies blurred vision, double vision, ear pain, eye pain, hearing loss, nose bleeds, sore throat Cardiac:  No dizziness, chest pain or heaviness, chest tightness,edema, No JVD Resp:   No cough, -sputum production, -shortness of breath,-wheezing, -hemoptysis,  Gi: Denies swallowing difficulty, stomach pain, nausea or vomiting, diarrhea, constipation, bowel incontinence Gu:  Denies bladder incontinence, burning urine Ext:   Denies Joint pain, stiffness or swelling Skin: Denies   skin rash, easy bruising or bleeding or hives Endoc:  Denies polyuria, polydipsia , polyphagia or weight change Psych:   Denies depression, insomnia or hallucinations  Other:  All other systems negative  VITAL SIGNS: Ht 5' 10 (1.778 m)   Wt 245 lb (111.1 kg)   LMP  (LMP Unknown)   BMI 35.15 kg/m     Physical Examination:   General Appearance: No distress  EYES PERRLA, EOM intact.   NECK Supple, No JVD Pulmonary: normal breath sounds, No wheezing.  CardiovascularNormal S1,S2.  No m/r/g.   Abdomen: Benign, Soft, non-tender. Skin:   warm, no rashes, no ecchymosis  Extremities: normal, no cyanosis, clubbing. Neuro:without focal findings,  speech normal  PSYCHIATRIC: Mood, affect within normal limits.   ASSESSMENT AND PLAN  OSA I suspect that OSA is likely present due to clinical presentation. Discussed the consequences of untreated sleep apnea. Advised not to drive drowsy for safety of patient and others. Will complete further evaluation with a home sleep study and  follow up to review results.    HTN Stable, on current management. Following with PCP.   Morbid obesity Counseled patient on diet and lifestyle modification.   MEDICATION ADJUSTMENTS/LABS AND TESTS ORDERED: Recommend Sleep Study   Patient  satisfied with Plan of action and management. All questions answered  Follow up to review HST results and treatment plan.   I spent a total of 30 minutes reviewing chart data, face-to-face evaluation with the patient, counseling and coordination of care as detailed above.    Reshunda Strider, M.D.  Sleep Medicine [facility]

## 2023-10-18 ENCOUNTER — Other Ambulatory Visit: Payer: Self-pay | Admitting: Family

## 2023-10-18 DIAGNOSIS — E1110 Type 2 diabetes mellitus with ketoacidosis without coma: Secondary | ICD-10-CM

## 2023-11-25 ENCOUNTER — Other Ambulatory Visit: Payer: Self-pay | Admitting: Family

## 2023-11-25 DIAGNOSIS — E1110 Type 2 diabetes mellitus with ketoacidosis without coma: Secondary | ICD-10-CM

## 2023-11-25 DIAGNOSIS — E119 Type 2 diabetes mellitus without complications: Secondary | ICD-10-CM

## 2023-11-29 ENCOUNTER — Other Ambulatory Visit: Payer: Self-pay | Admitting: Family

## 2023-11-30 NOTE — Telephone Encounter (Signed)
 Lvm notify pt that medication needs to be filled by endo.

## 2023-12-27 ENCOUNTER — Other Ambulatory Visit: Payer: Self-pay | Admitting: Family

## 2024-01-06 ENCOUNTER — Ambulatory Visit: Admitting: Sleep Medicine

## 2024-01-23 ENCOUNTER — Other Ambulatory Visit: Payer: Self-pay | Admitting: Family

## 2024-02-07 LAB — CBC AND DIFFERENTIAL: Hemoglobin: 6.9 — AB (ref 12.0–16.0)

## 2024-03-23 ENCOUNTER — Other Ambulatory Visit: Payer: Self-pay | Admitting: Family

## 2024-04-13 ENCOUNTER — Ambulatory Visit

## 2024-04-13 ENCOUNTER — Encounter: Payer: Self-pay | Admitting: Family

## 2024-04-13 ENCOUNTER — Ambulatory Visit: Admitting: Family

## 2024-04-13 ENCOUNTER — Ambulatory Visit: Payer: Self-pay | Admitting: Family

## 2024-04-13 VITALS — BP 122/84 | HR 86 | Temp 98.3°F | Ht 70.0 in | Wt 226.4 lb

## 2024-04-13 DIAGNOSIS — I10 Essential (primary) hypertension: Secondary | ICD-10-CM

## 2024-04-13 DIAGNOSIS — G4733 Obstructive sleep apnea (adult) (pediatric): Secondary | ICD-10-CM | POA: Diagnosis not present

## 2024-04-13 DIAGNOSIS — M545 Low back pain, unspecified: Secondary | ICD-10-CM | POA: Diagnosis not present

## 2024-04-13 DIAGNOSIS — H93A9 Pulsatile tinnitus, unspecified ear: Secondary | ICD-10-CM

## 2024-04-13 DIAGNOSIS — J40 Bronchitis, not specified as acute or chronic: Secondary | ICD-10-CM | POA: Diagnosis not present

## 2024-04-13 DIAGNOSIS — F325 Major depressive disorder, single episode, in full remission: Secondary | ICD-10-CM

## 2024-04-13 DIAGNOSIS — G8929 Other chronic pain: Secondary | ICD-10-CM

## 2024-04-13 DIAGNOSIS — Z0000 Encounter for general adult medical examination without abnormal findings: Secondary | ICD-10-CM

## 2024-04-13 LAB — COMPREHENSIVE METABOLIC PANEL WITH GFR
ALT: 36 U/L — ABNORMAL HIGH (ref 3–35)
AST: 31 U/L (ref 5–37)
Albumin: 4.5 g/dL (ref 3.5–5.2)
Alkaline Phosphatase: 74 U/L (ref 39–117)
BUN: 18 mg/dL (ref 6–23)
CO2: 27 meq/L (ref 19–32)
Calcium: 9.7 mg/dL (ref 8.4–10.5)
Chloride: 100 meq/L (ref 96–112)
Creatinine, Ser: 1.12 mg/dL (ref 0.40–1.20)
GFR: 56.39 mL/min — ABNORMAL LOW
Glucose, Bld: 165 mg/dL — ABNORMAL HIGH (ref 70–99)
Potassium: 4.7 meq/L (ref 3.5–5.1)
Sodium: 137 meq/L (ref 135–145)
Total Bilirubin: 0.5 mg/dL (ref 0.2–1.2)
Total Protein: 7.3 g/dL (ref 6.0–8.3)

## 2024-04-13 MED ORDER — BENZONATATE 100 MG PO CAPS: 100.0000 mg | ORAL_CAPSULE | Freq: Three times a day (TID) | ORAL | 1 refills | Status: AC | PRN

## 2024-04-13 MED ORDER — DOXYCYCLINE HYCLATE 100 MG PO TABS: 100.0000 mg | ORAL_TABLET | Freq: Two times a day (BID) | ORAL | 0 refills | Status: AC

## 2024-04-13 NOTE — Assessment & Plan Note (Addendum)
 Discussed chronic non painful pulsatile sensation right side at the end of visit.  Advised patient this most certainly warrants a full discussion and evaluation.  Advised this would require MRA head, MRI brain.  She will make follow-up in 1 month to discuss

## 2024-04-13 NOTE — Progress Notes (Signed)
 "  Assessment & Plan:  Routine general medical examination at a health care facility Assessment & Plan: Clinical breast exam performed.  Deferred pelvic exam in the absence of complaints of Pap smear is up-to-date.  Referral to CT lung cancer screening program.  Patient will schedule mammogram  Orders: -     Ambulatory Referral for Lung Cancer Scre -     Comprehensive metabolic panel with GFR -     Pulmonary Visit  Bronchitis Assessment & Plan: Symptoms approximately 9 days in duration.  Discussed more likely viral etiology.  Advised over-the-counter antihistamine, tessalon .  If symptoms persist, especially as we expect winter weather over the weekend, I did provide prescription for doxycycline  to start if symptoms persist or certainly get worse.  She understands to take this medication with probiotics as well.  Orders: -     Doxycycline  Hyclate; Take 1 tablet (100 mg total) by mouth 2 (two) times daily for 7 days.  Dispense: 14 tablet; Refill: 0 -     Benzonatate ; Take 1 capsule (100 mg total) by mouth 3 (three) times daily as needed for cough.  Dispense: 20 capsule; Refill: 1  Chronic midline low back pain, unspecified whether sciatica present Assessment & Plan: Chronic, suboptimal control.  Discussed risk of long-term use of Celebrex .  Reviewed lumbar x-ray obtained by orthopedics.  Pending physical therapy, trial Cymbalta  30 mg daily.  Close f/u  Orders: -     Ambulatory referral to Physical Therapy  Pulsatile tinnitus Assessment & Plan: Discussed chronic non painful pulsatile sensation right side at the end of visit.  Advised patient this most certainly warrants a full discussion and evaluation.  Advised this would require MRA head, MRI brain.  She will make follow-up in 1 month to discuss   Essential hypertension Assessment & Plan: Chronic, stable. Continue amlodipine  5 mg, carvedilol  3.125 mg twice daily, losartan -hydrochlorothiazide 100-25 mg.    OSA (obstructive sleep  apnea) Assessment & Plan: She has been lost to follow-up for sleep apnea.  Referral back to pulmonology.  Discussed risks of not treating sleep apnea including stroke, arrhythmia, heart attack, fatigue   Depression, major, single episode, complete remission Assessment & Plan: Chronic, suboptimal control.  Complicated by perimenopause, chronic low back pain.  Start Cymbalta  30 mg and titrate      Return precautions given.   Risks, benefits, and alternatives of the medications and treatment plan prescribed today were discussed, and patient expressed understanding.   Education regarding symptom management and diagnosis given to patient on AVS either electronically or printed.  Return in about 6 weeks (around 05/25/2024).  Rollene Northern, FNP  Subjective:    Patient ID: Julia Brown, female    DOB: Jan 08, 1971, 54 y.o.   MRN: [id number]  CC: Julia Brown is a 54 y.o. female who presents today for physical exam.    HPI: HPI Discussed the use of AI scribe software for clinical note transcription with the patient, who gave verbal consent to proceed.  History of Present Illness   Julia Brown is a 54 year old female who presents with symptoms of perimenopause and chronic low back pain.  She reports hot flashes, irritability, and significant sleep disturbances. Sleep is fragmented, with only four to five hours per night, and difficulty returning to sleep once awakened. Menstrual periods have become sporadic and very light. Increased moodiness and lack of energy are impacting daily activities.  She has a history of chronic low back pain and reports that her work  involves standing and lifting. By the end of her workday, she is nearly in tears due to the pain. Denies numbness, groin pain, or history of trauma.   Previously on gabapentin , discontinued due to side effects of grogginess and a 'hangover' sensation. Currently taking Celebrex  daily for pain management.  She reports nasal  congestion and a cough, which began just prior to her visit to urgent care on April 04, 2024. Nasal congestion is clear and runny, with a cough producing white, non-colored mucus. Denies fever, chest pain, or shortness of breath. Has not started any antihistamines or decongestants.  She has a history of sleep apnea but has not been using her CPAP machine due to insurance issues.    Denies unusual weight loss, fever, chills.  Sheets are not drenched in sweat. No groin pain No fall, trauma  04/04/24 UC for URI  Following with [facility] orthopedic Xr lumbar 12/2022 Minimal to grade 1 anterolisthesis of L4 on L5 and L5 on S1. Minimal anterior osteophytosis. No obvious discogenic disease.   Colorectal Cancer Screening: UTD , 07/25/2020, repeat in 10-years Breast Cancer Screening: Mammogram scheduled  Cervical Cancer Screening: UTD ; Negative HPV, negative malignancy 03/20/2022 Bone Health screening/DEXA for 65+: No increased fracture risk. Defer screening at this time.  Lung Cancer Screening: She qualifies  No family history of AAA.        Tetanus - UTD        Pneumococcal - Candidate for. declines  Exercise: Gets regular exercise.   Alcohol use: Occassional Smoking/tobacco use: smoker.    Health Maintenance  Topic Date Due   Hepatitis B Vaccine (1 of 3 - 19+ 3-dose series) Never done   Pneumococcal Vaccine for age over 86 (2 of 2 - PCV) 04/23/2010   Zoster (Shingles) Vaccine (1 of 2) Never done   Breast Cancer Screening  07/18/2023   Eye exam for diabetics  08/05/2023   COVID-19 Vaccine (4 - 2025-26 season) 11/22/2023   Hemoglobin A1C  03/10/2024   Flu Shot  06/20/2024*   Yearly kidney function blood test for diabetes  09/08/2024   Kidney health urinalysis for diabetes  09/08/2024   Complete foot exam   09/08/2024   Pap with HPV screening  03/21/2027   DTaP/Tdap/Td vaccine (3 - Td or Tdap) 08/22/2027   Colon Cancer Screening  07/26/2030   HPV Vaccine (No Doses Required)  Completed   Hepatitis C Screening  Completed   HIV Screening  Completed   Meningitis B Vaccine  Aged Out  *Topic was postponed. The date shown is not the original due date.    ALLERGIES: Contrast media [iodinated contrast media], Statins, Jardiance  [empagliflozin ], Ozempic (0.25 or 0.5 mg-dose) [semaglutide (0.25 or 0.5mg -dos)], Bactrim  [sulfamethoxazole -trimethoprim ], Penicillins, and Trulicity  [dulaglutide ]  Medications Ordered Prior to Encounter[1]  Review of Systems  Constitutional:  Negative for chills and fever.  HENT:  Positive for congestion and sinus pressure. Negative for hearing loss, postnasal drip, rhinorrhea and sinus pain.   Eyes:  Negative for visual disturbance.  Respiratory:  Negative for cough.   Cardiovascular:  Negative for chest pain and palpitations.  Gastrointestinal:  Negative for nausea and vomiting.  Neurological:  Negative for headaches.      Objective:    BP 122/84   Pulse 86   Temp 98.3 F (36.8 C) (Oral)   Ht 5' 10 (1.778 m)   Wt 226 lb 6.4 oz (102.7 kg)   LMP  (LMP Unknown)   SpO2 97%   BMI 32.49 kg/m  BP Readings from Last 3 Encounters:  04/13/24 122/84  10/06/23 130/80  09/09/23 118/70   Wt Readings from Last 3 Encounters:  04/13/24 226 lb 6.4 oz (102.7 kg)  10/06/23 245 lb (111.1 kg)  09/09/23 247 lb 12.8 oz (112.4 kg)    Physical Exam Vitals reviewed.  Constitutional:      Appearance: Normal appearance. She is well-developed.  HENT:     Head: Normocephalic and atraumatic.     Right Ear: Hearing, tympanic membrane, ear canal and external ear normal. No decreased hearing noted. No drainage, swelling or tenderness. No middle ear effusion. No foreign body. Tympanic membrane is not erythematous or bulging.     Left Ear: Hearing, tympanic membrane, ear canal and external ear normal. No decreased hearing noted. No drainage, swelling or tenderness.  No middle ear effusion. No foreign body. Tympanic membrane is not erythematous or  bulging.     Nose: Nose normal. No rhinorrhea.     Right Sinus: No maxillary sinus tenderness or frontal sinus tenderness.     Left Sinus: No maxillary sinus tenderness or frontal sinus tenderness.     Mouth/Throat:     Pharynx: Uvula midline. No oropharyngeal exudate or posterior oropharyngeal erythema.     Tonsils: No tonsillar abscesses.  Eyes:     Conjunctiva/sclera: Conjunctivae normal.  Neck:     Thyroid : No thyroid  mass or thyromegaly.     Vascular: No carotid bruit.  Cardiovascular:     Rate and Rhythm: Normal rate and regular rhythm.     Pulses: Normal pulses.     Heart sounds: Normal heart sounds.  Pulmonary:     Effort: Pulmonary effort is normal.     Breath sounds: Normal breath sounds. No wheezing, rhonchi or rales.  Chest:  Breasts:    Breasts are symmetrical.     Right: No inverted nipple, mass, nipple discharge, skin change or tenderness.     Left: No inverted nipple, mass, nipple discharge, skin change or tenderness.  Abdominal:     General: Bowel sounds are normal. There is no distension.     Palpations: Abdomen is soft. Abdomen is not rigid. There is no fluid wave or mass.     Tenderness: There is no abdominal tenderness. There is no guarding or rebound.  Lymphadenopathy:     Head:     Right side of head: No submental, submandibular, tonsillar, preauricular, posterior auricular or occipital adenopathy.     Left side of head: No submental, submandibular, tonsillar, preauricular, posterior auricular or occipital adenopathy.     Cervical: No cervical adenopathy.     Right cervical: No superficial, deep or posterior cervical adenopathy.    Left cervical: No superficial, deep or posterior cervical adenopathy.  Skin:    General: Skin is warm and dry.  Neurological:     Mental Status: She is alert.  Psychiatric:        Speech: Speech normal.        Behavior: Behavior normal.        Thought Content: Thought content normal.           [1]  Current Outpatient  Medications on File Prior to Visit  Medication Sig Dispense Refill   amLODipine  (NORVASC ) 5 MG tablet Take 1 tablet (5 mg total) by mouth daily. 90 tablet 1   BD PEN NEEDLE MINI ULTRAFINE 31G X 5 MM MISC Use as directed. 100 each 11   carvedilol  (COREG ) 3.125 MG tablet Take 1 tablet (3.125 mg total) by  mouth two (2) times a day with meals. 180 tablet 6   celecoxib  (CELEBREX ) 100 MG capsule Take 1 capsule (100 mg total) by mouth two (2) times a day. 60 capsule 2   Continuous Glucose Sensor (FREESTYLE LIBRE 3 PLUS SENSOR) MISC by Does not apply route. Change sensor every 15 days.     LANTUS  SOLOSTAR 100 UNIT/ML Solostar Pen Inject 0.52 mL (52 Units total) under the skin every morning. 15 mL 3   losartan -hydrochlorothiazide (HYZAAR) 100-25 MG tablet Take 1 tablet by mouth daily. 90 tablet 1   metFORMIN  (GLUCOPHAGE -XR) 500 MG 24 hr tablet Take 2 tablets (1,000 mg total) by mouth two (2) times a day in the morning and at bedtime. 120 tablet 3   metroNIDAZOLE  (METROGEL ) 0.75 % vaginal gel One application placed intravaginally twice a week for four to six months. 70 g 0   MOUNJARO 7.5 MG/0.5ML Pen Inject 7.5 mg into the skin once a week.     Multiple Vitamins-Minerals (MULTIVITAMIN WITH MINERALS) tablet Take 1 tablet by mouth daily.     pantoprazole  (PROTONIX ) 40 MG tablet Take 1 tablet (40 mg total) by mouth daily. 90 tablet 3   pioglitazone  (ACTOS ) 15 MG tablet Take 1 tablet (15 mg total) by mouth daily. Further refills need to come from endocrine. 90 tablet 0   rosuvastatin  (CRESTOR ) 20 MG tablet Take 1 tablet (20 mg total) by mouth daily. 90 tablet 3   hydrOXYzine  (ATARAX ) 10 MG tablet Take 1 tablet (10 mg total) by mouth 2 (two) times daily as needed for anxiety. (Patient not taking: Reported on 04/13/2024) 30 tablet 0   No current facility-administered medications on file prior to visit.   "

## 2024-04-13 NOTE — Assessment & Plan Note (Signed)
 Clinical breast exam performed.  Deferred pelvic exam in the absence of complaints of Pap smear is up-to-date.  Referral to CT lung cancer screening program.  Patient will schedule mammogram

## 2024-04-13 NOTE — Patient Instructions (Addendum)
 Start cymbalta  30mg  , after 3-4 weeks, you may increase to 60mg  total daily dose ( two of the 30mg  tablets together) if needed.   Referral to [facility] physical therapy  Start antihistamine such claritin  Start doxycycline  ( antibiotic) ONLY if symptoms worsen.  Ensure to take probiotics while on antibiotics and also for 2 weeks after completion. This can either be by eating yogurt daily or taking a probiotic supplement over the counter such as Culturelle.It is important to re-colonize the gut with good bacteria and also to prevent any diarrheal infections associated with antibiotic use.    Referral to pulmonology for sleep apnea follow up  You are due for annual lung cancer screening program.    Previously the program had been managed under [facility] hematology/oncology, now it has been moved to [facility] pulmonology .    I have placed a referral to [facility] pulmonology  and their office will reach out to you to schedule your CT of your chest.  They will reach out to you annually going forward.  If you do not hear from their office in the next 1 to 2 weeks, please call [facility]mon[facility] at [phone]   So let me know if there are any issues in getting scheduled.   Health Maintenance, Female Adopting a healthy lifestyle and getting preventive care are important in promoting health and wellness. Ask your health care provider about: The right schedule for you to have regular tests and exams. Things you can do on your own to prevent diseases and keep yourself healthy. What should I know about diet, weight, and exercise? Eat a healthy diet  Eat a diet that includes plenty of vegetables, fruits, low-fat dairy products, and lean protein. Do not eat a lot of foods that are high in solid fats, added sugars, or sodium. Maintain a healthy weight Body mass index (BMI) is used to identify weight problems. It estimates body fat based on height and weight. Your health care provider can  help determine your BMI and help you achieve or maintain a healthy weight. Get regular exercise Get regular exercise. This is one of the most important things you can do for your health. Most adults should: Exercise for at least 150 minutes each week. The exercise should increase your heart rate and make you sweat (moderate-intensity exercise). Do strengthening exercises at least twice a week. This is in addition to the moderate-intensity exercise. Spend less time sitting. Even light physical activity can be beneficial. Watch cholesterol and blood lipids Have your blood tested for lipids and cholesterol at 53 years of age, then have this test every 5 years. Have your cholesterol levels checked more often if: Your lipid or cholesterol levels are high. You are older than 53 years of age. You are at high risk for heart disease. What should I know about cancer screening? Depending on your health history and family history, you may need to have cancer screening at various ages. This may include screening for: Breast cancer. Cervical cancer. Colorectal cancer. Skin cancer. Lung cancer. What should I know about heart disease, diabetes, and high blood pressure? Blood pressure and heart disease High blood pressure causes heart disease and increases the risk of stroke. This is more likely to develop in people who have high blood pressure readings or are overweight. Have your blood pressure checked: Every 3-5 years if you are 4-58 years of age. Every year if you are 2 years old or older. Diabetes Have regular diabetes  screenings. This checks your fasting blood sugar level. Have the screening done: Once every three years after age 60 if you are at a normal weight and have a low risk for diabetes. More often and at a younger age if you are overweight or have a high risk for diabetes. What should I know about preventing infection? Hepatitis B If you have a higher risk for hepatitis B, you should  be screened for this virus. Talk with your health care provider to find out if you are at risk for hepatitis B infection. Hepatitis C Testing is recommended for: Everyone born from [birth year] through 1964. Anyone with known risk factors for hepatitis C. Sexually transmitted infections (STIs) Get screened for STIs, including gonorrhea and chlamydia, if: You are sexually active and are younger than 53 years of age. You are older than 53 years of age and your health care provider tells you that you are at risk for this type of infection. Your sexual activity has changed since you were last screened, and you are at increased risk for chlamydia or gonorrhea. Ask your health care provider if you are at risk. Ask your health care provider about whether you are at high risk for HIV. Your health care provider may recommend a prescription medicine to help prevent HIV infection. If you choose to take medicine to prevent HIV, you should first get tested for HIV. You should then be tested every 3 months for as long as you are taking the medicine. Pregnancy If you are about to stop having your period (premenopausal) and you may become pregnant, seek counseling before you get pregnant. Take 400 to 800 micrograms (mcg) of folic acid  every day if you become pregnant. Ask for birth control (contraception) if you want to prevent pregnancy. Osteoporosis and menopause Osteoporosis is a disease in which the bones lose minerals and strength with aging. This can result in bone fractures. If you are 2 years old or older, or if you are at risk for osteoporosis and fractures, ask your health care provider if you should: Be screened for bone loss. Take a calcium  or vitamin D  supplement to lower your risk of fractures. Be given hormone replacement therapy (HRT) to treat symptoms of menopause. Follow these instructions at home: Alcohol use Do not drink alcohol if: Your health care provider tells you not to drink. You are  pregnant, may be pregnant, or are planning to become pregnant. If you drink alcohol: Limit how much you have to: 0-1 drink a day. Know how much alcohol is in your drink. In the [place] one drink equals one 12 oz bottle of beer (355 mL), one 5 oz glass of wine (148 mL), or one 1 oz glass of hard liquor (44 mL). Lifestyle Do not use any products that contain nicotine or tobacco. These products include cigarettes, chewing tobacco, and vaping devices, such as e-cigarettes. If you need help quitting, ask your health care provider. Do not use street drugs. Do not share needles. Ask your health care provider for help if you need support or information about quitting drugs. General instructions Schedule regular health, dental, and eye exams. Stay current with your vaccines. Tell your health care provider if: You often feel depressed. You have ever been abused or do not feel safe at home. Summary Adopting a healthy lifestyle and getting preventive care are important in promoting health and wellness. Follow your health care provider's instructions about healthy diet, exercising, and getting tested or screened for diseases. Follow your health  care provider's instructions on monitoring your cholesterol and blood pressure. This information is not intended to replace advice given to you by your health care provider. Make sure you discuss any questions you have with your health care provider. Document Revised: 07/29/2020 Document Reviewed: 07/29/2020 Elsevier Patient Education  2024 [employer].

## 2024-04-13 NOTE — Assessment & Plan Note (Signed)
 Symptoms approximately 9 days in duration.  Discussed more likely viral etiology.  Advised over-the-counter antihistamine, tessalon .  If symptoms persist, especially as we expect winter weather over the weekend, I did provide prescription for doxycycline  to start if symptoms persist or certainly get worse.  She understands to take this medication with probiotics as well.

## 2024-04-13 NOTE — Assessment & Plan Note (Signed)
 Chronic, suboptimal control.  Discussed risk of long-term use of Celebrex .  Reviewed lumbar x-ray obtained by orthopedics.  Pending physical therapy, trial Cymbalta  30 mg daily.  Close f/u

## 2024-04-13 NOTE — Assessment & Plan Note (Signed)
 Chronic, stable. Continue amlodipine 5 mg, carvedilol 3.125 mg twice daily, losartan-hydrochlorothiazide 100-25 mg.

## 2024-04-13 NOTE — Assessment & Plan Note (Signed)
 Chronic, suboptimal control.  Complicated by perimenopause, chronic low back pain.  Start Cymbalta  30 mg and titrate

## 2024-04-13 NOTE — Assessment & Plan Note (Signed)
 She has been lost to follow-up for sleep apnea.  Referral back to pulmonology.  Discussed risks of not treating sleep apnea including stroke, arrhythmia, heart attack, fatigue

## 2024-04-18 ENCOUNTER — Other Ambulatory Visit: Payer: Self-pay | Admitting: Family

## 2024-04-18 ENCOUNTER — Encounter: Payer: Self-pay | Admitting: Family

## 2024-04-18 MED ORDER — DULOXETINE HCL 30 MG PO CPEP: 30.0000 mg | ORAL_CAPSULE | Freq: Every day | ORAL | 3 refills | Status: AC

## 2024-04-18 NOTE — Telephone Encounter (Signed)
Pt has been notified that rx has been sent in.

## 2024-04-27 ENCOUNTER — Other Ambulatory Visit

## 2024-04-28 ENCOUNTER — Encounter: Payer: Self-pay | Admitting: Sleep Medicine

## 2024-05-25 ENCOUNTER — Ambulatory Visit: Admitting: Family
